# Patient Record
Sex: Female | Born: 1955
Health system: Southern US, Community
[De-identification: ages and names within clinical notes are randomized; demographics above are authoritative.]

## PROBLEM LIST (undated history)

## (undated) DIAGNOSIS — N950 Postmenopausal bleeding: Secondary | ICD-10-CM

## (undated) DIAGNOSIS — J309 Allergic rhinitis, unspecified: Secondary | ICD-10-CM

## (undated) DIAGNOSIS — I10 Essential (primary) hypertension: Secondary | ICD-10-CM

## (undated) DIAGNOSIS — M5137 Other intervertebral disc degeneration, lumbosacral region: Secondary | ICD-10-CM

## (undated) DIAGNOSIS — D649 Anemia, unspecified: Secondary | ICD-10-CM

## (undated) DIAGNOSIS — R112 Nausea with vomiting, unspecified: Secondary | ICD-10-CM

## (undated) DIAGNOSIS — M51379 Other intervertebral disc degeneration, lumbosacral region without mention of lumbar back pain or lower extremity pain: Secondary | ICD-10-CM

## (undated) DIAGNOSIS — F32A Depression, unspecified: Secondary | ICD-10-CM

## (undated) DIAGNOSIS — F329 Major depressive disorder, single episode, unspecified: Secondary | ICD-10-CM

## (undated) DIAGNOSIS — R9431 Abnormal electrocardiogram [ECG] [EKG]: Secondary | ICD-10-CM

## (undated) DIAGNOSIS — C541 Malignant neoplasm of endometrium: Secondary | ICD-10-CM

## (undated) DIAGNOSIS — J45909 Unspecified asthma, uncomplicated: Secondary | ICD-10-CM

## (undated) DIAGNOSIS — H409 Unspecified glaucoma: Secondary | ICD-10-CM

## (undated) DIAGNOSIS — E785 Hyperlipidemia, unspecified: Secondary | ICD-10-CM

## (undated) DIAGNOSIS — M5412 Radiculopathy, cervical region: Secondary | ICD-10-CM

## (undated) DIAGNOSIS — K219 Gastro-esophageal reflux disease without esophagitis: Secondary | ICD-10-CM

## (undated) DIAGNOSIS — Z9889 Other specified postprocedural states: Secondary | ICD-10-CM

## (undated) DIAGNOSIS — M549 Dorsalgia, unspecified: Secondary | ICD-10-CM

## (undated) HISTORY — DX: Essential (primary) hypertension: I10

## (undated) HISTORY — DX: Depression, unspecified: F32.A

## (undated) HISTORY — PX: KNEE ARTHROSCOPY: SUR90

## (undated) HISTORY — PX: ANKLE SURGERY: SHX546

## (undated) HISTORY — PX: CERVICAL SPINE SURGERY: SHX589

## (undated) HISTORY — DX: Major depressive disorder, single episode, unspecified: F32.9

## (undated) HISTORY — DX: Anemia, unspecified: D64.9

## (undated) HISTORY — PX: BIOPSY BREAST: PRO8

## (undated) HISTORY — DX: Abnormal electrocardiogram (ECG) (EKG): R94.31

## (undated) HISTORY — PX: OTHER SURGICAL HISTORY: SHX169

## (undated) HISTORY — DX: Unspecified glaucoma: H40.9

## (undated) HISTORY — DX: Hyperlipidemia, unspecified: E78.5

## (undated) HISTORY — DX: Dorsalgia, unspecified: M54.9

## (undated) HISTORY — DX: Gastro-esophageal reflux disease without esophagitis: K21.9

## (undated) HISTORY — DX: Allergic rhinitis, unspecified: J30.9

## (undated) HISTORY — DX: Unspecified asthma, uncomplicated: J45.909

## (undated) HISTORY — PX: BREAST CYST INCISION AND DRAINAGE: SHX14

## (undated) HISTORY — DX: Radiculopathy, cervical region: M54.12

## (undated) HISTORY — DX: Postmenopausal bleeding: N95.0

## (undated) HISTORY — PX: BUNIONECTOMY: SHX129

---

## 1978-12-20 HISTORY — PX: CHOLECYSTECTOMY: SHX55

## 2001-04-04 ENCOUNTER — Other Ambulatory Visit: Admission: RE | Admit: 2001-04-04 | Discharge: 2001-04-04 | Payer: Self-pay | Admitting: Obstetrics and Gynecology

## 2001-09-28 ENCOUNTER — Emergency Department (HOSPITAL_COMMUNITY): Admission: EM | Admit: 2001-09-28 | Discharge: 2001-09-28 | Payer: Self-pay | Admitting: Emergency Medicine

## 2002-03-27 ENCOUNTER — Encounter: Payer: Self-pay | Admitting: Obstetrics and Gynecology

## 2002-03-27 ENCOUNTER — Inpatient Hospital Stay (HOSPITAL_COMMUNITY): Admission: AD | Admit: 2002-03-27 | Discharge: 2002-03-28 | Payer: Self-pay | Admitting: Obstetrics and Gynecology

## 2002-03-31 ENCOUNTER — Inpatient Hospital Stay (HOSPITAL_COMMUNITY): Admission: AD | Admit: 2002-03-31 | Discharge: 2002-03-31 | Payer: Self-pay | Admitting: Obstetrics and Gynecology

## 2002-03-31 ENCOUNTER — Emergency Department (HOSPITAL_COMMUNITY): Admission: EM | Admit: 2002-03-31 | Discharge: 2002-03-31 | Payer: Self-pay | Admitting: Emergency Medicine

## 2002-11-02 ENCOUNTER — Other Ambulatory Visit: Admission: RE | Admit: 2002-11-02 | Discharge: 2002-11-02 | Payer: Self-pay | Admitting: Obstetrics and Gynecology

## 2003-01-10 ENCOUNTER — Emergency Department (HOSPITAL_COMMUNITY): Admission: EM | Admit: 2003-01-10 | Discharge: 2003-01-10 | Payer: Self-pay | Admitting: Emergency Medicine

## 2003-11-16 ENCOUNTER — Emergency Department (HOSPITAL_COMMUNITY): Admission: EM | Admit: 2003-11-16 | Discharge: 2003-11-16 | Payer: Self-pay | Admitting: Emergency Medicine

## 2003-12-27 ENCOUNTER — Other Ambulatory Visit: Admission: RE | Admit: 2003-12-27 | Discharge: 2003-12-27 | Payer: Self-pay | Admitting: Obstetrics and Gynecology

## 2004-01-29 ENCOUNTER — Encounter: Admission: RE | Admit: 2004-01-29 | Discharge: 2004-01-29 | Payer: Self-pay | Admitting: General Surgery

## 2004-01-30 ENCOUNTER — Encounter (INDEPENDENT_AMBULATORY_CARE_PROVIDER_SITE_OTHER): Payer: Self-pay | Admitting: Specialist

## 2004-01-30 ENCOUNTER — Ambulatory Visit (HOSPITAL_BASED_OUTPATIENT_CLINIC_OR_DEPARTMENT_OTHER): Admission: RE | Admit: 2004-01-30 | Discharge: 2004-01-30 | Payer: Self-pay | Admitting: General Surgery

## 2004-01-30 ENCOUNTER — Ambulatory Visit (HOSPITAL_COMMUNITY): Admission: RE | Admit: 2004-01-30 | Discharge: 2004-01-30 | Payer: Self-pay | Admitting: General Surgery

## 2004-09-23 ENCOUNTER — Ambulatory Visit (HOSPITAL_COMMUNITY): Admission: RE | Admit: 2004-09-23 | Discharge: 2004-09-23 | Payer: Self-pay | Admitting: Orthopedic Surgery

## 2004-10-20 ENCOUNTER — Ambulatory Visit: Payer: Self-pay | Admitting: Family Medicine

## 2004-12-11 ENCOUNTER — Ambulatory Visit: Payer: Self-pay | Admitting: Sports Medicine

## 2004-12-18 ENCOUNTER — Ambulatory Visit: Payer: Self-pay | Admitting: Family Medicine

## 2005-02-24 ENCOUNTER — Ambulatory Visit: Payer: Self-pay | Admitting: Family Medicine

## 2005-03-05 ENCOUNTER — Other Ambulatory Visit: Admission: RE | Admit: 2005-03-05 | Discharge: 2005-03-05 | Payer: Self-pay | Admitting: Obstetrics and Gynecology

## 2005-04-19 ENCOUNTER — Ambulatory Visit: Payer: Self-pay | Admitting: Sports Medicine

## 2005-04-25 ENCOUNTER — Emergency Department (HOSPITAL_COMMUNITY): Admission: EM | Admit: 2005-04-25 | Discharge: 2005-04-26 | Payer: Self-pay | Admitting: Emergency Medicine

## 2005-04-28 ENCOUNTER — Ambulatory Visit (HOSPITAL_COMMUNITY): Admission: RE | Admit: 2005-04-28 | Discharge: 2005-04-28 | Payer: Self-pay | Admitting: Gastroenterology

## 2005-04-28 ENCOUNTER — Encounter (INDEPENDENT_AMBULATORY_CARE_PROVIDER_SITE_OTHER): Payer: Self-pay | Admitting: Specialist

## 2005-05-26 ENCOUNTER — Ambulatory Visit: Payer: Self-pay | Admitting: Family Medicine

## 2005-07-20 ENCOUNTER — Ambulatory Visit: Payer: Self-pay | Admitting: Family Medicine

## 2005-10-25 ENCOUNTER — Ambulatory Visit: Payer: Self-pay | Admitting: Sports Medicine

## 2005-10-28 ENCOUNTER — Ambulatory Visit: Payer: Self-pay | Admitting: Family Medicine

## 2006-06-07 ENCOUNTER — Ambulatory Visit: Payer: Self-pay | Admitting: Sports Medicine

## 2006-06-16 ENCOUNTER — Ambulatory Visit (HOSPITAL_BASED_OUTPATIENT_CLINIC_OR_DEPARTMENT_OTHER): Admission: RE | Admit: 2006-06-16 | Discharge: 2006-06-16 | Payer: Self-pay | Admitting: Orthopedic Surgery

## 2006-07-18 ENCOUNTER — Other Ambulatory Visit: Admission: RE | Admit: 2006-07-18 | Discharge: 2006-07-18 | Payer: Self-pay | Admitting: Obstetrics and Gynecology

## 2006-07-20 ENCOUNTER — Encounter: Admission: RE | Admit: 2006-07-20 | Discharge: 2006-07-20 | Payer: Self-pay | Admitting: Obstetrics and Gynecology

## 2006-08-08 ENCOUNTER — Emergency Department (HOSPITAL_COMMUNITY): Admission: EM | Admit: 2006-08-08 | Discharge: 2006-08-09 | Payer: Self-pay | Admitting: Emergency Medicine

## 2006-08-12 ENCOUNTER — Ambulatory Visit: Payer: Self-pay | Admitting: Family Medicine

## 2006-08-20 ENCOUNTER — Encounter (INDEPENDENT_AMBULATORY_CARE_PROVIDER_SITE_OTHER): Payer: Self-pay | Admitting: *Deleted

## 2006-08-20 LAB — CONVERTED CEMR LAB

## 2006-09-02 ENCOUNTER — Ambulatory Visit: Payer: Self-pay | Admitting: Family Medicine

## 2006-09-06 ENCOUNTER — Ambulatory Visit: Payer: Self-pay | Admitting: Sports Medicine

## 2006-09-16 ENCOUNTER — Ambulatory Visit: Payer: Self-pay | Admitting: Family Medicine

## 2006-12-05 ENCOUNTER — Ambulatory Visit: Payer: Self-pay | Admitting: Family Medicine

## 2006-12-07 ENCOUNTER — Ambulatory Visit: Payer: Self-pay | Admitting: Family Medicine

## 2006-12-07 ENCOUNTER — Encounter: Admission: RE | Admit: 2006-12-07 | Discharge: 2006-12-07 | Payer: Self-pay | Admitting: *Deleted

## 2006-12-10 ENCOUNTER — Emergency Department (HOSPITAL_COMMUNITY): Admission: EM | Admit: 2006-12-10 | Discharge: 2006-12-10 | Payer: Self-pay | Admitting: Emergency Medicine

## 2006-12-15 ENCOUNTER — Ambulatory Visit: Payer: Self-pay | Admitting: Family Medicine

## 2006-12-22 ENCOUNTER — Ambulatory Visit: Payer: Self-pay | Admitting: Family Medicine

## 2006-12-30 ENCOUNTER — Ambulatory Visit: Payer: Self-pay | Admitting: Family Medicine

## 2007-02-16 DIAGNOSIS — J45909 Unspecified asthma, uncomplicated: Secondary | ICD-10-CM | POA: Insufficient documentation

## 2007-02-16 DIAGNOSIS — D649 Anemia, unspecified: Secondary | ICD-10-CM

## 2007-02-16 DIAGNOSIS — I10 Essential (primary) hypertension: Secondary | ICD-10-CM | POA: Insufficient documentation

## 2007-02-16 DIAGNOSIS — E785 Hyperlipidemia, unspecified: Secondary | ICD-10-CM

## 2007-02-16 DIAGNOSIS — E669 Obesity, unspecified: Secondary | ICD-10-CM | POA: Insufficient documentation

## 2007-02-16 HISTORY — DX: Unspecified asthma, uncomplicated: J45.909

## 2007-02-17 ENCOUNTER — Encounter (INDEPENDENT_AMBULATORY_CARE_PROVIDER_SITE_OTHER): Payer: Self-pay | Admitting: *Deleted

## 2007-03-22 ENCOUNTER — Ambulatory Visit: Payer: Self-pay | Admitting: Family Medicine

## 2007-03-22 ENCOUNTER — Encounter (INDEPENDENT_AMBULATORY_CARE_PROVIDER_SITE_OTHER): Payer: Self-pay | Admitting: *Deleted

## 2007-03-22 LAB — CONVERTED CEMR LAB
BUN: 20 mg/dL (ref 6–23)
CO2: 26 meq/L (ref 19–32)
Calcium: 9.5 mg/dL (ref 8.4–10.5)
Chloride: 105 meq/L (ref 96–112)
Creatinine, Ser: 1.01 mg/dL (ref 0.40–1.20)
Glucose, Bld: 100 mg/dL — ABNORMAL HIGH (ref 70–99)
Potassium: 4.1 meq/L (ref 3.5–5.3)
Sodium: 142 meq/L (ref 135–145)

## 2007-04-23 ENCOUNTER — Encounter: Admission: RE | Admit: 2007-04-23 | Discharge: 2007-04-23 | Payer: Self-pay | Admitting: Orthopedic Surgery

## 2007-04-26 ENCOUNTER — Encounter (INDEPENDENT_AMBULATORY_CARE_PROVIDER_SITE_OTHER): Payer: Self-pay | Admitting: *Deleted

## 2007-05-08 ENCOUNTER — Encounter
Admission: RE | Admit: 2007-05-08 | Discharge: 2007-07-21 | Payer: Self-pay | Admitting: Physical Medicine and Rehabilitation

## 2007-06-16 ENCOUNTER — Ambulatory Visit (HOSPITAL_COMMUNITY): Admission: RE | Admit: 2007-06-16 | Discharge: 2007-06-16 | Payer: Self-pay | Admitting: Family Medicine

## 2007-06-16 ENCOUNTER — Ambulatory Visit: Payer: Self-pay | Admitting: Family Medicine

## 2007-06-21 ENCOUNTER — Telehealth: Payer: Self-pay | Admitting: *Deleted

## 2007-06-22 ENCOUNTER — Encounter: Admission: RE | Admit: 2007-06-22 | Discharge: 2007-06-22 | Payer: Self-pay | Admitting: Family Medicine

## 2007-06-22 ENCOUNTER — Telehealth (INDEPENDENT_AMBULATORY_CARE_PROVIDER_SITE_OTHER): Payer: Self-pay | Admitting: Family Medicine

## 2007-06-22 ENCOUNTER — Ambulatory Visit: Payer: Self-pay | Admitting: Family Medicine

## 2007-06-22 ENCOUNTER — Encounter (INDEPENDENT_AMBULATORY_CARE_PROVIDER_SITE_OTHER): Payer: Self-pay | Admitting: *Deleted

## 2007-06-25 ENCOUNTER — Encounter (INDEPENDENT_AMBULATORY_CARE_PROVIDER_SITE_OTHER): Payer: Self-pay | Admitting: Family Medicine

## 2007-06-26 ENCOUNTER — Telehealth (INDEPENDENT_AMBULATORY_CARE_PROVIDER_SITE_OTHER): Payer: Self-pay | Admitting: *Deleted

## 2007-07-24 ENCOUNTER — Ambulatory Visit: Payer: Self-pay | Admitting: Sports Medicine

## 2007-07-24 ENCOUNTER — Telehealth: Payer: Self-pay | Admitting: *Deleted

## 2007-08-21 LAB — CONVERTED CEMR LAB: Pap Smear: NORMAL

## 2007-10-23 ENCOUNTER — Telehealth (INDEPENDENT_AMBULATORY_CARE_PROVIDER_SITE_OTHER): Payer: Self-pay | Admitting: *Deleted

## 2007-10-30 ENCOUNTER — Ambulatory Visit (HOSPITAL_COMMUNITY): Admission: RE | Admit: 2007-10-30 | Discharge: 2007-10-30 | Payer: Self-pay | Admitting: Family Medicine

## 2007-10-30 ENCOUNTER — Ambulatory Visit: Payer: Self-pay | Admitting: Sports Medicine

## 2007-11-01 ENCOUNTER — Encounter (INDEPENDENT_AMBULATORY_CARE_PROVIDER_SITE_OTHER): Payer: Self-pay | Admitting: *Deleted

## 2007-11-02 ENCOUNTER — Ambulatory Visit: Payer: Self-pay | Admitting: Family Medicine

## 2007-11-02 ENCOUNTER — Encounter (INDEPENDENT_AMBULATORY_CARE_PROVIDER_SITE_OTHER): Payer: Self-pay | Admitting: *Deleted

## 2007-11-02 LAB — CONVERTED CEMR LAB
BUN: 18 mg/dL (ref 6–23)
CO2: 25 meq/L (ref 19–32)
Calcium: 9.8 mg/dL (ref 8.4–10.5)
Chloride: 102 meq/L (ref 96–112)
Creatinine, Ser: 1.13 mg/dL (ref 0.40–1.20)
Direct LDL: 117 mg/dL — ABNORMAL HIGH
Glucose, Bld: 89 mg/dL (ref 70–99)
HCT: 40.8 % (ref 36.0–46.0)
Hemoglobin: 12.1 g/dL (ref 12.0–15.0)
MCHC: 29.7 g/dL — ABNORMAL LOW (ref 30.0–36.0)
MCV: 87.6 fL (ref 78.0–100.0)
Platelets: 342 10*3/uL (ref 150–400)
Potassium: 4 meq/L (ref 3.5–5.3)
RBC: 4.66 M/uL (ref 3.87–5.11)
RDW: 14.6 % (ref 11.5–15.5)
Sodium: 138 meq/L (ref 135–145)
TSH: 1.997 microintl units/mL (ref 0.350–5.50)
WBC: 6.3 10*3/uL (ref 4.0–10.5)

## 2007-11-06 ENCOUNTER — Encounter (INDEPENDENT_AMBULATORY_CARE_PROVIDER_SITE_OTHER): Payer: Self-pay | Admitting: *Deleted

## 2007-11-30 ENCOUNTER — Ambulatory Visit: Payer: Self-pay | Admitting: Family Medicine

## 2007-12-08 ENCOUNTER — Ambulatory Visit: Payer: Self-pay | Admitting: Family Medicine

## 2007-12-21 HISTORY — PX: CARPAL TUNNEL RELEASE: SHX101

## 2007-12-29 ENCOUNTER — Encounter: Admission: RE | Admit: 2007-12-29 | Discharge: 2007-12-29 | Payer: Self-pay | Admitting: Family Medicine

## 2007-12-29 ENCOUNTER — Ambulatory Visit: Payer: Self-pay | Admitting: Family Medicine

## 2007-12-29 ENCOUNTER — Telehealth (INDEPENDENT_AMBULATORY_CARE_PROVIDER_SITE_OTHER): Payer: Self-pay | Admitting: *Deleted

## 2007-12-29 LAB — CONVERTED CEMR LAB: Rapid Strep: NEGATIVE

## 2007-12-30 ENCOUNTER — Encounter: Payer: Self-pay | Admitting: Family Medicine

## 2007-12-30 ENCOUNTER — Ambulatory Visit (HOSPITAL_BASED_OUTPATIENT_CLINIC_OR_DEPARTMENT_OTHER): Admission: RE | Admit: 2007-12-30 | Discharge: 2007-12-30 | Payer: Self-pay | Admitting: *Deleted

## 2007-12-30 ENCOUNTER — Encounter (INDEPENDENT_AMBULATORY_CARE_PROVIDER_SITE_OTHER): Payer: Self-pay | Admitting: *Deleted

## 2008-01-01 ENCOUNTER — Encounter: Payer: Self-pay | Admitting: *Deleted

## 2008-01-03 ENCOUNTER — Telehealth (INDEPENDENT_AMBULATORY_CARE_PROVIDER_SITE_OTHER): Payer: Self-pay | Admitting: *Deleted

## 2008-01-03 ENCOUNTER — Encounter (INDEPENDENT_AMBULATORY_CARE_PROVIDER_SITE_OTHER): Payer: Self-pay | Admitting: *Deleted

## 2008-01-07 ENCOUNTER — Ambulatory Visit: Payer: Self-pay | Admitting: Internal Medicine

## 2008-01-11 ENCOUNTER — Ambulatory Visit: Payer: Self-pay | Admitting: Family Medicine

## 2008-01-18 ENCOUNTER — Telehealth: Payer: Self-pay | Admitting: *Deleted

## 2008-01-24 ENCOUNTER — Ambulatory Visit: Payer: Self-pay | Admitting: Family Medicine

## 2008-02-03 ENCOUNTER — Encounter (INDEPENDENT_AMBULATORY_CARE_PROVIDER_SITE_OTHER): Payer: Self-pay | Admitting: *Deleted

## 2008-02-09 ENCOUNTER — Encounter (INDEPENDENT_AMBULATORY_CARE_PROVIDER_SITE_OTHER): Payer: Self-pay | Admitting: *Deleted

## 2008-02-12 ENCOUNTER — Ambulatory Visit: Payer: Self-pay | Admitting: Sports Medicine

## 2008-03-08 ENCOUNTER — Telehealth (INDEPENDENT_AMBULATORY_CARE_PROVIDER_SITE_OTHER): Payer: Self-pay | Admitting: *Deleted

## 2008-03-12 ENCOUNTER — Ambulatory Visit: Payer: Self-pay | Admitting: Family Medicine

## 2008-03-12 ENCOUNTER — Telehealth (INDEPENDENT_AMBULATORY_CARE_PROVIDER_SITE_OTHER): Payer: Self-pay | Admitting: *Deleted

## 2008-03-23 ENCOUNTER — Encounter: Payer: Self-pay | Admitting: Family Medicine

## 2008-03-23 ENCOUNTER — Ambulatory Visit (HOSPITAL_COMMUNITY): Admission: RE | Admit: 2008-03-23 | Discharge: 2008-03-23 | Payer: Self-pay | Admitting: Orthopedic Surgery

## 2008-04-04 ENCOUNTER — Ambulatory Visit: Payer: Self-pay | Admitting: Family Medicine

## 2008-04-15 ENCOUNTER — Emergency Department (HOSPITAL_COMMUNITY): Admission: EM | Admit: 2008-04-15 | Discharge: 2008-04-15 | Payer: Self-pay | Admitting: Emergency Medicine

## 2008-04-15 ENCOUNTER — Telehealth: Payer: Self-pay | Admitting: *Deleted

## 2008-04-17 ENCOUNTER — Telehealth: Payer: Self-pay | Admitting: *Deleted

## 2008-05-07 ENCOUNTER — Ambulatory Visit: Payer: Self-pay | Admitting: Family Medicine

## 2008-05-24 ENCOUNTER — Ambulatory Visit: Payer: Self-pay | Admitting: Family Medicine

## 2008-05-24 ENCOUNTER — Encounter (INDEPENDENT_AMBULATORY_CARE_PROVIDER_SITE_OTHER): Payer: Self-pay | Admitting: *Deleted

## 2008-05-28 ENCOUNTER — Encounter (INDEPENDENT_AMBULATORY_CARE_PROVIDER_SITE_OTHER): Payer: Self-pay | Admitting: *Deleted

## 2008-05-28 ENCOUNTER — Ambulatory Visit: Payer: Self-pay | Admitting: Family Medicine

## 2008-05-28 LAB — CONVERTED CEMR LAB
ALT: 14 units/L (ref 0–35)
AST: 16 units/L (ref 0–37)
Albumin: 4 g/dL (ref 3.5–5.2)
Alkaline Phosphatase: 82 units/L (ref 39–117)
BUN: 17 mg/dL (ref 6–23)
CO2: 26 meq/L (ref 19–32)
Calcium: 9.3 mg/dL (ref 8.4–10.5)
Chloride: 106 meq/L (ref 96–112)
Cholesterol: 209 mg/dL — ABNORMAL HIGH (ref 0–200)
Creatinine, Ser: 0.97 mg/dL (ref 0.40–1.20)
Glucose, Bld: 113 mg/dL — ABNORMAL HIGH (ref 70–99)
HCT: 42 % (ref 36.0–46.0)
HDL: 39 mg/dL — ABNORMAL LOW (ref >39)
Hemoglobin: 12.7 g/dL (ref 12.0–15.0)
LDL Cholesterol: 141 mg/dL — ABNORMAL HIGH (ref 0–99)
MCHC: 30.2 g/dL (ref 30.0–36.0)
MCV: 88.2 fL (ref 78.0–100.0)
Platelets: 342 10*3/uL (ref 150–400)
Potassium: 4.5 meq/L (ref 3.5–5.3)
RBC: 4.76 M/uL (ref 3.87–5.11)
RDW: 15.1 % (ref 11.5–15.5)
Sodium: 144 meq/L (ref 135–145)
Total Bilirubin: 0.3 mg/dL (ref 0.3–1.2)
Total CHOL/HDL Ratio: 5.4
Total Protein: 7.3 g/dL (ref 6.0–8.3)
Triglycerides: 145 mg/dL (ref <150)
VLDL: 29 mg/dL (ref 0–40)
WBC: 5.2 10*3/uL (ref 4.0–10.5)

## 2008-05-29 ENCOUNTER — Encounter (INDEPENDENT_AMBULATORY_CARE_PROVIDER_SITE_OTHER): Payer: Self-pay | Admitting: *Deleted

## 2008-06-04 ENCOUNTER — Telehealth: Payer: Self-pay | Admitting: *Deleted

## 2008-06-11 ENCOUNTER — Encounter (INDEPENDENT_AMBULATORY_CARE_PROVIDER_SITE_OTHER): Payer: Self-pay | Admitting: *Deleted

## 2008-06-11 ENCOUNTER — Ambulatory Visit: Payer: Self-pay | Admitting: Family Medicine

## 2008-06-12 LAB — CONVERTED CEMR LAB
BUN: 20 mg/dL (ref 6–23)
CO2: 22 meq/L (ref 19–32)
Calcium: 9.6 mg/dL (ref 8.4–10.5)
Chloride: 104 meq/L (ref 96–112)
Creatinine, Ser: 1.24 mg/dL — ABNORMAL HIGH (ref 0.40–1.20)
Glucose, Bld: 115 mg/dL — ABNORMAL HIGH (ref 70–99)
Potassium: 4.6 meq/L (ref 3.5–5.3)
Sodium: 142 meq/L (ref 135–145)

## 2008-06-18 ENCOUNTER — Ambulatory Visit: Payer: Self-pay | Admitting: Family Medicine

## 2008-07-01 ENCOUNTER — Encounter: Payer: Self-pay | Admitting: Family Medicine

## 2008-07-01 ENCOUNTER — Ambulatory Visit: Payer: Self-pay | Admitting: Sports Medicine

## 2008-07-01 LAB — CONVERTED CEMR LAB
ALT: 14 units/L (ref 0–35)
AST: 13 units/L (ref 0–37)
Albumin: 3.8 g/dL (ref 3.5–5.2)
Alkaline Phosphatase: 75 units/L (ref 39–117)
BUN: 11 mg/dL (ref 6–23)
CO2: 27 meq/L (ref 19–32)
Calcium: 8.8 mg/dL (ref 8.4–10.5)
Chloride: 102 meq/L (ref 96–112)
Cholesterol, target level: 200 mg/dL
Creatinine, Ser: 0.98 mg/dL (ref 0.40–1.20)
Glucose, Bld: 106 mg/dL — ABNORMAL HIGH (ref 70–99)
HDL goal, serum: 40 mg/dL
LDL Goal: 130 mg/dL
Potassium: 4.3 meq/L (ref 3.5–5.3)
Sodium: 139 meq/L (ref 135–145)
Total Bilirubin: 0.4 mg/dL (ref 0.3–1.2)
Total Protein: 6.7 g/dL (ref 6.0–8.3)

## 2008-07-03 ENCOUNTER — Telehealth: Payer: Self-pay | Admitting: Family Medicine

## 2008-07-09 ENCOUNTER — Telehealth: Payer: Self-pay | Admitting: Family Medicine

## 2008-07-16 ENCOUNTER — Telehealth: Payer: Self-pay | Admitting: *Deleted

## 2008-07-19 ENCOUNTER — Ambulatory Visit: Payer: Self-pay | Admitting: Internal Medicine

## 2008-07-19 ENCOUNTER — Ambulatory Visit: Payer: Self-pay | Admitting: Family Medicine

## 2008-07-22 ENCOUNTER — Telehealth: Payer: Self-pay | Admitting: Family Medicine

## 2008-08-08 ENCOUNTER — Ambulatory Visit: Payer: Self-pay | Admitting: Family Medicine

## 2008-08-13 ENCOUNTER — Ambulatory Visit: Payer: Self-pay | Admitting: Family Medicine

## 2008-08-20 ENCOUNTER — Telehealth: Payer: Self-pay | Admitting: Family Medicine

## 2008-08-20 ENCOUNTER — Telehealth: Payer: Self-pay | Admitting: *Deleted

## 2008-08-20 ENCOUNTER — Telehealth (INDEPENDENT_AMBULATORY_CARE_PROVIDER_SITE_OTHER): Payer: Self-pay | Admitting: *Deleted

## 2008-09-19 ENCOUNTER — Ambulatory Visit: Payer: Self-pay | Admitting: Family Medicine

## 2008-10-02 ENCOUNTER — Ambulatory Visit: Payer: Self-pay | Admitting: Family Medicine

## 2008-10-07 ENCOUNTER — Telehealth: Payer: Self-pay | Admitting: *Deleted

## 2008-10-16 ENCOUNTER — Ambulatory Visit: Payer: Self-pay | Admitting: Family Medicine

## 2008-10-16 ENCOUNTER — Telehealth: Payer: Self-pay | Admitting: *Deleted

## 2008-10-17 ENCOUNTER — Telehealth: Payer: Self-pay | Admitting: *Deleted

## 2008-10-22 ENCOUNTER — Encounter: Payer: Self-pay | Admitting: Family Medicine

## 2008-10-22 ENCOUNTER — Ambulatory Visit: Payer: Self-pay | Admitting: Family Medicine

## 2008-10-22 LAB — CONVERTED CEMR LAB
ALT: 15 units/L (ref 0–35)
AST: 15 units/L (ref 0–37)
Albumin: 3.8 g/dL (ref 3.5–5.2)
Alkaline Phosphatase: 79 units/L (ref 39–117)
BUN: 17 mg/dL (ref 6–23)
Bilirubin Urine: NEGATIVE
CO2: 27 meq/L (ref 19–32)
Calcium: 9.3 mg/dL (ref 8.4–10.5)
Chloride: 103 meq/L (ref 96–112)
Creatinine, Ser: 0.96 mg/dL (ref 0.40–1.20)
Glucose, Bld: 113 mg/dL — ABNORMAL HIGH (ref 70–99)
Glucose, Urine, Semiquant: NEGATIVE
Ketones, urine, test strip: NEGATIVE
Nitrite: NEGATIVE
Potassium: 3.8 meq/L (ref 3.5–5.3)
Protein, U semiquant: NEGATIVE
Sodium: 144 meq/L (ref 135–145)
Specific Gravity, Urine: 1.015
Total Bilirubin: 0.3 mg/dL (ref 0.3–1.2)
Total Protein: 7.1 g/dL (ref 6.0–8.3)
Urobilinogen, UA: 0.2
WBC Urine, dipstick: NEGATIVE
pH: 5.5

## 2008-10-23 ENCOUNTER — Telehealth: Payer: Self-pay | Admitting: Family Medicine

## 2008-10-23 ENCOUNTER — Encounter: Payer: Self-pay | Admitting: Family Medicine

## 2008-10-24 ENCOUNTER — Telehealth: Payer: Self-pay | Admitting: Family Medicine

## 2008-10-30 ENCOUNTER — Ambulatory Visit: Payer: Self-pay | Admitting: Family Medicine

## 2008-11-05 ENCOUNTER — Telehealth (INDEPENDENT_AMBULATORY_CARE_PROVIDER_SITE_OTHER): Payer: Self-pay | Admitting: *Deleted

## 2008-11-06 ENCOUNTER — Ambulatory Visit: Payer: Self-pay | Admitting: Family Medicine

## 2008-11-06 LAB — CONVERTED CEMR LAB: Rapid Strep: NEGATIVE

## 2008-11-11 ENCOUNTER — Telehealth: Payer: Self-pay | Admitting: Family Medicine

## 2008-11-22 ENCOUNTER — Ambulatory Visit: Payer: Self-pay | Admitting: Family Medicine

## 2008-12-25 ENCOUNTER — Telehealth (INDEPENDENT_AMBULATORY_CARE_PROVIDER_SITE_OTHER): Payer: Self-pay | Admitting: Family Medicine

## 2008-12-26 ENCOUNTER — Telehealth (INDEPENDENT_AMBULATORY_CARE_PROVIDER_SITE_OTHER): Payer: Self-pay | Admitting: Family Medicine

## 2009-01-24 ENCOUNTER — Telehealth: Payer: Self-pay | Admitting: Family Medicine

## 2009-02-17 ENCOUNTER — Telehealth: Payer: Self-pay | Admitting: Family Medicine

## 2009-02-19 ENCOUNTER — Telehealth: Payer: Self-pay | Admitting: *Deleted

## 2009-02-26 ENCOUNTER — Ambulatory Visit: Payer: Self-pay | Admitting: Family Medicine

## 2009-02-26 ENCOUNTER — Encounter: Payer: Self-pay | Admitting: Family Medicine

## 2009-02-26 LAB — CONVERTED CEMR LAB
ALT: 14 units/L (ref 0–35)
CO2: 27 meq/L (ref 19–32)
Calcium: 9.4 mg/dL (ref 8.4–10.5)
Chloride: 102 meq/L (ref 96–112)
Sodium: 141 meq/L (ref 135–145)
Total Protein: 7.1 g/dL (ref 6.0–8.3)

## 2009-03-07 ENCOUNTER — Telehealth: Payer: Self-pay | Admitting: Family Medicine

## 2009-03-19 ENCOUNTER — Telehealth: Payer: Self-pay | Admitting: Family Medicine

## 2009-03-25 ENCOUNTER — Ambulatory Visit: Payer: Self-pay | Admitting: Family Medicine

## 2009-03-25 DIAGNOSIS — F3289 Other specified depressive episodes: Secondary | ICD-10-CM

## 2009-03-25 DIAGNOSIS — F32A Depression, unspecified: Secondary | ICD-10-CM | POA: Insufficient documentation

## 2009-03-25 DIAGNOSIS — G4700 Insomnia, unspecified: Secondary | ICD-10-CM | POA: Insufficient documentation

## 2009-03-25 DIAGNOSIS — M549 Dorsalgia, unspecified: Secondary | ICD-10-CM

## 2009-03-25 DIAGNOSIS — F329 Major depressive disorder, single episode, unspecified: Secondary | ICD-10-CM

## 2009-03-25 HISTORY — DX: Other specified depressive episodes: F32.89

## 2009-03-25 HISTORY — DX: Major depressive disorder, single episode, unspecified: F32.9

## 2009-03-25 HISTORY — DX: Dorsalgia, unspecified: M54.9

## 2009-04-18 ENCOUNTER — Telehealth: Payer: Self-pay | Admitting: *Deleted

## 2009-04-25 ENCOUNTER — Ambulatory Visit: Payer: Self-pay | Admitting: Family Medicine

## 2009-05-12 ENCOUNTER — Ambulatory Visit: Payer: Self-pay | Admitting: Family Medicine

## 2009-05-21 ENCOUNTER — Telehealth: Payer: Self-pay | Admitting: *Deleted

## 2009-05-26 ENCOUNTER — Ambulatory Visit: Payer: Self-pay | Admitting: Family Medicine

## 2009-07-18 ENCOUNTER — Telehealth: Payer: Self-pay | Admitting: Family Medicine

## 2009-08-08 ENCOUNTER — Encounter: Payer: Self-pay | Admitting: Family Medicine

## 2009-08-13 ENCOUNTER — Encounter: Payer: Self-pay | Admitting: Family Medicine

## 2009-08-15 ENCOUNTER — Encounter: Admission: RE | Admit: 2009-08-15 | Discharge: 2009-08-15 | Payer: Self-pay | Admitting: Neurosurgery

## 2009-08-26 ENCOUNTER — Encounter: Payer: Self-pay | Admitting: Family Medicine

## 2009-09-01 ENCOUNTER — Ambulatory Visit: Payer: Self-pay | Admitting: Family Medicine

## 2009-09-01 ENCOUNTER — Encounter: Payer: Self-pay | Admitting: Family Medicine

## 2009-09-01 ENCOUNTER — Ambulatory Visit (HOSPITAL_COMMUNITY): Admission: RE | Admit: 2009-09-01 | Discharge: 2009-09-01 | Payer: Self-pay | Admitting: Family Medicine

## 2009-09-01 LAB — CONVERTED CEMR LAB
CO2: 28 meq/L (ref 19–32)
Calcium: 9 mg/dL (ref 8.4–10.5)
Chloride: 105 meq/L (ref 96–112)
MCV: 87.5 fL (ref 78.0–100.0)
Platelets: 342 10*3/uL (ref 150–400)
Sodium: 144 meq/L (ref 135–145)
WBC: 7.1 10*3/uL (ref 4.0–10.5)

## 2009-09-05 ENCOUNTER — Encounter: Payer: Self-pay | Admitting: Family Medicine

## 2009-09-17 ENCOUNTER — Inpatient Hospital Stay (HOSPITAL_COMMUNITY): Admission: RE | Admit: 2009-09-17 | Discharge: 2009-09-22 | Payer: Self-pay | Admitting: *Deleted

## 2009-10-14 ENCOUNTER — Telehealth: Payer: Self-pay | Admitting: Family Medicine

## 2009-10-16 ENCOUNTER — Encounter: Payer: Self-pay | Admitting: Family Medicine

## 2009-10-16 DIAGNOSIS — Z9889 Other specified postprocedural states: Secondary | ICD-10-CM

## 2009-10-16 DIAGNOSIS — G959 Disease of spinal cord, unspecified: Secondary | ICD-10-CM | POA: Insufficient documentation

## 2009-10-16 DIAGNOSIS — M5412 Radiculopathy, cervical region: Secondary | ICD-10-CM | POA: Insufficient documentation

## 2009-10-16 HISTORY — DX: Radiculopathy, cervical region: M54.12

## 2009-10-21 ENCOUNTER — Ambulatory Visit: Payer: Self-pay | Admitting: Family Medicine

## 2009-10-28 ENCOUNTER — Encounter: Payer: Self-pay | Admitting: Family Medicine

## 2009-11-04 ENCOUNTER — Encounter: Admission: RE | Admit: 2009-11-04 | Discharge: 2009-11-27 | Payer: Self-pay | Admitting: *Deleted

## 2009-11-07 ENCOUNTER — Telehealth: Payer: Self-pay | Admitting: Family Medicine

## 2009-11-11 ENCOUNTER — Telehealth: Payer: Self-pay | Admitting: Family Medicine

## 2009-11-19 ENCOUNTER — Ambulatory Visit (HOSPITAL_COMMUNITY): Admission: RE | Admit: 2009-11-19 | Discharge: 2009-11-19 | Payer: Self-pay | Admitting: Gastroenterology

## 2009-11-21 ENCOUNTER — Telehealth: Payer: Self-pay | Admitting: Family Medicine

## 2009-11-26 ENCOUNTER — Inpatient Hospital Stay (HOSPITAL_COMMUNITY): Admission: RE | Admit: 2009-11-26 | Discharge: 2009-11-27 | Payer: Self-pay | Admitting: *Deleted

## 2009-11-27 ENCOUNTER — Telehealth: Payer: Self-pay | Admitting: Family Medicine

## 2010-01-07 ENCOUNTER — Ambulatory Visit: Payer: Self-pay | Admitting: Family Medicine

## 2010-01-07 ENCOUNTER — Encounter: Payer: Self-pay | Admitting: Family Medicine

## 2010-01-07 LAB — CONVERTED CEMR LAB
AST: 14 units/L (ref 0–37)
BUN: 15 mg/dL (ref 6–23)
Calcium: 8.9 mg/dL (ref 8.4–10.5)
Chloride: 103 meq/L (ref 96–112)
Cholesterol: 176 mg/dL (ref 0–200)
Creatinine, Ser: 0.96 mg/dL (ref 0.40–1.20)
Glucose, Bld: 99 mg/dL (ref 70–99)
HCT: 35.4 % — ABNORMAL LOW (ref 36.0–46.0)
HDL: 44 mg/dL (ref >39)
Hemoglobin: 11.1 g/dL — ABNORMAL LOW (ref 12.0–15.0)
RBC: 4.26 M/uL (ref 3.87–5.11)
RDW: 14.7 % (ref 11.5–15.5)
Total CHOL/HDL Ratio: 4
Triglycerides: 128 mg/dL (ref <150)

## 2010-01-09 ENCOUNTER — Encounter: Payer: Self-pay | Admitting: Family Medicine

## 2010-02-04 DIAGNOSIS — N63 Unspecified lump in unspecified breast: Secondary | ICD-10-CM

## 2010-02-25 ENCOUNTER — Encounter: Payer: Self-pay | Admitting: Family Medicine

## 2010-03-05 ENCOUNTER — Ambulatory Visit: Payer: Self-pay | Admitting: Family Medicine

## 2010-03-09 ENCOUNTER — Telehealth: Payer: Self-pay | Admitting: Family Medicine

## 2010-03-10 ENCOUNTER — Telehealth: Payer: Self-pay | Admitting: Family Medicine

## 2010-03-12 ENCOUNTER — Telehealth: Payer: Self-pay | Admitting: Family Medicine

## 2010-03-17 ENCOUNTER — Encounter: Payer: Self-pay | Admitting: Family Medicine

## 2010-03-17 ENCOUNTER — Telehealth (INDEPENDENT_AMBULATORY_CARE_PROVIDER_SITE_OTHER): Payer: Self-pay | Admitting: *Deleted

## 2010-04-13 ENCOUNTER — Telehealth: Payer: Self-pay | Admitting: Family Medicine

## 2010-05-05 ENCOUNTER — Encounter: Payer: Self-pay | Admitting: Family Medicine

## 2010-06-02 ENCOUNTER — Ambulatory Visit: Payer: Self-pay | Admitting: Family Medicine

## 2010-08-19 ENCOUNTER — Encounter: Payer: Self-pay | Admitting: Family Medicine

## 2010-09-25 ENCOUNTER — Telehealth: Payer: Self-pay | Admitting: Family Medicine

## 2010-10-05 ENCOUNTER — Ambulatory Visit: Payer: Self-pay | Admitting: Family Medicine

## 2010-10-07 ENCOUNTER — Telehealth: Payer: Self-pay | Admitting: Family Medicine

## 2010-10-14 ENCOUNTER — Ambulatory Visit: Payer: Self-pay | Admitting: Family Medicine

## 2010-10-14 ENCOUNTER — Telehealth (INDEPENDENT_AMBULATORY_CARE_PROVIDER_SITE_OTHER): Payer: Self-pay | Admitting: *Deleted

## 2010-10-14 DIAGNOSIS — J029 Acute pharyngitis, unspecified: Secondary | ICD-10-CM | POA: Insufficient documentation

## 2010-10-14 DIAGNOSIS — J309 Allergic rhinitis, unspecified: Secondary | ICD-10-CM

## 2010-10-14 HISTORY — DX: Allergic rhinitis, unspecified: J30.9

## 2010-10-14 LAB — CONVERTED CEMR LAB: Rapid Strep: NEGATIVE

## 2010-10-20 ENCOUNTER — Ambulatory Visit: Payer: Self-pay | Admitting: Family Medicine

## 2010-11-02 ENCOUNTER — Emergency Department (HOSPITAL_COMMUNITY): Admission: EM | Admit: 2010-11-02 | Discharge: 2010-11-02 | Payer: Self-pay | Admitting: Emergency Medicine

## 2010-11-02 ENCOUNTER — Telehealth: Payer: Self-pay | Admitting: Family Medicine

## 2010-12-23 ENCOUNTER — Encounter
Admission: RE | Admit: 2010-12-23 | Discharge: 2011-01-19 | Payer: Self-pay | Source: Home / Self Care | Attending: Orthopedic Surgery | Admitting: Orthopedic Surgery

## 2011-01-19 NOTE — Assessment & Plan Note (Signed)
Summary: f/u eo   Vital Signs:  Patient profile:   55 year old female Height:      66 inches Temp:     98.1 degrees F Pulse rate:   95 / minute BP sitting:   129 / 92  Vitals Entered By: Jone Baseman CMA (October 20, 2010 3:03 PM) CC: f/u cough  Is Patient Diabetic? No Pain Assessment Patient in pain? no        Primary Care Provider:  Bobby Rumpf  MD  CC:  f/u cough .  History of Present Illness: 1) Allergic rhinitis: Seen on 10/26 for c/o clear rhinorrhea, nasal congestion, cough (productive), pharyngitis - diagnosed with allergic rhinitis - given script for Zyrtec - unable to fill due to cost so has been taking Claritin. Continues to have a lot of nasal congestion. Continues to have cough and occasional shortness of breath and wheeze at night, but this is improving. Has used her inhaler (albuterol) 3 times total in past week. Also reports some nausea and myalgias at that time which are now improved. Had flu vaccine on 10/17. Has history of intermittent asthma - has not had to use inhaler in months. Has history of allergic rhinitis - has been on Zyrtec in the past.   ROS: Denies fever/chills, HA, dizziness, ear pain, CP, SOB, abdominal pain, D/C, LE edema, rash.   Med rec as below except Flonase (new medication today)   Habits & Providers  Alcohol-Tobacco-Diet     Tobacco Status: never  Exercise-Depression-Behavior     Have you felt down or hopeless? no     Have you felt little pleasure in things? no     Depression Counseling: not indicated; screening negative for depression  Current Medications (verified): 1)  Proair Hfa 108 (90 Base) Mcg/act Aers (Albuterol Sulfate) .... 2 Puffs Inhaled Q 4 Hrs As Needed Shortness of Breath. 2)  Hydrochlorothiazide 25 Mg  Tabs (Hydrochlorothiazide) .... Take 1 Tab By Mouth Every Morning 3)  Nexium 40 Mg Cpdr (Esomeprazole Magnesium) .... Take 1 Capsule By Mouth Once A Day 4)  Cyclobenzaprine Hcl 5 Mg  Tabs (Cyclobenzaprine Hcl)  .Marland Kitchen.. 1 By Mouth At Bedtime 5)  Lisinopril 10 Mg  Tabs (Lisinopril) .... Take 1 Tab By Mouth Daily 6)  Valium 5 Mg  Tabs (Diazepam) .Marland Kitchen.. 1 By Mouth Two Times A Day As Needed 7)  Alphagan P 0.1 % Soln (Brimonidine Tartrate) .Marland Kitchen.. 1 Gtt Each Eye Three Times A Day 8)  Xalatan 0.005 % Soln (Latanoprost) .Marland Kitchen.. 1 Gtt Each Eye Once A Day 9)  Tramadol Hcl 50 Mg Tabs (Tramadol Hcl) .... One Tab By Mouth Q6 As Needed Pain 10)  Flonase 50 Mcg/act Susp (Fluticasone Propionate) .... Two Sprays Each Nostril Daily 11)  Claritin 10 Mg Tabs (Loratadine) .... One Tab By Mouth Qday As Needed For Allergy Symptoms  Allergies: 1)  ! * Pulmicort 2)  ! * Nasacort 3)  Codeine 4)  Prednisone  Physical Exam  General:  Vitals reviewed. Obese female, NAD. Eyes:  No injection. Ears:  R ear normal and L ear normal.   Nose:  Nasal discharge, mucosal pallor, congestion  Mouth:  PND w/o erythema or exudate  Neck:  Supple and full ROM.  No lymphadenopathy.  Lungs:  CTAB w/o wheeze or crackles  Heart:  RRR, no murmurs or gallops, normal PMI. Abdomen:  obese, non tender, non distended, +BS    Impression & Recommendations:  Problem # 1:  ALLERGIC RHINITIS (ICD-477.9)  Will  add flonase for symptoms today. Appears to be related to change in weather. Symptoms improving with use of Claritin.   The following medications were removed from the medication list:    Zyrtec Allergy 10 Mg Tabs (Cetirizine hcl) .Marland Kitchen... 1 once daily prn Her updated medication list for this problem includes:    Flonase 50 Mcg/act Susp (Fluticasone propionate) .Marland Kitchen..Marland Kitchen Two sprays each nostril daily    Claritin 10 Mg Tabs (Loratadine) ..... One tab by mouth qday as needed for allergy symptoms  Discussed use of allergy medications and environmental measures.   Orders: [facility]- Est Level  3 (01027)  Problem # 2:  ASTHMA, INTERMITTENT (ICD-493.90)  Mild exacerbation, likely secondary to change in weather vs. viral URI. Advised to use inhaler as needed. No  need for adjunctive medications based on degree of symptomatology. No steroids given improving symptoms. Her updated medication list for this problem includes:    Proair Hfa 108 (90 Base) Mcg/act Aers (Albuterol sulfate) .Marland Kitchen... 2 puffs inhaled q 4 hrs as needed shortness of breath.  Orders: [facility]- Est Level  3 (99213)  Complete Medication List: 1)  Proair Hfa 108 (90 Base) Mcg/act Aers (Albuterol sulfate) .... 2 puffs inhaled q 4 hrs as needed shortness of breath. 2)  Hydrochlorothiazide 25 Mg Tabs (Hydrochlorothiazide) .... Take 1 tab by mouth every morning 3)  Nexium 40 Mg Cpdr (Esomeprazole magnesium) .... Take 1 capsule by mouth once a day 4)  Cyclobenzaprine Hcl 5 Mg Tabs (Cyclobenzaprine hcl) .Marland Kitchen.. 1 by mouth at bedtime 5)  Lisinopril 10 Mg Tabs (Lisinopril) .... Take 1 tab by mouth daily 6)  Valium 5 Mg Tabs (Diazepam) .Marland Kitchen.. 1 by mouth two times a day as needed 7)  Alphagan P 0.1 % Soln (Brimonidine tartrate) .Marland Kitchen.. 1 gtt each eye three times a day 8)  Xalatan 0.005 % Soln (Latanoprost) .Marland Kitchen.. 1 gtt each eye once a day 9)  Tramadol Hcl 50 Mg Tabs (Tramadol hcl) .... One tab by mouth q6 as needed pain 10)  Flonase 50 Mcg/act Susp (Fluticasone propionate) .... Two sprays each nostril daily 11)  Claritin 10 Mg Tabs (Loratadine) .... One tab by mouth qday as needed for allergy symptoms  Patient Instructions: 1)  Follow up in three months to check on blood pressure and weight loss efforts.  Prescriptions: NEXIUM 40 MG CPDR (ESOMEPRAZOLE MAGNESIUM) Take 1 capsule by mouth once a day  #30 Each x 2   Entered and Authorized by:   Bobby Rumpf  MD   Signed by:   Bobby Rumpf  MD on 10/20/2010   Method used:   Electronically to        [facility] [address] #[id number]* (retail)       [address] [place], [place]  [address]       Ph: [phone]       Fax: [phone]   RxID:   [id number] HYDROCHLOROTHIAZIDE 25 MG  TABS (HYDROCHLOROTHIAZIDE) Take 1 tab by mouth every morning  #31 x 6    Entered and Authorized by:   Bobby Rumpf  MD   Signed by:   Bobby Rumpf  MD on 10/20/2010   Method used:   Electronically to        [facility] [address] #[id number]* (retail)       [address] [place], [place]  [address]       Ph: [phone]       Fax:  [phone]   RxID:   [id number] PROAIR HFA 108 (90 BASE) MCG/ACT AERS (ALBUTEROL SULFATE) 2 puffs inhaled q 4 hrs as needed shortness of breath.  #8.5 Gram x 2   Entered and Authorized by:   Bobby Rumpf  MD   Signed by:   Bobby Rumpf  MD on 10/20/2010   Method used:   Electronically to        [facility] #[id number]* (retail)       [address] [place], [place]  [address]       Ph: [phone]       Fax: [phone]   RxID:   [id number] FLONASE 50 MCG/ACT SUSP (FLUTICASONE PROPIONATE) two sprays each nostril daily  #1 x 1   Entered and Authorized by:   Bobby Rumpf  MD   Signed by:   Bobby Rumpf  MD on 10/20/2010   Method used:   Electronically to        [facility] [address] #[id number]* (retail)       [address] [place], [place]  [address]       Ph: [phone]       Fax: [phone]   RxID:   [id number]    Orders Added: 1)  [facility]- Est Level  3 [76160]

## 2011-01-19 NOTE — Assessment & Plan Note (Signed)
Summary: flu shot,df  Nurse Visit Patient states that last year shortty after receiving flu vaccine she developed asthma flare up with some wheezing. Dr. Leveda Anna notified and he came in to speak with patient and it is decided to give vaccine now and have patient stay in office for 30 minutes to make sure she has no complications. patient waited 40 minutes without problem. Theresia Lo RN  October 06, 2010 8:54 AM   Vital Signs:  Patient profile:   55 year old female Temp:     98.3 degrees F  Vitals Entered By: Theresia Lo RN (October 06, 2010 8:48 AM)  Allergies: 1)  ! * Pulmicort 2)  ! * Nasacort 3)  Codeine 4)  Prednisone  Immunizations Administered:  Influenza Vaccine # 1:    Vaccine Type: Fluvax MCR    Site: right deltoid    Mfr: [employer]    Dose: 0.5 ml    Route: IM    Given by: Theresia Lo RN    Exp. Date: 06/16/2011    Lot #: [id number]    VIS given: 07/13/07 version given October 06, 2010.  Flu Vaccine Consent Questions:    Do you have a history of severe allergic reactions to this vaccine? no    Any prior history of allergic reactions to egg and/or gelatin? no    Do you have a sensitivity to the preservative Thimersol? no    Do you have a past history of Guillan-Barre Syndrome? no    Do you currently have an acute febrile illness? no    Have you ever had a severe reaction to latex? no    Vaccine information given and explained to patient? yes    Are you currently pregnant? no  Orders Added: 1)  Influenza Vaccine MCR [00025] 2)  Administration Flu vaccine - MCR [G0008]

## 2011-01-19 NOTE — Progress Notes (Signed)
Summary: Rx Req - Xenical   Phone Note Call from Patient Call back at Home Phone [phone]   Caller: Patient Summary of Call: Pt wants to get rx for Xenizal or Orislat found out ins will pay for these.   Initial call taken by: Clydell Hakim,  March 12, 2010 3:59 PM    New/Updated Medications: XENICAL 120 MG CAPS (ORLISTAT) 120 mg by mouth three times a day one hour prior to meals containing fat (if not fat containing can omit dose) Prescriptions: XENICAL 120 MG CAPS (ORLISTAT) 120 mg by mouth three times a day one hour prior to meals containing fat (if not fat containing can omit dose)  #90 x 3   Entered and Authorized by:   Bobby Rumpf  MD   Signed by:   Bobby Rumpf  MD on 03/16/2010   Method used:   Electronically to        [facility] #[id number]* (retail)       [address]       [place], [place]  [address]       Ph: [phone]       Fax: [phone]   RxID:   [id number]   Appended Document: Rx Req - Xenical  pt notified that rx sent in to pharmacy.

## 2011-01-19 NOTE — Consult Note (Signed)
Summary: Mickle Asper   Imported By: Clydell Hakim 05/18/2010 08:56:26  _____________________________________________________________________  External Attachment:    Type:   Image     Comment:   External Document  Appended Document: [facility] Reviewed. Will follow pathology from polyps removed from colon and sigmoid. Hold NSAIDs. High fiber diet

## 2011-01-19 NOTE — Progress Notes (Signed)
Summary: xray  Phone Note Call from Patient Call back at [phone]   Caller: Patient Call For: Roberta Rumpf  MD Summary of Call: pt is having some breathing issues an would like chest xray. thinks that she has pnuemonia Initial call taken by: Loralee Pacas CMA,  November 02, 2010 9:03 AM  Follow-up for Phone Call        Called patient, reports that may be having asthma exacebatrion w/ short of breath - she is on her way to Urgent Care. Will follow. Follow-up by: Roberta Rumpf  MD,  November 02, 2010 1:14 PM

## 2011-01-19 NOTE — Assessment & Plan Note (Signed)
Summary: SORE THROAT, HOARSENESS AND NAUSEA SINCE FLU VACCINE/LS   Vital Signs:  Patient profile:   55 year old female Temp:     98.7 degrees F BP sitting:   138 / 90  (left arm)  Vitals Entered By: Starleen Blue RN (October 14, 2010 3:03 PM) CC: sore throat Is Patient Diabetic? No Pain Assessment Patient in pain? no        Primary Care Provider:  Bobby Rumpf  MD  CC:  sore throat.  History of Present Illness: 55 yo F:  2. Sore Throat: since fluvax 10/17, associated nausea, runny nose, cough. Denies fever/chills, HA, dizziness, ear pain, CP, SOB, abdominal pain, D/C, LE edema, rash. Hx allergies, not taking Zyrtec.  Habits & Providers  Alcohol-Tobacco-Diet     Tobacco Status: never  Current Medications (verified): 1)  Proair Hfa 108 (90 Base) Mcg/act Aers (Albuterol Sulfate) .... 2 Puffs Inhaled Q 4 Hrs As Needed Shortness of Breath. 2)  Hydrochlorothiazide 25 Mg  Tabs (Hydrochlorothiazide) .... Take 1 Tab By Mouth Every Morning 3)  Nexium 40 Mg Cpdr (Esomeprazole Magnesium) .... Take 1 Capsule By Mouth Once A Day 4)  Darvocet-N 100 100-650 Mg Tabs (Propoxyphene N-Apap) .Marland Kitchen.. 1 By Mouth Two Times A Day 5)  Cyclobenzaprine Hcl 5 Mg  Tabs (Cyclobenzaprine Hcl) .Marland Kitchen.. 1 By Mouth At Bedtime 6)  Lisinopril 10 Mg  Tabs (Lisinopril) .... Take 1 Tab By Mouth Daily 7)  Valium 5 Mg  Tabs (Diazepam) .Marland Kitchen.. 1 By Mouth Two Times A Day As Needed 8)  Zyrtec Allergy 10 Mg  Tabs (Cetirizine Hcl) .Marland Kitchen.. 1 Once Daily Prn 9)  Alphagan P 0.1 % Soln (Brimonidine Tartrate) .Marland Kitchen.. 1 Gtt Each Eye Two Times A Day 10)  Xalatan 0.005 % Soln (Latanoprost) .Marland Kitchen.. 1 Gtt Each Eye Two Times A Day 11)  Xenical 120 Mg Caps (Orlistat) .Marland Kitchen.. 120 Mg By Mouth Three Times A Day One Hour Prior To Meals Containing Fat (If Not Fat Containing Can Omit Dose)  Allergies (verified): 1)  ! * Pulmicort 2)  ! * Nasacort 3)  Codeine 4)  Prednisone PMH-FH-SH reviewed for relevance  Review of Systems      See HPI  Physical  Exam  General:  Vitals reviewed. Obese female, NAD. Eyes:  No injection. Ears:  R ear normal and L ear normal.   Nose:  Nasal discharge, mucosal pallor.   Mouth:  PND. Neck:  Supple and full ROM.  No lymphadenopathy.  Lungs:  CTAB. Heart:  RRR, no murmurs or gallops, normal PMI.   Impression & Recommendations:  Problem # 1:  ALLERGIC RHINITIS (ICD-477.9) Assessment New  Restart Zyrtec. Warm tea with honey prn sore throat. Follow up in 1 week if not improving. Her updated medication list for this problem includes:    Zyrtec Allergy 10 Mg Tabs (Cetirizine hcl) .Marland Kitchen... 1 once daily prn  Orders: [facility]- Est Level  3 (99213)  Complete Medication List: 1)  Proair Hfa 108 (90 Base) Mcg/act Aers (Albuterol sulfate) .... 2 puffs inhaled q 4 hrs as needed shortness of breath. 2)  Hydrochlorothiazide 25 Mg Tabs (Hydrochlorothiazide) .... Take 1 tab by mouth every morning 3)  Nexium 40 Mg Cpdr (Esomeprazole magnesium) .... Take 1 capsule by mouth once a day 4)  Darvocet-n 100 100-650 Mg Tabs (Propoxyphene n-apap) .Marland Kitchen.. 1 by mouth two times a day 5)  Cyclobenzaprine Hcl 5 Mg Tabs (Cyclobenzaprine hcl) .Marland Kitchen.. 1 by mouth at bedtime 6)  Lisinopril 10 Mg Tabs (Lisinopril) .... Take  1 tab by mouth daily 7)  Valium 5 Mg Tabs (Diazepam) .Marland Kitchen.. 1 by mouth two times a day as needed 8)  Zyrtec Allergy 10 Mg Tabs (Cetirizine hcl) .Marland Kitchen.. 1 once daily prn 9)  Alphagan P 0.1 % Soln (Brimonidine tartrate) .Marland Kitchen.. 1 gtt each eye two times a day 10)  Xalatan 0.005 % Soln (Latanoprost) .Marland Kitchen.. 1 gtt each eye two times a day 11)  Xenical 120 Mg Caps (Orlistat) .Marland Kitchen.. 120 mg by mouth three times a day one hour prior to meals containing fat (if not fat containing can omit dose)  Other Orders: Rapid Strep-[facility] (16109)  Patient Instructions: 1)  Follow up in week with Dr. Wallene Huh if you are not feeling better. Prescriptions: ZYRTEC ALLERGY 10 MG  TABS (CETIRIZINE HCL) 1 once daily prn  #30 x 0   Entered and Authorized by:   Helane Rima DO   Signed by:   Helane Rima DO on 10/14/2010   Method used:   Electronically to        [facility] # [phone]* (retail)       [address]       [place], [place]  [address]       Ph: [phone]       Fax: [phone]   RxID:   [id number]    Orders Added: 1)  Rapid Strep[facility] [87430] 2)  [facility]- Est Level  3 [99213]    Laboratory Results  Date/Time Received: October 14, 2010 3:21 PM  Date/Time Reported: October 14, 2010 3:33 PM   Other Tests  Rapid Strep: negative Comments: ...........test performed by............Marland KitchenDewitt Hoes, [place](ASCP)3:34 PM entered by Terese Door, CMA

## 2011-01-19 NOTE — Assessment & Plan Note (Signed)
Summary: f/up,tcb   Vital Signs:  Patient profile:   55 year old female Height:      66 inches Weight:      262 pounds BMI:     42.44 BSA:     2.25 Temp:     98.2 degrees F Pulse rate:   109 / minute BP sitting:   141 / 85  Vitals Entered By: Jone Baseman CMA (January 07, 2010 3:25 PM) CC: routine visit Is Patient Diabetic? No Pain Assessment Patient in pain? yes     Location: back and left leg Intensity: 5   Primary Care Provider:  Bobby Rumpf  MD  CC:  routine visit.  History of Present Illness: 1) Cervical spondylosis s/p surgical correction: Patient underwent  1. Anterior diskectomy with decompression C4-5 and C6-7.  2. Anterior arthrodesis, C4-5 and C6-7. 3. Anterior instrumentation, C4, C5, C6, and C7. 4. Allograft placement x2. for cervical myeloradiculopathy. Surgery performed by Dr. Yevette Edwards. Patient was at [facility] for one week for physical therapy s/p above procedures. She is doing well at this time and her pain is well controlled with Darvocet and Flexeril. Continues to improve. Released from Personal Care services as she is doing well. Out of [employer], out of soft collar. Cadaver bone graft w/o rejection. Continues to use bone growth stimulator. Follow up with Dr. Yevette Edwards on 01/13/10. No cervical symptoms.   2) HTN: BP 141/85. Has been taking all medications as prescribed w/o side effects. Denies chest pain, dyspnea, LE edema. Has been trying to monitor salt intake.   3) Obesity: Weight 262 today, wants clearance for exercise participation with regards to her recent surgery.     Habits & Providers  Alcohol-Tobacco-Diet     Tobacco Status: never  Allergies: 1)  ! * Pulmicort 2)  ! * Nasacort 3)  Codeine 4)  Prednisone  Past History:  Past Surgical History: - BTL (1992) - Cholecystectomy (1980) - Excision of cyst from braline (2005) -  Left hallux surgery (Dr. Renae Fickle) - 08/20/2004,  - Pelvic U/S: enlarged uterus, 2 small  fibroids; thick endometrium; nl ovaries - 03/20/2002- - Removal of cervical polyp ([place]) - 03/20/2005,  - Right hallux surgery (Dr. Renae Fickle) - 12/11/2004 - Anterior diskectomy with decompression C4-5 and C6-7.  2. Anterior arthrodesis, C4-5 and C6-7. 3. Anterior instrumentation, C4, C5, C6, and C7. 4. Allograft placement x2. for cervical myeloradiculopathy. Surgery performed by Dr. Yevette Edwards. 2009  Physical Exam  General:  vitals reviewed  obese female, NAD, wearing bone stimulator  Neck:  improved ROM with rotation bilaterally, still not full ROM. poor ROM with extension > flexion unchanged from prior exam. no radicular symptoms  Lungs:  CTAB  Heart:  RRR, no murmurs or gallops, normal PMI  Msk:  5/5 strength bilateral upper and lower extremities. sensation intact bilateral upper extremities  Pulses:  2+ radials  Extremities:  trace pedal edema  Neurologic:  alert & oriented X3, cranial nerves II-XII intact, strength normal in all extremities, and sensation intact to light touch.     Impression & Recommendations:  Problem # 1:  CERVICAL RADICULOPATHY (ICD-723.4) Assessment Improved  s/p surgical correction as above. Plan per Dr. Yevette Edwards. . Will follow in three months. Appears improved by history and exam. Pain control per Dr. Yevette Edwards w/ Darvocet and Flexeril.   Orders: [facility]- Est  Level 4 ([address])  Orders: [facility]- Est  Level 4 ([address])  Problem # 2:  OBESITY, NOS (ICD-278.00)  Dietary and exercise counseling x  25 minutes. Patient to re-start exercise routine w/ stair climber, stationary bike, air-walker, arm and leg weights. WIll increase fruits and vegtable intake, monitor salt, eliminate fried foods. Patient highly motivated.  Encouraged to start  thinking about changes that she can make now.  Ht: 66 (10/21/2009)   Wt: 257 (10/21/2009)   BMI: 42.28 (09/01/2009)  Orders: [facility]- Est  Level 4 (13086)  Orders: [facility]- Est  Level 4 (57846)  Problem # 3:  HYPERTENSION, BENIGN SYSTEMIC  (ICD-401.1) Assessment: Unchanged  Improved since last visit but not at goal. No changes at this time. Will restart exercise when given clearance by neurosurgery. DASH diet.  Her updated medication list for this problem includes:    Hydrochlorothiazide 25 Mg Tabs (Hydrochlorothiazide) .Marland Kitchen... Take 1 tab by mouth every morning    Lisinopril 10 Mg Tabs (Lisinopril) .Marland Kitchen... Take 1 tab by mouth daily  BP today: 141/85 Prior BP: 145/91 (10/21/2009)  Prior 10 Yr Risk Heart Disease: 9 % (07/01/2008)  Labs Reviewed: K+: 4.5 (09/01/2009) Creat: : 1.11 (09/01/2009)   Chol: 209 (05/28/2008)   HDL: 39 (05/28/2008)   LDL: 141 (05/28/2008)   TG: 145 (05/28/2008)  Problem # 4:  HYPERLIPIDEMIA (ICD-272.4) Assessment: Unchanged  Will check CMET, lipid panel. Consider start statin based on results (was not at goal at last check). Dietary recs reviewed.   Labs Reviewed: SGOT: 14 (02/26/2009)   SGPT: 14 (02/26/2009)  Lipid Goals: Chol Goal: 200 (07/01/2008)   HDL Goal: 40 (07/01/2008)   LDL Goal: 130 (07/01/2008)   TG Goal: 150 (07/01/2008)  Prior 10 Yr Risk Heart Disease: 9 % (07/01/2008)   HDL:39 (05/28/2008)  LDL:141 (05/28/2008)  Chol:209 (05/28/2008)  Trig:145 (05/28/2008)  Orders: [facility]- Est  Level 4 (96295)  Complete Medication List: 1)  Proair Hfa 108 (90 Base) Mcg/act Aers (Albuterol sulfate) .... 2 puffs inhaled q 4 hrs as needed shortness of breath. 2)  Hydrochlorothiazide 25 Mg Tabs (Hydrochlorothiazide) .... Take 1 tab by mouth every morning 3)  Nexium 40 Mg Cpdr (Esomeprazole magnesium) .... Take 1 capsule by mouth once a day 4)  Darvocet-n 100 100-650 Mg Tabs (Propoxyphene n-apap) .Marland Kitchen.. 1 by mouth two times a day 5)  Cyclobenzaprine Hcl 5 Mg Tabs (Cyclobenzaprine hcl) .Marland Kitchen.. 1 by mouth at bedtime 6)  Lisinopril 10 Mg Tabs (Lisinopril) .... Take 1 tab by mouth daily 7)  Valium 5 Mg Tabs (Diazepam) .Marland Kitchen.. 1 by mouth two times a day as needed 8)  Zyrtec Allergy 10 Mg Tabs (Cetirizine hcl) .Marland Kitchen..  1 once daily prn 9)  Alphagan P 0.1 % Soln (Brimonidine tartrate) .Marland Kitchen.. 1 gtt each eye two times a day 10)  Xalatan 0.005 % Soln (Latanoprost) .Marland Kitchen.. 1 gtt each eye two times a day 11)  Tessalon Perles 100 Mg Caps (Benzonatate) .Marland Kitchen.. 1 cap by mouth three times a day as needed cough  Other Orders: Comp Met-[facility] [phone]) Lipid-[facility] ([id number]) CBC-[facility] (03474)  Patient Instructions: 1)  It was great to see you today! 2)  Try to get back to exercising 3-4 days a week. 3)  Try to eat lots of fresh and frozen fruits and vegetables. Avoid adding salt to foods you cook, and try to keep your sodium below 2000 mg.  4)  Follow up in 4 months. We check your progress toward your weight loss goal and check your blood pressure 5)  I will call you about your labs next week Prescriptions: HYDROCHLOROTHIAZIDE 25 MG  TABS (HYDROCHLOROTHIAZIDE) Take 1 tab by mouth every morning  #31  x 6   Entered and Authorized by:   Bobby Rumpf  MD   Signed by:   Bobby Rumpf  MD on 01/07/2010   Method used:   Electronically to        [facility] [id number]* (retail)       [address] [place], [place]  [address]       Ph: [phone]       Fax: [phone]   RxID:   [id number]    Prevention & Chronic Care Immunizations   Influenza vaccine: Fluvax MCR  (10/02/2008)   Influenza vaccine due: Not Indicated    Tetanus booster: 12/20/2000: Done.   Tetanus booster due: 12/20/2010    Pneumococcal vaccine: Not documented  Colorectal Screening   Hemoccult: Not documented   Hemoccult due: Not Indicated    Colonoscopy: Done.  (04/19/2005)   Colonoscopy due: 04/2010  Other Screening   Pap smear: normal  (08/21/2007)   Pap smear due: 08/20/2010    Mammogram: normal  (06/24/2008)   Mammogram due: 06/24/2009   Smoking status: never  (01/07/2010)  Lipids   Total Cholesterol: 209  (05/28/2008)   Lipid panel action/deferral: Lipid Panel ordered   LDL: 141  (05/28/2008)   LDL Direct: 117   (11/02/2007)   HDL: 39  (05/28/2008)   Triglycerides: 145  (05/28/2008)   Lipid panel due: 04/07/2010    SGOT (AST): 14  (02/26/2009)   SGPT (ALT): 14  (02/26/2009) CMP ordered    Alkaline phosphatase: 87  (02/26/2009)   Total bilirubin: 0.3  (02/26/2009)   Liver panel due: 04/07/2010    Lipid flowsheet reviewed?: Yes   Progress toward LDL goal: Unchanged  Hypertension   Last Blood Pressure: 141 / 85  (01/07/2010)   Serum creatinine: 1.11  (09/01/2009)   BMP action: Ordered   Serum potassium 4.5  (09/01/2009) CMP ordered     Hypertension flowsheet reviewed?: Yes   Progress toward BP goal: At goal  Self-Management Support :   Personal Goals (by the next clinic visit) :      Personal blood pressure goal: 140/90  (01/07/2010)     Personal LDL goal: 130  (01/07/2010)    Patient will work on the following items until the next clinic visit to reach self-care goals:     Medications and monitoring: take my medicines every day, check my blood pressure, weigh myself weekly  (01/07/2010)     Eating: drink diet soda or water instead of juice or soda, eat more vegetables, use fresh or frozen vegetables, eat foods that are low in salt, eat baked foods instead of fried foods, limit or avoid alcohol  (01/07/2010)    Hypertension self-management support: BP self-monitoring log, Written self-care plan, Education handout  (01/07/2010)   Hypertension self-care plan printed.   Hypertension education handout printed    Hypertension self-management support not done because: Good outcomes  (01/07/2010)    Lipid self-management support: Written self-care plan, Education handout  (01/07/2010)   Lipid self-care plan printed.   Lipid education handout printed  Appended Document: Orders Update Order for 6 month Follow up suspiscious mass left breast - found to be benign adipose tissue w/ local necrosis on biopsy.    Clinical Lists Changes  Problems: Added new problem of BREAST MASS, BENIGN  (ICD-611.72) Orders: Added new Test order of Mammogram (Mammogram) - Signed

## 2011-01-19 NOTE — Progress Notes (Signed)
Summary: triage  Phone Note Call from Patient Call back at [phone]   Caller: Patient Summary of Call: sore throat/nausea/hoarsness  got flu shot last week Initial call taken by: De Nurse,  October 14, 2010 11:14 AM  Follow-up for Phone Call         patient states the day after she received flu vaccine she started feeling bad.  she has since developed sore throat, hoarsness  and nausea. feels like she just wants to go to bed. received flu vaccine on 10/05/2010 and has felt bad since them.  last year after receiving flu vaccine she had asthma flare up shorlky after receiving vaccine. appointment scheduled for today . Follow-up by: Theresia Lo RN,  October 14, 2010 12:25 PM

## 2011-01-19 NOTE — Progress Notes (Signed)
Summary: triage  Phone Note Call from Patient Call back at Home Phone [phone]   Caller: Patient Summary of Call: rec'f flu shot yesterday and today is dizzy/nausea/back pain Initial call taken by: De Nurse,  October 07, 2010 11:21 AM  Follow-up for Phone Call        uses tramadol for pain. states she does not use valium, flexerill anymore. says sleep has gotten better. hx of back pain. told her not likely associated with the flu shot. offered appt. she decided to wait on that. told her the flu shot may make you feel bad for 2 days, but then she will be back to normal. to call back if she feels she needs to be seen Follow-up by: Golden Circle RN,  October 07, 2010 11:36 AM

## 2011-01-19 NOTE — Assessment & Plan Note (Signed)
Summary: f/u,df   Vital Signs:  Patient profile:   55 year old female Weight:      247.5 pounds BMI:     40.09 Temp:     97.7 degrees F Pulse rate:   90 / minute BP sitting:   111 / 80  (right arm)  Vitals Entered By: Starleen Blue RN (June 02, 2010 2:31 PM) CC: f/u Is Patient Diabetic? No Pain Assessment Patient in pain? no        Primary Care Provider:  Bobby Rumpf  MD  CC:  f/u.  History of Present Illness: 1) Obesity: BMI 40 today (down from 42 in Janurary 2011) Weight 247 lbs today (down from 262 on 12/28/09; max of 273 in 2008). Has continued with exercise at gym with trainer MWF 2 hours a day with elliptical machine, treadmill and light arm weights (max 5 lbs). Also started belly dancing. Continues to have 4 small meals a day. Has eliminated fried foods, fast foods. Has increased fruits and vegetables and whole grains and decreased salt. Has stopped taking diet pills at my recommendation as per last visit. Was unable to get Alli paid for through insurance.   2) HTN: BP 111/80 today; at last visit was 108/79. Vastly improved without additional medications since starting exercise and changing diet. Taking all medication, dietary and exercise change as above. Denies LE edema, dyspnea, exertional chest pain, neurological symptoms.   Habits & Providers  Alcohol-Tobacco-Diet     Tobacco Status: never  Current Medications (verified): 1)  Proair Hfa 108 (90 Base) Mcg/act Aers (Albuterol Sulfate) .... 2 Puffs Inhaled Q 4 Hrs As Needed Shortness of Breath. 2)  Hydrochlorothiazide 25 Mg  Tabs (Hydrochlorothiazide) .... Take 1 Tab By Mouth Every Morning 3)  Nexium 40 Mg Cpdr (Esomeprazole Magnesium) .... Take 1 Capsule By Mouth Once A Day 4)  Darvocet-N 100 100-650 Mg Tabs (Propoxyphene N-Apap) .Marland Kitchen.. 1 By Mouth Two Times A Day 5)  Cyclobenzaprine Hcl 5 Mg  Tabs (Cyclobenzaprine Hcl) .Marland Kitchen.. 1 By Mouth At Bedtime 6)  Lisinopril 10 Mg  Tabs (Lisinopril) .... Take 1 Tab By Mouth Daily 7)   Valium 5 Mg  Tabs (Diazepam) .Marland Kitchen.. 1 By Mouth Two Times A Day As Needed 8)  Zyrtec Allergy 10 Mg  Tabs (Cetirizine Hcl) .Marland Kitchen.. 1 Once Daily Prn 9)  Alphagan P 0.1 % Soln (Brimonidine Tartrate) .Marland Kitchen.. 1 Gtt Each Eye Two Times A Day 10)  Xalatan 0.005 % Soln (Latanoprost) .Marland Kitchen.. 1 Gtt Each Eye Two Times A Day 11)  Tessalon Perles 100 Mg Caps (Benzonatate) .Marland Kitchen.. 1 Cap By Mouth Three Times A Day As Needed Cough 12)  Xenical 120 Mg Caps (Orlistat) .Marland Kitchen.. 120 Mg By Mouth Three Times A Day One Hour Prior To Meals Containing Fat (If Not Fat Containing Can Omit Dose)  Allergies (verified): 1)  ! * Pulmicort 2)  ! * Nasacort 3)  Codeine 4)  Prednisone  Review of Systems       as per HPI o/w negative   Physical Exam  General:  vitals reviewed  obese female, NAD Lungs:  CTAB  Heart:  RRR, no murmurs or gallops, normal PMI  Abdomen:  obese, non tender, non distended  Pulses:  2+ radials  Extremities:  no pedal edema    Impression & Recommendations:  Problem # 1:  OBESITY, NOS (ICD-278.00) Assessment Unchanged  Improving. BMI 42 -> 40 Weight 262 -> 247 Patient motivated. Counselled regarding diet and exercise for 25 minutes.  Encouraged patient  to continue activity. Will follow in 6 months.   Orders: [facility]- Est  Level 4 ([address])  Problem # 2:  HYPERTENSION, BENIGN SYSTEMIC (ICD-401.1)  Improved pressures today; likely due to diet and exercise. Will follow in 6 months. Would titrate back on antihypertensives if remains well controlled.  Her updated medication list for this problem includes:    Hydrochlorothiazide 25 Mg Tabs (Hydrochlorothiazide) .Marland Kitchen... Take 1 tab by mouth every morning    Lisinopril 10 Mg Tabs (Lisinopril) .Marland Kitchen... Take 1 tab by mouth daily  BP today: 111/80 Prior BP: 108/79 (03/05/2010)  Prior 10 Yr Risk Heart Disease: 9 % (07/01/2008)  Labs Reviewed: K+: 3.6 (01/07/2010) Creat: : 0.96 (01/07/2010)   Chol: 176 (01/07/2010)   HDL: 44 (01/07/2010)   LDL: 106 (01/07/2010)   TG:  128 (01/07/2010)  Orders: [facility]- Est  Level 4 (99214)  Complete Medication List: 1)  Proair Hfa 108 (90 Base) Mcg/act Aers (Albuterol sulfate) .... 2 puffs inhaled q 4 hrs as needed shortness of breath. 2)  Hydrochlorothiazide 25 Mg Tabs (Hydrochlorothiazide) .... Take 1 tab by mouth every morning 3)  Nexium 40 Mg Cpdr (Esomeprazole magnesium) .... Take 1 capsule by mouth once a day 4)  Darvocet-n 100 100-650 Mg Tabs (Propoxyphene n-apap) .Marland Kitchen.. 1 by mouth two times a day 5)  Cyclobenzaprine Hcl 5 Mg Tabs (Cyclobenzaprine hcl) .Marland Kitchen.. 1 by mouth at bedtime 6)  Lisinopril 10 Mg Tabs (Lisinopril) .... Take 1 tab by mouth daily 7)  Valium 5 Mg Tabs (Diazepam) .Marland Kitchen.. 1 by mouth two times a day as needed 8)  Zyrtec Allergy 10 Mg Tabs (Cetirizine hcl) .Marland Kitchen.. 1 once daily prn 9)  Alphagan P 0.1 % Soln (Brimonidine tartrate) .Marland Kitchen.. 1 gtt each eye two times a day 10)  Xalatan 0.005 % Soln (Latanoprost) .Marland Kitchen.. 1 gtt each eye two times a day 11)  Tessalon Perles 100 Mg Caps (Benzonatate) .Marland Kitchen.. 1 cap by mouth three times a day as needed cough 12)  Xenical 120 Mg Caps (Orlistat) .Marland Kitchen.. 120 mg by mouth three times a day one hour prior to meals containing fat (if not fat containing can omit dose)   Prevention & Chronic Care Immunizations   Influenza vaccine: Fluvax MCR  (10/02/2008)   Influenza vaccine due: Not Indicated    Tetanus booster: 12/20/2000: Done.   Tetanus booster due: 12/20/2010    Pneumococcal vaccine: Not documented  Colorectal Screening   Hemoccult: Not documented   Hemoccult due: Not Indicated    Colonoscopy: Done.  (04/19/2005)   Colonoscopy due: 04/2010  Other Screening   Pap smear: normal  (08/21/2007)   Pap smear due: 08/20/2010    Mammogram: Benign findings   (03/04/2010)   Mammogram due: 09/04/2010   Smoking status: never  (06/02/2010)  Lipids   Total Cholesterol: 176  (01/07/2010)   Lipid panel action/deferral: Lipid Panel ordered   LDL: 106  (01/07/2010)   LDL Direct: 117   (11/02/2007)   HDL: 44  (01/07/2010)   Triglycerides: 128  (01/07/2010)   Lipid panel due: 04/07/2010    SGOT (AST): 14  (01/07/2010)   SGPT (ALT): 12  (01/07/2010)   Alkaline phosphatase: 76  (01/07/2010)   Total bilirubin: 0.2  (01/07/2010)   Liver panel due: 04/07/2010    Lipid flowsheet reviewed?: Yes   Progress toward LDL goal: Improved  Hypertension   Last Blood Pressure: 111 / 80  (06/02/2010)   Serum creatinine: 0.96  (01/07/2010)   BMP action: Ordered   Serum potassium 3.6  (  01/07/2010)   Basic metabolic panel due: 09/05/2010    Hypertension flowsheet reviewed?: Yes   Progress toward BP goal: At goal  Self-Management Support :   Personal Goals (by the next clinic visit) :      Personal blood pressure goal: 140/90  (01/07/2010)     Personal LDL goal: 100  (03/05/2010)    Patient will work on the following items until the next clinic visit to reach self-care goals:     Medications and monitoring: take my medicines every day, check my blood pressure, bring all of my medications to every visit, weigh myself weekly  (06/02/2010)     Eating: drink diet soda or water instead of juice or soda, eat more vegetables, use fresh or frozen vegetables, eat foods that are low in salt, eat baked foods instead of fried foods, eat fruit for snacks and desserts, limit or avoid alcohol  (06/02/2010)     Activity: take a 30 minute walk every day  (06/02/2010)    Hypertension self-management support: BP self-monitoring log, Written self-care plan, Education handout  (01/07/2010)    Hypertension self-management support not done because: Good outcomes  (06/02/2010)    Lipid self-management support: Written self-care plan, Education handout  (03/05/2010)     Lipid self-management support not done because: Good outcomes  (06/02/2010)

## 2011-01-19 NOTE — Assessment & Plan Note (Signed)
Summary: f/up diet pill,tcb   Vital Signs:  Patient profile:   55 year old female Height:      66 inches Weight:      248 pounds BMI:     40.17 BSA:     2.19 Temp:     97.7 degrees F Pulse rate:   68 / minute BP sitting:   108 / 79  Vitals Entered By: Jone Baseman CMA (March 05, 2010 11:23 AM) CC: F/U Diet pills Is Patient Diabetic? No Pain Assessment Patient in pain? no        Primary Care Provider:  Bobby Rumpf  MD  CC:  F/U Diet pills.  History of Present Illness: 1) Obesity: BMI 40 today (down from 42 in Janurary 2011) Weight 248 lbs today (down from 262 on 12/28/09; max of 273 in 2008). Has started exercise at gym with trainer MWF 2 hours a day with elliptical machine, treadmill and light arm weights (max 5 lbs).  Eating three meals a day. Has eliminated fried foods, fast foods. Has increased fruits and vegetables and whole grains and decreased salt. Also started taking two diet pills:  Metabo[employer]! - B6, B12, green tea, guarana, oolong tea, kola nut, cayenne,  platycodon - she does not like how this makes her feel ("too revved up") Lipozene - amorphophallus - wants to know if she should continue  Also interested in Alli - her son takes this.  2) HTN: BP 108/79 today. Taking all medication, dietary and exercise change as above. Denies LE edema, dyspnea, exertional chest pain, neurological symptoms.   Habits & Providers  Alcohol-Tobacco-Diet     Tobacco Status: never  Current Medications (verified): 1)  Proair Hfa 108 (90 Base) Mcg/act Aers (Albuterol Sulfate) .... 2 Puffs Inhaled Q 4 Hrs As Needed Shortness of Breath. 2)  Hydrochlorothiazide 25 Mg  Tabs (Hydrochlorothiazide) .... Take 1 Tab By Mouth Every Morning 3)  Nexium 40 Mg Cpdr (Esomeprazole Magnesium) .... Take 1 Capsule By Mouth Once A Day 4)  Darvocet-N 100 100-650 Mg Tabs (Propoxyphene N-Apap) .Marland Kitchen.. 1 By Mouth Two Times A Day 5)  Cyclobenzaprine Hcl 5 Mg  Tabs (Cyclobenzaprine Hcl) .Marland Kitchen.. 1 By Mouth At  Bedtime 6)  Lisinopril 10 Mg  Tabs (Lisinopril) .... Take 1 Tab By Mouth Daily 7)  Valium 5 Mg  Tabs (Diazepam) .Marland Kitchen.. 1 By Mouth Two Times A Day As Needed 8)  Zyrtec Allergy 10 Mg  Tabs (Cetirizine Hcl) .Marland Kitchen.. 1 Once Daily Prn 9)  Alphagan P 0.1 % Soln (Brimonidine Tartrate) .Marland Kitchen.. 1 Gtt Each Eye Two Times A Day 10)  Xalatan 0.005 % Soln (Latanoprost) .Marland Kitchen.. 1 Gtt Each Eye Two Times A Day 11)  Tessalon Perles 100 Mg Caps (Benzonatate) .Marland Kitchen.. 1 Cap By Mouth Three Times A Day As Needed Cough  Allergies (verified): 1)  ! * Pulmicort 2)  ! * Nasacort 3)  Codeine 4)  Prednisone  Physical Exam  General:  vitals reviewed  obese female, NAD Lungs:  CTAB  Heart:  RRR, no murmurs or gallops, normal PMI  Abdomen:  obese, non tender, non distended  Extremities:  trace pedal edema    Impression & Recommendations:  Problem # 1:  OBESITY, NOS (ICD-278.00) Assessment Unchanged  BMI 42 -> 40 Weight 262 -> 248 Patient motivated. Counselled regarding diet and exercise for 25 minutes. Advised against diet pills - patient agreeable to stopping. Printed patient information from [email or web address] regarding Alli. Discussed potential risks and benefits. Patient to decide on this medication and  call if she wants to start taking it. Encouraged patient.   Orders: [facility]- Est  Level 4 ([address])  Problem # 2:  HYPERTENSION, BENIGN SYSTEMIC (ICD-401.1) Assessment: Unchanged  Improved pressures today; likely due to diet and exercise. Will follow in three months. Consider reduced doses of antihypertensives if remains well controlled.  Her updated medication list for this problem includes:    Hydrochlorothiazide 25 Mg Tabs (Hydrochlorothiazide) .Marland Kitchen... Take 1 tab by mouth every morning    Lisinopril 10 Mg Tabs (Lisinopril) .Marland Kitchen... Take 1 tab by mouth daily  Orders: [facility]- Est  Level 4 ([address])  Problem # 3:  HYPERLIPIDEMIA (ICD-272.4) Assessment: Unchanged  Labs as below. Will not start medications at this time. Diet and  exercise as above.  Labs Reviewed: SGOT: 14 (01/07/2010)   SGPT: 12 (01/07/2010)  Lipid Goals: Chol Goal: 200 (07/01/2008)   HDL Goal: 40 (07/01/2008)   LDL Goal: 130 (07/01/2008)   TG Goal: 150 (07/01/2008)  Prior 10 Yr Risk Heart Disease: 9 % (07/01/2008)   HDL:44 (01/07/2010), 39 (05/28/2008)  LDL:106 (01/07/2010), 141 (05/28/2008)  Chol:176 (01/07/2010), 209 (05/28/2008)  Trig:128 (01/07/2010), 145 (05/28/2008)  Orders: [facility]- Est  Level 4 ([address])  Complete Medication List: 1)  Proair Hfa 108 (90 Base) Mcg/act Aers (Albuterol sulfate) .... 2 puffs inhaled q 4 hrs as needed shortness of breath. 2)  Hydrochlorothiazide 25 Mg Tabs (Hydrochlorothiazide) .... Take 1 tab by mouth every morning 3)  Nexium 40 Mg Cpdr (Esomeprazole magnesium) .... Take 1 capsule by mouth once a day 4)  Darvocet-n 100 100-650 Mg Tabs (Propoxyphene n-apap) .Marland Kitchen.. 1 by mouth two times a day 5)  Cyclobenzaprine Hcl 5 Mg Tabs (Cyclobenzaprine hcl) .Marland Kitchen.. 1 by mouth at bedtime 6)  Lisinopril 10 Mg Tabs (Lisinopril) .... Take 1 tab by mouth daily 7)  Valium 5 Mg Tabs (Diazepam) .Marland Kitchen.. 1 by mouth two times a day as needed 8)  Zyrtec Allergy 10 Mg Tabs (Cetirizine hcl) .Marland Kitchen.. 1 once daily prn 9)  Alphagan P 0.1 % Soln (Brimonidine tartrate) .Marland Kitchen.. 1 gtt each eye two times a day 10)  Xalatan 0.005 % Soln (Latanoprost) .Marland Kitchen.. 1 gtt each eye two times a day 11)  Tessalon Perles 100 Mg Caps (Benzonatate) .Marland Kitchen.. 1 cap by mouth three times a day as needed cough  Flex Sig Next Due:  Not Indicated Hemoccult Next Due:  Not Indicated Last Mammogram:  normal (06/24/2008 4:51:57 PM) Mammogram Result Date:  03/04/2010 Mammogram Result:  Benign findings  Mammogram Next Due:  6 mo    Prevention & Chronic Care Immunizations   Influenza vaccine: Fluvax MCR  (10/02/2008)   Influenza vaccine due: Not Indicated    Tetanus booster: 12/20/2000: Done.   Tetanus booster due: 12/20/2010    Pneumococcal vaccine: Not documented  Colorectal  Screening   Hemoccult: Not documented   Hemoccult due: Not Indicated    Colonoscopy: Done.  (04/19/2005)   Colonoscopy due: 04/2010  Other Screening   Pap smear: normal  (08/21/2007)   Pap smear due: 08/20/2010    Mammogram: Benign findings   (03/04/2010)   Mammogram due: 09/04/2010   Smoking status: never  (03/05/2010)  Lipids   Total Cholesterol: 176  (01/07/2010)   Lipid panel action/deferral: Lipid Panel ordered   LDL: 106  (01/07/2010)   LDL Direct: 117  (11/02/2007)   HDL: 44  (01/07/2010)   Triglycerides: 128  (01/07/2010)   Lipid panel due: 04/07/2010    SGOT (AST): 14  (01/07/2010)   SGPT (ALT): 12  (  01/07/2010)   Alkaline phosphatase: 76  (01/07/2010)   Total bilirubin: 0.2  (01/07/2010)   Liver panel due: 04/07/2010    Lipid flowsheet reviewed?: Yes   Progress toward LDL goal: Unchanged  Hypertension   Last Blood Pressure: 108 / 79  (03/05/2010)   Serum creatinine: 0.96  (01/07/2010)   BMP action: Ordered   Serum potassium 3.6  (01/07/2010)   Basic metabolic panel due: 09/05/2010    Hypertension flowsheet reviewed?: Yes   Progress toward BP goal: At goal  Self-Management Support :   Personal Goals (by the next clinic visit) :      Personal blood pressure goal: 140/90  (01/07/2010)     Personal LDL goal: 100  (03/05/2010)    Patient will work on the following items until the next clinic visit to reach self-care goals:     Medications and monitoring: take my medicines every day, check my blood pressure, bring all of my medications to every visit  (03/05/2010)     Eating: drink diet soda or water instead of juice or soda, eat more vegetables, use fresh or frozen vegetables, eat baked foods instead of fried foods, eat fruit for snacks and desserts, limit or avoid alcohol  (03/05/2010)     Activity: take a 30 minute walk every day  (03/05/2010)    Hypertension self-management support: BP self-monitoring log, Written self-care plan, Education handout   (01/07/2010)    Hypertension self-management support not done because: Good outcomes  (03/05/2010)    Lipid self-management support: Written self-care plan, Education handout  (03/05/2010)   Lipid self-care plan printed.   Lipid education handout printed

## 2011-01-19 NOTE — Progress Notes (Signed)
Summary: refill  Phone Note Refill Request Call back at Home Phone [phone] Message from:  Patient  Refills Requested: Medication #1:  HYDROCHLOROTHIAZIDE 25 MG  TABS Take 1 tab by mouth every morning pt is now out.  Initial call taken by: De Nurse,  April 13, 2010 8:46 AM    Prescriptions: HYDROCHLOROTHIAZIDE 25 MG  TABS (HYDROCHLOROTHIAZIDE) Take 1 tab by mouth every morning  #31 x 6   Entered and Authorized by:   Bobby Rumpf  MD   Signed by:   Bobby Rumpf  MD on 04/14/2010   Method used:   Electronically to        [facility] [address] [id number]* (retail)       [address] [place], [place]  [address]       Ph: [phone]       Fax: [phone]   RxID:   [id number]

## 2011-01-19 NOTE — Progress Notes (Signed)
Summary: Rx Req - (Orlistat)  Phone Note Call from Patient Call back at Home Phone [phone]   Caller: Patient Summary of Call: Pt looked over [facility] paperwork and has decided to try it this month.  Can we go ahead and call it into [facility] on [address]  Also what multivitamin should she take? Initial call taken by: Clydell Hakim,  March 09, 2010 10:18 AM  Follow-up for Phone Call        Filled Alli script. Advised patient to take daily multivitamin w/ A,D,E,K. Follow up as scheduled. Reviewed risks and benefits.  Follow-up by: Bobby Rumpf  MD,  March 09, 2010 1:56 PM    New/Updated Medications: ALLI 60 MG CAPS (ORLISTAT) one tab by mouth three times a day Prescriptions: ALLI 60 MG CAPS (ORLISTAT) one tab by mouth three times a day  #90 x 3   Entered and Authorized by:   Bobby Rumpf  MD   Signed by:   Bobby Rumpf  MD on 03/09/2010   Method used:   Electronically to        [facility] [address] [id number]* (retail)       [address]       [place], [place]  [address]       Ph: [phone]       Fax: [phone]   RxID:   [id number]

## 2011-01-19 NOTE — Letter (Signed)
Summary: Generic Letter  [facility]  [address]   [place], [place] [address]   Phone: [phone]  Fax: [phone]    03/17/2010  [place] [address] [place], [place]  [address]   To whom it may concern:  This letter is to certify medical necessity for the medication Xenical (generic name: Orlistat) for the above-mentioned patient. Given the patient's history of morbid obesity, I believe that this medication will, at this time, be of immense benefit in helping the patient reach her weight loss goals, thus providing long term benefits with regards to her overall health. Please direct any questions to my office either by mail, telephone or fax.  Thank you.      Sincerely,   Bobby Rumpf  MD  Appended Document: Generic Letter mailed.  Appended Document: Generic Letter letter faxed to Monia Pouch [phone]  Appended Document: Generic Letter the med has been denied. forms from her insurance to pcp

## 2011-01-19 NOTE — Progress Notes (Signed)
Summary: Rx Req Note  Phone Note Call from Patient Call back at Home Phone [phone]   Caller: Patient Summary of Call: Pt says that a letter needs to be sent to her ins co and get an approval for the medication.  It should be in her previous phone notes.  They need something stating that it is necessary for her to take it.  [phone].   Initial call taken by: Clydell Hakim,  March 17, 2010 2:18 PM  Follow-up for Phone Call        Letter written. Please send  Follow-up by: Bobby Rumpf  MD,  March 17, 2010 2:59 PM  Additional Follow-up for Phone Call Additional follow up Details #1::        prior auth form required as well. form in md chart box.Golden Circle RN  March 18, 2010 1:39 PM  Additional Follow-up by: Golden Circle RN,  March 18, 2010 1:39 PM    Additional Follow-up for Phone Call Additional follow up Details #2::    Prior auth filled and signed. Letter signed. In to be faxed pile Follow-up by: Bobby Rumpf  MD,  March 19, 2010 9:01 AM  Additional Follow-up for Phone Call Additional follow up Details #3:: Details for Additional Follow-up Action Taken: Faxed. Additional Follow-up by: Clydell Hakim,  March 19, 2010 11:23 AM

## 2011-01-19 NOTE — Miscellaneous (Signed)
Summary: Asthma QI  Clinical Lists Changes  Problems: Removed problem of FINGER PAIN (ICD-729.5) Changed problem from ASTHMA, UNSPECIFIED (ICD-493.90) to ASTHMA, INTERMITTENT (ICD-493.90) Medications: Removed medication of TESSALON PERLES 100 MG CAPS (BENZONATATE) 1 cap by mouth three times a day as needed cough

## 2011-01-19 NOTE — Progress Notes (Signed)
Summary: needs referral  Phone Note Call from Patient Call back at [phone]   Caller: Patient Summary of Call: pt is going back to school - needs a referral to be tested for dyslexia has medicaid Initial call taken by: De Nurse,  September 25, 2010 10:01 AM  Follow-up for Phone Call        Called patient. Patient has already self-referred to outpatient psychiatrist for testing. She was wondering if she needed a referral from me - advised her that she did not need a referral - if they needed information from me they could call me. She understands this plan as presented.  Follow-up by: Bobby Rumpf  MD,  September 29, 2010 11:45 AM

## 2011-01-19 NOTE — Progress Notes (Signed)
Summary: meds prob  Phone Note Call from Patient Call back at Home Phone [phone]   Caller: Patient Summary of Call: needs to send info to insurance company - [employer]- in order for them to pay for her Alli meds Roberta [name] - fax [phone] phn# [phone] Initial call taken by: De Nurse,  March 10, 2010 2:22 PM  Follow-up for Phone Call        form done & to md. will need a cover letter outlining why they should pay for this & her previous attempts Follow-up by: Golden Circle RN,  March 10, 2010 2:30 PM  Additional Follow-up for Phone Call Additional follow up Details #1::        See next note.  Additional Follow-up by: Bobby Rumpf  MD,  March 16, 2010 3:23 PM

## 2011-01-19 NOTE — Letter (Signed)
Summary: Generic Letter  [facility]  [address]   [place], [place] [address]   Phone: [phone]  Fax: [phone]    01/09/2010  Roberta [name] [address] [place], [place]  [address]  To whom it may concern:  Please note that the above-named is my patient. She has recently undergone cervical spine surgery and is currently recovering from said procedure. As such she has limited mobility in her neck and therefore is unable to drive to her appointments. As a result, she will require your transportation services to get to and from her appointments on days on which no other transport is available to her, until her recovery is complete. Please feel free to direct any questions to the number above.   Thank you for your consideration in this matter.             Sincerely,   Bobby Rumpf  MD  Appended Document: Generic Letter faxed to [phone] Transportation Service.

## 2011-02-08 ENCOUNTER — Ambulatory Visit (INDEPENDENT_AMBULATORY_CARE_PROVIDER_SITE_OTHER): Payer: Medicare Other | Admitting: Family Medicine

## 2011-02-08 ENCOUNTER — Encounter: Payer: Self-pay | Admitting: Family Medicine

## 2011-02-08 VITALS — BP 124/81 | HR 90 | Temp 97.8°F | Ht 64.0 in | Wt 250.0 lb

## 2011-02-08 DIAGNOSIS — D649 Anemia, unspecified: Secondary | ICD-10-CM

## 2011-02-08 DIAGNOSIS — I10 Essential (primary) hypertension: Secondary | ICD-10-CM

## 2011-02-08 DIAGNOSIS — E669 Obesity, unspecified: Secondary | ICD-10-CM

## 2011-02-08 LAB — COMPREHENSIVE METABOLIC PANEL
AST: 23 U/L (ref 0–37)
Albumin: 4.3 g/dL (ref 3.5–5.2)
Alkaline Phosphatase: 71 U/L (ref 39–117)
BUN: 19 mg/dL (ref 6–23)
Potassium: 4 mEq/L (ref 3.5–5.3)
Sodium: 139 mEq/L (ref 135–145)

## 2011-02-08 LAB — IRON AND TIBC
%SAT: 17 % — ABNORMAL LOW (ref 20–55)
Iron: 66 ug/dL (ref 42–145)
TIBC: 387 ug/dL (ref 250–470)
UIBC: 321 ug/dL

## 2011-02-08 LAB — CBC WITH DIFFERENTIAL/PLATELET
Eosinophils Absolute: 0.2 10*3/uL (ref 0.0–0.7)
Eosinophils Relative: 2 % (ref 0–5)
Lymphs Abs: 3.9 10*3/uL (ref 0.7–4.0)
MCH: 25.6 pg — ABNORMAL LOW (ref 26.0–34.0)
MCV: 84.5 fL (ref 78.0–100.0)
Monocytes Absolute: 0.4 10*3/uL (ref 0.1–1.0)
Monocytes Relative: 7 % (ref 3–12)
Platelets: 383 10*3/uL (ref 150–400)
RBC: 4.77 MIL/uL (ref 3.87–5.11)

## 2011-02-08 NOTE — Progress Notes (Signed)
  Subjective:    Patient ID: Roberta [name], female    DOB: April 12, 1956, 55 y.o.   MRN: [id number]  HPI  1. Hypertension Blood pressure at home: unknown Blood pressure today: 124/81 Taking Meds:yes HCTZ and lisinopril Side effects:no ROS: Denies headache visual changes nausea, vomiting, chest pain or abdominal pain or shortness of breath.  2.  Anemia- Pt states she has had a hx of it  Pt has been tired but has started school studying culinary arts and art. Pt just feels more drain then usual.   Preventative mammogram done, pap scheduled next week colonoscopy due in 2 years.    Review of Systems see above.   Objective:   Physical Exam General Appearance:    Alert, cooperative, no distress, appears stated age obese  Head:    Normocephalic, without obvious abnormality, atraumatic  Eyes:    PERRL, conjunctiva/corneas clear, EOM's intact,         Throat:   Lips, mucosa, and tongue normal; teeth and gums normal  Neck:   Supple, symmetrical, trachea midline, no adenopathy;    thyroid:  no enlargement/tenderness/nodules;     Lungs:     Clear to auscultation bilaterally, respirations unlabored      Heart:    Regular rate and rhythm, S1 and S2 normal, no murmur, rub   or gallop     Abdomen:     Soft, non-tender, bowel sounds active all four quadrants,    no masses, no organomegaly        Extremities:   trace edema b/l Right ankle > left  Pulses:   2+ and symmetric all extremities                    Assessment & Plan:

## 2011-02-08 NOTE — Assessment & Plan Note (Signed)
Pt is at goal and well controlled would like to be off meds at some point, working hard to lose weight.  Will get labs today for annual. Continue meds as well for now will consider decreasing in 3 months if pt still doing well.

## 2011-02-08 NOTE — Assessment & Plan Note (Signed)
With pt fatigue will check and see what pt baseline is and if need iron replacement.

## 2011-02-08 NOTE — Assessment & Plan Note (Signed)
Pt continues to attempt to lose weight and make lifestyle changes encouraged to keep up the good work.

## 2011-02-18 ENCOUNTER — Other Ambulatory Visit: Payer: Self-pay | Admitting: Family Medicine

## 2011-02-18 NOTE — Telephone Encounter (Signed)
Please review and refill

## 2011-03-23 LAB — CBC
MCHC: 32.9 g/dL (ref 30.0–36.0)
Platelets: 286 10*3/uL (ref 150–400)
RDW: 14.6 % (ref 11.5–15.5)

## 2011-03-23 LAB — URINE MICROSCOPIC-ADD ON

## 2011-03-23 LAB — COMPREHENSIVE METABOLIC PANEL
ALT: 17 U/L (ref 0–35)
Albumin: 3.3 g/dL — ABNORMAL LOW (ref 3.5–5.2)
Alkaline Phosphatase: 82 U/L (ref 39–117)
Calcium: 9.2 mg/dL (ref 8.4–10.5)
GFR calc Af Amer: >60 mL/min (ref >60)
Potassium: 4.3 mEq/L (ref 3.5–5.1)
Sodium: 140 mEq/L (ref 135–145)
Total Protein: 7 g/dL (ref 6.0–8.3)

## 2011-03-23 LAB — APTT: aPTT: 25 seconds (ref 24–37)

## 2011-03-23 LAB — TYPE AND SCREEN
ABO/RH(D): B POS
Antibody Screen: NEGATIVE

## 2011-03-23 LAB — DIFFERENTIAL
Basophils Relative: 1 % (ref 0–1)
Eosinophils Absolute: 0.1 10*3/uL (ref 0.0–0.7)
Lymphs Abs: 2.4 10*3/uL (ref 0.7–4.0)
Monocytes Absolute: 0.4 10*3/uL (ref 0.1–1.0)
Monocytes Relative: 8 % (ref 3–12)
Neutrophils Relative %: 41 % — ABNORMAL LOW (ref 43–77)

## 2011-03-23 LAB — URINALYSIS, ROUTINE W REFLEX MICROSCOPIC
Bilirubin Urine: NEGATIVE
Ketones, ur: NEGATIVE mg/dL
Leukocytes, UA: NEGATIVE
Nitrite: NEGATIVE
Protein, ur: NEGATIVE mg/dL
pH: 5.5 (ref 5.0–8.0)

## 2011-03-23 LAB — PROTIME-INR: INR: 0.98 (ref 0.00–1.49)

## 2011-03-26 LAB — DIFFERENTIAL
Basophils Absolute: 0 10*3/uL (ref 0.0–0.1)
Basophils Relative: 1 % (ref 0–1)
Eosinophils Absolute: 0.1 10*3/uL (ref 0.0–0.7)
Eosinophils Relative: 2 % (ref 0–5)
Neutrophils Relative %: 37 % — ABNORMAL LOW (ref 43–77)

## 2011-03-26 LAB — URINALYSIS, ROUTINE W REFLEX MICROSCOPIC
Bilirubin Urine: NEGATIVE
Glucose, UA: NEGATIVE mg/dL
Ketones, ur: NEGATIVE mg/dL
Protein, ur: NEGATIVE mg/dL

## 2011-03-26 LAB — COMPREHENSIVE METABOLIC PANEL
ALT: 19 U/L (ref 0–35)
AST: 20 U/L (ref 0–37)
CO2: 28 mEq/L (ref 19–32)
Chloride: 103 mEq/L (ref 96–112)
Creatinine, Ser: 1.06 mg/dL (ref 0.4–1.2)
GFR calc Af Amer: >60 mL/min (ref >60)
GFR calc non Af Amer: 54 mL/min — ABNORMAL LOW (ref >60)
Glucose, Bld: 98 mg/dL (ref 70–99)
Sodium: 138 mEq/L (ref 135–145)
Total Bilirubin: 0.4 mg/dL (ref 0.3–1.2)

## 2011-03-26 LAB — PROTIME-INR
INR: 0.9 (ref 0.00–1.49)
Prothrombin Time: 12.2 seconds (ref 11.6–15.2)

## 2011-03-26 LAB — CBC
Hemoglobin: 12 g/dL (ref 12.0–15.0)
MCHC: 32.2 g/dL (ref 30.0–36.0)
MCV: 83.4 fL (ref 78.0–100.0)
RBC: 4.47 MIL/uL (ref 3.87–5.11)
WBC: 6 10*3/uL (ref 4.0–10.5)

## 2011-03-26 LAB — TYPE AND SCREEN: Antibody Screen: NEGATIVE

## 2011-03-26 LAB — URINE MICROSCOPIC-ADD ON

## 2011-03-26 LAB — ABO/RH: ABO/RH(D): B POS

## 2011-05-04 NOTE — Procedures (Signed)
NAME:  Roberta [name], Roberta [name] NO.:  [id number]   MEDICAL RECORD NO.:  [id number]          PATIENT TYPE:  OUT   LOCATION:  SLEEP CENTER                 FACILITY[facility]   PHYSICIAN:  Clinton D. Maple Hudson, MD, FCCP, FACPDATE OF BIRTH:  30-Nov-1956   DATE OF STUDY:  12/30/2007                            NOCTURNAL POLYSOMNOGRAM   REFERRING PHYSICIAN:  Benn Moulder, M.D.   INDICATION FOR STUDY:  Insomnia with sleep apnea.  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  Description of night terror  sudden awakening and sleep talking.   EPWORTH SLEEPINESS SCORE:   MEDICATIONS:  Home medications charted and reviewed.   SLEEP ARCHITECTURE:  Total sleep time 253 minutes with sleep efficiency  59.9%.  Stage I was 31%, stage II 65%, stage III 1.4%, REM 2.4% of total  sleep time.  Sleep latency 39 minutes, REM latency 161 minutes, awake  after sleep onset 131 minutes, arousal index 2.4.  No bedtime medication  was taken.  Lights out at 2221 with sleep latency noted at 39 minutes.  There was infrequent waking at intervals around midnight and sustained  wakefulness from 0230 until 0430 hours, nonspecific.  No bedtime  medication taken.   RESPIRATORY DATA:  No significant respiratory events were noted, AHI 0  per hour.   OXYGEN DATA:  Moderately loud snoring with oxygen desaturation to a  nadir of 89%.  Mean oxygen saturation through the study was 95.2% on  room air.  A total of 5.7 minutes were spent with oxygen saturation less  than 88%.   CARDIAC DATA:  Normal sinus rhythm.   MOVEMENT-PARASOMNIA:  No scored movement disturbance.  Bathroom x1.   IMPRESSIONS-RECOMMENDATIONS:  1. Difficulty initiating and maintaining sleep, nonspecific,      consistent with insomnia syndrome and not associated with defined      respiratory or movement events.  2. She is on a beta-blocker.  As a class, beta-blockers have been      associated with depression, nightmares and sleep   disturbance in some people.  3. No significant respiratory or movement disturbance, apnea-hypopnea      index 0 per hour.      Clinton D. Maple Hudson, MD, [facility], FACP  Diplomate, Biomedical engineer of Sleep Medicine  Electronically Signed     CDY/MEDQ  D:  01/07/2008 15:36:17  T:  01/07/2008 16:29:46  Job:  [id number]

## 2011-05-07 NOTE — Op Note (Signed)
NAMESALEAH, [name] NO.:  [id number]   MEDICAL RECORD NO.:  [id number]                   PATIENT TYPE:  AMB   LOCATION:  [facility]SC                                  FACILITY:  [facility]   PHYSICIAN:  Leonie Man, M.D.                DATE OF BIRTH:  April 07, 1956   DATE OF PROCEDURE:  01/30/2004  DATE OF DISCHARGE:                                 OPERATIVE REPORT   PREOPERATIVE DIAGNOSIS:  Hydradenitis of the right submammary area.   POSTOPERATIVE DIAGNOSIS:  Hydradenitis of the right submammary area.   PROCEDURE:  Excision of skin lesion, right submammary area measuring (7.5  cm).   SURGEON:  Leonie Man, M.D.   ASSISTANT:  Nurse.   ANESTHESIA:  MAC. I used 0.5% Marcaine with epinephrine 1:200,000.   Note this patient is a rather obese 55 year old woman who presents with  draining lesions in the submammary area which looks as if they are inflamed  sweat glands and consistent with hydradenitis.  She comes to the operating  room for excision of these lesions as they had not healed after multiple  attempts with antibiotics and other topical dressings.  She understands the  risks and benefits of surgery and consents.   DESCRIPTION OF PROCEDURE:  Following the induction of satisfactory sedation  with the patient positioned supinely, the right breast and submammary area  are prepped and draped to be included in the sterile operative field. I  infiltrated the area with 0.5% Marcaine with epinephrine and an elliptical  incision around the entire lesion, deepening this through the skin and  subcutaneous tissue and raising a flap somewhat more superiorly so as to  excise the entire lesion.  This was then excised from the subcutaneous  tissues below and removed and forwarded for pathologic evaluation.  Sponge  and instrument sharp counts were verified and hemostasis was assured with  electrocautery.  The subcutaneous tissues were reapproximated with  interrupted 3-0 Vicryl sutures and the skin was closed with a running 4-0  Monocryl suture and then reinforced with Steri-Strips, sterile dressings  applied.  Anesthetic reversed, the patient removed from the operating room  to the recovery room in stable condition. She tolerated the procedure well.                                               Leonie Man, M.D.    PB/MEDQ  D:  01/30/2004  T:  01/30/2004  Job:  [id number]

## 2011-05-07 NOTE — H&P (Signed)
[facility]  Patient:    Roberta [name], Roberta [name] Visit Number: [id number] MRN: [id number]          Service Type: Attending:  Naima A. Normand Sloop, M.D. Dictated by:   Wynelle Bourgeois, CNM                           History and Physical  HISTORY OF PRESENT ILLNESS:   This is a 55 year old, G4, P4-0-0-4, who is not pregnant, who presents with complaints of heavy vaginal bleeding since March 13, 2002.  She reports soaking through multiple pads and towels and using a tampon every two to three hours.  She was seen a week ago in the office for the same issue, and an ultrasound was ordered for tomorrow.  She was not placed on any medications at that time.  She states that her hemoglobin a week ago was 11.  PAST OBSTETRICAL HISTORY:     Remarkable for four vaginal deliveries.  PAST MEDICAL HISTORY:         Remarkable or history of anemia, history of fibroids, history of herniated disk in lumbar spine, history of hypertension, history of pneumonia, and a questionable history of deep venous thrombosis. She reports a history of varicose veins but claims she was on heparin and Coumadin for three months within this past year.  PAST SURGICAL HISTORY:        Remarkable for a cholecystectomy in 2000, D&C in 1987.  ALLERGIES:                    CODEINE gives her cramps.  MEDICATIONS:                  Prednisone, iron, and Normodyne, and Flexeril and Darvocet p.r.n.  FAMILY HISTORY:               Noncontributory.  GENETIC HISTORY:              Noncontributory.  SOCIAL HISTORY:               The patient lives alone.  Her nephew was with her this evening who is involved and supportive.  She does not report any alcohol, tobacco, or drug abuse.  PHYSICAL EXAMINATION:  VITAL SIGNS:                  Temperature 99.1, pulse 97, respirations 20, blood pressure 128/71.  Sitting up, blood pressure was 113/61 with a pulse of 115, which was an increase of 15 beats from the lying  to the sitting position. The patient does report dizziness.  HEENT:                        Within normal limits.  NECK:                         Thyroid normal, not enlarged.  CHEST:                        Clear to auscultation bilaterally.  HEART:                        Regular rate and rhythm.  ABDOMEN:                      Nontender, soft, obese.  No masses.  GU:  External genitalia within normal limits.  Vagina shows moderate amount of clotted blood in the vault.  Cervix is multiparous, closed, and firm.  Uterus is about 14 to 16-week size with irregular borders consistent with fibroids.  Adnexa nontender and no masses bilaterally.  EXTREMITIES:                  Within normal limits.  LABORATORY DATA:              White blood cell count 9.4, hemoglobin 7.2, platelets 424.  Serum pregnancy test was negative.  There was a hold clot pending.  ASSESSMENT:                   Menorrhagia, probably secondary to fibroids.  PLAN:                         1. The patient seen by Dr. Normand Sloop.  The patient                                  was offered transfusion and declined.                                  Risks and benefits were reviewed by                                  Dr. Normand Sloop.                               2. Repeat CBC in the morning.                               3. ______ count.                               4. Reevaluate in the morning per MD.                               5. Plans will most likely include progestins and                                  endometrial biopsy.  Further orders per MD.  Dictated by:   Wynelle Bourgeois, CNM Attending:  Naima A. Normand Sloop, M.D. DD:  03/26/02 TD:  03/26/02 Job: [id number]

## 2011-05-07 NOTE — Op Note (Signed)
Roberta [name], Roberta [name]              ACCOUNT NO.:  [id number]   MEDICAL RECORD NO.:  [id number]          PATIENT TYPE:  AMB   LOCATION:  ENDO                         FACILITY:  [facility]   PHYSICIAN:  John C. Madilyn Fireman, M.D.    DATE OF BIRTH:  Jul 17, 1956   DATE OF PROCEDURE:  04/28/2005  DATE OF DISCHARGE:                                 OPERATIVE REPORT   INDICATIONS FOR PROCEDURE:  Average risk colon cancer screening.   PROCEDURE:  The patient was placed in the left lateral decubitus position  and placed on pulse monitor with continuous low-flow oxygen delivered by  nasal cannula. She was sedated with 50 mcg IV fentanyl 7.5 mg IV Versed.  Olympus video colonoscope was inserted into the rectum and advanced to  cecum, confirmed by transillumination of McBurney's point and visualization  of ileocecal valve and appendiceal orifice. Prep was excellent. The cecum,  ascending and transverse colon all appeared normal with no masses, polyps,  diverticula or other mucosal abnormalities. In the descending colon, there  was seen a 4 mm sessile polyp that was fulgurated by hot biopsy. The  remainder the descending, sigmoid and rectum appeared normal. Retroflexed  view of the anus revealed no obvious internal hemorrhoids. The scope was  then withdrawn and the patient returned to the recovery room in stable  condition. She tolerated procedure well and there were no immediate  complications.   IMPRESSION:  1.  Small descending colon polyp.   PLAN:  Await histology to determine method and interval for future colon  screening.      JCH/MEDQ  D:  04/28/2005  T:  04/28/2005  Job:  [id number]   cc:   Georgina Peer, M.D.  [address] [place], [place] [address]  Fax: [phone]

## 2011-05-07 NOTE — Op Note (Signed)
[name], [name] NO.:  [id number]   MEDICAL RECORD NO.:  [id number]          PATIENT TYPE:  AMB   LOCATION:  DSC                          FACILITY:  [facility]   PHYSICIAN:  Deidre Ala, M.D.    DATE OF BIRTH:  1956/11/27   DATE OF PROCEDURE:  06/16/2006  DATE OF DISCHARGE:                                 OPERATIVE REPORT   PREOPERATIVE DIAGNOSIS:  Left carpal tunnel syndrome, severe.   POSTOPERATIVE DIAGNOSIS:  Left carpal tunnel syndrome, severe.   PROCEDURE:  Left carpal tunnel release.   SURGEON:  1.  Charlesetta Shanks, M.D.   ASSISTANT:  Clarene Reamer, P.A.-C.   ANESTHESIA:  IV regional.   CULTURES:  None.   DRAINS:  None.   ESTIMATED BLOOD LOSS:  Minimal.   TOURNIQUET TIME:  34 minutes.   PATHOLOGIC FINDINGS AND HISTORY:  Roberta [name] has had bilateral carpal tunnel  syndrome.  She had the right done in December 2006 with excellent results.  She had positive nerve conduction studies on the left January 12, 2006.  She  has failed conservative treatment and desired release.  At surgery there was  classic pinching of the nerve underneath a thickened transverse carpal  ligament.  All branches were traced distally, including the motor branch.   PROCEDURE:  With adequate anesthesia obtained using IV regional technique, 1  g Ancef given IV prophylaxis, the patient is placed in the supine position.  The left upper extremity was prepped from the fingertips to the tourniquet  in standard fashion.  After standard prepping and draping, an incision was  made longitudinal at the thumb flexion crease at the base of the palm to the  distal wrist flexion crease.  Incision was deepened sharply with a knife and  hemostasis obtained using the Bovie electrocoagulator.  Under loupe  magnification dissection was carried down to the palmar fascia.  With use of  a Therapist, nutritional to protect the nerve, incision was carried out through the  transverse carpal ligament and the  palmar fascia, exposing the nerve using a  64 Beaver blade.  Careful neurolysis was then carried out with tenotomy  scissors and release was carried out on the ulnar side of the carpal tunnel  and up the distal forearm.  Branches were traced distally, including the  motor branch, with volar epineurectomy carried out.  Irrigation was then  carried out, bleeding points cauterized.  The wound was then closed with  running and interrupted 4-0 nylon sutures.  A bulky sterile compressive  dressing was applied with a volar splint in slight cock-up with Ace  bandage.  We had supplemented the skin edges with 1% Xylocaine.  Then then,  having tolerated the procedure well, was awakened and taken to the recovery  room in satisfactory condition, where she will be discharged per outpatient  routine, given Percocet for pain and told to call the office for an  appointment for recheck next Thursday.           ______________________________  V. Charlesetta Shanks, M.D.     VEP/MEDQ  D:  06/16/2006  T:  06/16/2006  Job:  [id number]

## 2011-05-07 NOTE — Discharge Summary (Signed)
[facility]  Patient:    Roberta [name], Roberta [name] Visit Number: [id number] MRN: [id number]          Service Type: EMS Location: [facility] Attending Physician:  Cathren Laine Dictated by:   Henreitta Leber, P.A. Admit Date:  03/31/2002 Discharge Date: 03/31/2002                             Discharge Summary  DISCHARGE DIAGNOSES:          1. Menorrhagia.                               2. Severe anemia.  HISTORY OF PRESENT ILLNESS:   Roberta [name] is a 55 year old female para 4-0-0-4 with a history of uterine fibroids and severe anemia presenting with heavy vaginal bleeding since March 13, 2002.  The patient became symptomatic due to her blood loss and was admitted for stabilization.  Please see the patients dictated history and physical examination for details.  PHYSICAL EXAMINATION:  VITAL SIGNS:                  Blood pressure 128/71, pulse 97, respirations 20, temperature 99.1 degrees Fahrenheit.  The patient was orthostatic at the time of admission.  GENERAL:                      Within normal limits.  PELVIC:                       EGBUS is within normal limits.  Vagina shows moderate amounts of clotted blood in the vault.  Cervix is multiparous, closed, and firm.  Uterus 14-16 week size with irregular borders, consistent with fibroids.  Adnexa without tenderness or masses bilaterally.  LABORATORY DATA:              Hemoglobin at admission was 7.2.  Serum pregnancy test was negative.  HOSPITAL COURSE:              On the date of admission, the patient was offered multiple treatments for her symptoms to include blood transfusion, IV estrogen, Aygestin, and hysterectomy.  The patient initially declined all options; however, at the time of discharge, she consented to a course of Aygestin.  Discharge hemoglobin was 6.4 with the patient willing to accept the risks associated with her hypovolemic state.  The patient also is to have an endometrial biopsy done  as an outpatient and will further consider her options for long-term management.  DISCHARGE MEDICATIONS:        1. Iron 325 mg 1 tablet three times daily.                               2. Aygestin 10 mg 1 tablet twice daily.  FOLLOWUP:                     The patient is to call [facility] for four weeks followup with Dr. Stefano Gaul.  DISCHARGE INSTRUCTIONS:       She is to call the office with problems or any temperature greater than 101 degrees Fahrenheit.  She is to ambulate with assistance, consume a low-fat diet, and lots of green vegetables. Dictated by:   Henreitta Leber, P.A. Attending Physician:  Cathren Laine DD:  04/23/02 TD:  04/26/02 Job: [id number]

## 2011-05-10 ENCOUNTER — Other Ambulatory Visit: Payer: Self-pay | Admitting: Family Medicine

## 2011-05-10 NOTE — Telephone Encounter (Signed)
Refill request

## 2011-05-21 ENCOUNTER — Ambulatory Visit (INDEPENDENT_AMBULATORY_CARE_PROVIDER_SITE_OTHER): Payer: Medicare Other | Admitting: Family Medicine

## 2011-05-21 ENCOUNTER — Encounter: Payer: Self-pay | Admitting: Family Medicine

## 2011-05-21 DIAGNOSIS — E669 Obesity, unspecified: Secondary | ICD-10-CM

## 2011-05-21 DIAGNOSIS — I10 Essential (primary) hypertension: Secondary | ICD-10-CM

## 2011-05-21 NOTE — Patient Instructions (Signed)
Follow up in 6 months. I want you to continue to exercise. Use the [employer] web site to help keep track of calories, sodium etc.  Keep your sodium below 2500 mg a day  We may be able to take you off of one of your blood pressure medicines next time you come in if you continue to lose weight  I have enjoyed being your physician! Best wishes!  - Dr. Wallene Huh

## 2011-05-21 NOTE — Progress Notes (Signed)
  Subjective:    Patient ID: Roberta [name], female    DOB: 1956-05-15, 55 y.o.   MRN: [id number]  HPI  1) Obesity: Weight 251 lbs today (down from 262 on 12/28/09; max of 273 in 2008), up from 247 lbs one year ago. She had been losing weight steadily through working with a trainer at the gym 2 hours a day with elliptical machine, treadmill and light arm weights (max 5 lbs), but injured her ankle several months ago, requiring surgery.  Had eliminated fried foods and fast foods and had been having 4 small meals per day, but with starting school she has strayed away from this somewhat.  Had increased fruits and vegetables and whole grains and decreased salt, but this has also been interrupted by shcool. Had stopped taking diet pills at my recommendation as per last visit. Was unable to get Alli paid for through insurance.   2) HTN: BP 139/88 today; at last visit was 108/79. She had improved without additional medications since starting exercise and changing diet, but has recently been unable to exercise due to injury and has been eating more salty foods and processed foods since starting school . Taking all medication as prescribed. Denies LE edema, dyspnea, exertional chest pain, neurological symptoms.   Past medical history reviewed   Review of Systems As per HPI     Objective:   Physical Exam        Assessment & Plan:

## 2011-05-22 NOTE — Assessment & Plan Note (Signed)
Counseled on restarting dietary modifications, restarting exercise when able. Follow in 6 months.

## 2011-05-22 NOTE — Assessment & Plan Note (Signed)
At goal (though deteriorated as compared to last visit). Would like to be off meds at some point, had been working hard to lose weight.  Continue meds as well for now will consider decreasing in 6 months improved.

## 2011-08-18 ENCOUNTER — Other Ambulatory Visit: Payer: Self-pay | Admitting: Family Medicine

## 2011-08-18 NOTE — Telephone Encounter (Signed)
[facility] on [address] is requesting 90 day refill on Nexium.  Because that is different than how it was written, they told the patient she would need to call.  Please give her a call when this is done.

## 2011-08-19 ENCOUNTER — Telehealth: Payer: Self-pay | Admitting: Family Medicine

## 2011-08-19 MED ORDER — ESOMEPRAZOLE MAGNESIUM 40 MG PO CPDR: 40.0000 mg | DELAYED_RELEASE_CAPSULE | Freq: Every day | ORAL | Status: DC

## 2011-08-19 NOTE — Telephone Encounter (Signed)
It was written as a 30 day supply,  I do not know why the Pharmacy is requestion a 90 day supply.

## 2011-08-19 NOTE — Telephone Encounter (Signed)
done

## 2011-08-19 NOTE — Telephone Encounter (Signed)
Roberta [name] need rx for her Nexium corrected to show 90 tabs instead of 30.  Rx have always been prescribed for that.

## 2011-08-19 NOTE — Telephone Encounter (Signed)
Md sent in refill Fleeger, Maryjo Rochester

## 2011-08-19 NOTE — Telephone Encounter (Signed)
LMOVM for her to call us back.  Do not understand message, please clarify Fleeger, Maryjo Rochester

## 2011-08-20 ENCOUNTER — Other Ambulatory Visit: Payer: Self-pay | Admitting: Family Medicine

## 2011-08-20 NOTE — Telephone Encounter (Signed)
She got the refill for Nexium, but not the one for Lisinopril for a 90 day supply as well.

## 2011-08-20 NOTE — Telephone Encounter (Signed)
To MD

## 2011-08-23 MED ORDER — LISINOPRIL 10 MG PO TABS: 10.0000 mg | ORAL_TABLET | Freq: Every day | ORAL | Status: DC

## 2011-09-09 ENCOUNTER — Encounter: Payer: Self-pay | Admitting: Family Medicine

## 2011-09-09 ENCOUNTER — Ambulatory Visit (INDEPENDENT_AMBULATORY_CARE_PROVIDER_SITE_OTHER): Payer: Medicare Other | Admitting: Family Medicine

## 2011-09-09 DIAGNOSIS — E669 Obesity, unspecified: Secondary | ICD-10-CM

## 2011-09-09 DIAGNOSIS — K219 Gastro-esophageal reflux disease without esophagitis: Secondary | ICD-10-CM

## 2011-09-09 DIAGNOSIS — K317 Polyp of stomach and duodenum: Secondary | ICD-10-CM

## 2011-09-09 DIAGNOSIS — I10 Essential (primary) hypertension: Secondary | ICD-10-CM

## 2011-09-09 DIAGNOSIS — D131 Benign neoplasm of stomach: Secondary | ICD-10-CM

## 2011-09-09 HISTORY — DX: Gastro-esophageal reflux disease without esophagitis: K21.9

## 2011-09-09 NOTE — Progress Notes (Signed)
  Subjective:    Patient ID: Roberta [name], female    DOB: [birth year], 55 y.o.   MRN: [id number]  HPI 1. Hypertension Blood pressure at home:not checking Blood pressure today: 138/86 Taking Meds:yes Side effects:no ROS: Denies headache visual changes nausea, vomiting, chest pain or abdominal pain or shortness of breath.  2. obesity: Patient is doing much better she is working out 4 times a week for approximately one hour. Patient does find it hard in her schedule due to her working as well as going to school full-time for 2 masters degrees. Patient is watching her weights as well as watch her sodium intake which he says has been very beneficial. Patient states that she is to her sodium she now gets a headache. Wt Readings from Last 3 Encounters:  09/09/11 246 lb 14.4 oz (111.993 kg)  05/21/11 251 lb 6.4 oz (114.034 kg)  02/08/11 250 lb (113.399 kg)     3.  reflux disease. Patient is doing very well she is taking her Nexium daily denies any type heartburn or any pain with food denies any type of changes in bowel habits denies any nausea vomiting denies any weight changes other than attempted weight loss.   Review of Systems Denies fever, chills, nausea vomiting abdominal pain, dysuria, chest pain, shortness of breath dyspnea on exertion or numbness in extremities Past medical history, social, surgical and family history all reviewed.      Objective:   Physical Exam General Appearance:    Alert, cooperative, no distress, appears stated age obese  Head:    Normocephalic, without obvious abnormality, atraumatic  Eyes:    PERRL, conjunctiva/corneas clear, EOM's intact,   Throat:   Lips, mucosa, and tongue normal; teeth and gums normal  Neck:   Supple, symmetrical, trachea midline, no adenopathy;    thyroid:  no enlargement/tenderness/nodules;  Lungs:     Clear to auscultation bilaterally, respirations unlabored   Heart:    Regular rate and rhythm, S1 and S2 normal, no murmur, rub  or gallop  Abdomen:     Soft, non-tender, bowel sounds active all four quadrants,    no masses, no organomegaly  Extremities:   trace edema b/l Right ankle > left  Pulses:   2+ and symmetric all extremities      Assessment & Plan:

## 2011-09-09 NOTE — Assessment & Plan Note (Signed)
Patient is taking Nexium and doing very well. No need to make any changes. Patient sees a GI doctor to her history of having a gastric polyp that was precancerous. We'll continue to monitor.

## 2011-09-09 NOTE — Patient Instructions (Signed)
You doing great. You goal weight is 235 pounds by next visit. Need to see again in 6 months

## 2011-09-09 NOTE — Assessment & Plan Note (Signed)
Patient continues to lose weight is doing better. Patient is being physically active as well as watching her diet well go away for 235 at next visit in 6 months time.

## 2011-09-09 NOTE — Assessment & Plan Note (Signed)
Patient is at goal is doing very well no side effects from medications. At this time will make no changes patient is due for labs in February and that is when she will return.

## 2011-09-14 LAB — POCT I-STAT, CHEM 8
Glucose, Bld: 86
HCT: 42
Hemoglobin: 14.3
Potassium: 3.9
Sodium: 142

## 2011-11-03 ENCOUNTER — Encounter: Payer: Self-pay | Admitting: Family Medicine

## 2011-11-03 ENCOUNTER — Ambulatory Visit (INDEPENDENT_AMBULATORY_CARE_PROVIDER_SITE_OTHER): Payer: Medicare Other | Admitting: Family Medicine

## 2011-11-03 DIAGNOSIS — J45909 Unspecified asthma, uncomplicated: Secondary | ICD-10-CM

## 2011-11-03 MED ORDER — ALBUTEROL SULFATE (2.5 MG/3ML) 0.083% IN NEBU: 2.5000 mg | INHALATION_SOLUTION | Freq: Four times a day (QID) | RESPIRATORY_TRACT | Status: DC | PRN

## 2011-11-04 ENCOUNTER — Other Ambulatory Visit: Payer: Self-pay | Admitting: Family Medicine

## 2011-11-04 DIAGNOSIS — I10 Essential (primary) hypertension: Secondary | ICD-10-CM

## 2011-11-04 MED ORDER — HYDROCHLOROTHIAZIDE 25 MG PO TABS: 25.0000 mg | ORAL_TABLET | Freq: Every day | ORAL | Status: DC

## 2011-11-04 NOTE — Progress Notes (Signed)
  Subjective:    Patient ID: Roberta [name], female    DOB: Jul 31, 1956, 55 y.o.   MRN: [id number]  HPI  1.  Asthma exacerbation:  Started past 2-3 days.  States she's had increased Albuterol requirement, increased wheezing and cough, especially at night.  Using Albuterol 3-4 times a day, usually doesn't have to use for weeks at a time.  States she's had severe allergic reaction to Pulmicort in past, therefore not on long-acting inhaler.  States she often comes in at this time of year for nebulizer treatment, improves, and then does well for remainder of year.  No fevers or chills.  No recent illnesses.    Review of Systems See HPI above for review of systems.       Objective:   Physical Exam Gen:  Alert, cooperative patient who appears stated age in no acute distress.  Vital signs reviewed. HEENT:  [place]/AT.  EOMI, PERRL.  MMM, tonsils non-erythematous, non-edematous.  External ears WNL, Bilateral TM's normal without retraction, redness or bulging.  Neck:  No LAD Lungs:  Wheezing bibasilar lung fields.  Some mild scattered wheezing throughout.        Assessment & Plan:

## 2011-11-04 NOTE — Telephone Encounter (Signed)
Needs refill on hctz, Pt goes to [facility].

## 2011-11-04 NOTE — Assessment & Plan Note (Signed)
Discussed with patient treatment options, especially in light of adverse reaction to budesonide. Patient desires nebulizer machine at home. Do not think she warrants neb treatment here in clinic -- no increased work of breathing, mild wheezing.   Plan to send home prescription for neb machine Strict instructions to return if any worsening or if neb machine not helping Considered steroids but she had problems with increased bleeding with Prednisone, therefore none today.

## 2012-01-13 ENCOUNTER — Encounter: Payer: Self-pay | Admitting: Family Medicine

## 2012-01-13 ENCOUNTER — Ambulatory Visit: Payer: Medicare Other | Admitting: Family Medicine

## 2012-01-13 ENCOUNTER — Ambulatory Visit (INDEPENDENT_AMBULATORY_CARE_PROVIDER_SITE_OTHER): Payer: Medicare Other | Admitting: Family Medicine

## 2012-01-13 VITALS — BP 132/84 | HR 90 | Temp 98.0°F

## 2012-01-13 DIAGNOSIS — R05 Cough: Secondary | ICD-10-CM

## 2012-01-13 MED ORDER — GUAIFENESIN-DM 100-10 MG/5ML PO SYRP: 5.0000 mL | ORAL_SOLUTION | Freq: Three times a day (TID) | ORAL | Status: AC | PRN

## 2012-01-13 MED ORDER — BENZONATATE 200 MG PO CAPS: 200.0000 mg | ORAL_CAPSULE | Freq: Three times a day (TID) | ORAL | Status: AC | PRN

## 2012-01-13 NOTE — Assessment & Plan Note (Signed)
For the past 2 weeks. Appears to be from post-nasal drip following bronchitis/viral URI. Will treat cough with Robitussin at night and Tessalon during day. Does not seem to be having asthma flare at this time but advised using albuterol prn and given red flags to return to clinic.  Follow-up as needed or next week if symptoms persistent.

## 2012-01-13 NOTE — Progress Notes (Signed)
  Subjective:    Patient ID: Roberta [name], female    DOB: 05-31-1956, 56 y.o.   MRN: [id number]  HPI Patient with cough and congestion for the past 2 weeks.  Congestion is improving but cough is persistent. Worse at night. Denies fevers/chills or muscle aches. Complaining of mild diffuse headache.  Denies difficulty breathing or wheezing. Used her albuterol neb yesterday but not today.  Review of Systems Per HPI with inclusion of following: denies nausea/vomiting.     Objective:   Physical Exam Gen: NAD HEENT:   Head: mild left maxillary tenderness   Eyes: normal conjunctiva   Nose: nasal congestion without rhinorrhea   Throat: no tonsillar adenopathy or exudates or oropharyngeal lesions   Neck: no LAD Pulm: NI WOB, CTAB without w/r/r CV: RRR    Assessment & Plan:

## 2012-01-13 NOTE — Patient Instructions (Signed)
I think your cough is likely due to a bronchitis caused by a virus. Take the Robitussin at night and the Tessalon during the day.  If you don't feel better in the next 7 days or you have difficulty breathing despite using your albuterol, please return to clinic.   It was nice to meet you today.

## 2012-03-09 ENCOUNTER — Ambulatory Visit (INDEPENDENT_AMBULATORY_CARE_PROVIDER_SITE_OTHER): Payer: Medicare Other | Admitting: Obstetrics and Gynecology

## 2012-03-09 DIAGNOSIS — N926 Irregular menstruation, unspecified: Secondary | ICD-10-CM

## 2012-03-17 ENCOUNTER — Other Ambulatory Visit: Payer: Self-pay | Admitting: Family Medicine

## 2012-03-17 DIAGNOSIS — K219 Gastro-esophageal reflux disease without esophagitis: Secondary | ICD-10-CM

## 2012-03-17 MED ORDER — ESOMEPRAZOLE MAGNESIUM 40 MG PO CPDR: 40.0000 mg | DELAYED_RELEASE_CAPSULE | Freq: Every day | ORAL | Status: DC

## 2012-04-19 ENCOUNTER — Ambulatory Visit: Payer: Medicare Other | Admitting: Family Medicine

## 2012-04-21 ENCOUNTER — Encounter: Payer: Self-pay | Admitting: Family Medicine

## 2012-04-21 ENCOUNTER — Ambulatory Visit (INDEPENDENT_AMBULATORY_CARE_PROVIDER_SITE_OTHER): Payer: Medicare Other | Admitting: Family Medicine

## 2012-04-21 VITALS — BP 136/90 | HR 88 | Temp 98.1°F | Ht 65.0 in | Wt 266.0 lb

## 2012-04-21 DIAGNOSIS — D649 Anemia, unspecified: Secondary | ICD-10-CM

## 2012-04-21 DIAGNOSIS — E785 Hyperlipidemia, unspecified: Secondary | ICD-10-CM

## 2012-04-21 DIAGNOSIS — R5381 Other malaise: Secondary | ICD-10-CM

## 2012-04-21 DIAGNOSIS — R5383 Other fatigue: Secondary | ICD-10-CM

## 2012-04-21 DIAGNOSIS — I10 Essential (primary) hypertension: Secondary | ICD-10-CM

## 2012-04-21 DIAGNOSIS — E669 Obesity, unspecified: Secondary | ICD-10-CM

## 2012-04-21 DIAGNOSIS — E559 Vitamin D deficiency, unspecified: Secondary | ICD-10-CM | POA: Insufficient documentation

## 2012-04-21 LAB — LIPID PANEL
Cholesterol: 203 mg/dL — ABNORMAL HIGH (ref 0–200)
Triglycerides: 100 mg/dL (ref <150)
VLDL: 20 mg/dL (ref 0–40)

## 2012-04-21 LAB — COMPREHENSIVE METABOLIC PANEL
BUN: 12 mg/dL (ref 6–23)
CO2: 31 mEq/L (ref 19–32)
Calcium: 9.3 mg/dL (ref 8.4–10.5)
Chloride: 102 mEq/L (ref 96–112)
Creat: 1.07 mg/dL (ref 0.50–1.10)

## 2012-04-21 LAB — TSH: TSH: 1.676 u[IU]/mL (ref 0.350–4.500)

## 2012-04-21 MED ORDER — METFORMIN HCL 500 MG PO TABS: 500.0000 mg | ORAL_TABLET | Freq: Two times a day (BID) | ORAL | Status: DC

## 2012-04-21 NOTE — Assessment & Plan Note (Signed)
With patient's obesity at this time in patient's change in activity level likely can be corrected fairly quickly. Discuss with patient about potential options and she did want to try potentially a small dose of metformin. Refilled metformin  followup in one month's time. We will also check thyroid.

## 2012-04-21 NOTE — Progress Notes (Signed)
  Subjective:    Patient ID: Roberta [name], female    DOB: [birth year], 56 y.o.   MRN: [id number]  HPI  1. Hypertension Blood pressure at home:not checking Blood pressure today: 138/86 Taking Meds:yes Side effects:no ROS: Denies headache visual changes nausea, vomiting, chest pain or abdominal pain or shortness of breath.  2. obesity: Patient has gained weight since last time seen. Patient states her classes at change scheduled and she was unable to work out before classing more and then did not work out on a daily basis like she was previously. Patient also is a vegetarian does not watch her diet as closely as she should. Patient is making changes is going to attend the gym. Patient declined wanting any nutrition classes but is interested in potential medications. Patient does complain of more fatigued than usual but denies any swelling or hair loss. Wt Readings from Last 3 Encounters:  04/21/12 266 lb (120.657 kg)  11/03/11 250 lb 9.6 oz (113.671 kg)  09/09/11 246 lb 14.4 oz (111.993 kg)    3.  reflux disease. Patient is doing very well she is taking her Nexium daily denies any type heartburn or any pain with food denies any type of changes in bowel habits denies any nausea vomiting denies any weight changes other than attempted weight loss.  Preventative care-patient is due for complete metabolic panel cholesterol thyroid and vitamin D. Patient is also stated that she needs a tetanus shot. Review of Systems  Denies fever, chills, nausea vomiting abdominal pain, dysuria, chest pain, shortness of breath dyspnea on exertion or numbness in extremities Past medical history, social, surgical and family history all reviewed.      Objective:   Physical Exam  vitals reviewed General Appearance:    Alert, cooperative, no distress, appears stated age obese  Head:    Normocephalic, without obvious abnormality, atraumatic  Eyes:    PERRL, conjunctiva/corneas clear, EOM's intact,   Throat:    Lips, mucosa, and tongue normal; teeth and gums normal  Neck:   Supple, symmetrical, trachea midline, no adenopathy;    thyroid:  no enlargement/tenderness/nodules; scar well healed from previous surgery.   Lungs:     Clear to auscultation bilaterally, respirations unlabored   Heart:    Regular rate and rhythm, S1 and S2 normal, no murmur, rub   or gallop  Abdomen:     Soft, non-tender, bowel sounds active all four quadrants,    no masses, no organomegaly  Extremities:   trace edema b/l Right ankle > left  Pulses:   2+ and symmetric all extremities     Assessment & Plan:

## 2012-04-21 NOTE — Assessment & Plan Note (Signed)
Minorly elevated the patient is going to try some diet and exercise, will come back in one month for recheck.

## 2012-04-21 NOTE — Assessment & Plan Note (Signed)
Check cholesterol today 

## 2012-04-21 NOTE — Patient Instructions (Signed)
It is so good to see you. We'll get some labs and I will call you with the results. Am giving you a car if you want to schedule an appointment with Dr. Verlon Setting for nutrition. I know you can lose the weight you are a very motivated person. Keep up the good work and school is 4.0!

## 2012-04-22 LAB — CBC WITH DIFFERENTIAL/PLATELET
Basophils Absolute: 0 10*3/uL (ref 0.0–0.1)
Basophils Relative: 0 % (ref 0–1)
Hemoglobin: 11.6 g/dL — ABNORMAL LOW (ref 12.0–15.0)
Lymphocytes Relative: 51 % — ABNORMAL HIGH (ref 12–46)
MCHC: 29.4 g/dL — ABNORMAL LOW (ref 30.0–36.0)
Monocytes Relative: 7 % (ref 3–12)
Neutro Abs: 2.3 10*3/uL (ref 1.7–7.7)
Neutrophils Relative %: 40 % — ABNORMAL LOW (ref 43–77)
WBC: 5.9 10*3/uL (ref 4.0–10.5)

## 2012-04-22 LAB — VITAMIN D 25 HYDROXY (VIT D DEFICIENCY, FRACTURES): Vit D, 25-Hydroxy: 26 ng/mL — ABNORMAL LOW (ref 30–89)

## 2012-04-24 ENCOUNTER — Telehealth: Payer: Self-pay | Admitting: Family Medicine

## 2012-04-24 NOTE — Telephone Encounter (Signed)
Called pt gave results told her to adjust her workout and diet to try to get cholesterol down. Pt declined vitamin D and will start iron at this time.

## 2012-04-24 NOTE — Telephone Encounter (Signed)
Message copied by Judi Saa on Mon Apr 24, 2012 12:09 PM ------      Message from: CHAMBLISS, MARSHALL L      Created: Mon Apr 24, 2012  8:37 AM                   ----- Message -----         From: [facility]         Sent: 04/21/2012   9:54 PM           To: Carney Living, MD

## 2012-05-24 ENCOUNTER — Ambulatory Visit (INDEPENDENT_AMBULATORY_CARE_PROVIDER_SITE_OTHER): Payer: Medicare Other | Admitting: Family Medicine

## 2012-05-24 ENCOUNTER — Encounter: Payer: Self-pay | Admitting: Family Medicine

## 2012-05-24 VITALS — BP 134/85 | HR 76 | Temp 98.2°F | Ht 65.0 in | Wt 258.0 lb

## 2012-05-24 DIAGNOSIS — E669 Obesity, unspecified: Secondary | ICD-10-CM

## 2012-05-24 DIAGNOSIS — I10 Essential (primary) hypertension: Secondary | ICD-10-CM

## 2012-05-24 NOTE — Progress Notes (Signed)
Patient ID: Roberta [name], female   DOB: [birth year], 56 y.o.   MRN: [id number] 1. Hypertension Blood pressure at home: 120 systolic Blood pressure today: 134/85 Taking Meds: Yes patient has started since last visit Side effects: No ROS: Denies headache visual changes nausea, vomiting, chest pain or abdominal pain or shortness of breath.  Obesity-patient has lost 8 pounds since starting her. Patient was put on metformin do to some hyperglycemia as well. Patient appears to be doing very well tolerating medication well patient is also attempted to lose weight by changing her diet and she is going to start going to the [facility] on a regular basis next week. Patient is very encouraged to continue up these changes.

## 2012-05-24 NOTE — Patient Instructions (Signed)
Patient given verbal instructions followup in 3 months with new peri-care provider.

## 2012-06-14 ENCOUNTER — Other Ambulatory Visit: Payer: Self-pay | Admitting: Family Medicine

## 2012-08-08 ENCOUNTER — Telehealth: Payer: Self-pay | Admitting: Family Medicine

## 2012-08-08 NOTE — Telephone Encounter (Signed)
Needs note for class stating that she is able to do exercise while she is on her BP meds - needs this before thurs morning pls call her to let her know if this can or cannot be done before this Thurs

## 2012-08-08 NOTE — Telephone Encounter (Signed)
Will forward to Dr. Chamberlain 

## 2012-08-09 ENCOUNTER — Encounter: Payer: Self-pay | Admitting: Family Medicine

## 2012-08-09 ENCOUNTER — Ambulatory Visit (INDEPENDENT_AMBULATORY_CARE_PROVIDER_SITE_OTHER): Payer: Medicare Other | Admitting: Family Medicine

## 2012-08-09 VITALS — BP 120/80 | HR 91 | Temp 98.1°F | Ht 65.0 in | Wt 253.0 lb

## 2012-08-09 DIAGNOSIS — I10 Essential (primary) hypertension: Secondary | ICD-10-CM

## 2012-08-09 NOTE — Progress Notes (Signed)
  Subjective:    Patient ID: Roberta [name], female    DOB: 1956-09-14, 56 y.o.   MRN: [id number]  HPI HYPERTENSION Disease Monitoring Home BP Monitoring not doing Chest pain- no     Dyspnea-  no  Medications Compliance: taking as prescribed. Lightheadedness-  no  Edema-  no   ROS - See HPI  PMH Lab Review   Potassium  Date Value Range Status  04/21/2012 4.3  3.5 - 5.3 mEq/L Final     Sodium  Date Value Range Status  04/21/2012 141  135 - 145 mEq/L Final     Able to walk up 16 steps without chest pain or shortness of breath or lightheadness.  Her knees limit her mobility some    Review of Systems     Objective:   Physical Exam Heart - Regular rate and rhythm.  No murmurs, gallops or rubs.    Lungs:  Normal respiratory effort, chest expands symmetrically. Lungs are clear to auscultation, no crackles or wheezes. Extremities:  No cyanosis, edema, or deformity noted with good range of motion of all major joints.          Assessment & Plan:

## 2012-08-09 NOTE — Patient Instructions (Addendum)
Good work with weight control and exercise  See Dr Lula Olszewski for a follow up visit as recommended

## 2012-08-09 NOTE — Assessment & Plan Note (Signed)
Well controlled.  No contraindications to structured exercise.   She will follow up for her anemia with her pcp

## 2012-08-09 NOTE — Telephone Encounter (Signed)
Called patient left VM. Fine to work out on BP medications. Letter written and placed up front for pick up.

## 2012-08-24 ENCOUNTER — Encounter: Payer: Self-pay | Admitting: Family Medicine

## 2012-08-24 ENCOUNTER — Ambulatory Visit (INDEPENDENT_AMBULATORY_CARE_PROVIDER_SITE_OTHER): Payer: Medicare Other | Admitting: Family Medicine

## 2012-08-24 VITALS — BP 143/84 | HR 86 | Temp 98.2°F | Ht 66.0 in | Wt 252.3 lb

## 2012-08-24 DIAGNOSIS — M25562 Pain in left knee: Secondary | ICD-10-CM

## 2012-08-24 DIAGNOSIS — M25569 Pain in unspecified knee: Secondary | ICD-10-CM

## 2012-08-24 NOTE — Assessment & Plan Note (Signed)
Patient may have some underlying OA, but may have had an acute injury with recent fall.  She is going to see her Orthopedist on Monday, so I will hold off on any imaging.  Corticosteroid injection performed today to help with pain, continue tramadol as needed for pain.
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
## 2012-08-24 NOTE — Patient Instructions (Signed)
It was nice to see you- I am sorry you are hurting so badly.  Please put ice on your knee tonight, and take tramadol as needed.   You can tell Dr. Renae Fickle that I did a knee injection with DepoMedrol and Lidocaine.

## 2012-11-12 ENCOUNTER — Other Ambulatory Visit: Payer: Self-pay | Admitting: Family Medicine

## 2012-11-13 ENCOUNTER — Other Ambulatory Visit: Payer: Self-pay | Admitting: Family Medicine

## 2012-11-14 ENCOUNTER — Ambulatory Visit (INDEPENDENT_AMBULATORY_CARE_PROVIDER_SITE_OTHER): Payer: Medicare Other | Admitting: Family Medicine

## 2012-11-14 ENCOUNTER — Encounter: Payer: Self-pay | Admitting: Family Medicine

## 2012-11-14 VITALS — BP 134/86 | HR 86 | Ht 66.0 in | Wt 248.0 lb

## 2012-11-14 DIAGNOSIS — E785 Hyperlipidemia, unspecified: Secondary | ICD-10-CM

## 2012-11-14 DIAGNOSIS — E669 Obesity, unspecified: Secondary | ICD-10-CM

## 2012-11-14 DIAGNOSIS — R7301 Impaired fasting glucose: Secondary | ICD-10-CM

## 2012-11-14 DIAGNOSIS — J45909 Unspecified asthma, uncomplicated: Secondary | ICD-10-CM

## 2012-11-14 DIAGNOSIS — I10 Essential (primary) hypertension: Secondary | ICD-10-CM

## 2012-11-14 MED ORDER — HYDROCHLOROTHIAZIDE 25 MG PO TABS: 25.0000 mg | ORAL_TABLET | Freq: Every day | ORAL | Status: DC

## 2012-11-14 MED ORDER — ALBUTEROL SULFATE HFA 108 (90 BASE) MCG/ACT IN AERS: 2.0000 | INHALATION_SPRAY | RESPIRATORY_TRACT | Status: DC | PRN

## 2012-11-14 MED ORDER — LISINOPRIL 10 MG PO TABS: 10.0000 mg | ORAL_TABLET | Freq: Every day | ORAL | Status: DC

## 2012-11-14 NOTE — Assessment & Plan Note (Signed)
Refill albuterol, discussed avoiding triggers.

## 2012-11-14 NOTE — Progress Notes (Signed)
  Subjective:    Patient ID: Roberta [name], female    DOB: [birth year], 56 y.o.   MRN: [id number]  HPI  HTN: Taking lisinopril and HCTZ without difficulty.  Denies chest pain, dizziness, palpitations, LE edema.  Patient is not check blood pressures.  Obesity: patient continues to exercise, but due to neck problems, is only swimming and walking right now.  She is planning on starting Zumba classes.  She self discontinued metformin due to side effects.  She says she was only on it for weight loss.  Her Orthopedist is following her for her neck problems.   HLD: LDL was 140 6 months ago, patient has los 14 lbs since then.  Not taking medications, has made dietary changes too.   Asthma: Doing overall well, did go to daughter's house last week to visit grandchildren, they were dog sitting, which flared up asthma.  She has needed albuterol once to twice a day since then but it is improving.  Non-smoker.   Past Medical History  Diagnosis Date  . Allergy   . Anemia   . Asthma   . Depression   . Hyperlipidemia   . Hypertension    Family History  Problem Relation Age of Onset  . Heart disease Father   . Hyperlipidemia Father   . Hypertension Father    History  Substance Use Topics  . Smoking status: Never Smoker   . Smokeless tobacco: Not on file  . Alcohol Use: No   Review of Systems Pertinent items in HPI    Objective:   Physical Exam BP 134/86  Pulse 86  Ht 5\' 6"  (1.676 m)  Wt 248 lb (112.492 kg)  BMI 40.03 kg/m2 General appearance: alert, cooperative and no distress Throat: lips, mucosa, and tongue normal; teeth and gums normal Lungs: clear to auscultation bilaterally Heart: regular rate and rhythm, S1, S2 normal, no murmur, click, rub or gallop Extremities: extremities normal, atraumatic, no cyanosis or edema       Assessment & Plan:

## 2012-11-14 NOTE — Assessment & Plan Note (Signed)
Progress with weight loss, congratulated and encouraged her.  Will check A1C as she has had elevated fasting sugars on prior labs and unclear if metformin was solely for weight loss.

## 2012-11-14 NOTE — Patient Instructions (Signed)
It was good to see you.  Please continue to take your blood pressure medications daily.    You have done a great job with your exercise and weight loss- you have lost 14 lbs in the past 6 months.  Great job, keep up the hard work!  I will send you a letter with your lab results, or call you if anything is abnormal.    Happy Thanksgiving!

## 2012-11-14 NOTE — Assessment & Plan Note (Signed)
Well controlled on HCTZ and lisinopril, refilled today.

## 2012-11-14 NOTE — Assessment & Plan Note (Signed)
Will check LDL today to see if weight loss and dietary changes have improved control.

## 2012-11-15 ENCOUNTER — Ambulatory Visit: Payer: Medicare Other | Admitting: Family Medicine

## 2013-01-05 ENCOUNTER — Encounter: Payer: Self-pay | Admitting: Family Medicine

## 2013-01-05 ENCOUNTER — Ambulatory Visit (INDEPENDENT_AMBULATORY_CARE_PROVIDER_SITE_OTHER): Payer: Medicare Other | Admitting: Family Medicine

## 2013-01-05 VITALS — BP 121/77 | HR 77 | Temp 98.1°F | Wt 250.6 lb

## 2013-01-05 DIAGNOSIS — R059 Cough, unspecified: Secondary | ICD-10-CM

## 2013-01-05 DIAGNOSIS — J45909 Unspecified asthma, uncomplicated: Secondary | ICD-10-CM

## 2013-01-05 DIAGNOSIS — R05 Cough: Secondary | ICD-10-CM

## 2013-01-05 NOTE — Patient Instructions (Signed)
It was good to see you today! I do not think you are having a major asthma flair.  You can keep on taking your albuterol 2-3 times per day if you think it helps with your cough. I would recommend Tylenol for your headache.  That should not cause problems with feeling nauseated and will not cause any problems with your blood pressure.

## 2013-01-22 NOTE — Assessment & Plan Note (Signed)
?   Cough variant asthma vs allergies vs resolving resp infection

## 2013-01-22 NOTE — Assessment & Plan Note (Signed)
Currently symptom free with no increased WOB.  Will provide refills on albuterol, rec continued PRN use, and RTC in 2-3 weeks for further discussion of symptoms.  May benefit from controller med if is actually using albuterol multiple times per week.

## 2013-01-22 NOTE — Progress Notes (Signed)
Patient ID: Roberta [name], female   DOB: 1956-08-31, 57 y.o.   MRN: [id number] Subjective: The patient is a 57 y.o. year old female who presents today for asthma f/u.  1. Asthma: Flair up caused by dog in house.  Has been having wheezing and chronic cough.  She has been using albuterol 8-10 times per day but has not used in in the past 4 hours.  She is running out of her albuterol.  She does not use any controller.  She has not been sleeping well for the last several days but she does not report any significant night-time awakenings.  Patient's past medical, social, and family history were reviewed and updated as appropriate. History  Substance Use Topics  . Smoking status: Never Smoker   . Smokeless tobacco: Not on file  . Alcohol Use: No   Objective:  Filed Vitals:   01/05/13 1042  BP: 121/77  Pulse: 77  Temp: 98.1 F (36.7 C)   Gen: NAD CV: RRR Resp: Clear bilaterally, no significant wheezing at this time  Assessment/Plan:  Please also see individual problems in problem list for problem-specific plans.

## 2013-02-07 ENCOUNTER — Other Ambulatory Visit: Payer: Self-pay | Admitting: Family Medicine

## 2013-02-07 NOTE — Telephone Encounter (Signed)
Patient is calling because she wasn't sure if the pharmacy would request the Nexium in a 90 day supply with 2 refills which is what she would like.

## 2013-02-16 ENCOUNTER — Other Ambulatory Visit: Payer: Self-pay | Admitting: Family Medicine

## 2013-03-08 ENCOUNTER — Other Ambulatory Visit: Payer: Self-pay | Admitting: Family Medicine

## 2013-03-28 ENCOUNTER — Ambulatory Visit (INDEPENDENT_AMBULATORY_CARE_PROVIDER_SITE_OTHER): Payer: Medicare Other | Admitting: Family Medicine

## 2013-03-28 ENCOUNTER — Encounter: Payer: Self-pay | Admitting: Family Medicine

## 2013-03-28 VITALS — BP 132/84 | HR 75 | Temp 97.7°F | Ht 66.0 in | Wt 247.0 lb

## 2013-03-28 DIAGNOSIS — J069 Acute upper respiratory infection, unspecified: Secondary | ICD-10-CM | POA: Insufficient documentation

## 2013-03-28 HISTORY — DX: Acute upper respiratory infection, unspecified: J06.9

## 2013-03-28 NOTE — Assessment & Plan Note (Signed)
ASSESSMENT:  viral upper respiratory illness.  No wheezing to suggest bronchitis and pneumonia seems unlikely given no fever of shortness of breath.   PLAN: Symptomatic therapy suggested: push fluids, rest and return office visit prn if symptoms persist or worsen. Lack of antibiotic effectiveness discussed with her. Call or return to clinic prn if these symptoms worsen or fail to improve as anticipated

## 2013-03-28 NOTE — Progress Notes (Signed)
Patient ID: Roberta [name], female   DOB: [birth year], 57 y.o.   MRN: [id number] SUBJECTIVE:  Roberta [name] is a 57 y.o. female who complains of congestion, sneezing, nasal blockage, dry cough and sinus pressure for 7 days. She denies a history of chest pain, fevers, nausea, vomiting and wheezing and has a history of asthma. Patient does not smoke cigarettes.  Reports her grandson was recently sick as well.    OBJECTIVE: She appears well, vital signs are as noted.  Ears normal.  Throat and pharynx normal.  Neck supple. No adenopathy in the neck. Nose is congested. Sinuses non tender. The chest is clear, without wheezes or rales.

## 2013-03-28 NOTE — Patient Instructions (Addendum)

## 2013-04-19 HISTORY — PX: ROTATOR CUFF REPAIR: SHX139

## 2013-05-18 ENCOUNTER — Other Ambulatory Visit: Payer: Self-pay | Admitting: Family Medicine

## 2013-05-18 NOTE — Telephone Encounter (Signed)
Requested Prescriptions   Pending Prescriptions Disp Refills  . hydrochlorothiazide (HYDRODIURIL) 25 MG tablet [Pharmacy Med Name: HYDROCHLOROTHIAZIDE 25MG  TABLETS] 90 tablet 0    Sig: TAKE 1 TABLET BY MOUTH DAILY  . hydrochlorothiazide (HYDRODIURIL) 25 MG tablet 90 tablet 3    Sig: Take 1 tablet (25 mg total) by mouth daily.

## 2013-06-15 ENCOUNTER — Encounter: Payer: Self-pay | Admitting: Family Medicine

## 2013-06-15 ENCOUNTER — Ambulatory Visit (INDEPENDENT_AMBULATORY_CARE_PROVIDER_SITE_OTHER): Payer: Medicare Other | Admitting: Family Medicine

## 2013-06-15 VITALS — BP 132/82 | HR 84 | Ht 66.0 in | Wt 248.0 lb

## 2013-06-15 DIAGNOSIS — D649 Anemia, unspecified: Secondary | ICD-10-CM

## 2013-06-15 DIAGNOSIS — E669 Obesity, unspecified: Secondary | ICD-10-CM

## 2013-06-15 DIAGNOSIS — E785 Hyperlipidemia, unspecified: Secondary | ICD-10-CM

## 2013-06-15 DIAGNOSIS — E559 Vitamin D deficiency, unspecified: Secondary | ICD-10-CM

## 2013-06-15 DIAGNOSIS — I10 Essential (primary) hypertension: Secondary | ICD-10-CM

## 2013-06-15 NOTE — Progress Notes (Signed)
  Subjective:    Patient ID: Roberta [name], female    DOB: 1956/11/09, 57 y.o.   MRN: [id number]  HPI:  Roberta [name] comes in for a check up.  She recently had shoulder surgery due to a torn rotator cuff.  This has limited her ability to exercise, but [facility] and [facility] have [facility] classes she can do.  She says she has gained a few lbs back because of the change in activity.  She continues to watch her diet.    HTN: Taking HCTZ and lisinopirl without difficulty.  Denies chest pain, dizziness, palpitations, LE edema.  Patient does not check blood pressures.   HLD: Patient is not taking medications.  Last lipid profile was 1 year ago.  Patient is making lifestyle modifications with diet and exercise.   Hx of Anemia: Feeling fatigued, poor exercise tolerance.  Feels like when she had low iron in the past.  Not taking supplements recently.   Past Medical History  Diagnosis Date  . Allergy   . Anemia   . Asthma   . Depression   . Hyperlipidemia   . Hypertension     History  Substance Use Topics  . Smoking status: Never Smoker   . Smokeless tobacco: Not on file  . Alcohol Use: No    Family History  Problem Relation Age of Onset  . Heart disease Father   . Hyperlipidemia Father   . Hypertension Father      ROS Pertinent items in HPI    Objective:  Physical Exam:  BP 132/82  Pulse 84  Ht 5\' 6"  (1.676 m)  Wt 248 lb (112.492 kg)  BMI 40.05 kg/m2 General appearance: alert, cooperative and no distress Head: Normocephalic, without obvious abnormality, atraumatic Lungs: clear to auscultation bilaterally Heart: regular rate and rhythm, S1, S2 normal, no murmur, click, rub or gallop Pulses: 2+ and symmetric       Assessment & Plan:

## 2013-06-15 NOTE — Patient Instructions (Addendum)
Your blood pressure today was BP: 132/82 mmHg.  Remember your goal blood pressure is about 130/80.  Please be sure to take your medication every day.    Please try to increase your exercise as much as you can.  If you are interested in meeting with Dr. Gerilyn Pilgrim again, please let me know.   To help you work on improving your nutrition, remember the plate rule for each meal:  - 1/4 of the plate or less should be a whole grain starch (brown rice, whole grain pasta, wheat bread, etc.)  - 1/4 of the plate should be a lean source of protein (chicken, [place], fish, beans, egg whites).  - 1/2 the plate or more should be fruits and vegetables - the more vegetables the better!   Please make a lab appointment for first thing in the morning to have your blood drawn.  Do not eat or drink anything but water for 8 hours before your appointment.  You may brush your teeth and take your medications.  I will send you a letter with your lab results, or call you if there are abnormal values.

## 2013-06-15 NOTE — Assessment & Plan Note (Signed)
Overdue for lipids, will check fasting labs.

## 2013-06-15 NOTE — Assessment & Plan Note (Addendum)
Well controlled on current regimen, no changes.  Check BMET.

## 2013-06-15 NOTE — Assessment & Plan Note (Signed)
Wt Readings from Last 3 Encounters:  06/15/13 248 lb (112.492 kg)  03/28/13 247 lb (112.038 kg)  01/05/13 250 lb 9.6 oz (113.671 kg)   Max weight was 263, so overall improvement.  Discussed increasing exercise and nutrition changes.  She declines referral back to Nutritionist today.

## 2013-06-15 NOTE — Assessment & Plan Note (Signed)
Feeling like she might have worsened Anemia- will check CBC.

## 2013-06-18 ENCOUNTER — Other Ambulatory Visit: Payer: Medicare Other

## 2013-06-18 DIAGNOSIS — I10 Essential (primary) hypertension: Secondary | ICD-10-CM

## 2013-06-18 DIAGNOSIS — D649 Anemia, unspecified: Secondary | ICD-10-CM

## 2013-06-18 DIAGNOSIS — E785 Hyperlipidemia, unspecified: Secondary | ICD-10-CM

## 2013-06-18 LAB — BASIC METABOLIC PANEL
CO2: 30 mEq/L (ref 19–32)
Calcium: 9.9 mg/dL (ref 8.4–10.5)
Chloride: 101 mEq/L (ref 96–112)
Glucose, Bld: 97 mg/dL (ref 70–99)
Sodium: 141 mEq/L (ref 135–145)

## 2013-06-18 LAB — CBC
MCV: 78.4 fL (ref 78.0–100.0)
Platelets: 362 10*3/uL (ref 150–400)
RBC: 4.85 MIL/uL (ref 3.87–5.11)
WBC: 5.4 10*3/uL (ref 4.0–10.5)

## 2013-06-18 LAB — LIPID PANEL
LDL Cholesterol: 130 mg/dL — ABNORMAL HIGH (ref 0–99)
Total CHOL/HDL Ratio: 4.7 Ratio

## 2013-06-18 NOTE — Progress Notes (Signed)
BMP,CBC,FLP DONE TODAY Clark Cuff 

## 2013-08-16 ENCOUNTER — Ambulatory Visit: Payer: Medicare Other | Admitting: Family Medicine

## 2013-08-17 ENCOUNTER — Encounter: Payer: Self-pay | Admitting: Family Medicine

## 2013-08-17 ENCOUNTER — Ambulatory Visit (INDEPENDENT_AMBULATORY_CARE_PROVIDER_SITE_OTHER): Payer: Medicare Other | Admitting: Family Medicine

## 2013-08-17 VITALS — BP 125/84 | HR 75 | Temp 98.1°F | Wt 243.0 lb

## 2013-08-17 DIAGNOSIS — I10 Essential (primary) hypertension: Secondary | ICD-10-CM

## 2013-08-17 DIAGNOSIS — H409 Unspecified glaucoma: Secondary | ICD-10-CM | POA: Insufficient documentation

## 2013-08-17 DIAGNOSIS — J45909 Unspecified asthma, uncomplicated: Secondary | ICD-10-CM

## 2013-08-17 DIAGNOSIS — E785 Hyperlipidemia, unspecified: Secondary | ICD-10-CM

## 2013-08-17 DIAGNOSIS — M549 Dorsalgia, unspecified: Secondary | ICD-10-CM

## 2013-08-17 DIAGNOSIS — J309 Allergic rhinitis, unspecified: Secondary | ICD-10-CM

## 2013-08-17 DIAGNOSIS — K219 Gastro-esophageal reflux disease without esophagitis: Secondary | ICD-10-CM

## 2013-08-17 DIAGNOSIS — E669 Obesity, unspecified: Secondary | ICD-10-CM

## 2013-08-17 NOTE — Patient Instructions (Signed)
Dear Roberta [name],   It was great to see you today. Thank you for coming to clinic. Please read below regarding the issues that we discussed.   1. Blood pressure looks great. Keep doing what you are doing.  2. Cholesterol is slightly elevated. i think exercise and the tips below will help. We will recheck in 6 months. Your risk as of today was 6.6% for heart attack or stroke in 10 years but we are hoping to reduce that risk below 5%.  3. Asthma seems well controlled, let us know if you are having trouble with change in seasons, you could always try something like zyrtec to try to make things better but if you use your albuterol more than 2x a week we want to know so we can add another medicine to help.  4. Reflux-glad its doing well.  5. Weight-great job losing 23 lbs over last year or so. I believe you can continue this great progress. Great idea going to the gym.   Please follow up in clinic in 6 months . Please call earlier if you have any questions or concerns.   Sincerely,  Dr. Tana Conch   My 5 to Fitness! These are tips I give to every patient that  are important for living a healthy life!   5: fruits and vegetables per day (work on 9 per day if you are at 5) 4: exercise 4-5 times per week for at least 30 minutes (walking counts!) 3: meals per day (don't skip breakfast!) 2: habits to quit -smoking -excess alcohol use (men >2 beer/day; women >1beer/day) 1: sweet per day (2 cookies, 1 small cup of ice cream, 12 oz soda)  These are general tips for healthy living. Try to start with 1 or 2 habit TODAY and make it a part of your life for several months. Once you have 1 or 2 habits down for several months, try to begin working on your next healthy habit. With every single step you take, you will be leading a healthier lifestyle!   No health maintenance topics applied.

## 2013-08-17 NOTE — Progress Notes (Signed)
  [facility] Tana Conch, MD Phone: [phone]  Subjective:  Yearly physical. Reviewed and updated all history sections.   # Right ankle pain Right ankle turned over when getting out of bed about a week ago. No trouble walking on it. Feels swollen on lateral side.   # GERD Well controlled on Nexium. No substernal burning or chest discomfort.   # Asthma/allergic rhinitis Uses albuterol 1x a month but worse with changes in season. Complains of watery itchy eyes with change in season but none currently. No shortness of breath or wheeze.   #Chronic back pain States has been told she would be a surgical candidate based off pathology found previously (see previous imaging 04/23/2007) but has done well on tramadol and flexeril and she was told to avoid surgery if possible. No urinary incontinence or saddle anesthesia or lower extremity weakness.   # Hyperlipidemia/obesity Not on statin. 10 year risk 6.6% so technically candidate. No early heart disease in family. Patient has lost 23 lbs in last year. Is about to start exercising. Wants to hold off on statin for now.   # Hypertension- BP Readings from Last 3 Encounters:  08/17/13 125/84  06/15/13 132/82  03/28/13 132/84  Home BP monitoring-no Compliant with medications-yes without side effects, lisinopril, hctz, and asa Denies any CP, HA, SOB, blurry vision, LE edema, transient weakness, orthopnea, PND.   ROS--See HPI  Past Medical History Patient Active Problem List   Diagnosis Date Noted  . Gastric polyp 09/09/2011    Priority: Medium  . ALLERGIC RHINITIS 10/14/2010    Priority: Medium  . HYPERLIPIDEMIA 02/16/2007    Priority: Medium  . OBESITY, NOS 02/16/2007    Priority: Medium  . HYPERTENSION, BENIGN SYSTEMIC 02/16/2007    Priority: Medium  . ASTHMA, INTERMITTENT 02/16/2007    Priority: Medium  . Glaucoma     Priority: Low  . GERD (gastroesophageal reflux disease) 09/09/2011    Priority: Low   . DEPRESSIVE DISORDER NOT ELSEWHERE CLASSIFIED 03/25/2009    Priority: Low  . BACK PAIN, CHRONIC 03/25/2009    Priority: Low  . ANEMIA, OTHER, UNSPECIFIED 02/16/2007    Priority: Low  . BREAST MASS, BENIGN 02/04/2010  . OTHER POSTSURGICAL STATUS OTHER 10/16/2009  Reviewed problem list.  Medications- reviewed and updated Chief complaint-noted  Objective: BP 125/84  Pulse 75  Temp(Src) 98.1 F (36.7 C) (Oral)  Wt 243 lb (110.224 kg)  BMI 39.24 kg/m2 Gen: NAD, resting comfortably on table HEENT: Mucous membranes are moist. TM normal.  CV: RRR no murmurs rubs or gallops Lungs: CTAB no crackles, wheeze, rhonchi Skin: warm, dry Neuro: grossly normal, moves all extremities, alert and oriented x4 Ext; no edema MSK: Ankle right ankle Slight swelling over right lateral malleolus Range of motion is full in all directions. Strength is 5/5 in all directions. Stable lateral and medial ligaments; No pain at base of 5th MT; No tenderness over navicular head No tenderness on posterior aspects of lateral and medial malleolus Able to walk 4 steps easily   Assessment/Plan:  Refused flu as states had intense flare of asthma in past after shot  # Right ankle pain Ankle sprain, improving, ice prn.

## 2013-08-17 NOTE — Assessment & Plan Note (Signed)
Well controlled. Continue current medications: tramadol and flexeril prn

## 2013-08-17 NOTE — Assessment & Plan Note (Signed)
Congratulated weight loss. About to start at gym. Praised efforts.

## 2013-08-17 NOTE — Assessment & Plan Note (Signed)
Well controlled. Continue current meds. Nexium.

## 2013-08-17 NOTE — Assessment & Plan Note (Signed)
Well controlled. Continue current meds. Albuterol prn. Advised zyrtec when allergies start bothering her as this seems to exacerbate asthma.

## 2013-08-17 NOTE — Assessment & Plan Note (Signed)
Recheck ldl or fasting lipid panel in 6 months and will recalculate 10 year risk based off this (6.6% today). Hold of on statin for now. Discussed risk benefit of this approach.

## 2013-08-17 NOTE — Assessment & Plan Note (Signed)
Advised prn zyrtec during season changes

## 2013-08-17 NOTE — Assessment & Plan Note (Signed)
Well controlled. Continue current medications: lisinopril, hctz, and asa

## 2013-10-22 ENCOUNTER — Ambulatory Visit (INDEPENDENT_AMBULATORY_CARE_PROVIDER_SITE_OTHER): Payer: Medicare Other | Admitting: Family Medicine

## 2013-10-22 ENCOUNTER — Encounter: Payer: Self-pay | Admitting: Family Medicine

## 2013-10-22 VITALS — BP 135/84 | HR 67 | Temp 98.0°F | Wt 256.0 lb

## 2013-10-22 DIAGNOSIS — M5010 Cervical disc disorder with radiculopathy, unspecified cervical region: Secondary | ICD-10-CM

## 2013-10-22 DIAGNOSIS — Z9889 Other specified postprocedural states: Secondary | ICD-10-CM

## 2013-10-22 DIAGNOSIS — M5412 Radiculopathy, cervical region: Secondary | ICD-10-CM

## 2013-10-22 MED ORDER — GABAPENTIN 100 MG PO CAPS: 100.0000 mg | ORAL_CAPSULE | Freq: Every day | ORAL | Status: DC

## 2013-10-22 NOTE — Patient Instructions (Signed)
For your neck and shoulder pain and tingling coming from a pinched nerve, we are going to try gabapentin for now.  Start with 100mg  nightly then every 4 nights go up 100mg  until you reach 3 pills total (300 mg).   Send me a message in 2 weeks to let me know how you are doing and I will tell you how to increase from there.   If you develop weakness in the arm or shoulder I want to know immediately so we can refer you to the surgeon (Dr. Modesto Charon of neurosurgery?).   Let's check in in person in about a month,  Dr. Durene Cal

## 2013-10-24 DIAGNOSIS — M5010 Cervical disc disorder with radiculopathy, unspecified cervical region: Secondary | ICD-10-CM | POA: Insufficient documentation

## 2013-10-24 NOTE — Assessment & Plan Note (Addendum)
Neck/shoulder pain and paresthesias likely from cervical radiculopathy even after previous surgical intervention. She has no muscle weakness. I asked patient to alert me immediately if this occurs.   Instead of sending to orthopedics for discussion of repeat surgery on the neck (although ultimately this would likely be neurosurgery and patient thinks it was Dr. Regino Schultze who did this previously), patient opted after our discussion to start with gabapentin and titrate up to see if she will have any relief of her symptoms from this. Patient is to send me a message in 2 weeks to update me on how she is doing so I can further titrate this medicine.

## 2013-10-24 NOTE — Progress Notes (Signed)
[name] Roberta Conch, MD Phone: [phone]  Subjective:  Chief complaint-noted  # Neck/shoulder pain and paresthesias Patient has been followed by [facility] after she had a C4-C5, c5-c6, c6-c7 fusion and anterior decompression at least in 2010. She has a history of cervical radiculopathy which improved after her procedure. She states that over the last 4-5 months she has noted increasing left neck, shoulder and upper back pain. She was told by Dr. Renae Fickle that she has 2 pinched nerves and if symptoms worsen she should return to care. She also complains of sensitivity over her left shoulder and will no longer wear a non strapless bar due to irritation.   She also has pruritis over the area without rash. She denies weakness though she states the pain stops her if she holds her left arm over her head to do her hair. She was recently seen by [facility] on 08/27/13 and apparently symptoms were much more infrequent at that time as follow up was scheduled prn as it was reported that her symptoms were much better. Patient tells me since that time she had had worsening of her symptoms. She has a difficult time sleeping at times due to the pain if she turns in a certain way. Patient has tramadol and flexeril to be taken prn but she states she likes to avoid taking them but has had to some lately.   Most recent cervical imaginign was 11/2009 showing "Anterior fusion from C4-C7 with slightly straightened alignment".  From [facility] note-"appropriate positioning of hardware... "successfully fuse"" "Mild increase in her c4 anterior bone spur"  ROS-no fecal or urinary incontinence. No saddle anesthesia. No lower extremity weakness. No fevers/chills. No chest pain or shortness of breath with the above symptoms.   Past Medical History Patient Active Problem List   Diagnosis Date Noted  . Gastric polyp 09/09/2011    Priority: Medium  . OTHER POSTSURGICAL STATUS  OTHER 10/16/2009    Priority: Medium  . BACK PAIN, CHRONIC 03/25/2009    Priority: Medium  . HYPERLIPIDEMIA 02/16/2007    Priority: Medium  . HYPERTENSION, BENIGN SYSTEMIC 02/16/2007    Priority: Medium  . ASTHMA, INTERMITTENT 02/16/2007    Priority: Medium  . Glaucoma     Priority: Low  . GERD (gastroesophageal reflux disease) 09/09/2011    Priority: Low  . ALLERGIC RHINITIS 10/14/2010    Priority: Low  . DEPRESSIVE DISORDER NOT ELSEWHERE CLASSIFIED 03/25/2009    Priority: Low  . OBESITY, NOS 02/16/2007    Priority: Low  . ANEMIA, OTHER, UNSPECIFIED 02/16/2007    Priority: Low    Medications- reviewed and updated Current Outpatient Prescriptions on File Prior to Visit  Medication Sig Dispense Refill  . albuterol (PROAIR HFA) 108 (90 BASE) MCG/ACT inhaler Inhale 2 puffs into the lungs every 4 (four) hours as needed.  2 Inhaler  11  . aspirin 81 MG tablet Take 81 mg by mouth daily.      . cyclobenzaprine (FLEXERIL) 5 MG tablet Take 5 mg by mouth 3 (three) times daily as needed for muscle spasms.      . hydrochlorothiazide (HYDRODIURIL) 25 MG tablet Take 1 tablet (25 mg total) by mouth daily.  90 tablet  3  . lisinopril (PRINIVIL,ZESTRIL) 10 MG tablet Take 1 tablet (10 mg total) by mouth daily.  90 tablet  3  . NEXIUM 40 MG capsule TAKE ONE CAPSULE BY MOUTH DAILY BEFORE BREAKFAST  90 capsule  1  . traMADol (ULTRAM) 50 MG  tablet Take 50 mg by mouth every 6 (six) hours as needed.         No current facility-administered medications on file prior to visit.    Objective: BP 135/84  Pulse 67  Temp(Src) 98 F (36.7 C) (Oral)  Wt 256 lb (116.121 kg) Gen: NAD, moves stiffly and slowly due to pain Neck: unable to perform spurling's as patient with pain and paresthesias with simply tilting neck back. No weakness during this.  CV: RRR no murmurs rubs or gallops.  Lungs: CTAB no crackles, wheeze, rhonchi Neuro: CN II-XII intact, sensation and reflexes normal throughout (with  exception of slightly reduced gross touch to left neck compared to right), 5/5 muscle strength in bilateral upper and lower extremities. Patient did experience some neck/shoulder pain with arms overhead though.  Normal finger to nose. Normal rapid alternating movements.   Assessment/Plan:

## 2013-11-06 ENCOUNTER — Other Ambulatory Visit: Payer: Self-pay | Admitting: Family Medicine

## 2013-11-21 ENCOUNTER — Ambulatory Visit: Payer: Medicare Other | Admitting: Family Medicine

## 2013-11-24 ENCOUNTER — Encounter: Payer: Self-pay | Admitting: Family Medicine

## 2013-12-04 ENCOUNTER — Other Ambulatory Visit: Payer: Self-pay | Admitting: Family Medicine

## 2013-12-06 ENCOUNTER — Ambulatory Visit: Payer: Medicare Other | Admitting: Family Medicine

## 2013-12-10 ENCOUNTER — Ambulatory Visit (INDEPENDENT_AMBULATORY_CARE_PROVIDER_SITE_OTHER): Payer: Medicare Other | Admitting: Family Medicine

## 2013-12-10 ENCOUNTER — Encounter: Payer: Self-pay | Admitting: Family Medicine

## 2013-12-10 VITALS — BP 125/77 | HR 80 | Temp 98.8°F | Wt 254.0 lb

## 2013-12-10 DIAGNOSIS — I10 Essential (primary) hypertension: Secondary | ICD-10-CM

## 2013-12-10 DIAGNOSIS — E669 Obesity, unspecified: Secondary | ICD-10-CM

## 2013-12-10 DIAGNOSIS — E785 Hyperlipidemia, unspecified: Secondary | ICD-10-CM

## 2013-12-10 DIAGNOSIS — M5412 Radiculopathy, cervical region: Secondary | ICD-10-CM

## 2013-12-10 DIAGNOSIS — M5010 Cervical disc disorder with radiculopathy, unspecified cervical region: Secondary | ICD-10-CM

## 2013-12-10 NOTE — Assessment & Plan Note (Signed)
Patient working on lifestyle modifications. Repeat lipids in 3 months.

## 2013-12-10 NOTE — Assessment & Plan Note (Signed)
Over short term, patients goal is to get BMI below 40 and at a very minimum not gain weight over holidays. She is to start back exercising now that neck and shoulder pain are better.

## 2013-12-10 NOTE — Patient Instructions (Signed)
I am glad the back did better with prednisone. Let us know if we can help in anyway.   You lost 2 lbs! Keep up the great work. See me in 2 months. See dash diet handout and tips below.   Blood pressure looks great. We will recheck cholesterol at next visit.   See you in 3 months, Dr. Durene Cal   My 5 to Fitness!  5: fruits and vegetables per day (work on 9 per day if you are at 5) 4: exercise 4-5 times per week for at least 30 minutes (walking counts!) 3: meals per day (don't skip breakfast!) 2: habits to quit -smoking -excess alcohol use (men >2 beer/day; women >1beer/day) 1: sweet per day (2 cookies, 1 small cup of ice cream, 12 oz soda)  These are general tips for healthy living. Try to start with 1 or 2 habit TODAY and make it a part of your life for several months.   You decided to start exercising again since your back pain is better. 2x a week a minimum and up to 5x a week when you see me back  Once you have 1 or 2 habits down for several months, try to begin working on your next healthy habit. With every single step you take, you will be leading a healthier lifestyle!

## 2013-12-10 NOTE — Progress Notes (Signed)
Roberta Conch, MD Phone: [phone]  Subjective:  Chief complaint-noted  57 year old female presents for follow up of obesity, hypertension, hyperlipidemia, and . Patient compliant with liisinopril and HCTZ. Has lost 2 lbs. Workign on moderating food choices and eating more fruits/vegetables. Barriers have been a course of prednisone, back pain requiring prednisone and the holidays. Despite these barriers, congratulated patient on weight loss. We also discussed her chronic shoulder/neck pain which required steroids. She was concerned about side effects of gabapentin so did not take this medicine.  ROS- back pain much improved. No weakness in extremities. No chest pain or shortness of breath or LE edema.   Past Medical History Patient Active Problem List   Diagnosis Date Noted  . Cervical disc disorder with radiculopathy of cervical region 10/24/2013    Priority: Medium  . Gastric polyp 09/09/2011    Priority: Medium  . OTHER POSTSURGICAL STATUS OTHER 10/16/2009    Priority: Medium  . BACK PAIN, CHRONIC 03/25/2009    Priority: Medium  . HYPERLIPIDEMIA 02/16/2007    Priority: Medium  . HYPERTENSION, BENIGN SYSTEMIC 02/16/2007    Priority: Medium  . ASTHMA, INTERMITTENT 02/16/2007    Priority: Medium  . Glaucoma     Priority: Low  . GERD (gastroesophageal reflux disease) 09/09/2011    Priority: Low  . ALLERGIC RHINITIS 10/14/2010    Priority: Low  . DEPRESSIVE DISORDER NOT ELSEWHERE CLASSIFIED 03/25/2009    Priority: Low  . OBESITY, NOS 02/16/2007    Priority: Low  . ANEMIA, OTHER, UNSPECIFIED 02/16/2007    Priority: Low  . BREAST MASS, BENIGN 02/04/2010    Medications- reviewed and updated Current Outpatient Prescriptions  Medication Sig Dispense Refill  . albuterol (PROAIR HFA) 108 (90 BASE) MCG/ACT inhaler Inhale 2 puffs into the lungs every 4 (four) hours as needed.  2 Inhaler  11  . aspirin 81 MG tablet Take 81 mg by mouth daily.      . cyclobenzaprine (FLEXERIL)  5 MG tablet Take 5 mg by mouth 3 (three) times daily as needed for muscle spasms.      Marland Kitchen gabapentin (NEURONTIN) 100 MG capsule Take 1 capsule (100 mg total) by mouth at bedtime.  90 capsule  3  . hydrochlorothiazide (HYDRODIURIL) 25 MG tablet Take 1 tablet (25 mg total) by mouth daily.  90 tablet  3  . lisinopril (PRINIVIL,ZESTRIL) 10 MG tablet TAKE 1 TABLET BY MOUTH EVERY DAY  90 tablet  3  . NEXIUM 40 MG capsule TAKE 1 CAPSULE BY MOUTH EVERY MORNING BEFORE BREAKFAST  90 capsule  3  . traMADol (ULTRAM) 50 MG tablet Take 50 mg by mouth every 6 (six) hours as needed.         No current facility-administered medications for this visit.    Objective: BP 125/77  Pulse 80  Temp(Src) 98.8 F (37.1 C) (Oral)  Wt 254 lb (115.214 kg) Gen: NAD, resting comfortably CV: RRR no murmurs rubs or gallops Lungs: CTAB no crackles, wheeze, rhonchi Abdomen: soft/nontender/nondistended/normal bowel sounds. No rebound or guarding.  Ext: no edema  Assessment/Plan:  HYPERTENSION, BENIGN SYSTEMIC Well controlled. Continue lisinopril 10mg  and HCTZ 25mg . Discussed DASH diet and weight loss.   HYPERLIPIDEMIA Patient working on lifestyle modifications. Repeat lipids in 3 months.   Cervical disc disorder with radiculopathy of cervical region Patient went back to orthopedics and had much improvement from one course of prednisone. Encouraged patient to f/u if any worsening of symptoms. Patient has tramadol and flexeril on hand to use as  needed.   OBESITY, NOS Over short term, patients goal is to get BMI below 40 and at a very minimum not gain weight over holidays. She is to start back exercising now that neck and shoulder pain are better.

## 2013-12-10 NOTE — Assessment & Plan Note (Addendum)
Patient went back to orthopedics and had much improvement from one course of prednisone. Encouraged patient to f/u if any worsening of symptoms. Patient has tramadol and flexeril on hand to use as needed.

## 2013-12-10 NOTE — Assessment & Plan Note (Signed)
Well controlled. Continue lisinopril 10mg  and HCTZ 25mg . Discussed DASH diet and weight loss.

## 2014-01-04 ENCOUNTER — Encounter: Payer: Self-pay | Admitting: Family Medicine

## 2014-01-04 DIAGNOSIS — D259 Leiomyoma of uterus, unspecified: Secondary | ICD-10-CM | POA: Insufficient documentation

## 2014-01-21 ENCOUNTER — Telehealth: Payer: Self-pay | Admitting: Family Medicine

## 2014-01-21 DIAGNOSIS — M5010 Cervical disc disorder with radiculopathy, unspecified cervical region: Secondary | ICD-10-CM

## 2014-01-21 NOTE — Telephone Encounter (Signed)
Would like referral to [facility]. She has an appt on Fb 19 for a shot in her back She needs referral before app

## 2014-01-21 NOTE — Telephone Encounter (Signed)
Referral placed. Please inform [facility] as well as patient.

## 2014-01-28 NOTE — Telephone Encounter (Signed)
Pt has an appt with [facility] and pt is aware.  [place]

## 2014-03-19 ENCOUNTER — Encounter: Payer: Self-pay | Admitting: Family Medicine

## 2014-03-19 ENCOUNTER — Ambulatory Visit (INDEPENDENT_AMBULATORY_CARE_PROVIDER_SITE_OTHER): Payer: Medicare Other | Admitting: Family Medicine

## 2014-03-19 VITALS — BP 117/78 | HR 75 | Temp 98.5°F | Wt 258.0 lb

## 2014-03-19 DIAGNOSIS — M549 Dorsalgia, unspecified: Secondary | ICD-10-CM

## 2014-03-19 DIAGNOSIS — E669 Obesity, unspecified: Secondary | ICD-10-CM

## 2014-03-19 DIAGNOSIS — F329 Major depressive disorder, single episode, unspecified: Secondary | ICD-10-CM

## 2014-03-19 DIAGNOSIS — F3289 Other specified depressive episodes: Secondary | ICD-10-CM

## 2014-03-19 DIAGNOSIS — E785 Hyperlipidemia, unspecified: Secondary | ICD-10-CM

## 2014-03-19 NOTE — Patient Instructions (Signed)
1. Weight loss- exercising every other day for at least 30 minutes,, work with son who does line dancing and exercise regimen, continue to eat better, packing a healthy snack bag for long trips and long stays with your daughter. Ultimate goal to get to less than 240 lbs which would be reasonable over 3-4 months.   2. Back Pain- continue to work with orthopedics. Weight loss should help.   3. Labs-we will check your iron and blood counts in about 3 months.   4. Depression. i want you to come back and see me in 3 weeks to see how things are doing. Keep working with Higher education careers adviser. Let me know if anything changes (worsening) and absolutely call us if you have any thoughts of hurting yourself or others or go to the ER.  Look forward to seeing you in 3 weeks,  Dr. Yong Channel

## 2014-03-22 NOTE — Progress Notes (Signed)
Roberta Reddish, MD Phone: [phone]  Subjective:  Chief complaint-noted  Roberta [name] is a 58 y.o. year old very pleasant female patient who presents with the following:  Back Pain States significant improvement recently after epidural steroid injections. Continues to follow with orthopedics ROS- no recent neck pain, no fecal or urinary incontinence, no lower extremity weakness or paresthesias.   Hyperlipidemia/Obesity Not on statin as wanted to make lifestyle changes but weight increased. Knows obesity contributes to back pain. Recently traveling a lot and eating poorly. Hadn't been exercising due to back pain (now improved). Plans to start exercising again and has already started some food changes.  ROS-no unintentional weight gain/no changes in hair or nails, no hot or cold intolerance. No chest pain or shortness of breath.   Depression PHq9 of 7 today. States she is seeing a Social worker Dr. Tobie Poet. She has been working on crafts lately. States she is improving but had a dark period recently after loss of some friends ROS- No SI/HI.   Past Medical History- Patient Active Problem List   Diagnosis Date Noted  . Cervical disc disorder with radiculopathy of cervical region 10/24/2013    Priority: Medium  . Gastric polyp 09/09/2011    Priority: Medium  . OTHER POSTSURGICAL STATUS OTHER 10/16/2009    Priority: Medium  . BACK PAIN, CHRONIC 03/25/2009    Priority: Medium  . HYPERLIPIDEMIA 02/16/2007    Priority: Medium  . HYPERTENSION, BENIGN SYSTEMIC 02/16/2007    Priority: Medium  . ASTHMA, INTERMITTENT 02/16/2007    Priority: Medium  . Glaucoma     Priority: Low  . GERD (gastroesophageal reflux disease) 09/09/2011    Priority: Low  . ALLERGIC RHINITIS 10/14/2010    Priority: Low  . DEPRESSIVE DISORDER NOT ELSEWHERE CLASSIFIED 03/25/2009    Priority: Low  . OBESITY, NOS 02/16/2007    Priority: Low  . ANEMIA, OTHER, UNSPECIFIED 02/16/2007    Priority: Low  . Uterine  fibroid 01/04/2014  . BREAST MASS, BENIGN 02/04/2010   Medications- reviewed and updated Current Outpatient Prescriptions  Medication Sig Dispense Refill  . albuterol (PROAIR HFA) 108 (90 BASE) MCG/ACT inhaler Inhale 2 puffs into the lungs every 4 (four) hours as needed.  2 Inhaler  11  . aspirin 81 MG tablet Take 81 mg by mouth daily.      . cyclobenzaprine (FLEXERIL) 5 MG tablet Take 5 mg by mouth 3 (three) times daily as needed for muscle spasms.      Marland Kitchen gabapentin (NEURONTIN) 100 MG capsule Take 1 capsule (100 mg total) by mouth at bedtime.  90 capsule  3  . hydrochlorothiazide (HYDRODIURIL) 25 MG tablet Take 1 tablet (25 mg total) by mouth daily.  90 tablet  3  . lisinopril (PRINIVIL,ZESTRIL) 10 MG tablet TAKE 1 TABLET BY MOUTH EVERY DAY  90 tablet  3  . NEXIUM 40 MG capsule TAKE 1 CAPSULE BY MOUTH EVERY MORNING BEFORE BREAKFAST  90 capsule  3  . traMADol (ULTRAM) 50 MG tablet Take 50 mg by mouth every 6 (six) hours as needed.         No current facility-administered medications for this visit.    Objective: BP 117/78  Pulse 75  Temp(Src) 98.5 F (36.9 C) (Oral)  Wt 258 lb (117.028 kg) Gen: NAD, resting comfortably in chair, obese CV: RRR no murmurs rubs or gallops Lungs: CTAB no crackles, wheeze, rhonchi Ext: no edema Neuro: 5/5 strength lower extremities, intact sensation  Assessment/Plan:  BACK PAIN, CHRONIC No refills needed  at this time. Using tramadol and flexeril sparingly after epidural steroid injections. COntinues to follow with orthopedics. Knows weight loss would help.   OBESITY, NOS See avs for goals.   HYPERLIPIDEMIA Wants a few more months before checking lipids again. Plan at next visit will be to check lipids for HLD as well as CBC, iron, ferritin, TIBC for history of anemia. 3 months.   DEPRESSIVE DISORDER NOT ELSEWHERE CLASSIFIED Mild depression. ALready with treatment through counselor. Will follow up in 3 weeks to make sure improving or stable. PHq9  7 at this visit

## 2014-03-22 NOTE — Assessment & Plan Note (Signed)
Wants a few more months before checking lipids again. Plan at next visit will be to check lipids for HLD as well as CBC, iron, ferritin, TIBC for history of anemia. 3 months.

## 2014-03-22 NOTE — Assessment & Plan Note (Signed)
No refills needed at this time. Using tramadol and flexeril sparingly after epidural steroid injections. COntinues to follow with orthopedics. Knows weight loss would help.

## 2014-03-22 NOTE — Assessment & Plan Note (Signed)
See avs for goals.

## 2014-03-22 NOTE — Assessment & Plan Note (Addendum)
Mild depression. ALready with treatment through counselor. Will follow up in 3 weeks to make sure improving or stable. PHq9 7 at this visit

## 2014-04-01 ENCOUNTER — Encounter: Payer: Self-pay | Admitting: Family Medicine

## 2014-04-08 ENCOUNTER — Encounter: Payer: Self-pay | Admitting: Family Medicine

## 2014-04-08 ENCOUNTER — Encounter: Payer: Self-pay | Admitting: Home Health Services

## 2014-04-08 ENCOUNTER — Ambulatory Visit: Payer: Medicare Other | Admitting: Family Medicine

## 2014-04-08 ENCOUNTER — Ambulatory Visit (INDEPENDENT_AMBULATORY_CARE_PROVIDER_SITE_OTHER): Payer: Medicare Other | Admitting: Family Medicine

## 2014-04-08 ENCOUNTER — Encounter: Payer: Medicare Other | Admitting: Home Health Services

## 2014-04-08 VITALS — BP 124/86 | HR 75 | Temp 98.2°F | Ht 66.0 in | Wt 250.4 lb

## 2014-04-08 DIAGNOSIS — Z0000 Encounter for general adult medical examination without abnormal findings: Secondary | ICD-10-CM

## 2014-04-08 DIAGNOSIS — J309 Allergic rhinitis, unspecified: Secondary | ICD-10-CM

## 2014-04-08 DIAGNOSIS — F329 Major depressive disorder, single episode, unspecified: Secondary | ICD-10-CM

## 2014-04-08 DIAGNOSIS — J45909 Unspecified asthma, uncomplicated: Secondary | ICD-10-CM

## 2014-04-08 DIAGNOSIS — F3289 Other specified depressive episodes: Secondary | ICD-10-CM

## 2014-04-08 NOTE — Progress Notes (Signed)
Garret Reddish, MD Phone: [phone]  Subjective:   Roberta [name] is a 58 y.o. year old very pleasant female patient who presents for acute issue follow up after a medicare wellness exam with the following:  Asthma Seasonal allergies Much worse with change in season. Watery itchy eyes, sneezing, coughing. Did not think she could take zyrtec daily. At present waking up at least 2x a week with coughing spells and some wheezing. In daytime, uses albuterol about 1x a week. In difficult position because needs to vacuum carpets but that worsens her breathing issues when doing it but if she doesn't, pollen and other irritants seem to make her have more chronic issues. When she has to vacuum, she also has worsening pain in neck and back as noted below ROS- occasional wheeze, no fever/chills. Some shortness of breath relieved by albuterol.   Depression Sleep Issues patient has been very active going to [facility] 3-5 days a week and working out an hour. Thrilled by weight loss and activity has improved depression symptoms. Still meeting with her counselor. PHQ9 today 5 down from 7 when I saw her last. Patient states vacuuming from above does seem to worsen pain significantly and this has made it harder for her to sleep at night. Pain causes her to only sleep 4-5 hours. She states flexeril helps but was worried about too frequent use ROS- no SI/HI.  Past Medical History- Patient Active Problem List   Diagnosis Date Noted  . Cervical disc disorder with radiculopathy of cervical region 10/24/2013    Priority: Medium  . Gastric polyp 09/09/2011    Priority: Medium  . OTHER POSTSURGICAL STATUS OTHER 10/16/2009    Priority: Medium  . BACK PAIN, CHRONIC 03/25/2009    Priority: Medium  . HYPERLIPIDEMIA 02/16/2007    Priority: Medium  . HYPERTENSION, BENIGN SYSTEMIC 02/16/2007    Priority: Medium  . ASTHMA, INTERMITTENT 02/16/2007    Priority: Medium  . Glaucoma     Priority: Low  . GERD  (gastroesophageal reflux disease) 09/09/2011    Priority: Low  . ALLERGIC RHINITIS 10/14/2010    Priority: Low  . DEPRESSIVE DISORDER NOT ELSEWHERE CLASSIFIED 03/25/2009    Priority: Low  . OBESITY, NOS 02/16/2007    Priority: Low  . ANEMIA, OTHER, UNSPECIFIED 02/16/2007    Priority: Low  . Uterine fibroid 01/04/2014  . BREAST MASS, BENIGN 02/04/2010   Medications- reviewed and updated Current Outpatient Prescriptions  Medication Sig Dispense Refill  . albuterol (PROAIR HFA) 108 (90 BASE) MCG/ACT inhaler Inhale 2 puffs into the lungs every 4 (four) hours as needed.  2 Inhaler  11  . aspirin 81 MG tablet Take 81 mg by mouth daily.      . Cholecalciferol (VITAMIN D-3) 1000 UNITS CAPS Take 3 capsules by mouth daily.      . cyclobenzaprine (FLEXERIL) 5 MG tablet Take 5 mg by mouth 3 (three) times daily as needed for muscle spasms.      Marland Kitchen gabapentin (NEURONTIN) 100 MG capsule Take 1 capsule (100 mg total) by mouth at bedtime.  90 capsule  3  . hydrochlorothiazide (HYDRODIURIL) 25 MG tablet Take 1 tablet (25 mg total) by mouth daily.  90 tablet  3  . lisinopril (PRINIVIL,ZESTRIL) 10 MG tablet TAKE 1 TABLET BY MOUTH EVERY DAY  90 tablet  3  . NEXIUM 40 MG capsule TAKE 1 CAPSULE BY MOUTH EVERY MORNING BEFORE BREAKFAST  90 capsule  3  . traMADol (ULTRAM) 50 MG tablet Take 50 mg by  mouth every 6 (six) hours as needed.         No current facility-administered medications for this visit.    Objective: BP 124/86  Pulse 75  Temp(Src) 98.2 F (36.8 C)  Wt 250 lb 6.4 oz (113.581 kg) Gen: NAD, resting comfortably on table, does appear to be in pain with some movements HEENT: bluish tint to nares, mild rhinorrhea CV: RRR no murmurs rubs or gallops Lungs: CTAB no crackles, wheeze, rhonchi Ext: no edema  Assessment/Plan:  ALLERGIC RHINITIS Poorly controlled, advised could use zyrtec daily during this change in season.   ASTHMA, INTERMITTENT Poorly controlled with season change and need to  vacuum so much. Wrote letter for patient to have carpets removed in her apartment. Hopeful this will help [place] avoid need for step up therapy though this could be considered if no improvement in 1 month or if worsening.   DEPRESSIVE DISORDER NOT ELSEWHERE CLASSIFIED Mild depression. Improving by phq9 to 5 from 7 and per patient report. No SI/HI. Continue use of counselor and exercise. Follow up in 1 month. Sleep issues seem to be mainly related to pain (neuropathic pain in neck) but could consider medication such as nortriptyline to help both issues.

## 2014-04-08 NOTE — Assessment & Plan Note (Signed)
Poorly controlled, advised could use zyrtec daily during this change in season.

## 2014-04-08 NOTE — Assessment & Plan Note (Signed)
Poorly controlled with season change and need to vacuum so much. Wrote letter for patient to have carpets removed in her apartment. Hopeful this will help [place] avoid need for step up therapy though this could be considered if no improvement in 1 month or if worsening.

## 2014-04-08 NOTE — Assessment & Plan Note (Addendum)
Mild depression. Improving by phq9 to 5 from 7 and per patient report. No SI/HI. Continue use of counselor and exercise. Follow up in 1 month. Sleep issues seem to be mainly related to pain (neuropathic pain in neck) but could consider medication such as nortriptyline to help both issues. Advised flexeril 1/2 tab at night to see if that helps with pain issues.

## 2014-04-08 NOTE — Patient Instructions (Signed)
Asthma- your symptoms are poorly controlled right now. I would advise you to take cetirizine everyday for at least the next month. Also, if you can have the carpets removed this would help (see letter). I want to see you in about a month to check in on her symptoms.   Sleep issues-I would try a 1/2 of a flexeril at night to see if that helps you sleep better since pain is the main issue.  Depression-i want you to come back and see me in 4 weeks to see how things are doing. Keep working with Higher education careers adviser. I am glad things are doing better! Absolutely call us if you have any thoughts of hurting yourself or others or go to the ER. We could consider nortriptyline to help both depression and sleep in 4 weeks.   Thanks, Dr. Yong Channel

## 2014-04-12 ENCOUNTER — Encounter: Payer: Self-pay | Admitting: Family Medicine

## 2014-04-12 NOTE — Progress Notes (Signed)
Open in error

## 2014-04-12 NOTE — Addendum Note (Signed)
Addended by: Lorenza Cambridge on: 04/12/2014 10:32 AM   Modules accepted: Level of Service

## 2014-04-12 NOTE — Progress Notes (Addendum)
Patient here for annual wellness visit, patient reports: Risk Factors/Conditions needing evaluation or treatment: Pt had some concerns with sleeping, Pt was able to see PCP same day as Wellness Visit.  See H&P below. Home Safety: Pt lives by self in 1 story home.  Pt reports having smoke detectors.  Other Information: Corrective lens: Pt wears daily corrective lens.  Has annual eye exams. Dentures: PT has both upper and lower dentures.  Does not have regular dental exams.  Memory: Pt denies any memory problems. Patient's Mini Mental Score (recorded in doc. flowsheet): 29 ADL/IADL: Pt reports independence in all functions. Bladder: Pt denies any bladder problems. BMI/Exercise: We discussed BMI and strategies for weight loss including portion sizes and starting a regular exercise routine.  PT reports exercising about 1-2 hours a week swimming, biking or using treadmill. Med Adherence: We discussed importance of taking all medications as prescribed for cholesterol and htn.  Pt reports missing meds 0 times in the past 7 days.  Balance/Gait: Pt reports no falls in the past 12 months.  We discussed home safety and fall prevention.     Annual Wellness Visit Requirements Recorded Today In  Medical, family, social history Past Medical, Family, Social History Section  Current providers Care team  Current medications Medications  Wt, BP, Ht, BMI Vital signs  Hearing assessment (welcome visit) Hearing/vision  Tobacco, alcohol, illicit drug use History  ADL Nurse Assessment  Depression Screening Nurse Assessment  Cognitive impairment Nurse Assessment  Mini Mental Status Document Flowsheet  Fall Risk Fall/Depression  Home Safety Progress Note  End of Life Planning (welcome visit) Social Documentation  Medicare preventative services Progress Note  Risk factors/conditions needing evaluation/treatment Progress Note  Personalized health advice Patient Instructions, goals, letter  Diet & Exercise  Social Documentation  Emergency Contact Social Documentation  Seat Belts Social Documentation  Sun exposure/protection Social Documentation

## 2014-04-24 ENCOUNTER — Encounter: Payer: Self-pay | Admitting: Family Medicine

## 2014-04-24 DIAGNOSIS — Z0000 Encounter for general adult medical examination without abnormal findings: Secondary | ICD-10-CM | POA: Insufficient documentation

## 2014-04-24 LAB — HIV 1/2 CONFIRMATION
HIV 1 Ab: NONREACTIVE
HIV 2 Ab: NONREACTIVE
Hepatitis B Surface Antigen: NEGATIVE
Hepatitis C Ab: NEGATIVE

## 2014-05-08 ENCOUNTER — Ambulatory Visit (INDEPENDENT_AMBULATORY_CARE_PROVIDER_SITE_OTHER): Payer: Medicare Other | Admitting: Family Medicine

## 2014-05-08 ENCOUNTER — Encounter: Payer: Self-pay | Admitting: Family Medicine

## 2014-05-08 VITALS — BP 126/88 | HR 86 | Temp 98.1°F | Ht 66.0 in | Wt 253.0 lb

## 2014-05-08 DIAGNOSIS — F3289 Other specified depressive episodes: Secondary | ICD-10-CM

## 2014-05-08 DIAGNOSIS — J45909 Unspecified asthma, uncomplicated: Secondary | ICD-10-CM

## 2014-05-08 DIAGNOSIS — F329 Major depressive disorder, single episode, unspecified: Secondary | ICD-10-CM

## 2014-05-08 DIAGNOSIS — J309 Allergic rhinitis, unspecified: Secondary | ICD-10-CM

## 2014-05-08 MED ORDER — HYDROCHLOROTHIAZIDE 25 MG PO TABS: 25.0000 mg | ORAL_TABLET | Freq: Every day | ORAL | Status: DC

## 2014-05-08 NOTE — Patient Instructions (Signed)
Healthy lifestyle choices  [employer] job getting motivated for food changes and exercise!  Labs  Will draw in 2 months to give you more time to work on lowering your cholesterol (will also check in on anemia at that time)  Depression  Glad this is so much better!  Your food changes and exercise will help  Call if you need anything or if symptoms worsen  Glad sleep is doing better with flexeril  Allergies/Asthma  Much better on zyrtec  Keep cleaning as you are doing (and getting family to help with the carpets)  Thanks for letting me be your doctor and I know you will hit it off with your new doctor in 2 months, Dr. Yong Channel

## 2014-05-08 NOTE — Progress Notes (Signed)
Garret Reddish, MD Phone: [phone]  Subjective:   Roberta [name] is a 58 y.o. year old very pleasant female patient who presents for acute issue follow up after a medicare wellness exam with the following:  Asthma Seasonal allergies Itchy eyes, sneezing better with zyrtec daily. Still with coughing but coughing/wheezing much improved. Not waking up at night anymore, use albuterol once a month. Only troublesome time are after people come in and out of the house and bring pollen in. She has difficulty vacuuming due to her back pain and even more severe coughing so symptoms are worse after these periods. She can sometimes get family to come in to vacuum for her.  ROS- occasional wheeze, no fever/chills. Some shortness of breath relieved by albuterol (once a month or so now)  Depression Sleep Issues PHQ 9 is a 3 today down from peak of 7. Eating healthy/exercise has really helped. Not meeting with her counselor as frequently as a result of how great she is feeling 2/3 points are from sleep issues due to pain. Flexeril has helped this. She sleeps through the night about 50% of days now.  ROS- no SI/HI.  Past Medical History- Patient Active Problem List   Diagnosis Date Noted  . Cervical disc disorder with radiculopathy of cervical region 10/24/2013    Priority: Medium  . Gastric polyp 09/09/2011    Priority: Medium  . OTHER POSTSURGICAL STATUS OTHER 10/16/2009    Priority: Medium  . BACK PAIN, CHRONIC 03/25/2009    Priority: Medium  . HYPERLIPIDEMIA 02/16/2007    Priority: Medium  . HYPERTENSION, BENIGN SYSTEMIC 02/16/2007    Priority: Medium  . ASTHMA, INTERMITTENT 02/16/2007    Priority: Medium  . Uterine fibroid 01/04/2014    Priority: Low  . Glaucoma     Priority: Low  . GERD (gastroesophageal reflux disease) 09/09/2011    Priority: Low  . ALLERGIC RHINITIS 10/14/2010    Priority: Low  . BREAST MASS, BENIGN 02/04/2010    Priority: Low  . DEPRESSIVE DISORDER NOT  ELSEWHERE CLASSIFIED 03/25/2009    Priority: Low  . OBESITY, NOS 02/16/2007    Priority: Low  . ANEMIA, OTHER, UNSPECIFIED 02/16/2007    Priority: Low   Medications- reviewed and updated Current Outpatient Prescriptions  Medication Sig Dispense Refill  . aspirin 81 MG tablet Take 81 mg by mouth daily.      . Cholecalciferol (VITAMIN D-3) 1000 UNITS CAPS Take 3 capsules by mouth daily.      . cyclobenzaprine (FLEXERIL) 5 MG tablet Take 5 mg by mouth 3 (three) times daily as needed for muscle spasms.      . hydrochlorothiazide (HYDRODIURIL) 25 MG tablet Take 1 tablet (25 mg total) by mouth daily.  90 tablet  3  . lisinopril (PRINIVIL,ZESTRIL) 10 MG tablet TAKE 1 TABLET BY MOUTH EVERY DAY  90 tablet  3  . NEXIUM 40 MG capsule TAKE 1 CAPSULE BY MOUTH EVERY MORNING BEFORE BREAKFAST  90 capsule  3  . traMADol (ULTRAM) 50 MG tablet Take 50 mg by mouth every 6 (six) hours as needed.        Marland Kitchen albuterol (PROAIR HFA) 108 (90 BASE) MCG/ACT inhaler Inhale 2 puffs into the lungs every 4 (four) hours as needed.  2 Inhaler  11   No current facility-administered medications for this visit.    Objective: BP 126/88  Pulse 86  Temp(Src) 98.1 F (36.7 C) (Oral)  Ht 5\' 6"  (1.676 m)  Wt 253 lb (114.76 kg)  BMI  40.85 kg/m2 Gen: NAD, resting comfortably on table HEENT: no rhinorrhea CV: RRR no murmurs rubs or gallops Lungs: CTAB no crackles, wheeze, rhonchi Ext: no edema  Assessment/Plan:  ASTHMA, INTERMITTENT Much improved on zyrtec as seasonal allergies seemed to be trigger for asthma. I still think she would benefit from not having carpets as most difficult times are after pollen is tracked into apartment by visitors. Down to once monthly albuterol primarily.   ALLERGIC RHINITIS Much improved on zyrtec, continue daily use during seasons that are more troublesome.   DEPRESSIVE DISORDER NOT ELSEWHERE CLASSIFIED Improved to phq9 of 3 with sleep issues being main issue due to pain. I would  continue to follow up with phq9s at subsequent visits. Flexeril helping with sleep. Could consider nortriptyline if sleep issues or depression worsen in future.

## 2014-05-08 NOTE — Assessment & Plan Note (Signed)
Much improved on zyrtec, continue daily use during seasons that are more troublesome.

## 2014-05-08 NOTE — Assessment & Plan Note (Signed)
Improved to phq9 of 3 with sleep issues being main issue due to pain. I would continue to follow up with phq9s at subsequent visits. Flexeril helping with sleep. Could consider nortriptyline if sleep issues or depression worsen in future.

## 2014-05-08 NOTE — Assessment & Plan Note (Signed)
Much improved on zyrtec as seasonal allergies seemed to be trigger for asthma. I still think she would benefit from not having carpets as most difficult times are after pollen is tracked into apartment by visitors. Down to once monthly albuterol primarily.

## 2014-05-09 ENCOUNTER — Encounter: Payer: Self-pay | Admitting: Family Medicine

## 2014-07-11 ENCOUNTER — Telehealth: Payer: Self-pay | Admitting: Family Medicine

## 2014-07-11 DIAGNOSIS — M5010 Cervical disc disorder with radiculopathy, unspecified cervical region: Secondary | ICD-10-CM

## 2014-07-11 NOTE — Telephone Encounter (Signed)
Need referral to [facility] to see Dr. Eddie Dibbles.  Please inform when referral has been processed and appt scheduled.

## 2014-07-31 ENCOUNTER — Telehealth: Payer: Self-pay | Admitting: *Deleted

## 2014-07-31 NOTE — Telephone Encounter (Signed)
[facility] called to request NPI number.  Pt has an appt for follow up for neck. NPI number given x 2 visits.  Roberta Barrow, RN

## 2014-08-11 ENCOUNTER — Other Ambulatory Visit: Payer: Self-pay | Admitting: Family Medicine

## 2014-08-14 ENCOUNTER — Encounter: Payer: Self-pay | Admitting: Family Medicine

## 2014-08-14 ENCOUNTER — Ambulatory Visit (INDEPENDENT_AMBULATORY_CARE_PROVIDER_SITE_OTHER): Payer: Medicare Other | Admitting: Family Medicine

## 2014-08-14 VITALS — BP 137/83 | HR 97 | Temp 98.2°F | Ht 66.0 in | Wt 252.2 lb

## 2014-08-14 DIAGNOSIS — J309 Allergic rhinitis, unspecified: Secondary | ICD-10-CM

## 2014-08-14 DIAGNOSIS — Z889 Allergy status to unspecified drugs, medicaments and biological substances status: Secondary | ICD-10-CM

## 2014-08-14 DIAGNOSIS — Z9109 Other allergy status, other than to drugs and biological substances: Secondary | ICD-10-CM

## 2014-08-14 NOTE — Patient Instructions (Signed)
Thank you for coming to see me today. It was a pleasure. Today we talked about:   Allergies: please take your Allegra. On your exam, I did not see anything that made me think something more was happening.   Please make an appointment to see Dr. Skeet Simmer for a follow-up when applicable  If you have any questions or concerns, please do not hesitate to call the office at [phone].  Sincerely,  Cordelia Poche, MD

## 2014-08-14 NOTE — Assessment & Plan Note (Signed)
Advised patient to take Allegra. Patient does not want nasal steroids or Singulair as she has a bad experience while she was on those medications, although I do not think the medications are the cause. Discussed systemic steroids and patient states she maximum she can take is prednisone 5mg . Do not suspect this will be needed at this time, however.

## 2014-08-14 NOTE — Progress Notes (Signed)
    Subjective   Roberta [name] is a 58 y.o. female that presents for a same day visit  1. Cough: Throat aching, head hurting, pain with swallowing. Has not taken any Allegra. Exposure to daughter who has pets at home, including a dog. Does not take nasal steroids due to previous development of pneumonia. Shortness of breath earlier today requiring albuterol which has improved. No chest pain. No runny nose or sneezing. No fever.  History  Substance Use Topics  . Smoking status: Never Smoker   . Smokeless tobacco: Not on file  . Alcohol Use: No    ROS Per HPI  Objective   BP 137/83  Pulse 97  Temp(Src) 98.2 F (36.8 C) (Oral)  Ht 5\' 6"  (1.676 m)  Wt 252 lb 3.2 oz (114.397 kg)  BMI 40.73 kg/m2  General: well appearing and in no acute distress HEENT: oropharynx moist and clear. No frontal or maxillary sinus tenderness bilaterally. Respiratory/chest: Clear to auscultation without wheezing or rales  Assessment and Plan   Please refer to problem based charting of assessment and plan

## 2014-08-21 ENCOUNTER — Ambulatory Visit: Payer: Medicare Other | Attending: Orthopedic Surgery

## 2014-08-21 DIAGNOSIS — M542 Cervicalgia: Secondary | ICD-10-CM | POA: Diagnosis not present

## 2014-08-21 DIAGNOSIS — M6281 Muscle weakness (generalized): Secondary | ICD-10-CM | POA: Diagnosis not present

## 2014-08-21 DIAGNOSIS — R293 Abnormal posture: Secondary | ICD-10-CM | POA: Diagnosis not present

## 2014-08-21 DIAGNOSIS — IMO0001 Reserved for inherently not codable concepts without codable children: Secondary | ICD-10-CM | POA: Diagnosis not present

## 2014-09-03 ENCOUNTER — Ambulatory Visit: Payer: Medicare Other | Admitting: Physical Therapy

## 2014-09-03 ENCOUNTER — Encounter: Payer: Self-pay | Admitting: Family Medicine

## 2014-09-03 ENCOUNTER — Ambulatory Visit (INDEPENDENT_AMBULATORY_CARE_PROVIDER_SITE_OTHER): Payer: Medicare Other | Admitting: Family Medicine

## 2014-09-03 VITALS — BP 113/73 | HR 78 | Temp 98.2°F | Ht 66.0 in | Wt 258.0 lb

## 2014-09-03 DIAGNOSIS — I10 Essential (primary) hypertension: Secondary | ICD-10-CM

## 2014-09-03 DIAGNOSIS — F329 Major depressive disorder, single episode, unspecified: Secondary | ICD-10-CM

## 2014-09-03 DIAGNOSIS — F32A Depression, unspecified: Secondary | ICD-10-CM

## 2014-09-03 DIAGNOSIS — IMO0001 Reserved for inherently not codable concepts without codable children: Secondary | ICD-10-CM | POA: Diagnosis not present

## 2014-09-03 DIAGNOSIS — Z13220 Encounter for screening for lipoid disorders: Secondary | ICD-10-CM

## 2014-09-03 DIAGNOSIS — D509 Iron deficiency anemia, unspecified: Secondary | ICD-10-CM

## 2014-09-03 DIAGNOSIS — D649 Anemia, unspecified: Secondary | ICD-10-CM

## 2014-09-03 DIAGNOSIS — F3289 Other specified depressive episodes: Secondary | ICD-10-CM

## 2014-09-03 DIAGNOSIS — Z79899 Other long term (current) drug therapy: Secondary | ICD-10-CM

## 2014-09-03 LAB — CBC WITH DIFFERENTIAL/PLATELET
BASOS ABS: 0 10*3/uL (ref 0.0–0.1)
BASOS PCT: 0 % (ref 0–1)
EOS ABS: 0.1 10*3/uL (ref 0.0–0.7)
EOS PCT: 2 % (ref 0–5)
HCT: 35.5 % — ABNORMAL LOW (ref 36.0–46.0)
Hemoglobin: 11.7 g/dL — ABNORMAL LOW (ref 12.0–15.0)
Lymphocytes Relative: 51 % — ABNORMAL HIGH (ref 12–46)
Lymphs Abs: 2.8 10*3/uL (ref 0.7–4.0)
MCH: 26.2 pg (ref 26.0–34.0)
MCHC: 33 g/dL (ref 30.0–36.0)
MCV: 79.4 fL (ref 78.0–100.0)
MONO ABS: 0.3 10*3/uL (ref 0.1–1.0)
Monocytes Relative: 6 % (ref 3–12)
Neutro Abs: 2.3 10*3/uL (ref 1.7–7.7)
Neutrophils Relative %: 41 % — ABNORMAL LOW (ref 43–77)
Platelets: 361 10*3/uL (ref 150–400)
RBC: 4.47 MIL/uL (ref 3.87–5.11)
RDW: 14.9 % (ref 11.5–15.5)
WBC: 5.5 10*3/uL (ref 4.0–10.5)

## 2014-09-03 LAB — BASIC METABOLIC PANEL
BUN: 11 mg/dL (ref 6–23)
CO2: 28 mEq/L (ref 19–32)
CREATININE: 0.97 mg/dL (ref 0.50–1.10)
Calcium: 9.2 mg/dL (ref 8.4–10.5)
Chloride: 102 mEq/L (ref 96–112)
GLUCOSE: 115 mg/dL — AB (ref 70–99)
POTASSIUM: 4.1 meq/L (ref 3.5–5.3)
Sodium: 140 mEq/L (ref 135–145)

## 2014-09-03 LAB — LDL CHOLESTEROL, DIRECT: LDL DIRECT: 122 mg/dL — AB

## 2014-09-03 NOTE — Assessment & Plan Note (Signed)
Asymptomatic at this time  Compliant with daily iron  Will check CBC today but expect should be normal F/up colonoscopy 5 yrs from date

## 2014-09-03 NOTE — Patient Instructions (Addendum)
Ms Mitnick, It was great to meet you today  Lets check some lab work to make sure everything looks okay  Please let us know should you be interested in medications to help with mood  If you have more thoughts of wanting to end your life or hurting yourself please call 911  Looking forward to seeing you soon Please make an appointment in 3 months Bernadene Bell, MD  Psychiatry and [place], [place]  [phone]  Psychiatrists  [facility]  [address] [place]  [place], [place] [address] [place], [place] [address]  [phone]  Dr. Norma Fredrickson [facility]  [address] [place] [address] [place] [address]  [phone]  Sheralyn Boatman, [place]  [address] [place]  [place] [address] [place] [address]  [phone]  Therapists  [facility]  [address] [place], [place] [phone]  [facility]  [address] [place] [address] [place], [place] [phone]  [facility]  [address], [place]  [place], [place] [address] [place], [place] [address]  [phone]  [facility]  [address] [place], [place] [address] [place], [place] [address]  [phone]  These referrals have been provided to you as appropriate for your clinical needs while taking into account your financial concerns. Please be aware  that agencies, practitioners and insurance companies sometimes change contracts. When calling to make an appointment have your insurance information available so the professional you are going to see can confirm whether they are covered by your plan. Take this form with you in case the person you are seeing needs a copy or to contact us.

## 2014-09-03 NOTE — Assessment & Plan Note (Signed)
At goal  Well controlled Would benefit from increased exercise and diet control  (i.e. DASH diet) F/up in 3 months time

## 2014-09-03 NOTE — Progress Notes (Signed)
Patient ID: Roberta [name], female   DOB: 1956/10/24, 58 y.o.   MRN: [id number]   [facility] Bernadene Bell, MD Phone: [phone]  Subjective:  [name] presents for routine f/up  # anemia -is currently taking iron supplements  -hgb 12.3 at last lab 06/18/13 -pt with colonoscopy every 5 years- no polyps seen but did have gastric polyp which was benign  -felt that fleeting pain/faintness may be coming from nerve compression -denies blood loss   #depression/weight gain -has not been exercising lately because of daughter and grandchild moving in with her (daughter has mental health issues) -is currently working on cooking healthy foods  -currently going to physical therapy for neck and shoulder  -has been talking to son a lot to deal with feelings- had thoughts of potentially ending her life  #HTN -currently taking HCTZ and lisinopril  -asymptomatic at this time -no problems with compliance   All relevant systems were reviewed and were negative unless otherwise noted in the HPI  Past Medical History Reviewed problem list.  Medications- reviewed and updated Current Outpatient Prescriptions  Medication Sig Dispense Refill  . albuterol (PROAIR HFA) 108 (90 BASE) MCG/ACT inhaler Inhale 2 puffs into the lungs every 4 (four) hours as needed.  2 Inhaler  11  . aspirin 81 MG tablet Take 81 mg by mouth daily.      . Cholecalciferol (VITAMIN D-3) 1000 UNITS CAPS Take 3 capsules by mouth daily.      . cyclobenzaprine (FLEXERIL) 5 MG tablet Take 5 mg by mouth 3 (three) times daily as needed for muscle spasms.      . hydrochlorothiazide (HYDRODIURIL) 25 MG tablet Take 1 tablet (25 mg total) by mouth daily.  90 tablet  3  . lisinopril (PRINIVIL,ZESTRIL) 10 MG tablet TAKE 1 TABLET BY MOUTH EVERY DAY  90 tablet  3  . NEXIUM 40 MG capsule TAKE ONE CAPSULE BY MOUTH EVERY MORNING BEFORE BREAKFAST  90 capsule  0  . traMADol (ULTRAM) 50 MG tablet Take 50 mg by mouth every  6 (six) hours as needed.         No current facility-administered medications for this visit.   Chief complaint-noted No additions to family history Social history- patient is a never smoker  Objective: BP 113/73  Pulse 78  Temp(Src) 98.2 F (36.8 C) (Oral)  Ht 5\' 6"  (1.676 m)  Wt 258 lb (117.028 kg)  BMI 41.66 kg/m2 Gen: NAD, alert, cooperative with exam HEENT: NCAT, no pale conjunctiva noted  Neck: FROM, supple CV: RR Ext: No edema, warm, normal tone, moves UE/LE spontaneously Neuro: Alert and oriented, No gross deficits Psych: mood "im fine" affect mood congruent; tp non circumferential or tangential, tc no SI/HI currently judgement/insight good  Skin: no rashes no lesions  Assessment/Plan: See problem based a/p

## 2014-09-03 NOTE — Assessment & Plan Note (Addendum)
Prior SI without active plan Currently not having depressive sx Not interested in mood medications at this time Would obtain CBC, BMET, LDL for now  Referral to SW Given resource for psych in the area Instructed reasons to call Pt to make plan with sons for coping 911 if SI returns

## 2014-09-04 ENCOUNTER — Telehealth: Payer: Self-pay | Admitting: Clinical

## 2014-09-04 ENCOUNTER — Telehealth: Payer: Self-pay | Admitting: Family Medicine

## 2014-09-04 NOTE — Telephone Encounter (Signed)
Given resources for dietary modification of cholesterol and lifestyle changes Pt will read and attempt to make changes herself before being placed on statin [facility], MD

## 2014-09-04 NOTE — Telephone Encounter (Signed)
CSW contacted pt to provide resources. Pt is interested in a list of affordable housing for pt's daughter. CSW informed pt that CSW could populate a list and mail/email it. Pt requested to have the list emailed. Pt also informed CSW that she is looking for a psychologist that accepts her UHC/Medicare and Medicaid. CSW encouraged pt to contact her UHC/Medicare and request that they give her a list of providers that accept her insurance. Once pt receives that list pt can call the agencies and inquire as to whether they also accept Medicaid. Pt very appreciative of direction and denied having any other questions.  Hunt Oris, MSW, [place]

## 2014-09-05 ENCOUNTER — Ambulatory Visit: Payer: Medicare Other | Admitting: Physical Therapy

## 2014-09-05 ENCOUNTER — Encounter: Payer: Medicare Other | Admitting: Physical Therapy

## 2014-09-05 DIAGNOSIS — IMO0001 Reserved for inherently not codable concepts without codable children: Secondary | ICD-10-CM | POA: Diagnosis not present

## 2014-09-09 ENCOUNTER — Telehealth: Payer: Self-pay | Admitting: Family Medicine

## 2014-09-09 NOTE — Telephone Encounter (Signed)
whats going on here? Vertigo attack or what? [facility], MD

## 2014-09-09 NOTE — Telephone Encounter (Signed)
Pt called and needs refills on her Antivert and Zofran called in. Please call if you have any questions or need the patient to be seen first. [phone]. jw

## 2014-09-10 ENCOUNTER — Ambulatory Visit: Payer: Medicare Other | Admitting: Physical Therapy

## 2014-09-10 DIAGNOSIS — IMO0001 Reserved for inherently not codable concepts without codable children: Secondary | ICD-10-CM | POA: Diagnosis not present

## 2014-09-10 NOTE — Telephone Encounter (Signed)
LMOVM for pt to return call.  Is she still having dizziness and vomiting?  Is it worse or about the same?  If worse will likely need an appt to discuss. [place], Salome Spotted

## 2014-09-12 ENCOUNTER — Ambulatory Visit: Payer: Medicare Other | Admitting: Physical Therapy

## 2014-09-12 DIAGNOSIS — IMO0001 Reserved for inherently not codable concepts without codable children: Secondary | ICD-10-CM | POA: Diagnosis not present

## 2014-09-24 ENCOUNTER — Ambulatory Visit (INDEPENDENT_AMBULATORY_CARE_PROVIDER_SITE_OTHER): Payer: Medicare Other | Admitting: Family Medicine

## 2014-09-24 ENCOUNTER — Ambulatory Visit: Payer: Medicare Other | Attending: Orthopedic Surgery | Admitting: Physical Therapy

## 2014-09-24 ENCOUNTER — Encounter: Payer: Self-pay | Admitting: Family Medicine

## 2014-09-24 VITALS — BP 100/67 | HR 82 | Temp 98.2°F | Resp 20 | Wt 258.0 lb

## 2014-09-24 DIAGNOSIS — N950 Postmenopausal bleeding: Secondary | ICD-10-CM | POA: Insufficient documentation

## 2014-09-24 DIAGNOSIS — M6281 Muscle weakness (generalized): Secondary | ICD-10-CM | POA: Diagnosis not present

## 2014-09-24 DIAGNOSIS — R293 Abnormal posture: Secondary | ICD-10-CM | POA: Insufficient documentation

## 2014-09-24 DIAGNOSIS — N939 Abnormal uterine and vaginal bleeding, unspecified: Secondary | ICD-10-CM

## 2014-09-24 DIAGNOSIS — M542 Cervicalgia: Secondary | ICD-10-CM | POA: Diagnosis not present

## 2014-09-24 DIAGNOSIS — H8113 Benign paroxysmal vertigo, bilateral: Secondary | ICD-10-CM

## 2014-09-24 DIAGNOSIS — H8110 Benign paroxysmal vertigo, unspecified ear: Secondary | ICD-10-CM | POA: Insufficient documentation

## 2014-09-24 DIAGNOSIS — Z5189 Encounter for other specified aftercare: Secondary | ICD-10-CM | POA: Diagnosis present

## 2014-09-24 HISTORY — DX: Postmenopausal bleeding: N95.0

## 2014-09-24 MED ORDER — ONDANSETRON HCL 4 MG PO TABS: 4.0000 mg | ORAL_TABLET | Freq: Three times a day (TID) | ORAL | Status: DC | PRN

## 2014-09-24 MED ORDER — MECLIZINE HCL 12.5 MG PO TABS: 12.5000 mg | ORAL_TABLET | Freq: Three times a day (TID) | ORAL | Status: DC | PRN

## 2014-09-24 NOTE — Progress Notes (Signed)
   [facility] Bernadene Bell, MD Phone: [phone]  Subjective:  Ms Meany is a 58 y.o F who presents for head pain  # Vertigo/headache? -sx started around sept 23rd with feelings of room spinning  -pt feeling very nauseated assc with this -turning quickly worsens her sx -daughter also with vertigo sx -has had relief in the past with antivert/zofran combination -no assc chest pain, SOB, vision changes  -no peripheral numbness or weakness   #Bleeding -bleeding for 3 days  -went through menopause around age [age] -notes that she "always bleeds when daughter is around"  All relevant systems were reviewed and were negative unless otherwise noted in the HPI  Past Medical History Reviewed problem list.  Medications- reviewed and updated Current Outpatient Prescriptions  Medication Sig Dispense Refill  . albuterol (PROAIR HFA) 108 (90 BASE) MCG/ACT inhaler Inhale 2 puffs into the lungs every 4 (four) hours as needed.  2 Inhaler  11  . aspirin 81 MG tablet Take 81 mg by mouth daily.      . Cholecalciferol (VITAMIN D-3) 1000 UNITS CAPS Take 3 capsules by mouth daily.      . cyclobenzaprine (FLEXERIL) 5 MG tablet Take 5 mg by mouth 3 (three) times daily as needed for muscle spasms.      . hydrochlorothiazide (HYDRODIURIL) 25 MG tablet Take 1 tablet (25 mg total) by mouth daily.  90 tablet  3  . lisinopril (PRINIVIL,ZESTRIL) 10 MG tablet TAKE 1 TABLET BY MOUTH EVERY DAY  90 tablet  3  . NEXIUM 40 MG capsule TAKE ONE CAPSULE BY MOUTH EVERY MORNING BEFORE BREAKFAST  90 capsule  0  . traMADol (ULTRAM) 50 MG tablet Take 50 mg by mouth every 6 (six) hours as needed.         No current facility-administered medications for this visit.   Chief complaint-noted No additions to family history Social history- patient is a never smoker  Objective: BP 100/67  Pulse 82  Temp(Src) 98.2 F (36.8 C) (Oral)  Resp 20  Wt 258 lb (117.028 kg)  SpO2 100% Gen: NAD, alert,  cooperative with exam HEENT: NCAT, EOMI, PERRL Neck: FROM, supple, pain with left and right movements  Neuro: Alert and oriented, No gross deficits Skin: no rashes no lesions  Assessment/Plan: See problem based a/p

## 2014-09-24 NOTE — Patient Instructions (Signed)
Roberta [name] it was great to meet you today!  I am sorry that you are not feeling well Please take antivert and zofran  Lets try to see if PT can do vestibular rehab  We will schedule ultrasound for you for your bleeding  Please return to clinic if symptoms do not improve or worsen Feel better soon Bernadene Bell, MD

## 2014-09-24 NOTE — Assessment & Plan Note (Signed)
Hx of this in the past Sx consistent with flare potentially triggered seasonal changes Improved with H2 Trial of meclizine as needed with zofran Will also refer for vestibular PT

## 2014-09-24 NOTE — Assessment & Plan Note (Signed)
Described as period like menses for last 3 days Nml PAPs per pt report Will send for transvaginal/pelvic US Highly concerning for endometrial hyperplasia Has questionable hx of fibroid in the past?

## 2014-09-27 ENCOUNTER — Ambulatory Visit: Payer: Medicare Other | Admitting: Physical Therapy

## 2014-09-27 DIAGNOSIS — Z5189 Encounter for other specified aftercare: Secondary | ICD-10-CM | POA: Diagnosis not present

## 2014-10-01 ENCOUNTER — Ambulatory Visit (HOSPITAL_COMMUNITY)
Admission: RE | Admit: 2014-10-01 | Discharge: 2014-10-01 | Disposition: A | Discharge status: Home / Self Care | Payer: Medicare Other | Source: Ambulatory Visit | Attending: Family Medicine | Admitting: Family Medicine

## 2014-10-01 ENCOUNTER — Ambulatory Visit: Payer: Medicare Other | Admitting: Physical Therapy

## 2014-10-01 DIAGNOSIS — N950 Postmenopausal bleeding: Secondary | ICD-10-CM | POA: Insufficient documentation

## 2014-10-01 DIAGNOSIS — R938 Abnormal findings on diagnostic imaging of other specified body structures: Secondary | ICD-10-CM | POA: Insufficient documentation

## 2014-10-01 DIAGNOSIS — D259 Leiomyoma of uterus, unspecified: Secondary | ICD-10-CM | POA: Diagnosis not present

## 2014-10-01 DIAGNOSIS — Z5189 Encounter for other specified aftercare: Secondary | ICD-10-CM | POA: Diagnosis not present

## 2014-10-01 DIAGNOSIS — N939 Abnormal uterine and vaginal bleeding, unspecified: Secondary | ICD-10-CM

## 2014-10-01 NOTE — Telephone Encounter (Signed)
Bleeding likely related to fibroids [place] looks ok If bothersome, could send to gyne If still persistent would try megace or variant  [facility], MD

## 2014-10-04 ENCOUNTER — Ambulatory Visit: Payer: Medicare Other | Admitting: Physical Therapy

## 2014-10-04 DIAGNOSIS — Z5189 Encounter for other specified aftercare: Secondary | ICD-10-CM | POA: Diagnosis not present

## 2014-10-05 ENCOUNTER — Encounter: Payer: Self-pay | Admitting: Family Medicine

## 2014-10-21 ENCOUNTER — Encounter: Payer: Self-pay | Admitting: Family Medicine

## 2014-10-21 NOTE — Progress Notes (Unsigned)
Nexium is no longer covered by patient's insurance. She brings in a form to be completed by Dr. Skeet Simmer to provides alternatives drugs to be prescribe. Please completed form and return to insurance company. Form placed in [facility] team folder.

## 2014-10-21 NOTE — Progress Notes (Unsigned)
Form placed in provider's box.  Izsak Meir,CMA  

## 2014-10-21 NOTE — Progress Notes (Unsigned)
Will review in the next 1-2 days Long term use of PPI not advised at this point [facility], MD

## 2014-10-23 ENCOUNTER — Encounter: Payer: Self-pay | Admitting: Family Medicine

## 2014-10-23 MED ORDER — TRAVOPROST (BAK FREE) 0.004 % OP SOLN: 1.0000 [drp] | Freq: Every day | OPHTHALMIC | Status: AC

## 2014-10-23 MED ORDER — ESOMEPRAZOLE MAGNESIUM 20 MG PO PACK: 20.0000 mg | PACK | Freq: Every day | ORAL | Status: DC

## 2014-10-24 ENCOUNTER — Telehealth: Payer: Self-pay | Admitting: Family Medicine

## 2014-10-24 NOTE — Telephone Encounter (Signed)
The generic RX for Nexium was called in for the powder form.  Needs to be a pill/capsule. If dr sends the exception paperwork to insurance, she can still stay on the Neximum capsule. Please advise

## 2014-10-24 NOTE — Telephone Encounter (Signed)
Dr Skeet Simmer,  Did you mean to call in the powder nexium? Abigael Mogle, Salome Spotted

## 2014-10-24 NOTE — Telephone Encounter (Signed)
i didn't see an option for generic pill? If there is one then that's fine  [facility], MD

## 2014-12-11 ENCOUNTER — Encounter: Payer: Self-pay | Admitting: Family Medicine

## 2014-12-11 ENCOUNTER — Other Ambulatory Visit: Payer: Self-pay | Admitting: *Deleted

## 2014-12-11 ENCOUNTER — Other Ambulatory Visit: Payer: Self-pay | Admitting: Family Medicine

## 2014-12-11 DIAGNOSIS — I10 Essential (primary) hypertension: Secondary | ICD-10-CM

## 2014-12-11 MED ORDER — LISINOPRIL 10 MG PO TABS: 10.0000 mg | ORAL_TABLET | Freq: Every day | ORAL | Status: DC

## 2015-01-15 ENCOUNTER — Ambulatory Visit (INDEPENDENT_AMBULATORY_CARE_PROVIDER_SITE_OTHER): Payer: Medicare Other | Admitting: Family Medicine

## 2015-01-15 ENCOUNTER — Encounter: Payer: Self-pay | Admitting: Family Medicine

## 2015-01-15 VITALS — BP 100/70 | HR 70 | Temp 97.3°F | Ht 66.0 in | Wt 256.4 lb

## 2015-01-15 DIAGNOSIS — S8991XA Unspecified injury of right lower leg, initial encounter: Secondary | ICD-10-CM

## 2015-01-15 DIAGNOSIS — R270 Ataxia, unspecified: Secondary | ICD-10-CM | POA: Diagnosis not present

## 2015-01-15 DIAGNOSIS — I10 Essential (primary) hypertension: Secondary | ICD-10-CM

## 2015-01-15 DIAGNOSIS — H9191 Unspecified hearing loss, right ear: Secondary | ICD-10-CM

## 2015-01-15 MED ORDER — NAPROXEN 500 MG PO TABS: 500.0000 mg | ORAL_TABLET | Freq: Two times a day (BID) | ORAL | Status: DC

## 2015-01-15 NOTE — Patient Instructions (Signed)
Thank you for coming to see me today. It was a pleasure. Today we talked about:   Hypertension: Since you are having some symptoms with your low blood pressure, I will discontinue your lisinopril. Please continue taking hydrochlorothiazide.  Hearing loss: I will refer you to audiology.  Unstable gait/walking: I am getting an MRI of your brain to make sure there is nothing worrisome going on. If this is negative, I will refer you to the ENT (ear nose throat) doctors  Knee pain: I have prescribed naproxen 500mg . Take this twice daily. Also, please use ice for 20-30 minutes a few times per day to help with the swelling. If symptoms do not improve in the next week or two, we may get an x-ray to make sure there is no fracture.  Please make an appointment to see me in one month for follow-up.  If you have any questions or concerns, please do not hesitate to call the office at [phone].  Sincerely,  Cordelia Poche, MD

## 2015-01-15 NOTE — Progress Notes (Signed)
    Subjective    Roberta [name] is a 59 y.o. female that presents for an office visit.   1. Hypertension: some lightheadedness when standing up or turning too fast. She is taking lisinopril 10mg  once daily and hydrochlorothiazide 25mg .   2. Vertigo: Chronic issue. She uses meclizine and zofran as needed for symptoms. Symptoms usually occur when seasons change. Has received physical therapy in the past. She feels like her symptoms have worsened since her fall three days ago.  3. Hearing loss: symptoms started about 4 years. Present in right ear. Symptoms presented after patient had had symptoms of vertigo for about 10-15 years prior. Symptoms have worsened. She has to turn to the side to hear better. She has never been evaluated.  4. Knee pain: symptoms started three days ago after tripping over a toy. Her knee and elbow hit the ground. She did not hit her head. She has been elevating her leg for treatment. She has not taken any medications for the pain. Pain occurs with moving or flexing. She has some stumbling that has increased after the injury, but was also present prior to the injury.  History  Substance Use Topics  . Smoking status: Never Smoker   . Smokeless tobacco: Not on file  . Alcohol Use: No    Allergies  Allergen Reactions  . Influenza Vaccines     Throat swelling reported after flu shot in the past (cannot verify with records but would not give)  . Budesonide     Throat infection per patient  . Codeine     REACTION: Nausea  . Prednisone     REACTION: ?increased vaginal bleeding    No orders of the defined types were placed in this encounter.    ROS  Per HPI   Objective   BP 100/70 mmHg  Pulse 70  Temp(Src) 97.3 F (36.3 C) (Oral)  Ht 5\' 6"  (1.676 m)  Wt 256 lb 6.4 oz (116.302 kg)  BMI 41.40 kg/m2  General: Well appearing female, no distress HEENT: TMs clear bilaterally, no nystagmus noted Musculoskeletal: right patellar tenderness, no crepitus, full  range of motion, negative valgus and varus Neuro: CN intact. 2+ reflexes. +dysdiadokinesia, +dysmetria, could not perform heel to shin, gait very unsteady. Weber lateralized to right ear and AC>BC bilaterally  Assessment and Plan   Please refer to problem based charting of assessment and plan

## 2015-01-19 DIAGNOSIS — H9191 Unspecified hearing loss, right ear: Secondary | ICD-10-CM | POA: Insufficient documentation

## 2015-01-19 DIAGNOSIS — H903 Sensorineural hearing loss, bilateral: Secondary | ICD-10-CM | POA: Insufficient documentation

## 2015-01-19 DIAGNOSIS — H9190 Unspecified hearing loss, unspecified ear: Secondary | ICD-10-CM | POA: Insufficient documentation

## 2015-01-19 DIAGNOSIS — S8990XA Unspecified injury of unspecified lower leg, initial encounter: Secondary | ICD-10-CM | POA: Insufficient documentation

## 2015-01-19 DIAGNOSIS — R270 Ataxia, unspecified: Secondary | ICD-10-CM | POA: Insufficient documentation

## 2015-01-19 NOTE — Assessment & Plan Note (Signed)
Recent trauma. Exam with point tenderness, although patient able to walk on leg without discomfort.  Naproxen 500mg   Ice daily, multiple times per day  Right Knee x-ray if symptoms fail to improve within the [facility]

## 2015-01-19 NOTE — Assessment & Plan Note (Signed)
Blood pressure currently controlled but patient possibly symptomatic. She has been on both lisinopril and hydrochlorothiazide for greater than 20 years  Discontinue lisinopril  Continue hydrochlorothiazide  Follow-up blood pressures to see if patient will need to be restarted on lower dose of lisinopril

## 2015-01-19 NOTE — Assessment & Plan Note (Signed)
Weber and rinne test imply left ear problem, rather than right ear.  Audiology consult  Depending on results (and results of MRI), will likely refer to ENT

## 2015-01-19 NOTE — Assessment & Plan Note (Signed)
Exam concerning for primary brain lesion/injury. Patient with long history of these symptoms but recently worsened.   MRI brain

## 2015-01-20 DIAGNOSIS — H9311 Tinnitus, right ear: Secondary | ICD-10-CM | POA: Diagnosis not present

## 2015-01-20 DIAGNOSIS — H4011X1 Primary open-angle glaucoma, mild stage: Secondary | ICD-10-CM | POA: Diagnosis not present

## 2015-01-20 DIAGNOSIS — H905 Unspecified sensorineural hearing loss: Secondary | ICD-10-CM | POA: Diagnosis not present

## 2015-01-27 ENCOUNTER — Ambulatory Visit
Admission: RE | Admit: 2015-01-27 | Discharge: 2015-01-27 | Disposition: A | Discharge status: Home / Self Care | Payer: Medicare Other | Source: Ambulatory Visit | Attending: Family Medicine | Admitting: Family Medicine

## 2015-01-27 DIAGNOSIS — H0520 Unspecified exophthalmos: Secondary | ICD-10-CM | POA: Diagnosis not present

## 2015-01-27 DIAGNOSIS — E236 Other disorders of pituitary gland: Secondary | ICD-10-CM | POA: Diagnosis not present

## 2015-01-27 DIAGNOSIS — H9191 Unspecified hearing loss, right ear: Secondary | ICD-10-CM

## 2015-01-27 DIAGNOSIS — R42 Dizziness and giddiness: Secondary | ICD-10-CM | POA: Diagnosis not present

## 2015-01-27 MED ORDER — GADOBENATE DIMEGLUMINE 529 MG/ML IV SOLN
20.0000 mL | Freq: Once | INTRAVENOUS | Status: AC | PRN
  Administered 2015-01-27: 20 mL via INTRAVENOUS

## 2015-01-28 ENCOUNTER — Telehealth: Payer: Self-pay | Admitting: Family Medicine

## 2015-01-28 DIAGNOSIS — R270 Ataxia, unspecified: Secondary | ICD-10-CM

## 2015-01-28 DIAGNOSIS — H9191 Unspecified hearing loss, right ear: Secondary | ICD-10-CM

## 2015-01-28 NOTE — Telephone Encounter (Signed)
Patient came into office at 5:30 state that MD left her a message to return call. She is wanting to know the results of her MRI. Will forward message to Dr Lonny Prude to call patient back.Busick, Kevin Fenton

## 2015-01-29 NOTE — Telephone Encounter (Signed)
Discussed results of MRI. No reason for patient's symptoms identified. Patient interested in ENT referral. Will place referral for ENT follow-up

## 2015-01-29 NOTE — Telephone Encounter (Signed)
Pt called again and would like to know what her MRI results are. Roberta [name]

## 2015-02-12 DIAGNOSIS — H5213 Myopia, bilateral: Secondary | ICD-10-CM | POA: Diagnosis not present

## 2015-03-03 DIAGNOSIS — H18423 Band keratopathy, bilateral: Secondary | ICD-10-CM | POA: Diagnosis not present

## 2015-03-03 DIAGNOSIS — H2511 Age-related nuclear cataract, right eye: Secondary | ICD-10-CM | POA: Diagnosis not present

## 2015-03-03 DIAGNOSIS — H4011X0 Primary open-angle glaucoma, stage unspecified: Secondary | ICD-10-CM | POA: Diagnosis not present

## 2015-03-03 DIAGNOSIS — H25011 Cortical age-related cataract, right eye: Secondary | ICD-10-CM | POA: Diagnosis not present

## 2015-03-07 ENCOUNTER — Encounter: Payer: Self-pay | Admitting: Family Medicine

## 2015-03-17 ENCOUNTER — Encounter: Payer: Self-pay | Admitting: Family Medicine

## 2015-03-17 ENCOUNTER — Ambulatory Visit (INDEPENDENT_AMBULATORY_CARE_PROVIDER_SITE_OTHER): Payer: Medicare Other | Admitting: Family Medicine

## 2015-03-17 VITALS — BP 119/82 | HR 96 | Temp 98.2°F | Ht 66.0 in | Wt 258.2 lb

## 2015-03-17 DIAGNOSIS — I10 Essential (primary) hypertension: Secondary | ICD-10-CM | POA: Diagnosis not present

## 2015-03-17 DIAGNOSIS — F32A Depression, unspecified: Secondary | ICD-10-CM

## 2015-03-17 DIAGNOSIS — N950 Postmenopausal bleeding: Secondary | ICD-10-CM

## 2015-03-17 DIAGNOSIS — F329 Major depressive disorder, single episode, unspecified: Secondary | ICD-10-CM

## 2015-03-17 MED ORDER — SERTRALINE HCL 50 MG PO TABS: 50.0000 mg | ORAL_TABLET | Freq: Every day | ORAL | Status: DC

## 2015-03-17 NOTE — Progress Notes (Signed)
    Subjective    Roberta [name] is a 59 y.o. female that presents for an office visit.   1. Hypertension: Stable. She is currently taking hctz 25mg  daily and lisinopril 10mg  QOD. She has no chest pain or shortness of breath  2. Depression: symptoms have worsened especially with daughter and grandchild living with her. She has not been to church in the last 4 weeks. She has not been exercising as much. Has a recent (2 week ago) episode of wanting to harm herself. Reading her bible has helped. She would like to be around for her 24 month grandson as he "loves her to death." She has seen a therapist in the past which really helped with her depression.  3. Postmenopausal bleeding: Symptoms resolved.  History  Substance Use Topics  . Smoking status: Never Smoker   . Smokeless tobacco: Not on file  . Alcohol Use: No    Allergies  Allergen Reactions  . Influenza Vaccines     Throat swelling reported after flu shot in the past (cannot verify with records but would not give)  . Budesonide     Throat infection per patient  . Codeine     REACTION: Nausea  . Prednisone     REACTION: ?increased vaginal bleeding    Meds ordered this encounter  Medications  . sertraline (ZOLOFT) 50 MG tablet    Sig: Take 1 tablet (50 mg total) by mouth daily.    Dispense:  30 tablet    Refill:  0    ROS  Per HPI  Objective   BP 119/82 mmHg  Pulse 96  Temp(Src) 98.2 F (36.8 C) (Oral)  Ht 5\' 6"  (1.676 m)  Wt 258 lb 3.2 oz (117.119 kg)  BMI 41.69 kg/m2  General: Well appearing Psych: Flat affect, normal speech, minimal eye contact. No SI  Assessment and Plan   Please refer to problem based charting of assessment and plan

## 2015-03-17 NOTE — Patient Instructions (Addendum)
Thank you for coming to see me today. It was a pleasure. Today we talked about:   Depression: We discussed starting you on a medication to help with your mood. I also gave you resources for therapy services. If you have any thoughts of hurting yourself, please call 911 immediately  Hypertension: Your blood pressure is controlled. Great job!  Postmenopausal bleeding: this appears to be resolved. Please let me know if your bleeding ever returns  Please make an appointment to see me in 2 weeks for follow-up.  If you have any questions or concerns, please do not hesitate to call the office at [phone].  Sincerely,  Cordelia Poche, MD   Sertraline tablets What is this medicine? SERTRALINE (SER tra leen) is used to treat depression. It may also be used to treat obsessive compulsive disorder, panic disorder, post-trauma stress, premenstrual dysphoric disorder (PMDD) or social anxiety. This medicine may be used for other purposes; ask your health care provider or pharmacist if you have questions. COMMON BRAND NAME(S): Zoloft What should I tell my health care provider before I take this medicine? They need to know if you have any of these conditions: -bipolar disorder or a family history of bipolar disorder -diabetes -glaucoma -heart disease -high blood pressure -history of irregular heartbeat -history of low levels of calcium, magnesium, or potassium in the blood -if you often drink alcohol -liver disease -receiving electroconvulsive therapy -seizures -suicidal thoughts, plans, or attempt; a previous suicide attempt by you or a family member -thyroid disease -an unusual or allergic reaction to sertraline, other medicines, foods, dyes, or preservatives -pregnant or trying to get pregnant -breast-feeding How should I use this medicine? Take this medicine by mouth with a glass of water. Follow the directions on the prescription label. You can take it with or without food. Take your  medicine at regular intervals. Do not take your medicine more often than directed. Do not stop taking this medicine suddenly except upon the advice of your doctor. Stopping this medicine too quickly may cause serious side effects or your condition may worsen. A special MedGuide will be given to you by the pharmacist with each prescription and refill. Be sure to read this information carefully each time. Talk to your pediatrician regarding the use of this medicine in children. While this drug may be prescribed for children as young as 7 years for selected conditions, precautions do apply. Overdosage: If you think you have taken too much of this medicine contact a poison control center or emergency room at once. NOTE: This medicine is only for you. Do not share this medicine with others. What if I miss a dose? If you miss a dose, take it as soon as you can. If it is almost time for your next dose, take only that dose. Do not take double or extra doses. What may interact with this medicine? Do not take this medicine with any of the following medications: -certain medicines for fungal infections like fluconazole, itraconazole, ketoconazole, posaconazole, voriconazole -cisapride -disulfiram -dofetilide -linezolid -MAOIs like Carbex, Eldepryl, Marplan, Nardil, and Parnate -metronidazole -methylene blue (injected into a vein) -pimozide -thioridazine -ziprasidone This medicine may also interact with the following medications: -alcohol -aspirin and aspirin-like medicines -certain medicines for depression, anxiety, or psychotic disturbances -certain medicines for irregular heart beat like flecainide, propafenone -certain medicines for migraine headaches like almotriptan, eletriptan, frovatriptan, naratriptan, rizatriptan, sumatriptan, zolmitriptan -certain medicines for sleep -certain medicines for seizures like carbamazepine, valproic acid, phenytoin -certain medicines that treat or prevent blood  clots like warfarin, enoxaparin, dalteparin -cimetidine -digoxin -diuretics -fentanyl -furazolidone -isoniazid -lithium -NSAIDs, medicines for pain and inflammation, like ibuprofen or naproxen -other medicines that prolong the QT interval (cause an abnormal heart rhythm) -procarbazine -rasagiline -supplements like St. John's wort, kava kava, valerian -tolbutamide -tramadol -tryptophan This list may not describe all possible interactions. Give your health care provider a list of all the medicines, herbs, non-prescription drugs, or dietary supplements you use. Also tell them if you smoke, drink alcohol, or use illegal drugs. Some items may interact with your medicine. What should I watch for while using this medicine? Tell your doctor if your symptoms do not get better or if they get worse. Visit your doctor or health care professional for regular checks on your progress. Because it may take several weeks to see the full effects of this medicine, it is important to continue your treatment as prescribed by your doctor. Patients and their families should watch out for new or worsening thoughts of suicide or depression. Also watch out for sudden changes in feelings such as feeling anxious, agitated, panicky, irritable, hostile, aggressive, impulsive, severely restless, overly excited and hyperactive, or not being able to sleep. If this happens, especially at the beginning of treatment or after a change in dose, call your health care professional. Dennis Bast may get drowsy or dizzy. Do not drive, use machinery, or do anything that needs mental alertness until you know how this medicine affects you. Do not stand or sit up quickly, especially if you are an older patient. This reduces the risk of dizzy or fainting spells. Alcohol may interfere with the effect of this medicine. Avoid alcoholic drinks. Your mouth may get dry. Chewing sugarless gum or sucking hard candy, and drinking plenty of water may help.  Contact your doctor if the problem does not go away or is severe. What side effects may I notice from receiving this medicine? Side effects that you should report to your doctor or health care professional as soon as possible: -allergic reactions like skin rash, itching or hives, swelling of the face, lips, or tongue -black or bloody stools, blood in the urine or vomit -fast, irregular heartbeat -feeling faint or lightheaded, falls -hallucination, loss of contact with reality -seizures -suicidal thoughts or other mood changes -unusual bleeding or bruising -unusually weak or tired -vomiting Side effects that usually do not require medical attention (report to your doctor or health care professional if they continue or are bothersome): -change in appetite -change in sex drive or performance -diarrhea -increased sweating -indigestion, nausea -tremors This list may not describe all possible side effects. Call your doctor for medical advice about side effects. You may report side effects to FDA at 1-800-FDA-1088. Where should I keep my medicine? Keep out of the reach of children. Store at room temperature between 15 and 30 degrees C (59 and 86 degrees F). Throw away any unused medicine after the expiration date. NOTE: This sheet is a summary. It may not cover all possible information. If you have questions about this medicine, talk to your doctor, pharmacist, or health care provider.  2015, Elsevier/Gold Standard. (2013-07-03 12:57:35)

## 2015-03-17 NOTE — Assessment & Plan Note (Signed)
Resolved. No recurrent symptoms.

## 2015-03-17 NOTE — Assessment & Plan Note (Addendum)
Patient with no current SI. Depression appears to be longstanding and worsening. Patient initially weary of starting a medication for control but opened up. Patient also going to restart exercising regimen which has helped her mood in the past. Interested in seeing a therapist  Zoloft 50mg  daily  Information for therapy resources  Discussed return precautions  Discussed emergency precautions  Follow-up in 2 weeks

## 2015-03-17 NOTE — Assessment & Plan Note (Signed)
At goal  Continue hydrochlorothiazide 25mg  daily  Continue lisinopril 10mg  qod

## 2015-03-20 ENCOUNTER — Encounter: Payer: Self-pay | Admitting: Family Medicine

## 2015-03-25 DIAGNOSIS — Z1231 Encounter for screening mammogram for malignant neoplasm of breast: Secondary | ICD-10-CM | POA: Diagnosis not present

## 2015-04-03 ENCOUNTER — Encounter: Payer: Self-pay | Admitting: Family Medicine

## 2015-04-08 ENCOUNTER — Ambulatory Visit (INDEPENDENT_AMBULATORY_CARE_PROVIDER_SITE_OTHER): Payer: Medicare Other | Admitting: Family Medicine

## 2015-04-08 ENCOUNTER — Encounter: Payer: Self-pay | Admitting: Family Medicine

## 2015-04-08 VITALS — BP 114/78 | HR 86 | Temp 98.2°F | Ht 66.0 in | Wt 254.0 lb

## 2015-04-08 DIAGNOSIS — I10 Essential (primary) hypertension: Secondary | ICD-10-CM | POA: Diagnosis not present

## 2015-04-08 DIAGNOSIS — F329 Major depressive disorder, single episode, unspecified: Secondary | ICD-10-CM

## 2015-04-08 DIAGNOSIS — M79671 Pain in right foot: Secondary | ICD-10-CM

## 2015-04-08 DIAGNOSIS — F32A Depression, unspecified: Secondary | ICD-10-CM

## 2015-04-08 MED ORDER — LISINOPRIL 5 MG PO TABS: 5.0000 mg | ORAL_TABLET | Freq: Every day | ORAL | Status: DC

## 2015-04-08 NOTE — Progress Notes (Signed)
    Subjective    Roberta [name] is a 59 y.o. female that presents for an office visit.   1. Depression: Feeling better. Her daughter now has a job and is now happier which makes her feel better. She recently went to church which she hasn't done in 5 weeks. She has been speaking with her cousin, which has helped. No suicidal ideation. She is interested in therapy but does not want to start before finishing her semester (which ends in two weeks).  2. Foot trauma: She thinks she dropped something on her foot about one week ago. Swelling has remained constant. She does have pain with palpation. No fevers. She is s/p bunionectomy and had some reconstruction performed. The surgery was performed by Dr. Eddie Dibbles at [facility]  3. Hypertension: She is adherent with HCTZ 25mg  qD and lisinopril 5mg  QOD. No headaches, chest pain or shortness of breath.  History  Substance Use Topics  . Smoking status: Never Smoker   . Smokeless tobacco: Not on file  . Alcohol Use: No    Allergies  Allergen Reactions  . Influenza Vaccines     Throat swelling reported after flu shot in the past (cannot verify with records but would not give)  . Budesonide     Throat infection per patient  . Codeine     REACTION: Nausea  . Prednisone     REACTION: ?increased vaginal bleeding    No orders of the defined types were placed in this encounter.    ROS  Per HPI   Objective   BP 114/78 mmHg  Pulse 86  Temp(Src) 98.2 F (36.8 C) (Oral)  Ht 5\' 6"  (1.676 m)  Wt 254 lb (115.214 kg)  BMI 41.02 kg/m2  General: Well appearing female, no distress Musculoskeletal: Right foot: swelling located on dorsal surface. No erythema or ecchymosis. Scar located on medial aspect of first MTP joint. Tenderness over 3rd and 4th metatarsals on dorsal aspect.  Psych: Full affect, normal speech, slightly withdrawn, no suicidal ideation  Assessment and Plan   Please refer to problem based charting of assessment and  plan

## 2015-04-08 NOTE — Patient Instructions (Signed)
Thank you for coming to see me today. It was a pleasure. Today we talked about:   High blood pressure: We will change your lisinopril to 5mg  every day. Your blood pressure was great today.  Depression: I'm glad you have found ways to cope with your mood and to improve your mood. I'm glad things with your daughter are improved. We will pursue therapy after you're done with your classes. We will hold off on using medications  Right foot pain: I will get some imaging of your foot to make sure there is no fracture/disruption of your hardware.   Please make an appointment to see me in 3 months, or sooner, for follow-up.  If you have any questions or concerns, please do not hesitate to call the office at [phone].  Sincerely,  Cordelia Poche, MD

## 2015-04-09 ENCOUNTER — Ambulatory Visit
Admission: RE | Admit: 2015-04-09 | Discharge: 2015-04-09 | Disposition: A | Discharge status: Home / Self Care | Payer: Medicare Other | Source: Ambulatory Visit | Attending: Family Medicine | Admitting: Family Medicine

## 2015-04-09 ENCOUNTER — Telehealth: Payer: Self-pay | Admitting: Family Medicine

## 2015-04-09 DIAGNOSIS — M7989 Other specified soft tissue disorders: Secondary | ICD-10-CM | POA: Diagnosis not present

## 2015-04-09 DIAGNOSIS — M79671 Pain in right foot: Secondary | ICD-10-CM

## 2015-04-09 DIAGNOSIS — S99921A Unspecified injury of right foot, initial encounter: Secondary | ICD-10-CM | POA: Diagnosis not present

## 2015-04-09 NOTE — Assessment & Plan Note (Signed)
Concern for fracture. Complicated by previous surgery with hardware placement  RISE  Right foot x-ray

## 2015-04-09 NOTE — Assessment & Plan Note (Addendum)
Patient states that she did not tolerate SSRI. Honestly, patient was not very excited to start medication and appeared somewhat resistant to therapy. Appears she has other coping mechanisms, including a cousin that she is able to speak with when she is feeling down. She is opting for behavioral therapy but does not wish to start until after her semester is over. Her home situation has improved which has alleviated some of the stressors causing some of her depressed mood.  No current treatment as patient would like to hold off for now  No suicidal ideation

## 2015-04-09 NOTE — Telephone Encounter (Signed)
Pt called and would like the results from her foot x-rays. jw

## 2015-04-09 NOTE — Assessment & Plan Note (Signed)
Currently at goal.  Change to lisinopril 5mg  daily  Continue HCTZ 25mg  daily

## 2015-04-10 DIAGNOSIS — H2511 Age-related nuclear cataract, right eye: Secondary | ICD-10-CM | POA: Diagnosis not present

## 2015-04-10 DIAGNOSIS — H25811 Combined forms of age-related cataract, right eye: Secondary | ICD-10-CM | POA: Diagnosis not present

## 2015-04-11 DIAGNOSIS — H2512 Age-related nuclear cataract, left eye: Secondary | ICD-10-CM | POA: Diagnosis not present

## 2015-04-11 NOTE — Telephone Encounter (Signed)
Discussed results of foot x-ray with patient. Advised to continue RICE therapy. Patient understood and agreed with plan.

## 2015-04-18 ENCOUNTER — Encounter: Payer: Self-pay | Admitting: Family Medicine

## 2015-04-21 ENCOUNTER — Encounter: Payer: Self-pay | Admitting: Family Medicine

## 2015-04-21 ENCOUNTER — Other Ambulatory Visit: Payer: Self-pay | Admitting: Family Medicine

## 2015-04-21 DIAGNOSIS — I10 Essential (primary) hypertension: Secondary | ICD-10-CM

## 2015-04-21 MED ORDER — LISINOPRIL 5 MG PO TABS: 5.0000 mg | ORAL_TABLET | Freq: Every day | ORAL | Status: DC

## 2015-04-22 DIAGNOSIS — Z8659 Personal history of other mental and behavioral disorders: Secondary | ICD-10-CM | POA: Diagnosis not present

## 2015-04-22 DIAGNOSIS — D259 Leiomyoma of uterus, unspecified: Secondary | ICD-10-CM | POA: Diagnosis not present

## 2015-04-22 DIAGNOSIS — N950 Postmenopausal bleeding: Secondary | ICD-10-CM | POA: Diagnosis not present

## 2015-04-22 DIAGNOSIS — N72 Inflammatory disease of cervix uteri: Secondary | ICD-10-CM | POA: Diagnosis not present

## 2015-04-22 DIAGNOSIS — N841 Polyp of cervix uteri: Secondary | ICD-10-CM | POA: Diagnosis not present

## 2015-04-22 DIAGNOSIS — Z01411 Encounter for gynecological examination (general) (routine) with abnormal findings: Secondary | ICD-10-CM | POA: Diagnosis not present

## 2015-04-23 ENCOUNTER — Encounter: Payer: Self-pay | Admitting: Family Medicine

## 2015-04-23 MED ORDER — ESOMEPRAZOLE MAGNESIUM 20 MG PO PACK: 20.0000 mg | PACK | Freq: Every day | ORAL | Status: DC

## 2015-04-23 NOTE — Telephone Encounter (Signed)
Nexium refilled 

## 2015-04-28 ENCOUNTER — Encounter: Payer: Self-pay | Admitting: Family Medicine

## 2015-05-01 DIAGNOSIS — H2512 Age-related nuclear cataract, left eye: Secondary | ICD-10-CM | POA: Diagnosis not present

## 2015-05-01 DIAGNOSIS — H25812 Combined forms of age-related cataract, left eye: Secondary | ICD-10-CM | POA: Diagnosis not present

## 2015-05-08 ENCOUNTER — Encounter: Payer: Self-pay | Admitting: Family Medicine

## 2015-05-08 ENCOUNTER — Encounter (HOSPITAL_COMMUNITY): Payer: Self-pay | Admitting: Emergency Medicine

## 2015-05-08 ENCOUNTER — Emergency Department (INDEPENDENT_AMBULATORY_CARE_PROVIDER_SITE_OTHER)
Admission: EM | Admit: 2015-05-08 | Discharge: 2015-05-08 | Disposition: A | Discharge status: Home / Self Care | Payer: Medicare Other | Source: Home / Self Care | Attending: Family Medicine | Admitting: Family Medicine

## 2015-05-08 DIAGNOSIS — J988 Other specified respiratory disorders: Secondary | ICD-10-CM

## 2015-05-08 DIAGNOSIS — R05 Cough: Secondary | ICD-10-CM

## 2015-05-08 DIAGNOSIS — J45901 Unspecified asthma with (acute) exacerbation: Secondary | ICD-10-CM | POA: Diagnosis not present

## 2015-05-08 DIAGNOSIS — R059 Cough, unspecified: Secondary | ICD-10-CM

## 2015-05-08 MED ORDER — AZITHROMYCIN 250 MG PO TABS: 250.0000 mg | ORAL_TABLET | Freq: Every day | ORAL | Status: DC

## 2015-05-08 MED ORDER — IPRATROPIUM-ALBUTEROL 0.5-2.5 (3) MG/3ML IN SOLN
3.0000 mL | Freq: Once | RESPIRATORY_TRACT | Status: AC
  Administered 2015-05-08: 3 mL via RESPIRATORY_TRACT

## 2015-05-08 MED ORDER — IPRATROPIUM-ALBUTEROL 0.5-2.5 (3) MG/3ML IN SOLN
RESPIRATORY_TRACT | Status: AC
  Filled 2015-05-08: qty 3

## 2015-05-08 MED ORDER — FLUCONAZOLE 150 MG PO TABS: 200.0000 mg | ORAL_TABLET | Freq: Every day | ORAL | Status: AC

## 2015-05-08 NOTE — ED Provider Notes (Signed)
CSN: [id number]     Arrival date & time 05/08/15  1825 History   First MD Initiated Contact with Patient 05/08/15 1832     Chief Complaint  Patient presents with  . URI  . Shortness of Breath   (Consider location/radiation/quality/duration/timing/severity/associated sxs/prior Treatment) HPI Comments: Roberta [name] presents with a 6-7 day history of cough, congestion and most recently wheeze. Cough is non-productive. Out of nebulizer tubing No nasal congestion. Mild fatigue. No fever or chills.   Patient is a 59 y.o. female presenting with URI and shortness of breath. The history is provided by the patient.  URI Presenting symptoms: cough and fatigue   Presenting symptoms: no fever   Associated symptoms: no myalgias   Shortness of Breath Associated symptoms: cough   Associated symptoms: no fever     Past Medical History  Diagnosis Date  . Anemia   . Asthma   . Depression   . Hyperlipidemia   . Hypertension   . ALLERGIC RHINITIS 10/14/2010  . ASTHMA, INTERMITTENT 02/16/2007  . BACK PAIN, CHRONIC 03/25/2009  . CERVICAL RADICULOPATHY 10/16/2009  . DEPRESSIVE DISORDER NOT ELSEWHERE CLASSIFIED 03/25/2009  . Gastric polyp 09/09/2011    Patient has history of a biopsy approximately 3 years ago of having a gastric polyp has endoscopy as well as colonoscopy every 5 years .   Marland Kitchen GERD (gastroesophageal reflux disease) 09/09/2011  . Glaucoma     sees optho every 3 months, drops each night  . Postmenopausal vaginal bleeding 09/24/2014   Past Surgical History  Procedure Laterality Date  . Cervical spine surgery    . Rotator cuff repair Right May 2014  . Cholecystectomy  1980  . Bunionectomy      bilateral and toe correction  . Knee arthroscopy    . Ankle surgery    . Breast cyst incision and drainage      under breast   Family History  Problem Relation Age of Onset  . Heart disease Mother     MI in 80s  . Lymphoma Mother     related to asbestos  . Alcoholism Father   . Cirrhosis  Father     due to alcohol  . Asthma Father    History  Substance Use Topics  . Smoking status: Never Smoker   . Smokeless tobacco: Not on file  . Alcohol Use: No   OB History    No data available     Review of Systems  Constitutional: Positive for fatigue. Negative for fever.  Respiratory: Positive for cough and shortness of breath.   Musculoskeletal: Negative for myalgias.  Psychiatric/Behavioral: Negative.     Allergies  Influenza vaccines; Budesonide; Codeine; and Prednisone  Home Medications   Prior to Admission medications   Medication Sig Start Date End Date Taking? Authorizing Provider  Cholecalciferol (VITAMIN D-3) 1000 UNITS CAPS Take 3 capsules by mouth daily.   Yes Historical Provider, MD  hydrochlorothiazide (HYDRODIURIL) 25 MG tablet Take 1 tablet (25 mg total) by mouth daily. 05/08/14  Yes Marin Olp, MD  lisinopril (PRINIVIL,ZESTRIL) 5 MG tablet Take 1 tablet (5 mg total) by mouth daily. 04/21/15  Yes Mariel Aloe, MD  traMADol (ULTRAM) 50 MG tablet Take 50 mg by mouth every 6 (six) hours as needed.     Yes Historical Provider, MD  albuterol (PROAIR HFA) 108 (90 BASE) MCG/ACT inhaler Inhale 2 puffs into the lungs every 4 (four) hours as needed. 11/14/12   Cletus Gash, MD  aspirin 81 MG tablet  Take 81 mg by mouth daily.    Historical Provider, MD  azithromycin (ZITHROMAX) 250 MG tablet Take 1 tablet (250 mg total) by mouth daily. Take first 2 tablets together, then 1 every day until finished. 05/08/15   Bjorn Pippin, PA-C  cyclobenzaprine (FLEXERIL) 5 MG tablet Take 5 mg by mouth 3 (three) times daily as needed for muscle spasms.    Historical Provider, MD  esomeprazole (NEXIUM) 20 MG packet Take 20 mg by mouth daily before breakfast. 04/23/15   Mariel Aloe, MD  meclizine (ANTIVERT) 12.5 MG tablet Take 1 tablet (12.5 mg total) by mouth 3 (three) times daily as needed for dizziness. 09/24/14   Bernadene Bell, MD  naproxen (NAPROSYN) 500 MG tablet  Take 1 tablet (500 mg total) by mouth 2 (two) times daily with a meal. 01/15/15   Mariel Aloe, MD  ondansetron (ZOFRAN) 4 MG tablet Take 1 tablet (4 mg total) by mouth every 8 (eight) hours as needed for nausea or vomiting. 09/24/14   Bernadene Bell, MD  Travoprost, BAK Free, (TRAVATAN) 0.004 % SOLN ophthalmic solution Place 1 drop into both eyes at bedtime. 10/23/14   Bernadene Bell, MD   BP 144/83 mmHg  Pulse 91  Temp(Src) 98.7 F (37.1 C) (Oral)  Resp 16  SpO2 99% Physical Exam  Constitutional: She is oriented to person, place, and time. She appears well-developed and well-nourished. No distress.  HENT:  Head: Normocephalic and atraumatic.  Mouth/Throat: Oropharynx is clear and moist.  Pulmonary/Chest: Effort normal. She has wheezes.  Respiratory wheeze throughout, mild rhonchi, few mild basilar crackles  Neurological: She is alert and oriented to person, place, and time.  Skin: Skin is warm and dry. She is not diaphoretic.  Psychiatric: Her behavior is normal.  Nursing note and vitals reviewed.   ED Course  Procedures (including critical care time) Labs Review Labs Reviewed - No data to display  Imaging Review No results found.   MDM   1. Respiratory infection   2. Asthma exacerbation   3. Cough    Treat with antibiotic based on duration and exam. Treat with nebulizer q 6 hours while awake over the next 2-3 days. OTC cough medications. Steroid/Prednisone contraindicated. If worsens f/u.     Bjorn Pippin, PA-C 05/08/15 [phone]

## 2015-05-08 NOTE — ED Notes (Signed)
C/o  Sob.  Chest tightness, hx of asthma.  Cough.  Runny nose.  Symptoms present x 1 wk.  No otc treatments tried.  Denies fever, n/v/d.

## 2015-05-08 NOTE — Discharge Instructions (Signed)
Asthma Attack Prevention Although there is no way to prevent asthma from starting, you can take steps to control the disease and reduce its symptoms. Learn about your asthma and how to control it. Take an active role to control your asthma by working with your health care provider to create and follow an asthma action plan. An asthma action plan guides you in:  Taking your medicines properly.  Avoiding things that set off your asthma or make your asthma worse (asthma triggers).  Tracking your level of asthma control.  Responding to worsening asthma.  Seeking emergency care when needed. To track your asthma, keep records of your symptoms, check your peak flow number using a handheld device that shows how well air moves out of your lungs (peak flow meter), and get regular asthma checkups.  WHAT ARE SOME WAYS TO PREVENT AN ASTHMA ATTACK?  Take medicines as directed by your health care provider.  Keep track of your asthma symptoms and level of control.  With your health care provider, write a detailed plan for taking medicines and managing an asthma attack. Then be sure to follow your action plan. Asthma is an ongoing condition that needs regular monitoring and treatment.  Identify and avoid asthma triggers. Many outdoor allergens and irritants (such as pollen, mold, cold air, and air pollution) can trigger asthma attacks. Find out what your asthma triggers are and take steps to avoid them.  Monitor your breathing. Learn to recognize warning signs of an attack, such as coughing, wheezing, or shortness of breath. Your lung function may decrease before you notice any signs or symptoms, so regularly measure and record your peak airflow with a home peak flow meter.  Identify and treat attacks early. If you act quickly, you are less likely to have a severe attack. You will also need less medicine to control your symptoms. When your peak flow measurements decrease and alert you to an upcoming attack,  take your medicine as instructed and immediately stop any activity that may have triggered the attack. If your symptoms do not improve, get medical help.  Pay attention to increasing quick-relief inhaler use. If you find yourself relying on your quick-relief inhaler, your asthma is not under control. See your health care provider about adjusting your treatment. WHAT CAN MAKE MY SYMPTOMS WORSE? A number of common things can set off or make your asthma symptoms worse and cause temporary increased inflammation of your airways. Keep track of your asthma symptoms for several weeks, detailing all the environmental and emotional factors that are linked with your asthma. When you have an asthma attack, go back to your asthma diary to see which factor, or combination of factors, might have contributed to it. Once you know what these factors are, you can take steps to control many of them. If you have allergies and asthma, it is important to take asthma prevention steps at home. Minimizing contact with the substance to which you are allergic will help prevent an asthma attack. Some triggers and ways to avoid these triggers are: Animal Dander:  Some people are allergic to the flakes of skin or dried saliva from animals with fur or feathers.   There is no such thing as a hypoallergenic dog or cat breed. All dogs or cats can cause allergies, even if they don't shed.  Keep these pets out of your home.  If you are not able to keep a pet outdoors, keep the pet out of your bedroom and other sleeping areas at all  times, and keep the door closed.  Remove carpets and furniture covered with cloth from your home. If that is not possible, keep the pet away from fabric-covered furniture and carpets. Dust Mites: Many people with asthma are allergic to dust mites. Dust mites are tiny bugs that are found in every home in mattresses, pillows, carpets, fabric-covered furniture, bedcovers, clothes, stuffed toys, and other  fabric-covered items.   Cover your mattress in a special dust-proof cover.  Cover your pillow in a special dust-proof cover, or wash the pillow each week in hot water. Water must be hotter than 130 F (54.4 C) to kill dust mites. Cold or warm water used with detergent and bleach can also be effective.  Wash the sheets and blankets on your bed each week in hot water.  Try not to sleep or lie on cloth-covered cushions.  Call ahead when traveling and ask for a smoke-free hotel room. Bring your own bedding and pillows in case the hotel only supplies feather pillows and down comforters, which may contain dust mites and cause asthma symptoms.  Remove carpets from your bedroom and those laid on concrete, if you can.  Keep stuffed toys out of the bed, or wash the toys weekly in hot water or cooler water with detergent and bleach. Cockroaches: Many people with asthma are allergic to the droppings and remains of cockroaches.   Keep food and garbage in closed containers. Never leave food out.  Use poison baits, traps, powders, gels, or paste (for example, boric acid).  If a spray is used to kill cockroaches, stay out of the room until the odor goes away. Indoor Mold:  Fix leaky faucets, pipes, or other sources of water that have mold around them.  Clean floors and moldy surfaces with a fungicide or diluted bleach.  Avoid using humidifiers, vaporizers, or swamp coolers. These can spread molds through the air. Pollen and Outdoor Mold:  When pollen or mold spore counts are high, try to keep your windows closed.  Stay indoors with windows closed from late morning to afternoon. Pollen and some mold spore counts are highest at that time.  Ask your health care provider whether you need to take anti-inflammatory medicine or increase your dose of the medicine before your allergy season starts. Other Irritants to Avoid:  Tobacco smoke is an irritant. If you smoke, ask your health care provider how  you can quit. Ask family members to quit smoking, too. Do not allow smoking in your home or car.  If possible, do not use a wood-burning stove, kerosene heater, or fireplace. Minimize exposure to all sources of smoke, including incense, candles, fires, and fireworks.  Try to stay away from strong odors and sprays, such as perfume, talcum powder, hair spray, and paints.  Decrease humidity in your home and use an indoor air cleaning device. Reduce indoor humidity to below 60%. Dehumidifiers or central air conditioners can do this.  Decrease house dust exposure by changing furnace and air cooler filters frequently.  Try to have someone else vacuum for you once or twice a week. Stay out of rooms while they are being vacuumed and for a short while afterward.  If you vacuum, use a dust mask from a hardware store, a double-layered or microfilter vacuum cleaner bag, or a vacuum cleaner with a HEPA filter.  Sulfites in foods and beverages can be irritants. Do not drink beer or wine or eat dried fruit, processed potatoes, or shrimp if they cause asthma symptoms.  Cold  air can trigger an asthma attack. Cover your nose and mouth with a scarf on cold or windy days.  Several health conditions can make asthma more difficult to manage, including a runny nose, sinus infections, reflux disease, psychological stress, and sleep apnea. Work with your health care provider to manage these conditions.  Avoid close contact with people who have a respiratory infection such as a cold or the flu, since your asthma symptoms may get worse if you catch the infection. Wash your hands thoroughly after touching items that may have been handled by people with a respiratory infection.  Get a flu shot every year to protect against the flu virus, which often makes asthma worse for days or weeks. Also get a pneumonia shot if you have not previously had one. Unlike the flu shot, the pneumonia shot does not need to be given  yearly. Medicines:  Talk to your health care provider about whether it is safe for you to take aspirin or non-steroidal anti-inflammatory medicines (NSAIDs). In a small number of people with asthma, aspirin and NSAIDs can cause asthma attacks. These medicines must be avoided by people who have known aspirin-sensitive asthma. It is important that people with aspirin-sensitive asthma read labels of all over-the-counter medicines used to treat pain, colds, coughs, and fever.  Beta-blockers and ACE inhibitors are other medicines you should discuss with your health care provider. HOW CAN I FIND OUT WHAT I AM ALLERGIC TO? Ask your asthma health care provider about allergy skin testing or blood testing (the RAST test) to identify the allergens to which you are sensitive. If you are found to have allergies, the most important thing to do is to try to avoid exposure to any allergens that you are sensitive to as much as possible. Other treatments for allergies, such as medicines and allergy shots (immunotherapy) are available.  CAN I EXERCISE? Follow your health care provider's advice regarding asthma treatment before exercising. It is important to maintain a regular exercise program, but vigorous exercise or exercise in cold, humid, or dry environments can cause asthma attacks, especially for those people who have exercise-induced asthma. Document Released: 11/24/2009 Document Revised: 12/11/2013 Document Reviewed: 06/13/2013 [facility] Patient Information 2015 [place], [place]. This information is not intended to replace advice given to you by your health care provider. Make sure you discuss any questions you have with your health care provider.  Cough, Adult  A cough is a reflex. It helps you clear your throat and airways. A cough can help heal your body. A cough can last 2 or 3 weeks (acute) or may last more than 8 weeks (chronic). Some common causes of a cough can include an infection, allergy, or a  cold. HOME CARE  Only take medicine as told by your doctor.  If given, take your medicines (antibiotics) as told. Finish them even if you start to feel better.  Use a cold steam vaporizer or humidifier in your home. This can help loosen thick spit (secretions).  Sleep so you are almost sitting up (semi-upright). Use pillows to do this. This helps reduce coughing.  Rest as needed.  Stop smoking if you smoke. GET HELP RIGHT AWAY IF:  You have yellowish-white fluid (pus) in your thick spit.  Your cough gets worse.  Your medicine does not reduce coughing, and you are losing sleep.  You cough up blood.  You have trouble breathing.  Your pain gets worse and medicine does not help.  You have a fever. MAKE SURE YOU:  Understand these instructions.  Will watch your condition.  Will get help right away if you are not doing well or get worse. Document Released: 08/19/2011 Document Revised: 04/22/2014 Document Reviewed: 08/19/2011 [facility] Patient Information 2015 [place], [place]. This information is not intended to replace advice given to you by your health care provider. Make sure you discuss any questions you have with your health care provider.    Your asthma is flared up and you probable have a respiratory infection as well. Will treat with Nebs every 6 hours while awake. Use Delsym over the counter for cough. Add Benadryl 25mg  every 4-6 hours if needed. Take all of your antibiotics.

## 2015-05-09 ENCOUNTER — Telehealth: Payer: Self-pay | Admitting: Family Medicine

## 2015-05-09 ENCOUNTER — Encounter: Payer: Self-pay | Admitting: Family Medicine

## 2015-05-09 NOTE — Telephone Encounter (Signed)
Pt called and would like refills on her asthma solution, tubing, and mouth piece

## 2015-05-10 ENCOUNTER — Emergency Department (INDEPENDENT_AMBULATORY_CARE_PROVIDER_SITE_OTHER): Payer: Medicare Other

## 2015-05-10 ENCOUNTER — Emergency Department (INDEPENDENT_AMBULATORY_CARE_PROVIDER_SITE_OTHER)
Admission: EM | Admit: 2015-05-10 | Discharge: 2015-05-10 | Disposition: A | Discharge status: Home / Self Care | Payer: Medicare Other | Source: Home / Self Care

## 2015-05-10 ENCOUNTER — Encounter (HOSPITAL_COMMUNITY): Payer: Self-pay | Admitting: Emergency Medicine

## 2015-05-10 ENCOUNTER — Encounter: Payer: Self-pay | Admitting: Family Medicine

## 2015-05-10 DIAGNOSIS — J45901 Unspecified asthma with (acute) exacerbation: Secondary | ICD-10-CM

## 2015-05-10 DIAGNOSIS — R0602 Shortness of breath: Secondary | ICD-10-CM | POA: Diagnosis not present

## 2015-05-10 MED ORDER — ALBUTEROL SULFATE (2.5 MG/3ML) 0.083% IN NEBU
INHALATION_SOLUTION | RESPIRATORY_TRACT | Status: AC
  Filled 2015-05-10: qty 6

## 2015-05-10 MED ORDER — IPRATROPIUM BROMIDE 0.02 % IN SOLN
RESPIRATORY_TRACT | Status: AC
  Filled 2015-05-10: qty 2.5

## 2015-05-10 MED ORDER — ALIGN 4 MG PO CAPS: 1.0000 | ORAL_CAPSULE | Freq: Every day | ORAL | Status: DC

## 2015-05-10 MED ORDER — IPRATROPIUM BROMIDE 0.02 % IN SOLN
0.5000 mg | Freq: Once | RESPIRATORY_TRACT | Status: AC
  Administered 2015-05-10: 0.5 mg via RESPIRATORY_TRACT

## 2015-05-10 MED ORDER — PREDNISONE 20 MG PO TABS: 20.0000 mg | ORAL_TABLET | Freq: Every day | ORAL | Status: DC

## 2015-05-10 MED ORDER — ALBUTEROL SULFATE (2.5 MG/3ML) 0.083% IN NEBU
5.0000 mg | INHALATION_SOLUTION | Freq: Once | RESPIRATORY_TRACT | Status: AC
  Administered 2015-05-10: 5 mg via RESPIRATORY_TRACT

## 2015-05-10 MED ORDER — ALBUTEROL SULFATE (2.5 MG/3ML) 0.083% IN NEBU: 2.5000 mg | INHALATION_SOLUTION | Freq: Four times a day (QID) | RESPIRATORY_TRACT | Status: DC | PRN

## 2015-05-10 NOTE — ED Notes (Signed)
Pt comes in with c/o unrelieved sob and chest tightness with lying down at night s/p ATB's and using albuterol inhaler. Pt was seen here 05/08/15, given duo-neb treatment with prescribed ATB's Pt has f/u appt with PCP on Tuesday

## 2015-05-10 NOTE — Discharge Instructions (Signed)

## 2015-05-10 NOTE — ED Provider Notes (Signed)
CSN: [id number]     Arrival date & time 05/10/15  1545 History   None    Chief Complaint  Patient presents with  . Asthma   (Consider location/radiation/quality/duration/timing/severity/associated sxs/prior Treatment)  HPI   Patient is a 59 year old female presenting today with complaints of shortness of breath lasting approximately one week. Patient states she has been exposed to her grandson who was sick approximately week ago and since then has been experiencing increased shortness of breath, particularly with exertion.  The patient routinely uses a hand-held nebulizer at home for treatment of her asthma symptoms. Patient states she is out of her medication has been waiting on a refill request for the past day or so. In addition she reports nighttime cough and increased difficulty breathing today after church. The denies any sort of chest pain or chest pressure. Denies swelling of legs or feet. Denies, nausea, diaphoresis or weakness. She was seen a few days ago for similar symptoms and prescribed an antibiotic for a possible upper respiratory infection.  Past Medical History  Diagnosis Date  . Anemia   . Asthma   . Depression   . Hyperlipidemia   . Hypertension   . ALLERGIC RHINITIS 10/14/2010  . ASTHMA, INTERMITTENT 02/16/2007  . BACK PAIN, CHRONIC 03/25/2009  . CERVICAL RADICULOPATHY 10/16/2009  . DEPRESSIVE DISORDER NOT ELSEWHERE CLASSIFIED 03/25/2009  . Gastric polyp 09/09/2011    Patient has history of a biopsy approximately 3 years ago of having a gastric polyp has endoscopy as well as colonoscopy every 5 years .   Marland Kitchen GERD (gastroesophageal reflux disease) 09/09/2011  . Glaucoma     sees optho every 3 months, drops each night  . Postmenopausal vaginal bleeding 09/24/2014   Past Surgical History  Procedure Laterality Date  . Cervical spine surgery    . Rotator cuff repair Right May 2014  . Cholecystectomy  1980  . Bunionectomy      bilateral and toe correction  . Knee  arthroscopy    . Ankle surgery    . Breast cyst incision and drainage      under breast   Family History  Problem Relation Age of Onset  . Heart disease Mother     MI in 66s  . Lymphoma Mother     related to asbestos  . Alcoholism Father   . Cirrhosis Father     due to alcohol  . Asthma Father    History  Substance Use Topics  . Smoking status: Never Smoker   . Smokeless tobacco: Not on file  . Alcohol Use: No   OB History    No data available     Review of Systems  Constitutional: Positive for fatigue. Negative for fever.  HENT: Positive for congestion. Negative for drooling, sinus pressure, sore throat, trouble swallowing and voice change.   Eyes: Negative.   Respiratory: Positive for shortness of breath and wheezing. Negative for cough, choking, chest tightness and stridor.   Cardiovascular: Negative.  Negative for chest pain, palpitations and leg swelling.  Gastrointestinal: Negative.  Negative for nausea and vomiting.  Endocrine: Negative.   Genitourinary: Negative.   Musculoskeletal: Negative.  Negative for myalgias and neck stiffness.  Skin: Negative.  Negative for color change, pallor and rash.  Allergic/Immunologic: Negative.   Neurological: Negative.  Negative for dizziness and weakness.  Hematological: Negative.   Psychiatric/Behavioral: Negative.        Allergies  Influenza vaccines; Budesonide; Codeine; and Prednisone  Home Medications   Prior to Admission medications  Medication Sig Start Date End Date Taking? Authorizing Provider  albuterol (PROAIR HFA) 108 (90 BASE) MCG/ACT inhaler Inhale 2 puffs into the lungs every 4 (four) hours as needed. 11/14/12   Cletus Gash, MD  albuterol (PROVENTIL) (2.5 MG/3ML) 0.083% nebulizer solution Take 3 mLs (2.5 mg total) by nebulization every 6 (six) hours as needed for wheezing or shortness of breath. 05/10/15   Nehemiah Settle, NP  aspirin 81 MG tablet Take 81 mg by mouth daily.    Historical Provider,  MD  azithromycin (ZITHROMAX) 250 MG tablet Take 1 tablet (250 mg total) by mouth daily. Take first 2 tablets together, then 1 every day until finished. 05/08/15   Bjorn Pippin, PA-C  Cholecalciferol (VITAMIN D-3) 1000 UNITS CAPS Take 3 capsules by mouth daily.    Historical Provider, MD  cyclobenzaprine (FLEXERIL) 5 MG tablet Take 5 mg by mouth 3 (three) times daily as needed for muscle spasms.    Historical Provider, MD  esomeprazole (NEXIUM) 20 MG packet Take 20 mg by mouth daily before breakfast. 04/23/15   Mariel Aloe, MD  fluconazole (DIFLUCAN) 150 MG tablet Take 1.5 tablets (225 mg total) by mouth daily. 05/08/15 05/15/15  Bjorn Pippin, PA-C  hydrochlorothiazide (HYDRODIURIL) 25 MG tablet Take 1 tablet (25 mg total) by mouth daily. 05/08/14   Marin Olp, MD  lisinopril (PRINIVIL,ZESTRIL) 5 MG tablet Take 1 tablet (5 mg total) by mouth daily. 04/21/15   Mariel Aloe, MD  meclizine (ANTIVERT) 12.5 MG tablet Take 1 tablet (12.5 mg total) by mouth 3 (three) times daily as needed for dizziness. 09/24/14   Bernadene Bell, MD  naproxen (NAPROSYN) 500 MG tablet Take 1 tablet (500 mg total) by mouth 2 (two) times daily with a meal. 01/15/15   Mariel Aloe, MD  ondansetron (ZOFRAN) 4 MG tablet Take 1 tablet (4 mg total) by mouth every 8 (eight) hours as needed for nausea or vomiting. 09/24/14   Bernadene Bell, MD  predniSONE (DELTASONE) 20 MG tablet Take 1 tablet (20 mg total) by mouth daily. 05/10/15   Nehemiah Settle, NP  Probiotic Product (ALIGN) 4 MG CAPS Take 1 capsule by mouth daily. 05/10/15   Nehemiah Settle, NP  traMADol (ULTRAM) 50 MG tablet Take 50 mg by mouth every 6 (six) hours as needed.      Historical Provider, MD  Travoprost, BAK Free, (TRAVATAN) 0.004 % SOLN ophthalmic solution Place 1 drop into both eyes at bedtime. 10/23/14   Bernadene Bell, MD   BP 117/79 mmHg  Pulse 74  Temp(Src) 97.4 F (36.3 C) (Oral)  Resp 18  SpO2 98%   Physical Exam  Constitutional: She is  oriented to person, place, and time. She appears well-developed and well-nourished. No distress.  HENT:  Head: Normocephalic and atraumatic.  Eyes: Pupils are equal, round, and reactive to light.  Neck: Normal range of motion. Neck supple.  Cardiovascular: Normal rate, regular rhythm, normal heart sounds and intact distal pulses.  Exam reveals no gallop and no friction rub.   No murmur heard. Negative for pedal edema.  Pulmonary/Chest: No respiratory distress. She has wheezes. She has rales. She exhibits no tenderness.  Scattered rhonchi noted throughout. Patient has expiratory wheezing in all fields. There is increased work of breathing and patient unable to speak in full sentences. Mild crackles noted in bilateral bases, particularly in the right lower lobe. Negative for egophony.  Neurological: She is alert and oriented to person, place, and  time.  Skin: Skin is warm and dry. She is not diaphoretic.  Nursing note and vitals reviewed.  Evaluation following hand-held nebulizer treatment: there are still scattered rhonchi and mild expiratory wheezes occasionally with expiration. CXR to r/o CAP.  ED Course  Procedures (including critical care time) Labs Review Labs Reviewed - No data to display  Imaging Review Dg Chest 2 View  05/10/2015   CLINICAL DATA:  Acute onset of shortness of breath and congestion. Initial encounter.  EXAM: CHEST  2 VIEW  COMPARISON:  Chest radiograph performed 09/12/2009  FINDINGS: The lungs are well-aerated. Pulmonary vascularity is at the upper limits of normal. There is no evidence of focal opacification, pleural effusion or pneumothorax.  The cardiomediastinal silhouette is within normal limits. No acute osseous abnormalities are seen. Cervical spinal fusion hardware is partially imaged. Clips are noted within the right upper quadrant, reflecting prior cholecystectomy.  IMPRESSION: No acute cardiopulmonary process seen.   Electronically Signed   By: Garald Balding  M.D.   On: 05/10/2015 18:12     MDM   1. Asthma exacerbation    Meds ordered this encounter  Medications  . albuterol (PROVENTIL) (2.5 MG/3ML) 0.083% nebulizer solution 5 mg    Sig:   . ipratropium (ATROVENT) nebulizer solution 0.5 mg    Sig:   . albuterol (PROVENTIL) (2.5 MG/3ML) 0.083% nebulizer solution    Sig: Take 3 mLs (2.5 mg total) by nebulization every 6 (six) hours as needed for wheezing or shortness of breath.    Dispense:  75 mL    Refill:  1  . predniSONE (DELTASONE) 20 MG tablet    Sig: Take 1 tablet (20 mg total) by mouth daily.    Dispense:  15 tablet    Refill:  0  . Probiotic Product (ALIGN) 4 MG CAPS    Sig: Take 1 capsule by mouth daily.    Dispense:  30 capsule    Refill:  2   Patient states she is able to take prednisone in "low doses" only.  The patient states she has spontaneous vaginal bleeding that has been evaluated for uterine cancer following steroid use, but no allergic symptoms related to shortness of breath or hives. Patient states she feels this is related to steroid treatment but has been told with acute asthma exacerbation she can be given "low doses only". Patient started on 20 mg of prednisone daily and advised to follow up with family practice providers this week. The patient verbalizes understanding and agrees to plan of care.       Nehemiah Settle, NP 05/10/15 1849

## 2015-05-11 ENCOUNTER — Encounter: Payer: Self-pay | Admitting: Family Medicine

## 2015-05-12 ENCOUNTER — Other Ambulatory Visit: Payer: Self-pay | Admitting: Obstetrics and Gynecology

## 2015-05-12 DIAGNOSIS — N841 Polyp of cervix uteri: Secondary | ICD-10-CM | POA: Diagnosis not present

## 2015-05-12 DIAGNOSIS — N950 Postmenopausal bleeding: Secondary | ICD-10-CM | POA: Diagnosis not present

## 2015-05-12 DIAGNOSIS — N8500 Endometrial hyperplasia, unspecified: Secondary | ICD-10-CM | POA: Diagnosis not present

## 2015-05-13 ENCOUNTER — Ambulatory Visit (INDEPENDENT_AMBULATORY_CARE_PROVIDER_SITE_OTHER): Payer: Medicare Other | Admitting: Family Medicine

## 2015-05-13 ENCOUNTER — Encounter: Payer: Self-pay | Admitting: Family Medicine

## 2015-05-13 VITALS — BP 153/86 | HR 75 | Ht 66.0 in | Wt 258.0 lb

## 2015-05-13 DIAGNOSIS — J4531 Mild persistent asthma with (acute) exacerbation: Secondary | ICD-10-CM | POA: Diagnosis not present

## 2015-05-13 MED ORDER — NEBULIZER/TUBING/MOUTHPIECE KIT: PACK | Status: DC

## 2015-05-13 NOTE — Patient Instructions (Addendum)
It was nice to see you today.  Please schedule an appointment with our pharmacist for spirometry next week.  Continue your medications as prescribed.  Follow-up if you fail to improve or worsen. Additionally, please be sure to follow closely with your primary.

## 2015-05-13 NOTE — Telephone Encounter (Signed)
Sent refill for mouth piece and tubing. Patient recently received prescription for albuterol solution.

## 2015-05-14 DIAGNOSIS — J45901 Unspecified asthma with (acute) exacerbation: Secondary | ICD-10-CM | POA: Insufficient documentation

## 2015-05-14 NOTE — Assessment & Plan Note (Addendum)
Recent urgent care visits reviewed. Patient continues to experience symptoms and is diffusely wheezing on exam. I advised her to increase her prednisone to 60 mg daily next 4 days (this would complete her therapy).  I gave her prescription for tubing for nebulizer and also gave her a peak flow meter today. I am sending her to our pharmacist Dr. Valentina Lucks for spirometry for evaluation after her exacerbation is resolved. Her asthma was very late onset in life which is uncharacteristic. I'm concerned about other underlying pathology.

## 2015-05-14 NOTE — Telephone Encounter (Signed)
LMOVM for pt to return call .Ester Hilley Dawn  

## 2015-05-14 NOTE — Progress Notes (Signed)
   Subjective:    Patient ID: Roberta [name], female    DOB: 1956-09-25, 59 y.o.   MRN: [id number]  HPI 59 year old female with a history of asthma presents for follow-up after suffering a recent asthma exacerbation.  1) Asthma exacerbation  Patient was seen at urgent care on 5/19 for evaluation of cough, congestion, and wheezing.  She was diagnosed with a respiratory infection and an acute asthma exacerbation and was treated with azithromycin. She was advised to use nebulizer albuterol treatments at home.  Patient continued to have symptoms and returned to urgent care on 5/21.  At that time she was started on prednisone.  Of note, patient has a history of vaginal bleeding while on prednisone. This was the main reason that was not prescribed at the first evaluation on 5/19.  Patient is today for follow-up.  She states that she is doing much better but continues to have some wheezing.  She reports improvement with albuterol.   She endorses compliance with her medication including prednisone.   Patient requesting tubing for nebulizer, peak flow meter today.   No recent fever, chills.   Social Hx - Nonsmoker.   Review of Systems  Constitutional: Negative for fever and chills.  Respiratory: Positive for cough, shortness of breath and wheezing.   Cardiovascular: Negative for chest pain.      Objective:   Physical Exam Filed Vitals:   05/13/15 1520  BP: 153/86  Pulse: 75  Vital signs reviewed.  Exam: General: Obese female in no acute distress. Cardiovascular: RRR. No murmurs, rubs, or gallops. Respiratory: Diffuse expiratory wheezing noted. No increased work of breathing.     Assessment & Plan:  See Problem List

## 2015-05-22 ENCOUNTER — Ambulatory Visit (INDEPENDENT_AMBULATORY_CARE_PROVIDER_SITE_OTHER): Payer: Medicare Other | Admitting: Pharmacist

## 2015-05-22 VITALS — Ht 66.0 in | Wt 266.0 lb

## 2015-05-22 DIAGNOSIS — J4520 Mild intermittent asthma, uncomplicated: Secondary | ICD-10-CM

## 2015-05-22 NOTE — Progress Notes (Signed)
S:    Patient arrives with dark glasses and recent cataract surgery. Appears to be in no apparent distress.  Presents for lung function evaluation.  Patient reports breathing has been good today.   She believes she is nearly completely recovered from her exacerbation.  Patient reports last dose of asthma medications was 2 days ago.  O: See "scanned report" or Documentation Flowsheet (discrete results - PFTs) for  Spirometry results. Patient provided good effort while attempting spirometry.   A/P: Spirometry evaluation without brochodilator reveals normal lung function.  Patient has been experiencing wheezing, coughing, and SOB following asthma exacerbation and took a 4 day course of prednisone on 05/25 along with albuterol inhaler and nebulizer. no change to treatment plan at this time.  Reviewed results of pulmonary function tests. Patient verbalized understanding of results and education.  Written pt instructions provided.  F/U Clinic visit with Dr. Lonny Prude in June. Patient to schedule today.  Total time in face to face counseling 20 minutes.  Patient seen with Nilsa Nutting, PharmD Candidate and Elenor Quinones, PharmD Resident.

## 2015-05-22 NOTE — Patient Instructions (Addendum)
Multivitamin should have at least 800 IU vitamin D. If it doesn't, may need to restart vitamin D-3 supplement.   Lung function tests show normal lung function.

## 2015-05-23 ENCOUNTER — Encounter: Payer: Self-pay | Admitting: Pharmacist

## 2015-05-23 NOTE — Progress Notes (Signed)
Patient ID: Roberta [name], female   DOB: [birth year], 59 y.o.   MRN: [id number] Reviewed: Agree with Dr. Graylin Shiver documentation and management.

## 2015-05-23 NOTE — Assessment & Plan Note (Signed)
Spirometry evaluation without brochodilator reveals normal lung function.  Patient has been experiencing wheezing, coughing, and SOB following asthma exacerbation and took a 4 day course of prednisone on 05/25 along with albuterol inhaler and nebulizer. no change to treatment plan at this time.  Reviewed results of pulmonary function tests. Patient verbalized understanding of results and education.

## 2015-05-26 ENCOUNTER — Telehealth: Payer: Self-pay | Admitting: Family Medicine

## 2015-05-26 ENCOUNTER — Encounter: Payer: Self-pay | Admitting: Family Medicine

## 2015-05-26 NOTE — Telephone Encounter (Signed)
Pt called and needs a refill on her nexium. Please make sure that they are CAPSULES. jw

## 2015-05-27 MED ORDER — ESOMEPRAZOLE MAGNESIUM 20 MG PO CPDR: 20.0000 mg | DELAYED_RELEASE_CAPSULE | Freq: Every day | ORAL | Status: DC

## 2015-05-28 NOTE — Telephone Encounter (Signed)
Pt called because [facility] didn't get the electronic request yesterday morning for her Nexium. Can we call this is. Roberta [name]

## 2015-05-28 NOTE — Telephone Encounter (Signed)
Pt informed that [facility] received her Rx for Nexium.  Derl Barrow, RN

## 2015-06-04 ENCOUNTER — Encounter: Payer: Self-pay | Admitting: Family Medicine

## 2015-06-16 ENCOUNTER — Other Ambulatory Visit: Payer: Self-pay | Admitting: Family Medicine

## 2015-06-24 ENCOUNTER — Ambulatory Visit: Payer: Medicare Other | Admitting: Family Medicine

## 2015-06-25 ENCOUNTER — Encounter: Payer: Self-pay | Admitting: Family Medicine

## 2015-06-25 ENCOUNTER — Ambulatory Visit (INDEPENDENT_AMBULATORY_CARE_PROVIDER_SITE_OTHER): Payer: Medicare Other | Admitting: Family Medicine

## 2015-06-25 VITALS — BP 120/86 | HR 88 | Ht 67.0 in | Wt 260.0 lb

## 2015-06-25 DIAGNOSIS — D259 Leiomyoma of uterus, unspecified: Secondary | ICD-10-CM | POA: Diagnosis not present

## 2015-06-25 DIAGNOSIS — J4520 Mild intermittent asthma, uncomplicated: Secondary | ICD-10-CM | POA: Diagnosis not present

## 2015-06-25 DIAGNOSIS — F32A Depression, unspecified: Secondary | ICD-10-CM

## 2015-06-25 DIAGNOSIS — F329 Major depressive disorder, single episode, unspecified: Secondary | ICD-10-CM | POA: Diagnosis not present

## 2015-06-25 DIAGNOSIS — I10 Essential (primary) hypertension: Secondary | ICD-10-CM | POA: Diagnosis not present

## 2015-06-25 DIAGNOSIS — K219 Gastro-esophageal reflux disease without esophagitis: Secondary | ICD-10-CM

## 2015-06-25 MED ORDER — RANITIDINE HCL 150 MG PO TABS: 150.0000 mg | ORAL_TABLET | Freq: Two times a day (BID) | ORAL | Status: DC

## 2015-06-25 NOTE — Assessment & Plan Note (Signed)
Controlled.  Continue lisinopril 5mg  daily  Continue HCTZ 25mg  daily

## 2015-06-25 NOTE — Assessment & Plan Note (Signed)
Patient interested in possibly coming off Nexium. Discussed diet modification and switch to ranitidine. Patient appears okay with trying  Ranitidine 150mg  BID  GERD food handout given

## 2015-06-25 NOTE — Assessment & Plan Note (Signed)
Controlled currently and without having to use albuterol. No controller. Asymptomatic and low risk  Continue Albuterol PRN

## 2015-06-25 NOTE — Assessment & Plan Note (Signed)
Appears to be doing well without medication management or therapy. Home situation has improved and, with the addition of cataract surgery allowing her to exercise, her overall happiness has increased. No SI.  Continue no medication management

## 2015-06-25 NOTE — Progress Notes (Signed)
    Subjective    Roberta [name] is a 59 y.o. female that presents for a follow-up visit for chronic issues.   1. Asthma: Patient not using albuterol regularly. Last use was once last month. She has no nighttime coughing. No wheezing.   2. GERD: Chronic. Controlled with Nexium.   3. Hypertension: Adherent with lisinopril 5mg  and Hctz 25mg . No chest pain or shortness of breath.  4. Depression: Feeling better. Home life is better now that her daughter has a job and is taking care of her responsibilities. She is also going to the gym which gives her lot of joy.  History  Substance Use Topics  . Smoking status: Never Smoker   . Smokeless tobacco: Not on file  . Alcohol Use: No    Allergies  Allergen Reactions  . Influenza Vaccines Anaphylaxis    Throat swelling reported after flu shot in the past (cannot verify with records but would not give)  . Budesonide Other (See Comments)    Throat infection per patient  . Prednisone Other (See Comments)    increased vaginal bleeding, able to tolerate low dose (40 mg)  . Codeine Nausea And Vomiting  . Morphine And Related Nausea And Vomiting    No orders of the defined types were placed in this encounter.    ROS  Per HPI   Objective  BP 120/86 mmHg  Pulse 88  Ht 5\' 7"  (1.702 m)  Wt 260 lb (117.935 kg)  BMI 40.71 kg/m2  General: Well appearing, no distress Neuro: Alert, oriented Psych: slightly full affect, does not keep good eye contact, dresses appropriately  Assessment and Plan   Please refer to problem based charting of assessment and plan

## 2015-06-25 NOTE — Patient Instructions (Addendum)
Thank you for coming to see me today. It was a pleasure. Today we talked about:   Asthma: No changes to your medications  Reflux: We will try switching from Nexium to Zantac  Hypertension: Blood pressure is great today. No changes to medications  Depression: So glad you're doing well. We will just continue to watch your progress  Please make an appointment to see me in 6 months for follow-up.  If you have any questions or concerns, please do not hesitate to call the office at [phone].  Sincerely,  Cordelia Poche, MD   Ranitidine tablets or capsules What is this medicine? RANITIDINE (ra NYE te deen) is a type of antihistamine that blocks the release of stomach acid. It is used to treat stomach or intestinal ulcers. It can relieve ulcer pain and discomfort, and the heartburn from acid reflux. This medicine may be used for other purposes; ask your health care provider or pharmacist if you have questions. COMMON BRAND NAME(S): Acid Reducer, Taladine, Zantac, Zantac 150, Zantac 75 What should I tell my health care provider before I take this medicine? They need to know if you have any of these conditions: -kidney disease -liver disease -porphyria -an unusual or allergic reaction to ranitidine, other medicines, foods, dyes, or preservatives -pregnant or trying to get pregnant -breast-feeding How should I use this medicine? Take this medicine by mouth with a glass of water. Follow the directions on the prescription label. If you only take this medicine once a day, take it at bedtime. Take your medicine at regular intervals. Do not take your medicine more often than directed. Do not stop taking except on your doctor's advice. Talk to your pediatrician regarding the use of this medicine in children. Special care may be needed. Overdosage: If you think you have taken too much of this medicine contact a poison control center or emergency room at once. NOTE: This medicine is only for  you. Do not share this medicine with others. What if I miss a dose? If you miss a dose, take it as soon as you can. If it is almost time for your next dose, take only that dose. Do not take double or extra doses. What may interact with this medicine? -atazanavir -delavirdine -gefitinib -glipizide -ketoconazole -midazolam -procainamide -propantheline -triazolam -warfarin This list may not describe all possible interactions. Give your health care provider a list of all the medicines, herbs, non-prescription drugs, or dietary supplements you use. Also tell them if you smoke, drink alcohol, or use illegal drugs. Some items may interact with your medicine. What should I watch for while using this medicine? Tell your doctor or health care professional if your condition does not start to get better or gets worse. You may need to take this medicine for several days as prescribed before your symptoms get better. Finish the full course of tablets prescribed, even if you feel better. Do not smoke cigarettes or drink alcohol. These increase irritation in your stomach and can lengthen the time it will take for ulcers to heal. Cigarettes and alcohol can also make acid reflux or heartburn worse. If you get black, tarry stools or vomit up what looks like coffee grounds, call your doctor or health care professional at once. You may have a bleeding ulcer. What side effects may I notice from receiving this medicine? Side effects that you should report to your doctor or health care professional as soon as possible: -agitation, nervousness, depression, hallucinations -allergic reactions like skin rash, itching or  hives, swelling of the face, lips, or tongue -breast enlargement in both males and females -breathing problems -redness, blistering, peeling or loosening of the skin, including inside the mouth -unusual bleeding or bruising -unusually weak or tired -vomiting -yellowing of the skin or eyes Side  effects that usually do not require medical attention (report to your doctor or health care professional if they continue or are bothersome): -constipation or diarrhea -dizziness -headache -nausea This list may not describe all possible side effects. Call your doctor for medical advice about side effects. You may report side effects to FDA at 1-800-FDA-1088. Where should I keep my medicine? Keep out of the reach of children. Store at room temperature between 15 and 30 degrees C (59 and 86 degrees F). Protect from light and moisture. Keep container tightly closed. Throw away any unused medicine after the expiration date. NOTE: This sheet is a summary. It may not cover all possible information. If you have questions about this medicine, talk to your doctor, pharmacist, or health care provider.  2015, Elsevier/Gold Standard. (2013-03-28 14:50:34)   Food Choices for Gastroesophageal Reflux Disease When you have gastroesophageal reflux disease (GERD), the foods you eat and your eating habits are very important. Choosing the right foods can help ease the discomfort of GERD. WHAT GENERAL GUIDELINES DO I NEED TO FOLLOW?  Choose fruits, vegetables, whole grains, low-fat dairy products, and low-fat meat, fish, and poultry.  Limit fats such as oils, salad dressings, butter, nuts, and avocado.  Keep a food diary to identify foods that cause symptoms.  Avoid foods that cause reflux. These may be different for different people.  Eat frequent small meals instead of three large meals each day.  Eat your meals slowly, in a relaxed setting.  Limit fried foods.  Cook foods using methods other than frying.  Avoid drinking alcohol.  Avoid drinking large amounts of liquids with your meals.  Avoid bending over or lying down until 2-3 hours after eating. WHAT FOODS ARE NOT RECOMMENDED? The following are some foods and drinks that may worsen your symptoms: Vegetables Tomatoes. Tomato juice. Tomato  and spaghetti sauce. Chili peppers. Onion and garlic. Horseradish. Fruits Oranges, grapefruit, and lemon (fruit and juice). Meats High-fat meats, fish, and poultry. This includes hot dogs, ribs, ham, sausage, salami, and bacon. Dairy Whole milk and chocolate milk. Sour cream. Cream. Butter. Ice cream. Cream cheese.  Beverages Coffee and tea, with or without caffeine. Carbonated beverages or energy drinks. Condiments Hot sauce. Barbecue sauce.  Sweets/Desserts Chocolate and cocoa. Donuts. Peppermint and spearmint. Fats and Oils High-fat foods, including [place] fries and potato chips. Other Vinegar. Strong spices, such as black pepper, white pepper, red pepper, cayenne, curry powder, cloves, ginger, and chili powder. The items listed above may not be a complete list of foods and beverages to avoid. Contact your dietitian for more information. Document Released: 12/06/2005 Document Revised: 12/11/2013 Document Reviewed: 10/10/2013 [facility] Patient Information 2015 [place], [place]. This information is not intended to replace advice given to you by your health care provider. Make sure you discuss any questions you have with your health care provider.

## 2015-07-02 ENCOUNTER — Ambulatory Visit (INDEPENDENT_AMBULATORY_CARE_PROVIDER_SITE_OTHER): Payer: Medicare Other | Admitting: Obstetrics

## 2015-07-02 ENCOUNTER — Encounter: Payer: Self-pay | Admitting: Certified Nurse Midwife

## 2015-07-02 ENCOUNTER — Ambulatory Visit: Payer: Self-pay | Admitting: Obstetrics

## 2015-07-02 ENCOUNTER — Ambulatory Visit: Payer: Self-pay | Admitting: Certified Nurse Midwife

## 2015-07-02 VITALS — BP 111/82 | HR 76 | Temp 98.5°F | Ht 66.5 in | Wt 256.0 lb

## 2015-07-02 DIAGNOSIS — N8500 Endometrial hyperplasia, unspecified: Secondary | ICD-10-CM

## 2015-07-02 NOTE — Progress Notes (Signed)
Patient ID: Roberta [name], female   DOB: 1956/07/04, 59 y.o.   MRN: [id number]   Chief Complaint  Patient presents with  . Advice Only    refer from [facility]    HPI Roberta [name] is a 59 y.o. female.  Here for consult on recent endometrial and cervical biopsy reports.  Had endometrial biopsy on 05/12/15 that showed simple & complex hyperplasia with focal atypia.  Patient desires a female Psychologist, sport and exercise.  Dr. Jodi Mourning consulted regarding results.  Decision to refer patient back to [facility] and Dr. Leo Grosser for her D&C.    HPI  Past Medical History  Diagnosis Date  . Anemia   . Asthma   . Depression   . Hyperlipidemia   . Hypertension   . ALLERGIC RHINITIS 10/14/2010  . ASTHMA, INTERMITTENT 02/16/2007  . BACK PAIN, CHRONIC 03/25/2009  . CERVICAL RADICULOPATHY 10/16/2009  . DEPRESSIVE DISORDER NOT ELSEWHERE CLASSIFIED 03/25/2009  . Gastric polyp 09/09/2011    Patient has history of a biopsy approximately 3 years ago of having a gastric polyp has endoscopy as well as colonoscopy every 5 years .   Marland Kitchen GERD (gastroesophageal reflux disease) 09/09/2011  . Glaucoma     sees optho every 3 months, drops each night  . Postmenopausal vaginal bleeding 09/24/2014    Past Surgical History  Procedure Laterality Date  . Cervical spine surgery    . Rotator cuff repair Right May 2014  . Cholecystectomy  1980  . Bunionectomy      bilateral and toe correction  . Knee arthroscopy    . Ankle surgery    . Breast cyst incision and drainage      under breast    Family History  Problem Relation Age of Onset  . Heart disease Mother     MI in 43s  . Lymphoma Mother     related to asbestos  . Alcoholism Father   . Cirrhosis Father     due to alcohol  . Asthma Father     Social History History  Substance Use Topics  . Smoking status: Never Smoker   . Smokeless tobacco: Not on file  . Alcohol Use: No    Allergies  Allergen Reactions  . Influenza Vaccines Anaphylaxis    Throat swelling reported after flu  shot in the past (cannot verify with records but would not give)  . Budesonide Other (See Comments)    Throat infection per patient  . Prednisone Other (See Comments)    increased vaginal bleeding, able to tolerate low dose (40 mg)  . Codeine Nausea And Vomiting  . Morphine And Related Nausea And Vomiting    Current Outpatient Prescriptions  Medication Sig Dispense Refill  . albuterol (PROAIR HFA) 108 (90 BASE) MCG/ACT inhaler Inhale 2 puffs into the lungs every 4 (four) hours as needed. 2 Inhaler 11  . aspirin 81 MG tablet Take 81 mg by mouth daily.    . Cholecalciferol (VITAMIN D-3) 1000 UNITS CAPS Take 3 capsules by mouth daily.    . cyclobenzaprine (FLEXERIL) 5 MG tablet Take 5 mg by mouth 3 (three) times daily as needed for muscle spasms.    . ferrous sulfate 325 (65 FE) MG EC tablet Take 325 mg by mouth daily as needed.    . hydrochlorothiazide (HYDRODIURIL) 25 MG tablet Take 1 tablet (25 mg total) by mouth daily. 90 tablet 3  . lisinopril (PRINIVIL,ZESTRIL) 5 MG tablet Take 1 tablet (5 mg total) by mouth daily. 90 tablet 3  .  meclizine (ANTIVERT) 12.5 MG tablet Take 1 tablet (12.5 mg total) by mouth 3 (three) times daily as needed for dizziness. 30 tablet 1  . Multiple Vitamin (MULTIVITAMIN) tablet Take 1 tablet by mouth daily.    . ondansetron (ZOFRAN) 4 MG tablet Take 1 tablet (4 mg total) by mouth every 8 (eight) hours as needed for nausea or vomiting. 20 tablet 0  . ranitidine (ZANTAC) 150 MG tablet Take 1 tablet (150 mg total) by mouth 2 (two) times daily. 60 tablet 1  . traMADol (ULTRAM) 50 MG tablet Take 50 mg by mouth every 6 (six) hours as needed.      Marland Kitchen albuterol (PROVENTIL) (2.5 MG/3ML) 0.083% nebulizer solution Take 3 mLs (2.5 mg total) by nebulization every 6 (six) hours as needed for wheezing or shortness of breath. (Patient not taking: Reported on 05/22/2015) 75 mL 1  . esomeprazole (NEXIUM) 20 MG capsule Take 1 capsule (20 mg total) by mouth daily at 12 noon. (Patient not  taking: Reported on 07/02/2015) 30 capsule 2  . Respiratory Therapy Supplies (NEBULIZER/TUBING/MOUTHPIECE) KIT Use with nebulizer and albuterol prescription. 1 each 0  . Travoprost, BAK Free, (TRAVATAN) 0.004 % SOLN ophthalmic solution Place 1 drop into both eyes at bedtime. (Patient not taking: Reported on 07/02/2015) 2.5 mL 3   No current facility-administered medications for this visit.    Review of Systems Review of Systems Constitutional: negative for fatigue and weight loss Respiratory: negative for cough and wheezing Cardiovascular: negative for chest pain, fatigue and palpitations Gastrointestinal: negative for abdominal pain and change in bowel habits Genitourinary:negative Integument/breast: negative for nipple discharge Musculoskeletal:negative for myalgias Neurological: negative for gait problems and tremors Behavioral/Psych: negative for abusive relationship, depression Endocrine: negative for temperature intolerance     Blood pressure 111/82, pulse 76, temperature 98.5 F (36.9 C), height 5' 6.5" (1.689 m), weight 256 lb (116.121 kg).  Physical Exam Physical Exam General:   alert  Skin:   no rash or abnormalities  Lungs:   clear to auscultation bilaterally  Heart:   regular rate and rhythm, S1, S2 normal, no murmur, click, rub or gallop  Breasts:   deferred  Abdomen:  normal findings: no organomegaly, soft, non-tender and no hernia  Pelvis:  deferred    90% of 15 min visit spent on counseling and coordination of care.   Data Reviewed Previous medical hx, labs, meds  Assessment     Endometrial hyperplasia on pathology     Plan   F/U with Dr. Leo Grosser @ [place] for D&C No orders of the defined types were placed in this encounter.   No orders of the defined types were placed in this encounter.

## 2015-07-03 ENCOUNTER — Telehealth: Payer: Self-pay | Admitting: *Deleted

## 2015-07-03 DIAGNOSIS — D259 Leiomyoma of uterus, unspecified: Secondary | ICD-10-CM

## 2015-07-03 NOTE — Telephone Encounter (Signed)
Pt informed that referral had been placed. Roberta [name], [name], [place]

## 2015-07-03 NOTE — Telephone Encounter (Signed)
Pt called stating she will need a referral to [facility] for Dr. Kendall Flack.  Pt is going to a D&C done, but her insurance is requiring her PCP to complete the referral first.  Please give her a call at [phone]. Derl Barrow, RN

## 2015-07-04 ENCOUNTER — Other Ambulatory Visit: Payer: Self-pay | Admitting: Obstetrics and Gynecology

## 2015-07-14 ENCOUNTER — Encounter: Payer: Self-pay | Admitting: Family Medicine

## 2015-07-14 ENCOUNTER — Other Ambulatory Visit: Payer: Self-pay | Admitting: Family Medicine

## 2015-07-14 DIAGNOSIS — R3915 Urgency of urination: Secondary | ICD-10-CM | POA: Diagnosis not present

## 2015-07-21 ENCOUNTER — Ambulatory Visit (INDEPENDENT_AMBULATORY_CARE_PROVIDER_SITE_OTHER): Payer: Medicare Other | Admitting: Cardiovascular Disease

## 2015-07-21 ENCOUNTER — Encounter: Payer: Self-pay | Admitting: Cardiovascular Disease

## 2015-07-21 ENCOUNTER — Encounter (HOSPITAL_COMMUNITY)
Admission: RE | Admit: 2015-07-21 | Discharge: 2015-07-21 | Disposition: A | Discharge status: Home / Self Care | Payer: Medicare Other | Source: Ambulatory Visit | Attending: Obstetrics and Gynecology | Admitting: Obstetrics and Gynecology

## 2015-07-21 ENCOUNTER — Telehealth: Payer: Self-pay | Admitting: *Deleted

## 2015-07-21 ENCOUNTER — Encounter (HOSPITAL_COMMUNITY): Payer: Self-pay

## 2015-07-21 VITALS — BP 130/84 | HR 77 | Ht 66.5 in | Wt 256.2 lb

## 2015-07-21 DIAGNOSIS — N8500 Endometrial hyperplasia, unspecified: Secondary | ICD-10-CM | POA: Diagnosis not present

## 2015-07-21 DIAGNOSIS — Z01818 Encounter for other preprocedural examination: Secondary | ICD-10-CM | POA: Diagnosis not present

## 2015-07-21 DIAGNOSIS — I10 Essential (primary) hypertension: Secondary | ICD-10-CM | POA: Diagnosis not present

## 2015-07-21 DIAGNOSIS — R9431 Abnormal electrocardiogram [ECG] [EKG]: Secondary | ICD-10-CM | POA: Diagnosis not present

## 2015-07-21 HISTORY — DX: Nausea with vomiting, unspecified: R11.2

## 2015-07-21 HISTORY — DX: Other specified postprocedural states: Z98.890

## 2015-07-21 LAB — CBC
HCT: 39.6 % (ref 36.0–46.0)
Hemoglobin: 12.2 g/dL (ref 12.0–15.0)
MCH: 26.3 pg (ref 26.0–34.0)
MCHC: 30.8 g/dL (ref 30.0–36.0)
MCV: 85.3 fL (ref 78.0–100.0)
PLATELETS: 327 10*3/uL (ref 150–400)
RBC: 4.64 MIL/uL (ref 3.87–5.11)
RDW: 15.3 % (ref 11.5–15.5)
WBC: 6 10*3/uL (ref 4.0–10.5)

## 2015-07-21 LAB — BASIC METABOLIC PANEL
Anion gap: 9 (ref 5–15)
BUN: 20 mg/dL (ref 6–20)
CO2: 30 mmol/L (ref 22–32)
Calcium: 9.5 mg/dL (ref 8.9–10.3)
Chloride: 106 mmol/L (ref 101–111)
Creatinine, Ser: 1.05 mg/dL — ABNORMAL HIGH (ref 0.44–1.00)
GFR calc Af Amer: >60 mL/min (ref >60)
GFR calc non Af Amer: 57 mL/min — ABNORMAL LOW (ref >60)
Glucose, Bld: 100 mg/dL — ABNORMAL HIGH (ref 65–99)
Potassium: 4.3 mmol/L (ref 3.5–5.1)
SODIUM: 145 mmol/L (ref 135–145)

## 2015-07-21 NOTE — Patient Instructions (Signed)
Your procedure is scheduled on:07/28/15  Enter through the Main Entrance at :0930 am Pick up desk phone and dial [phone] and inform us of your arrival.  Please call [phone] if you have any problems the morning of surgery.  Remember: Do not eat food after midnight:Sunday Clear liquids are ok until: 7am on Monday   You may brush your teeth the morning of surgery.  Take these meds the morning of surgery with a sip of water:usual morning meds  DO NOT wear jewelry, eye make-up, lipstick,body lotion, or dark fingernail polish.  (Polished toes are ok) You may wear deodorant.  If you are to be admitted after surgery, leave suitcase in car until your room has been assigned. Patients discharged on the day of surgery will not be allowed to drive home. Wear loose fitting, comfortable clothes for your ride home.

## 2015-07-21 NOTE — Progress Notes (Signed)
07/21/2015 [name]   1956-02-22  [id number]  Primary Physician Cordelia Poche, MD Primary Cardiologist: Lorretta Harp MD Renae Gloss '  HPI:  Mrs. Roberta [name] is a delightful 59 year old moderately overweight divorced African-American female mother of 4 children, grandmother of 7 grandchildren referred for cardiovascular clearance before elective outpatient D&C. Her primary care physician is Dr. Lonny Prude  and OB/GYN is Dr. Leo Grosser. Her cardiac risk factor profile is notable for treated hypertension and hyperlipidemia. She does have a family history of heart disease. She's had cholecystectomy in the past. She's never had a heart stroke and denies chest pain or shortness of breath. She was scheduled for elective outpatient D&C. Cannot lift her cardiac exam showed nonspecific changes and she was referred for preoperative clearance.   Current Outpatient Prescriptions  Medication Sig Dispense Refill  . albuterol (PROAIR HFA) 108 (90 BASE) MCG/ACT inhaler Inhale 2 puffs into the lungs every 4 (four) hours as needed. 2 Inhaler 11  . albuterol (PROVENTIL) (2.5 MG/3ML) 0.083% nebulizer solution Take 3 mLs (2.5 mg total) by nebulization every 6 (six) hours as needed for wheezing or shortness of breath. 75 mL 1  . aspirin 81 MG tablet Take 81 mg by mouth daily.    . Cholecalciferol (VITAMIN D-3) 1000 UNITS CAPS Take 3 capsules by mouth daily.    . cyclobenzaprine (FLEXERIL) 5 MG tablet Take 5 mg by mouth 3 (three) times daily as needed for muscle spasms.    . ferrous sulfate 325 (65 FE) MG EC tablet Take 325 mg by mouth daily as needed.    . hydrochlorothiazide (HYDRODIURIL) 25 MG tablet TAKE 1 TABLET BY MOUTH DAILY 90 tablet 0  . lisinopril (PRINIVIL,ZESTRIL) 5 MG tablet Take 1 tablet (5 mg total) by mouth daily. 90 tablet 3  . meclizine (ANTIVERT) 12.5 MG tablet Take 1 tablet (12.5 mg total) by mouth 3 (three) times daily as needed for dizziness. 30 tablet 1  . Multiple Vitamin  (MULTIVITAMIN) tablet Take 1 tablet by mouth daily.    . Multiple Vitamins-Minerals (HAIR SKIN AND NAILS FORMULA) TABS Take by mouth.    . ondansetron (ZOFRAN) 4 MG tablet Take 1 tablet (4 mg total) by mouth every 8 (eight) hours as needed for nausea or vomiting. 20 tablet 0  . ranitidine (ZANTAC) 150 MG tablet Take 1 tablet (150 mg total) by mouth 2 (two) times daily. 60 tablet 1  . Respiratory Therapy Supplies (NEBULIZER/TUBING/MOUTHPIECE) KIT Use with nebulizer and albuterol prescription. 1 each 0  . traMADol (ULTRAM) 50 MG tablet Take 50 mg by mouth every 6 (six) hours as needed.      . Travoprost, BAK Free, (TRAVATAN) 0.004 % SOLN ophthalmic solution Place 1 drop into both eyes at bedtime. 2.5 mL 3   No current facility-administered medications for this visit.    Allergies  Allergen Reactions  . Influenza Vaccines Anaphylaxis    Throat swelling reported after flu shot in the past (cannot verify with records but would not give)  . Budesonide Other (See Comments)    Throat infection per patient  . Prednisone Other (See Comments)    increased vaginal bleeding, able to tolerate low dose (40 mg)  . Codeine Nausea And Vomiting  . Morphine And Related Nausea And Vomiting    History   Social History  . Marital Status: Divorced    Spouse Name: N/A  . Number of Children: 4  . Years of Education: 134   Occupational History  . DISABLED   .  Previously a CNA    Social History Main Topics  . Smoking status: Never Smoker   . Smokeless tobacco: Not on file  . Alcohol Use: No  . Drug Use: No  . Sexual Activity: No   Other Topics Concern  . 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 Part time student due to learning disability. 4 kids.       On disability.                     Review of Systems: General: negative for chills, fever, night sweats or  weight changes.  Cardiovascular: negative for chest pain, dyspnea on exertion, edema, orthopnea, palpitations, paroxysmal nocturnal dyspnea or shortness of breath Dermatological: negative for rash Respiratory: negative for cough or wheezing Urologic: negative for hematuria Abdominal: negative for nausea, vomiting, diarrhea, bright red blood per rectum, melena, or hematemesis Neurologic: negative for visual changes, syncope, or dizziness All other systems reviewed and are otherwise negative except as noted above.    Blood pressure 130/84, pulse 77, height 5' 6.5" (1.689 m), weight 256 lb 3.2 oz (116.212 kg).  General appearance: alert and no distress Neck: no adenopathy, no carotid bruit, no JVD, supple, symmetrical, trachea midline and thyroid not enlarged, symmetric, no tenderness/mass/nodules Lungs: clear to auscultation bilaterally Heart: regular rate and rhythm, S1, S2 normal, no murmur, click, rub or gallop Extremities: extremities normal, atraumatic, no cyanosis or edema  EKG /77 with nonspecific ST and T-wave changes. I personally reviewed this EKG  ASSESSMENT AND PLAN:   HYPERTENSION, BENIGN SYSTEMIC History of hypertension with blood pressure measures at 130/84. She is on lisinopril and hydrochlorothiazide. Continue current meds at current dosing  HYPERLIPIDEMIA History of hyperlipidemia not on statin therapy  With recent lipid profile performed 2 years ago that revealed total cholesterol 196, LDL 1:30 and HDL of 42. This is followed by her primary care physician.  Abnormal EKG Miss Manrique was referred to me for preoperative clearance because of an abnormal EKG. She scheduled to have elective D&C by Dr. Leo Grosser as an outpatient. Her cardiac risk factor profile is notable for hypertension and hyperlipidemia. She does have a family history of heart disease with 2 siblings have had myocardial infarctions. She has never smoked. She has never had a heart attack or stroke and denies  chest pain or shortness of breath. Her EKG shows sinus rhythm at 77 with nonspecific ST and T-wave changes. I think she is low risk undergo her outpatient procedure I do not feel she needs any functional testing. I cleared her low risk. I will see her back when necessary.      Lorretta Harp MD FACP,FACC,FAHA, [facility] 07/21/2015 3:02 PM

## 2015-07-21 NOTE — Telephone Encounter (Signed)
Adrianne from [facility] called stating patient need a cardiac clearance.  She was going to make some phone calls to cardiologist office, but the referral will need to be placed by PCP.  She was going to call back once she found a cardiologist.  Call with questions [phone].  Derl Barrow, RN

## 2015-07-21 NOTE — Patient Instructions (Signed)
Your physician recommends that you schedule a follow-up appointment AS Needed

## 2015-07-21 NOTE — Telephone Encounter (Signed)
Adrianne called back stating patient has an appt today 07/21/15 at 2 PM with Dr. Quay Burow.  Dr. Kennon Holter office number is [phone] or [phone] [facility].  Please give Adrianne a call back once completed.  Derl Barrow, RN

## 2015-07-21 NOTE — Telephone Encounter (Signed)
NPI given to [facility] at [facility] for patient's appt today. Called [facility] and advised her of this. Patient is set for appt today. Nothing more to do.

## 2015-07-21 NOTE — Assessment & Plan Note (Signed)
Roberta [name] was referred to me for preoperative clearance because of an abnormal EKG. She scheduled to have elective D&C by Dr. Leo Grosser as an outpatient. Her cardiac risk factor profile is notable for hypertension and hyperlipidemia. She does have a family history of heart disease with 2 siblings have had myocardial infarctions. She has never smoked. She has never had a heart attack or stroke and denies chest pain or shortness of breath. Her EKG shows sinus rhythm at 77 with nonspecific ST and T-wave changes. I think she is low risk undergo her outpatient procedure I do not feel she needs any functional testing. I cleared her low risk. I will see her back when necessary.

## 2015-07-21 NOTE — Assessment & Plan Note (Signed)
History of hypertension with blood pressure measures at 130/84. She is on lisinopril and hydrochlorothiazide. Continue current meds at current dosing

## 2015-07-21 NOTE — Assessment & Plan Note (Signed)
History of hyperlipidemia not on statin therapy  With recent lipid profile performed 2 years ago that revealed total cholesterol 196, LDL 1:30 and HDL of 42. This is followed by her primary care physician.

## 2015-07-21 NOTE — Pre-Procedure Instructions (Signed)
Abnormal EKG shown to Dr. Royce Macadamia and he wants pt to have a cardiology workup. I called Adrienne in office and she will arrange a cardio. appt for pt.

## 2015-07-24 ENCOUNTER — Other Ambulatory Visit: Payer: Self-pay | Admitting: Gastroenterology

## 2015-07-24 DIAGNOSIS — Z09 Encounter for follow-up examination after completed treatment for conditions other than malignant neoplasm: Secondary | ICD-10-CM | POA: Diagnosis not present

## 2015-07-24 DIAGNOSIS — D122 Benign neoplasm of ascending colon: Secondary | ICD-10-CM | POA: Diagnosis not present

## 2015-07-24 DIAGNOSIS — D120 Benign neoplasm of cecum: Secondary | ICD-10-CM | POA: Diagnosis not present

## 2015-07-24 DIAGNOSIS — K5730 Diverticulosis of large intestine without perforation or abscess without bleeding: Secondary | ICD-10-CM | POA: Diagnosis not present

## 2015-07-24 DIAGNOSIS — D126 Benign neoplasm of colon, unspecified: Secondary | ICD-10-CM | POA: Diagnosis not present

## 2015-07-24 DIAGNOSIS — D124 Benign neoplasm of descending colon: Secondary | ICD-10-CM | POA: Diagnosis not present

## 2015-07-24 DIAGNOSIS — K640 First degree hemorrhoids: Secondary | ICD-10-CM | POA: Diagnosis not present

## 2015-07-24 DIAGNOSIS — Z86010 Personal history of colonic polyps: Secondary | ICD-10-CM | POA: Diagnosis not present

## 2015-07-28 ENCOUNTER — Ambulatory Visit (HOSPITAL_COMMUNITY): Payer: Medicare Other | Admitting: Anesthesiology

## 2015-07-28 ENCOUNTER — Encounter (HOSPITAL_COMMUNITY)
Admission: RE | Disposition: A | Discharge status: Home / Self Care | Payer: Self-pay | Source: Ambulatory Visit | Attending: Obstetrics and Gynecology

## 2015-07-28 ENCOUNTER — Ambulatory Visit (HOSPITAL_COMMUNITY)
Admission: RE | Admit: 2015-07-28 | Discharge: 2015-07-28 | Disposition: A | Discharge status: Home / Self Care | Payer: Medicare Other | Source: Ambulatory Visit | Attending: Obstetrics and Gynecology | Admitting: Obstetrics and Gynecology

## 2015-07-28 ENCOUNTER — Encounter (HOSPITAL_COMMUNITY): Payer: Self-pay | Admitting: Obstetrics and Gynecology

## 2015-07-28 DIAGNOSIS — Z888 Allergy status to other drugs, medicaments and biological substances status: Secondary | ICD-10-CM | POA: Insufficient documentation

## 2015-07-28 DIAGNOSIS — Z7982 Long term (current) use of aspirin: Secondary | ICD-10-CM | POA: Diagnosis not present

## 2015-07-28 DIAGNOSIS — E785 Hyperlipidemia, unspecified: Secondary | ICD-10-CM | POA: Insufficient documentation

## 2015-07-28 DIAGNOSIS — Z79899 Other long term (current) drug therapy: Secondary | ICD-10-CM | POA: Insufficient documentation

## 2015-07-28 DIAGNOSIS — N8502 Endometrial intraepithelial neoplasia [EIN]: Secondary | ICD-10-CM | POA: Diagnosis present

## 2015-07-28 DIAGNOSIS — F329 Major depressive disorder, single episode, unspecified: Secondary | ICD-10-CM | POA: Diagnosis not present

## 2015-07-28 DIAGNOSIS — D251 Intramural leiomyoma of uterus: Secondary | ICD-10-CM

## 2015-07-28 DIAGNOSIS — Z887 Allergy status to serum and vaccine status: Secondary | ICD-10-CM | POA: Diagnosis not present

## 2015-07-28 DIAGNOSIS — Z885 Allergy status to narcotic agent status: Secondary | ICD-10-CM | POA: Insufficient documentation

## 2015-07-28 DIAGNOSIS — N8500 Endometrial hyperplasia, unspecified: Secondary | ICD-10-CM | POA: Diagnosis not present

## 2015-07-28 DIAGNOSIS — H409 Unspecified glaucoma: Secondary | ICD-10-CM | POA: Diagnosis not present

## 2015-07-28 DIAGNOSIS — K219 Gastro-esophageal reflux disease without esophagitis: Secondary | ICD-10-CM | POA: Insufficient documentation

## 2015-07-28 DIAGNOSIS — I10 Essential (primary) hypertension: Secondary | ICD-10-CM | POA: Insufficient documentation

## 2015-07-28 DIAGNOSIS — D649 Anemia, unspecified: Secondary | ICD-10-CM | POA: Insufficient documentation

## 2015-07-28 DIAGNOSIS — M549 Dorsalgia, unspecified: Secondary | ICD-10-CM | POA: Diagnosis not present

## 2015-07-28 DIAGNOSIS — N950 Postmenopausal bleeding: Secondary | ICD-10-CM | POA: Diagnosis not present

## 2015-07-28 DIAGNOSIS — J45909 Unspecified asthma, uncomplicated: Secondary | ICD-10-CM | POA: Diagnosis not present

## 2015-07-28 HISTORY — PX: DILATATION & CURRETTAGE/HYSTEROSCOPY WITH RESECTOCOPE: SHX5572

## 2015-07-28 HISTORY — DX: Endometrial intraepithelial neoplasia (EIN): N85.02

## 2015-07-28 SURGERY — DILATATION & CURETTAGE/HYSTEROSCOPY WITH RESECTOCOPE
Anesthesia: General

## 2015-07-28 MED ORDER — LIDOCAINE HCL 2 % IJ SOLN
INTRAMUSCULAR | Status: AC
  Filled 2015-07-28: qty 20

## 2015-07-28 MED ORDER — SCOPOLAMINE 1 MG/3DAYS TD PT72
1.0000 | MEDICATED_PATCH | Freq: Once | TRANSDERMAL | Status: DC
  Administered 2015-07-28: 1.5 mg via TRANSDERMAL

## 2015-07-28 MED ORDER — FENTANYL CITRATE (PF) 100 MCG/2ML IJ SOLN: 25.0000 ug | INTRAMUSCULAR | Status: DC | PRN

## 2015-07-28 MED ORDER — KETOROLAC TROMETHAMINE 30 MG/ML IJ SOLN
INTRAMUSCULAR | Status: AC
  Filled 2015-07-28: qty 2

## 2015-07-28 MED ORDER — LACTATED RINGERS IV SOLN
INTRAVENOUS | Status: DC
Start: 2015-07-28 — End: 2015-07-28
  Administered 2015-07-28 (×2): via INTRAVENOUS

## 2015-07-28 MED ORDER — MIDAZOLAM HCL 2 MG/2ML IJ SOLN
INTRAMUSCULAR | Status: AC
  Filled 2015-07-28: qty 4

## 2015-07-28 MED ORDER — LIDOCAINE HCL (CARDIAC) 20 MG/ML IV SOLN
INTRAVENOUS | Status: AC
  Filled 2015-07-28: qty 5

## 2015-07-28 MED ORDER — KETOROLAC TROMETHAMINE 30 MG/ML IJ SOLN
INTRAMUSCULAR | Status: DC | PRN
  Administered 2015-07-28: 30 mg via INTRAVENOUS
  Administered 2015-07-28: 30 mg via INTRAMUSCULAR

## 2015-07-28 MED ORDER — ONDANSETRON HCL 4 MG/2ML IJ SOLN
INTRAMUSCULAR | Status: DC | PRN
  Administered 2015-07-28: 4 mg via INTRAVENOUS

## 2015-07-28 MED ORDER — PROPOFOL 10 MG/ML IV BOLUS
INTRAVENOUS | Status: DC | PRN
  Administered 2015-07-28: 200 mg via INTRAVENOUS

## 2015-07-28 MED ORDER — SCOPOLAMINE 1 MG/3DAYS TD PT72
MEDICATED_PATCH | TRANSDERMAL | Status: AC
  Administered 2015-07-28: 1.5 mg via TRANSDERMAL
  Filled 2015-07-28: qty 1

## 2015-07-28 MED ORDER — PROPOFOL 10 MG/ML IV BOLUS
INTRAVENOUS | Status: AC
  Filled 2015-07-28: qty 20

## 2015-07-28 MED ORDER — DEXAMETHASONE SODIUM PHOSPHATE 4 MG/ML IJ SOLN
INTRAMUSCULAR | Status: AC
  Filled 2015-07-28: qty 1

## 2015-07-28 MED ORDER — KETOROLAC TROMETHAMINE 30 MG/ML IJ SOLN: 30.0000 mg | Freq: Once | INTRAMUSCULAR | Status: DC | PRN

## 2015-07-28 MED ORDER — ONDANSETRON HCL 4 MG/2ML IJ SOLN
INTRAMUSCULAR | Status: AC
  Filled 2015-07-28: qty 2

## 2015-07-28 MED ORDER — MEPERIDINE HCL 25 MG/ML IJ SOLN: 6.2500 mg | INTRAMUSCULAR | Status: DC | PRN

## 2015-07-28 MED ORDER — LIDOCAINE HCL (CARDIAC) 20 MG/ML IV SOLN
INTRAVENOUS | Status: DC | PRN
  Administered 2015-07-28: 100 mg via INTRAVENOUS

## 2015-07-28 MED ORDER — DEXAMETHASONE SODIUM PHOSPHATE 4 MG/ML IJ SOLN
INTRAMUSCULAR | Status: DC | PRN
  Administered 2015-07-28: 4 mg via INTRAVENOUS

## 2015-07-28 MED ORDER — GLYCINE 1.5 % IR SOLN
Status: DC | PRN
  Administered 2015-07-28: 3000 mL

## 2015-07-28 MED ORDER — LIDOCAINE HCL 2 % IJ SOLN
INTRAMUSCULAR | Status: DC | PRN
  Administered 2015-07-28: 10 mL

## 2015-07-28 MED ORDER — ONDANSETRON HCL 4 MG/2ML IJ SOLN: 4.0000 mg | Freq: Once | INTRAMUSCULAR | Status: DC | PRN

## 2015-07-28 MED ORDER — FENTANYL CITRATE (PF) 100 MCG/2ML IJ SOLN
INTRAMUSCULAR | Status: DC | PRN
  Administered 2015-07-28: 25 ug via INTRAVENOUS
  Administered 2015-07-28: 50 ug via INTRAVENOUS
  Administered 2015-07-28: 25 ug via INTRAVENOUS

## 2015-07-28 MED ORDER — FENTANYL CITRATE (PF) 100 MCG/2ML IJ SOLN
INTRAMUSCULAR | Status: AC
  Filled 2015-07-28: qty 4

## 2015-07-28 MED ORDER — IBUPROFEN 600 MG PO TABS: ORAL_TABLET | ORAL | Status: DC

## 2015-07-28 MED ORDER — MIDAZOLAM HCL 5 MG/5ML IJ SOLN
INTRAMUSCULAR | Status: DC | PRN
  Administered 2015-07-28: 2 mg via INTRAVENOUS

## 2015-07-28 SURGICAL SUPPLY — 25 items
BOOTIES KNEE HIGH SLOAN (MISCELLANEOUS) ×6 IMPLANT
CANISTER SUCT 3000ML (MISCELLANEOUS) ×3 IMPLANT
CATH ROBINSON RED A/P 16FR (CATHETERS) ×3 IMPLANT
CLOTH BEACON ORANGE TIMEOUT ST (SAFETY) ×3 IMPLANT
CONTAINER PREFILL 10% NBF 60ML (FORM) ×6 IMPLANT
CORD ACTIVE DISPOSABLE (ELECTRODE) ×2
CORD ELECTRO ACTIVE DISP (ELECTRODE) ×1 IMPLANT
DILATOR CANAL MILEX (MISCELLANEOUS) IMPLANT
ELECT LOOP GYNE PRO 24FR (CUTTING LOOP)
ELECT REM PT RETURN 9FT ADLT (ELECTROSURGICAL) ×3
ELECT VAPORTRODE GRVD BAR (ELECTRODE) IMPLANT
ELECTRODE LOOP GYNE PRO 24FR (CUTTING LOOP) IMPLANT
ELECTRODE REM PT RTRN 9FT ADLT (ELECTROSURGICAL) ×1 IMPLANT
ELECTRODE ROLLER VERSAPOINT (ELECTRODE) IMPLANT
ELECTRODE RT ANGLE VERSAPOINT (CUTTING LOOP) IMPLANT
ELECTRODE TWIZZLE TIP (MISCELLANEOUS) IMPLANT
GLOVE SURG SS PI 6.5 STRL IVOR (GLOVE) ×6 IMPLANT
GOWN STRL REUS W/TWL LRG LVL3 (GOWN DISPOSABLE) ×6 IMPLANT
LOOP ANGLED CUTTING 22FR (CUTTING LOOP) IMPLANT
PACK VAGINAL MINOR WOMEN LF (CUSTOM PROCEDURE TRAY) ×3 IMPLANT
PAD OB MATERNITY 4.3X12.25 (PERSONAL CARE ITEMS) ×3 IMPLANT
TOWEL OR 17X24 6PK STRL BLUE (TOWEL DISPOSABLE) ×6 IMPLANT
TUBING AQUILEX INFLOW (TUBING) ×3 IMPLANT
TUBING AQUILEX OUTFLOW (TUBING) ×3 IMPLANT
WATER STERILE IRR 1000ML POUR (IV SOLUTION) ×3 IMPLANT

## 2015-07-28 NOTE — Transfer of Care (Signed)
Immediate Anesthesia Transfer of Care Note  Patient: Roberta [name]  Procedure(s) Performed: Procedure(s): DILATATION & CURETTAGE/HYSTEROSCOPY WITH RESECTOCOPE (N/A)  Patient Location: PACU  Anesthesia Type:General  Level of Consciousness: awake, alert  and oriented  Airway & Oxygen Therapy: Patient Spontanous Breathing and Patient connected to nasal cannula oxygen  Post-op Assessment: Report given to RN  Post vital signs: Reviewed  Last Vitals:  Filed Vitals:   07/28/15 0945  BP: 138/88  Pulse: 75  Temp: 36.8 C  Resp: 16    Complications: No apparent anesthesia complications

## 2015-07-28 NOTE — Anesthesia Procedure Notes (Signed)
Procedure Name: LMA Insertion Date/Time: 07/28/2015 11:09 AM Performed by: Talbot Grumbling Pre-anesthesia Checklist: Patient identified, Emergency Drugs available, Suction available and Patient being monitored Patient Re-evaluated:Patient Re-evaluated prior to inductionOxygen Delivery Method: Circle system utilized Preoxygenation: Pre-oxygenation with 100% oxygen Intubation Type: IV induction Ventilation: Mask ventilation without difficulty LMA: LMA inserted LMA Size: 4.0 Number of attempts: 1 Placement Confirmation: positive ETCO2 and breath sounds checked- equal and bilateral Tube secured with: Tape Dental Injury: Teeth and Oropharynx as per pre-operative assessment

## 2015-07-28 NOTE — Op Note (Signed)
07/28/2015  11:40 AM  PATIENT:  Roberta [name]  59 y.o. female  PRE-OPERATIVE DIAGNOSIS:  Endometrial Hyperplasia  POST-OPERATIVE DIAGNOSIS:  Endometrial Hyperplasia  PROCEDURE:  Procedure(s): DILATATION & CURETTAGE/HYSTEROSCOPY  SURGEON:  Tonantzin Mimnaugh P, MD  ASSISTANTS: None  ANESTHESIA:   local and general  ESTIMATED BLOOD LOSS: Less than 10 cc  BLOOD ADMINISTERED:none  COMPLICATIONS: None  FINDINGS: The uterus sounded to 10 cm. At hysteroscopy. No specific endometrial lesions were noted. The tubal ostia were identified.  FLUID DEFICIT: 70 cc.  LOCAL MEDICATIONS USED:  LIDOCAINE  and Amount: 10 ml  SPECIMEN:  Source of Specimen:  Endometrial curettings and endocervical curettings  DISPOSITION OF SPECIMEN:  PATHOLOGY  COUNTS:  YES  DESCRIPTION OF PROCEDURE:the patient was taken to the operating room after appropriate identification and placed on the operating table. After the attainment of adequate general anesthesia she was placed in the lithotomy position. The perineum and vagina were prepped with multiple layers of Betadine. The bladder was emptied with a an in and out catheter. The perineum was draped in sterile field. A gray speculum was placed in the vagina. The cervix was grasped with a single-tooth tenaculum. A paracervical block was achieved with a total of 10 cc of 2% Xylocaine and the 5 and 7:00 positions. The uterus was sounded.  The cervix was then dilated to accommodate the diagnostic hysteroscope. The hysteroscope was used to evaluate all quadrants of the uterus with the above-noted findings. The hysteroscope was removed and a sharp curet used to curet all 4 quadrants of the uterus with minimal tissue obtained. All instruments were then removed from the vagina and the patient was awakened from general anesthesia and taken to the recovery room in satisfactory condition having tolerated the procedure well sponge and instrument counts correct.  PLAN OF CARE:  Discharge home after postanesthesia care  PATIENT DISPOSITION:  PACU - hemodynamically stable.   Delay start of Pharmacological VTE agent (>24hrs) due to surgical blood loss or risk of bleeding:  SCD hose used during the case   Nasiah Polinsky P, MD 11:40 AM

## 2015-07-28 NOTE — Discharge Instructions (Signed)
Hysteroscopy, Care After Refer to this sheet in the next few weeks. These instructions provide you with information on caring for yourself after your procedure. Your health care provider may also give you more specific instructions. Your treatment has been planned according to current medical practices, but problems sometimes occur. Call your health care provider if you have any problems or questions after your procedure.  WHAT TO EXPECT AFTER THE PROCEDURE After your procedure, it is typical to have the following:  You may have some cramping. This normally lasts for a couple days.  You may have bleeding. This can vary from light spotting for a few days to menstrual-like bleeding for 3-7 days. HOME CARE INSTRUCTIONS  Rest for the first 1-2 days after the procedure.  Only take over-the-counter or prescription medicines as directed by your health care provider. Do not take aspirin. It can increase the chances of bleeding.  Take showers instead of baths for 2 weeks or as directed by your health care provider.  Do not drive for 24 hours or as directed.  Do not drink alcohol while taking pain medicine.  Do not use tampons, douche, or have sexual intercourse for 2 weeks or until your health care provider says it is okay.  Take your temperature twice a day for 4-5 days. Write it down each time.  Follow your health care provider's advice about diet, exercise, and lifting.  If you develop constipation, you may:  Take a mild laxative if your health care provider approves.  Add bran foods to your diet.  Drink enough fluids to keep your urine clear or pale yellow.  Try to have someone with you or available to you for the first 24-48 hours, especially if you were given a general anesthetic.  Follow up with your health care provider as directed. SEEK MEDICAL CARE IF:  You feel dizzy or lightheaded.  You feel sick to your stomach (nauseous).  You have abnormal vaginal discharge.  You  have a rash.  You have pain that is not controlled with medicine. SEEK IMMEDIATE MEDICAL CARE IF:  You have bleeding that is heavier than a normal menstrual period.  You have a fever.  You have increasing cramps or pain, not controlled with medicine.  You have new belly (abdominal) pain.  You pass out.  You have pain in the tops of your shoulders (shoulder strap areas).  You have shortness of breath. Document Released: 09/26/2013 Document Reviewed: 09/26/2013 [facility] Patient Information 2015 [place], [place]. This information is not intended to replace advice given to you by your health care provider. Make sure you discuss any questions you have with your health care provider.   MAY TAKE IBUPROFEN (MOTRIN, ADVIL) OR ALEVE AFTER 5:15 PM FOR PAIN!!! TAKE OFF THE PATCH BEHIND YOUR EAR WITH A TISSUE ON 07/30/15.  Louisa HANDS AFTER REMOVAL.

## 2015-07-28 NOTE — Anesthesia Postprocedure Evaluation (Signed)
  Anesthesia Post-op Note  Patient: Roberta [name]  Procedure(s) Performed: Procedure(s): DILATATION & CURETTAGE/HYSTEROSCOPY WITH RESECTOCOPE (N/A)  Patient Location: PACU  Anesthesia Type:General  Level of Consciousness: awake  Airway and Oxygen Therapy: Patient Spontanous Breathing  Post-op Pain: mild  Post-op Assessment: Post-op Vital signs reviewed, Patient's Cardiovascular Status Stable, Respiratory Function Stable, Patent Airway and No signs of Nausea or vomiting              Post-op Vital Signs: Reviewed and stable  Last Vitals:  Filed Vitals:   07/28/15 1215  BP: 132/77  Pulse: 75  Temp:   Resp: 20    Complications: No apparent anesthesia complications

## 2015-07-28 NOTE — H&P (Signed)
Roberta [name] is an 59 y.o. female for further evaluation of focal atypical hyperplasia on endometrial biopsy done in workup of postmenopausal bleeding.  Pertinent Gynecological History: Menses: post-menopausal Bleeding: post menopausal bleeding Contraception: tubal ligation Blood transfusions: none Sexually transmitted diseases: no past history Previous GYN Procedures: DNC and tubal ligation  Last mammogram: normal Date: 2016 Last pap: normal Date: 2016 OB History: G4, P4   Menstrual History: Menarche age: 76  No LMP recorded. Patient is postmenopausal.    Past Medical History  Diagnosis Date  . Anemia   . Asthma   . Depression   . Hyperlipidemia   . Hypertension   . ALLERGIC RHINITIS 10/14/2010  . ASTHMA, INTERMITTENT 02/16/2007  . BACK PAIN, CHRONIC 03/25/2009  . CERVICAL RADICULOPATHY 10/16/2009  . DEPRESSIVE DISORDER NOT ELSEWHERE CLASSIFIED 03/25/2009  . Gastric polyp 09/09/2011    Patient has history of a biopsy approximately 3 years ago of having a gastric polyp has endoscopy as well as colonoscopy every 5 years .   Marland Kitchen GERD (gastroesophageal reflux disease) 09/09/2011  . Glaucoma     sees optho every 3 months, drops each night  . Postmenopausal vaginal bleeding 09/24/2014  . PONV (postoperative nausea and vomiting)   . Abnormal EKG     nonspecific ST and T-wave changes  . Family history of adverse reaction to anesthesia     "I think that my brother gets nasueous with anesthesia."     Past Surgical History  Procedure Laterality Date  . Cervical spine surgery    . Rotator cuff repair Right May 2014  . Cholecystectomy  1980  . Bunionectomy      bilateral and toe correction  . Knee arthroscopy    . Ankle surgery    . Breast cyst incision and drainage      under breast    Family History  Problem Relation Age of Onset  . Heart disease Mother     MI in 75s  . Lymphoma Mother     related to asbestos  . Alcoholism Father   . Cirrhosis Father     due to alcohol   . Asthma Father     Social History:  reports that she has never smoked. She does not have any smokeless tobacco history on file. She reports that she does not drink alcohol or use illicit drugs.  Allergies:  Allergies  Allergen Reactions  . Influenza Vaccines Anaphylaxis    Throat swelling reported after flu shot in the past (cannot verify with records but would not give)  . Budesonide Other (See Comments)    Throat infection per patient  . Prednisone Other (See Comments)    increased vaginal bleeding, able to tolerate low dose (40 mg)  . Codeine Nausea And Vomiting  . Morphine And Related Nausea And Vomiting    Prescriptions prior to admission  Medication Sig Dispense Refill Last Dose  . albuterol (PROAIR HFA) 108 (90 BASE) MCG/ACT inhaler Inhale 2 puffs into the lungs every 4 (four) hours as needed. 2 Inhaler 11 Past Month at Unknown time  . albuterol (PROVENTIL) (2.5 MG/3ML) 0.083% nebulizer solution Take 3 mLs (2.5 mg total) by nebulization every 6 (six) hours as needed for wheezing or shortness of breath. 75 mL 1 Past Month at Unknown time  . aspirin 81 MG tablet Take 81 mg by mouth daily.   Past Week at Unknown time  . hydrochlorothiazide (HYDRODIURIL) 25 MG tablet TAKE 1 TABLET BY MOUTH DAILY 90 tablet 0 07/27/2015 at  Unknown time  . lisinopril (PRINIVIL,ZESTRIL) 5 MG tablet Take 1 tablet (5 mg total) by mouth daily. 90 tablet 3 07/28/2015 at Unknown time  . Multiple Vitamins-Minerals (HAIR SKIN AND NAILS FORMULA) TABS Take by mouth.   Taking  . ranitidine (ZANTAC) 150 MG tablet Take 150 mg by mouth 2 (two) times daily.   07/28/2015 at Unknown time  . Cholecalciferol (VITAMIN D-3) 1000 UNITS CAPS Take 3 capsules by mouth daily.   Taking  . cyclobenzaprine (FLEXERIL) 5 MG tablet Take 5 mg by mouth 3 (three) times daily as needed for muscle spasms.   Taking  . ferrous sulfate 325 (65 FE) MG EC tablet Take 325 mg by mouth daily as needed.   Taking  . meclizine (ANTIVERT) 12.5 MG tablet  Take 1 tablet (12.5 mg total) by mouth 3 (three) times daily as needed for dizziness. 30 tablet 1 Taking  . Multiple Vitamin (MULTIVITAMIN) tablet Take 1 tablet by mouth daily.   Taking  . ondansetron (ZOFRAN) 4 MG tablet Take 1 tablet (4 mg total) by mouth every 8 (eight) hours as needed for nausea or vomiting. 20 tablet 0 Taking  . ranitidine (ZANTAC) 150 MG tablet Take 1 tablet (150 mg total) by mouth 2 (two) times daily. 60 tablet 1 Taking  . Respiratory Therapy Supplies (NEBULIZER/TUBING/MOUTHPIECE) KIT Use with nebulizer and albuterol prescription. 1 each 0 Taking  . traMADol (ULTRAM) 50 MG tablet Take 50 mg by mouth every 6 (six) hours as needed.     Taking  . Travoprost, BAK Free, (TRAVATAN) 0.004 % SOLN ophthalmic solution Place 1 drop into both eyes at bedtime. 2.5 mL 3 Taking    Review of Systems  Constitutional: Negative.   HENT: Negative.   Eyes: Negative.   Respiratory: Negative.   Cardiovascular: Negative.   Gastrointestinal: Negative.   Genitourinary: Negative.   Musculoskeletal: Negative.   Skin: Negative.   Neurological: Negative.   Endo/Heme/Allergies: Negative.   Psychiatric/Behavioral: Positive for depression.       No homicidal or suicidal ideation    Blood pressure 138/88, pulse 75, temperature 98.2 F (36.8 C), temperature source Oral, resp. rate 16, SpO2 98 %. Physical Exam  Constitutional: She is oriented to person, place, and time. She appears well-developed and well-nourished.  HENT:  Head: Normocephalic and atraumatic.  Eyes: EOM are normal.  Neck: Normal range of motion. Neck supple.  Cardiovascular: Normal rate and regular rhythm.   Respiratory: Effort normal and breath sounds normal.  GI: Soft. Bowel sounds are normal.  Genitourinary:  Pelvic exam:  VULVA: normal appearing vulva with no masses, tenderness or lesions,  VAGINA: normal appearing vagina with normal color and discharge, no lesions,  CERVIX: normal appearing cervix without discharge  or lesions,  UTERUS: , enlarged to 8-10 week's size,  ADNEXA: normal adnexa in size, nontender and no masses.  Musculoskeletal: Normal range of motion.  Neurological: She is alert and oriented to person, place, and time.  Skin: Skin is warm and dry.  Psychiatric: She has a normal mood and affect.   Endometrial biopsy:  Focal atypical hyperplasia  No results found for this or any previous visit (from the past 24 hour(s)).  No results found.  Assessment/Plan: The pt will undergo hysteroscopy and d&c for focal atypical hyperplasia. Indications, risks and benefits of operative hysteroscopy reviewed; including risks of anesthesioa, bleeding, infection, damage to adjacent organs and uterine perforation. pt acknowledges understanding and wishes to proceed  Makaia Rappa P 07/28/2015, 10:55 AM

## 2015-07-28 NOTE — Anesthesia Preprocedure Evaluation (Signed)
Anesthesia Evaluation  Patient identified by MRN, date of birth, ID band Patient awake    Reviewed: Allergy & Precautions, H&P , NPO status , Patient's Chart, lab work & pertinent test results  Airway Mallampati: II  TM Distance: >3 FB Neck ROM: full    Dental no notable dental hx. (+) Teeth Intact   Pulmonary  Will use inhaler prior to OR.   Pulmonary exam normal       Cardiovascular hypertension, Pt. on medications Normal cardiovascular exam    Neuro/Psych    GI/Hepatic Neg liver ROS, GERD-  Medicated and Controlled,  Endo/Other  negative endocrine ROSMorbid obesity  Renal/GU negative Renal ROS     Musculoskeletal   Abdominal (+) + obese,   Peds  Hematology   Anesthesia Other Findings   Reproductive/Obstetrics negative OB ROS                             Anesthesia Physical Anesthesia Plan  ASA: III  Anesthesia Plan: General   Post-op Pain Management:    Induction: Intravenous  Airway Management Planned: LMA  Additional Equipment:   Intra-op Plan:   Post-operative Plan:   Informed Consent: I have reviewed the patients History and Physical, chart, labs and discussed the procedure including the risks, benefits and alternatives for the proposed anesthesia with the patient or authorized representative who has indicated his/her understanding and acceptance.     Plan Discussed with: CRNA and Surgeon  Anesthesia Plan Comments:         Anesthesia Quick Evaluation

## 2015-07-29 ENCOUNTER — Encounter (HOSPITAL_COMMUNITY): Payer: Self-pay | Admitting: Obstetrics and Gynecology

## 2015-08-06 ENCOUNTER — Encounter: Payer: Self-pay | Admitting: Family Medicine

## 2015-08-11 ENCOUNTER — Ambulatory Visit: Payer: Medicare Other | Attending: Gynecologic Oncology | Admitting: Gynecologic Oncology

## 2015-08-11 ENCOUNTER — Encounter: Payer: Self-pay | Admitting: Gynecologic Oncology

## 2015-08-11 VITALS — BP 127/81 | HR 85 | Temp 98.1°F | Resp 18 | Ht 66.5 in | Wt 253.7 lb

## 2015-08-11 DIAGNOSIS — N8502 Endometrial intraepithelial neoplasia [EIN]: Secondary | ICD-10-CM | POA: Diagnosis not present

## 2015-08-11 DIAGNOSIS — C541 Malignant neoplasm of endometrium: Secondary | ICD-10-CM | POA: Diagnosis not present

## 2015-08-11 NOTE — Patient Instructions (Addendum)
We will reach out to [facility] to check first availability for surgery. We will contact you in the next business day to discuss your surgery date. Please call us with any updates.                 Preparing for your Surgery  Plan for surgery on September 09, 2015 with Dr. Denman George.  Pre-operative Testing -You will receive a phone call from presurgical testing at [facility] to arrange for a pre-operative testing appointment before your surgery.  This appointment normally occurs one to two weeks before your scheduled surgery.   -Bring your insurance card, copy of an advanced directive if applicable, medication list  -At that visit, you will be asked to sign a consent for a possible blood transfusion in case a transfusion becomes necessary during surgery.  The need for a blood transfusion is rare but having consent is a necessary part of your care.     -You should not be taking blood thinners or aspirin at least ten days prior to surgery unless instructed by your surgeon.  Day Before Surgery at [facility] will be asked to take in only clear liquids the day before surgery.  Examples of clear liquids include broths, jello, and clear juices.  Avoid carbonated beverages.  You may also be advised to perform a Miralax bowel prep or fleets enema the night before your surgery based off of your provider's recommendations.  You will be advised to have nothing to eat or drink after midnight the evening before.    Your role in recovery Your role is to become active as soon as directed by your doctor, while still giving yourself time to heal.  Rest when you feel tired. You will be asked to do the following in order to speed your recovery:  - Cough and breathe deeply. This helps toclear and expand your lungs and can prevent pneumonia. You may be given a spirometer to practice deep breathing. A staff member will show you how to use the spirometer. - Do mild physical activity. Walking or moving your legs help your  circulation and body functions return to normal. A staff member will help you when you try to walk and will provide you with simple exercises. Do not try to get up or walk alone the first time. - Actively manage your pain. Managing your pain lets you move in comfort. We will ask you to rate your pain on a scale of zero to 10. It is your responsibility to tell your doctor or nurse where and how much you hurt so your pain can be treated.  Special Considerations -If you are diabetic, you may be placed on insulin after surgery to have closer control over your blood sugars to promote healing and recovery.  This does not mean that you will be discharged on insulin.  If applicable, your oral antidiabetics will be resumed when you are tolerating a solid diet.  -Your final pathology results from surgery should be available by the Friday after surgery and the results will be relayed to you when available.  Blood Transfusion Information WHAT IS A BLOOD TRANSFUSION? A transfusion is the replacement of blood or some of its parts. Blood is made up of multiple cells which provide different functions.  Red blood cells carry oxygen and are used for blood loss replacement.  White blood cells fight against infection.  Platelets control bleeding.  Plasma helps clot blood.  Other blood products are available for specialized needs,  such as hemophilia or other clotting disorders. BEFORE THE TRANSFUSION  Who gives blood for transfusions?   You may be able to donate blood to be used at a later date on yourself (autologous donation).  Relatives can be asked to donate blood. This is generally not any safer than if you have received blood from a stranger. The same precautions are taken to ensure safety when a relative's blood is donated.  Healthy volunteers who are fully evaluated to make sure their blood is safe. This is blood bank blood. Transfusion therapy is the safest it has ever been in the practice of  medicine. Before blood is taken from a donor, a complete history is taken to make sure that person has no history of diseases nor engages in risky social behavior (examples are intravenous drug use or sexual activity with multiple partners). The donor's travel history is screened to minimize risk of transmitting infections, such as malaria. The donated blood is tested for signs of infectious diseases, such as HIV and hepatitis. The blood is then tested to be sure it is compatible with you in order to minimize the chance of a transfusion reaction. If you or a relative donates blood, this is often done in anticipation of surgery and is not appropriate for emergency situations. It takes many days to process the donated blood. RISKS AND COMPLICATIONS Although transfusion therapy is very safe and saves many lives, the main dangers of transfusion include:   Getting an infectious disease.  Developing a transfusion reaction. This is an allergic reaction to something in the blood you were given. Every precaution is taken to prevent this. The decision to have a blood transfusion has been considered carefully by your caregiver before blood is given. Blood is not given unless the benefits outweigh the risks.

## 2015-08-11 NOTE — Progress Notes (Signed)
Consult Note: Gyn-Onc  Consult was requested by Dr. Leo Grosser for the evaluation of Roberta [name] 59 y.o. female  CC:  Chief Complaint  Patient presents with  . endometrial cancer    Assessment/Plan:  Roberta [name]  is a 59 y.o.  year old with CAH and possible endometrial cancer (well differentiated).   A detailed discussion was held with the patient and her family with regard to to her endometrial cancer diagnosis. We discussed the standard management options for uterine cancer which includes surgery followed possibly by adjuvant therapy depending on the results of surgery. The options for surgical management include a hysterectomy and removal of the tubes and ovaries possibly with removal of pelvic and para-aortic lymph nodes. A minimally invasive approach including a robotic hysterectomy or laparoscopic hysterectomy have benefits including shorter hospital stay, recovery time and better wound healing. The alternative approach is an open hysterectomy. The patient has been counseled about these surgical options and the risks of surgery in general including infection, bleeding, damage to surrounding structures (including bowel, bladder, ureters, nerves or vessels), and the postoperative risks of PE/ DVT, and lymphedema. I extensively reviewed the additional risks of robotic hysterectomy including possible need for conversion to open laparotomy.  I discussed positioning during surgery of trendelenberg and risks of minor facial swelling and care we take in preoperative positioning.  After counseling and consideration of her options, she desires to proceed with robotic hysterectomy, BSO, sentinel lymph node biopsy.   She will be seen by anesthesia for preoperative clearance and discussion of postoperative pain management.  She was given the opportunity to ask questions, which were answered to her satisfaction, and she is agreement with the above mentioned plan of care.   HPI: Roberta [name] is a 59 year old woman who is seen in consultation at the request of Dr Leo Grosser for a history of abnormal and post menopausal uterine bleeding. She underwent D&C sampling on 07/28/15 which showed CAH with a "suspicion" of low grade endometrial cancer.  The patient is morbidly obese and diabetic. Her past surgical history is significant for an open cholecystectomy.  Current Meds:  Outpatient Encounter Prescriptions as of 08/11/2015  Medication Sig  . albuterol (PROAIR HFA) 108 (90 BASE) MCG/ACT inhaler Inhale 2 puffs into the lungs every 4 (four) hours as needed.  Marland Kitchen albuterol (PROVENTIL) (2.5 MG/3ML) 0.083% nebulizer solution Take 3 mLs (2.5 mg total) by nebulization every 6 (six) hours as needed for wheezing or shortness of breath.  Marland Kitchen aspirin 81 MG tablet Take 81 mg by mouth daily.  Marland Kitchen BESIVANCE 0.6 % SUSP   . Cholecalciferol (VITAMIN D-3) 1000 UNITS CAPS Take 3 capsules by mouth daily.  . cyclobenzaprine (FLEXERIL) 5 MG tablet Take 5 mg by mouth 3 (three) times daily as needed for muscle spasms.  . cyclopentolate (CYCLODRYL,CYCLOGYL) 1 % ophthalmic solution   . dorzolamide (TRUSOPT) 2 % ophthalmic solution   . DUREZOL 0.05 % EMUL   . ferrous sulfate 325 (65 FE) MG EC tablet Take 325 mg by mouth daily as needed.  . hydrochlorothiazide (HYDRODIURIL) 25 MG tablet TAKE 1 TABLET BY MOUTH DAILY  . lisinopril (PRINIVIL,ZESTRIL) 5 MG tablet Take 1 tablet (5 mg total) by mouth daily.  . Multiple Vitamin (MULTIVITAMIN) tablet Take 1 tablet by mouth daily.  Marland Kitchen orlistat (ALLI) 60 MG capsule Take 60 mg by mouth 3 (three) times daily with meals.  . prednisoLONE acetate (PRED FORTE) 1 % ophthalmic suspension   . psyllium (METAMUCIL)  58.6 % powder Take 1 packet by mouth 3 (three) times daily.  . ranitidine (ZANTAC) 150 MG tablet Take 1 tablet (150 mg total) by mouth 2 (two) times daily.  . traMADol (ULTRAM) 50 MG tablet Take 50 mg by mouth every 6 (six) hours as needed.    Marland Kitchen ibuprofen (ADVIL,MOTRIN) 600  MG tablet Ibuprofen 600 mg orally every 6 hours for 3 days and then every 6 hours as needed for pain (Patient not taking: Reported on 08/11/2015)  . meclizine (ANTIVERT) 12.5 MG tablet Take 1 tablet (12.5 mg total) by mouth 3 (three) times daily as needed for dizziness. (Patient not taking: Reported on 08/11/2015)  . NEXIUM 20 MG capsule   . ondansetron (ZOFRAN) 4 MG tablet Take 1 tablet (4 mg total) by mouth every 8 (eight) hours as needed for nausea or vomiting. (Patient not taking: Reported on 08/11/2015)  . polyethylene glycol-electrolytes (NULYTELY/GOLYTELY) 420 G solution   . predniSONE (DELTASONE) 20 MG tablet   . Travoprost, BAK Free, (TRAVATAN) 0.004 % SOLN ophthalmic solution Place 1 drop into both eyes at bedtime. (Patient not taking: Reported on 08/11/2015)   No facility-administered encounter medications on file as of 08/11/2015.    Allergy:  Allergies  Allergen Reactions  . Influenza Vaccines Anaphylaxis    Throat swelling reported after flu shot in the past (cannot verify with records but would not give)  . Budesonide Other (See Comments)    Throat infection per patient  . Prednisone Other (See Comments)    increased vaginal bleeding, able to tolerate low dose (40 mg)  . Codeine Nausea And Vomiting  . Morphine And Related Nausea And Vomiting    Social Hx:   Social History   Social History  . Marital Status: Divorced    Spouse Name: N/A  . Number of Children: 4  . Years of Education: 134   Occupational History  . DISABLED   . Previously a CNA    Social History Main Topics  . Smoking status: Never Smoker   . Smokeless tobacco: Not on file  . Alcohol Use: No  . Drug Use: No  . Sexual Activity: No   Other Topics Concern  . 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 Part time student due to learning disability. 4 kids.       On disability.                     Past Surgical Hx:  Past Surgical History  Procedure Laterality Date  . Cervical spine surgery    . Rotator cuff repair Right May 2014  . Cholecystectomy  1980  . Bunionectomy      bilateral and toe correction  . Knee arthroscopy    . Ankle surgery    . Breast cyst incision and drainage      under breast  . Dilatation & currettage/hysteroscopy with resectocope N/A 07/28/2015    Procedure: [place];  Surgeon: Eldred Manges, MD;  Location: [facility];  Service: Gynecology;  Laterality: N/A;    Past Medical Hx:  Past Medical History  Diagnosis Date  . Anemia   . Asthma   . Depression   . Hyperlipidemia   . Hypertension   . ALLERGIC RHINITIS 10/14/2010  . ASTHMA, INTERMITTENT 02/16/2007  . BACK PAIN, CHRONIC 03/25/2009  . CERVICAL RADICULOPATHY 10/16/2009  .  DEPRESSIVE DISORDER NOT ELSEWHERE CLASSIFIED 03/25/2009  . Gastric polyp 09/09/2011    Patient has history of a biopsy approximately 3 years ago of having a gastric polyp has endoscopy as well as colonoscopy every 5 years .   Marland Kitchen GERD (gastroesophageal reflux disease) 09/09/2011  . Glaucoma     sees optho every 3 months, drops each night  . Postmenopausal vaginal bleeding 09/24/2014  . PONV (postoperative nausea and vomiting)   . Abnormal EKG     nonspecific ST and T-wave changes  . Family history of adverse reaction to anesthesia     "I think that my brother gets nasueous with anesthesia."     Past Gynecological History:  Uterine fibroid  No LMP recorded. Patient is postmenopausal.  Family Hx:  Family History  Problem Relation Age of Onset  . Heart disease Mother     MI in 18s  . Lymphoma Mother     related to asbestos  . Cancer Mother     lymphoma (asbestos exposure)  . Alcoholism Father   . Cirrhosis Father     due to alcohol  . Asthma Father   . Cancer Maternal Uncle     lung  . Cancer Maternal Uncle     lung    Review of  Systems:  Constitutional  Feels well,    ENT Normal appearing ears and nares bilaterally Skin/Breast  No rash, sores, jaundice, itching, dryness Cardiovascular  No chest pain, shortness of breath, or edema  Pulmonary  No cough or wheeze.  Gastro Intestinal  No nausea, vomitting, or diarrhoea. No bright red blood per rectum, no abdominal pain, change in bowel movement, or constipation.  Genito Urinary  No frequency, urgency, dysuria, see HPI Musculo Skeletal  No myalgia, arthralgia, joint swelling or pain  Neurologic  No weakness, numbness, change in gait,  Psychology  No depression, anxiety, insomnia.   Vitals:  Blood pressure 127/81, pulse 85, temperature 98.1 F (36.7 C), temperature source Oral, resp. rate 18, height 5' 6.5" (1.689 m), weight 253 lb 11.2 oz (115.078 kg), SpO2 99 %.  Physical Exam: WD in NAD Neck  Supple NROM, without any enlargements.  Lymph Node Survey No cervical supraclavicular or inguinal adenopathy Cardiovascular  Pulse normal rate, regularity and rhythm. S1 and S2 normal.  Lungs  Clear to auscultation bilateraly, without wheezes/crackles/rhonchi. Good air movement.  Skin  No rash/lesions/breakdown  Psychiatry  Alert and oriented to person, place, and time  Abdomen  Normoactive bowel sounds, abdomen soft, non-tender and obese without evidence of hernia.  Back No CVA tenderness Genito Urinary  Vulva/vagina: Normal external female genitalia.  No lesions. No discharge or bleeding.  Bladder/urethra:  No lesions or masses, well supported bladder  Vagina: normal  Cervix: Normal appearing, no lesions.  Uterus: Small, mobile, no parametrial involvement or nodularity.  Adnexa: no palpable masses. Rectal  Good tone, no masses no cul de sac nodularity.  Extremities  No bilateral cyanosis, clubbing or edema.   [name], MD  08/11/2015, 2:28 PM

## 2015-08-12 ENCOUNTER — Telehealth: Payer: Self-pay | Admitting: *Deleted

## 2015-08-12 DIAGNOSIS — R3915 Urgency of urination: Secondary | ICD-10-CM | POA: Diagnosis not present

## 2015-08-12 DIAGNOSIS — N8500 Endometrial hyperplasia, unspecified: Secondary | ICD-10-CM | POA: Diagnosis not present

## 2015-08-12 NOTE — Telephone Encounter (Signed)
Received phone call from patient stating she has decided to proceed with surgery 09/09/15 in [place] with Dr. Denman George instead of having surgery at [facility]. Told patient that we will schedule her surgery for 9/20 and reminded her she will receive a phone call from presurgical testing with a pre-op appointment 1-2 weeks prior to surgery. Instructed patient to call our office back with any questions or concerns prior to surgery - patient agreeable to this.

## 2015-08-14 ENCOUNTER — Telehealth: Payer: Self-pay | Admitting: Nurse Practitioner

## 2015-08-14 NOTE — Telephone Encounter (Signed)
Per Joylene John, NP, Rn offered patient 09/04/15 surgical date instead of 09/09/15. Patient elects to move procedure to earlier date and requests 10:30 due to transportation. Rn also instructed patient per NP to stop aspirin 10 days before surgery; last dose to be 08/25/15.   Patient inquiring about home health nurse after surgery, stating "I qualify for a home health aid" and has used [facility] in the past. Will follow up on this request.

## 2015-08-26 DIAGNOSIS — M542 Cervicalgia: Secondary | ICD-10-CM | POA: Diagnosis not present

## 2015-09-01 NOTE — Patient Instructions (Addendum)
YOUR PROCEDURE IS SCHEDULED ON :  09/04/15  REPORT TO [place] MAIN ENTRANCE FOLLOW SIGNS TO EAST ELEVATOR - GO TO 3rd FLOOR CHECK IN AT 3 EAST NURSES STATION (SHORT STAY) AT: 8:15 AM  CALL THIS NUMBER IF YOU HAVE PROBLEMS THE MORNING OF SURGERY [phone]  REMEMBER:ONLY 1 PER PERSON MAY GO TO SHORT STAY WITH YOU TO GET READY THE MORNING OF YOUR SURGERY  DO NOT EAT FOOD OR DRINK LIQUIDS AFTER MIDNIGHT  CLEAR LIQUIDS ONLY THE DAY BEFORE SURGERY  TAKE THESE MEDICINES THE MORNING OF SURGERY: ZANTAC  CLEAR LIQUID DIET  Foods Allowed                                                                     Foods Excluded  Coffee and tea, regular and decaf                             liquids that you cannot  Plain Jell-O in any flavor                                             see through such as: Fruit ices (not with fruit pulp)                                     milk, soups, orange juice  Iced Popsicles                                                 All solid food Carbonated beverages, regular and diet                                    Cranberry, grape and apple juices Sports drinks like Gatorade Lightly seasoned clear broth or consume(fat free) Sugar, honey syrup   _____________________________________________________________________    YOU MAY NOT HAVE ANY METAL ON YOUR BODY INCLUDING HAIR PINS AND PIERCING'S. DO NOT WEAR JEWELRY, MAKEUP, LOTIONS, POWDERS OR PERFUMES. DO NOT WEAR NAIL POLISH. DO NOT SHAVE 48 HRS PRIOR TO SURGERY. MEN MAY SHAVE FACE AND NECK.  DO NOT [place]. [facility] IS NOT RESPONSIBLE FOR VALUABLES.  CONTACTS, DENTURES OR PARTIALS MAY NOT BE WORN TO SURGERY. LEAVE SUITCASE IN CAR. CAN BE BROUGHT TO ROOM AFTER SURGERY.  PATIENTS DISCHARGED THE DAY OF SURGERY WILL NOT BE ALLOWED TO DRIVE HOME.  PLEASE READ OVER THE FOLLOWING INSTRUCTION  SHEETS _________________________________________________________________________________                                          [facility] - PREPARING FOR SURGERY  Before surgery, you can play an important role.  Because skin is not sterile,  your skin needs to be as free of germs as possible.  You can reduce the number of germs on your skin by washing with CHG (chlorahexidine gluconate) soap before surgery.  CHG is an antiseptic cleaner which kills germs and bonds with the skin to continue killing germs even after washing. Please DO NOT use if you have an allergy to CHG or antibacterial soaps.  If your skin becomes reddened/irritated stop using the CHG and inform your nurse when you arrive at Short Stay. Do not shave (including legs and underarms) for at least 48 hours prior to the first CHG shower.  You may shave your face. Please follow these instructions carefully:   1.  Shower with CHG Soap the night before surgery and the  morning of Surgery.   2.  If you choose to wash your hair, wash your hair first as usual with your  normal  Shampoo.   3.  After you shampoo, rinse your hair and body thoroughly to remove the  shampoo.                                         4.  Use CHG as you would any other liquid soap.  You can apply chg directly  to the skin and wash . Gently wash with scrungie or clean wascloth    5.  Apply the CHG Soap to your body ONLY FROM THE NECK DOWN.   Do not use on open                           Wound or open sores. Avoid contact with eyes, ears mouth and genitals (private parts).                        Genitals (private parts) with your normal soap.              6.  Wash thoroughly, paying special attention to the area where your surgery  will be performed.   7.  Thoroughly rinse your body with warm water from the neck down.   8.  DO NOT shower/wash with your normal soap after using and rinsing off  the CHG Soap .                9.  Pat yourself dry with a clean  towel.             10.  Wear clean night clothes to bed after shower             11.  Place clean sheets on your bed the night of your first shower and do not  sleep with pets.  Day of Surgery : Do not apply any lotions/deodorants the morning of surgery.  Please wear clean clothes to the hospital/surgery center.  FAILURE TO FOLLOW THESE INSTRUCTIONS MAY RESULT IN THE CANCELLATION OF YOUR SURGERY    PATIENT SIGNATURE_________________________________  ______________________________________________________________________     Roberta [name]  An incentive spirometer is a tool that can help keep your lungs clear and active. This tool measures how well you are filling your lungs with each breath. Taking long deep breaths may help reverse or decrease the chance of developing breathing (pulmonary) problems (especially infection) following:  A long period of time when you are unable to move or be active. BEFORE THE  PROCEDURE   If the spirometer includes an indicator to show your best effort, your nurse or respiratory therapist will set it to a desired goal.  If possible, sit up straight or lean slightly forward. Try not to slouch.  Hold the incentive spirometer in an upright position. INSTRUCTIONS FOR USE   Sit on the edge of your bed if possible, or sit up as far as you can in bed or on a chair.  Hold the incentive spirometer in an upright position.  Breathe out normally.  Place the mouthpiece in your mouth and seal your lips tightly around it.  Breathe in slowly and as deeply as possible, raising the piston or the ball toward the top of the column.  Hold your breath for 3-5 seconds or for as long as possible. Allow the piston or ball to fall to the bottom of the column.  Remove the mouthpiece from your mouth and breathe out normally.  Rest for a few seconds and repeat Steps 1 through 7 at least 10 times every 1-2 hours when you are awake. Take your time and take a few  normal breaths between deep breaths.  The spirometer may include an indicator to show your best effort. Use the indicator as a goal to work toward during each repetition.  After each set of 10 deep breaths, practice coughing to be sure your lungs are clear. If you have an incision (the cut made at the time of surgery), support your incision when coughing by placing a pillow or rolled up towels firmly against it. Once you are able to get out of bed, walk around indoors and cough well. You may stop using the incentive spirometer when instructed by your caregiver.  RISKS AND COMPLICATIONS  Take your time so you do not get dizzy or light-headed.  If you are in pain, you may need to take or ask for pain medication before doing incentive spirometry. It is harder to take a deep breath if you are having pain. AFTER USE  Rest and breathe slowly and easily.  It can be helpful to keep track of a log of your progress. Your caregiver can provide you with a simple table to help with this. If you are using the spirometer at home, follow these instructions: [place] IF:   You are having difficultly using the spirometer.  You have trouble using the spirometer as often as instructed.  Your pain medication is not giving enough relief while using the spirometer.  You develop fever of 100.5 F (38.1 C) or higher. SEEK IMMEDIATE MEDICAL CARE IF:   You cough up bloody sputum that had not been present before.  You develop fever of 102 F (38.9 C) or greater.  You develop worsening pain at or near the incision site. MAKE SURE YOU:   Understand these instructions.  Will watch your condition.  Will get help right away if you are not doing well or get worse. Document Released: 04/18/2007 Document Revised: 02/28/2012 Document Reviewed: 06/19/2007 ExitCare Patient Information 2014 ExitCare, [place].   ________________________________________________________________________  WHAT IS A BLOOD  TRANSFUSION? Blood Transfusion Information  A transfusion is the replacement of blood or some of its parts. Blood is made up of multiple cells which provide different functions.  Red blood cells carry oxygen and are used for blood loss replacement.  White blood cells fight against infection.  Platelets control bleeding.  Plasma helps clot blood.  Other blood products are available for specialized needs, such as hemophilia  or other clotting disorders. BEFORE THE TRANSFUSION  Who gives blood for transfusions?   Healthy volunteers who are fully evaluated to make sure their blood is safe. This is blood bank blood. Transfusion therapy is the safest it has ever been in the practice of medicine. Before blood is taken from a donor, a complete history is taken to make sure that person has no history of diseases nor engages in risky social behavior (examples are intravenous drug use or sexual activity with multiple partners). The donor's travel history is screened to minimize risk of transmitting infections, such as malaria. The donated blood is tested for signs of infectious diseases, such as HIV and hepatitis. The blood is then tested to be sure it is compatible with you in order to minimize the chance of a transfusion reaction. If you or a relative donates blood, this is often done in anticipation of surgery and is not appropriate for emergency situations. It takes many days to process the donated blood. RISKS AND COMPLICATIONS Although transfusion therapy is very safe and saves many lives, the main dangers of transfusion include:   Getting an infectious disease.  Developing a transfusion reaction. This is an allergic reaction to something in the blood you were given. Every precaution is taken to prevent this. The decision to have a blood transfusion has been considered carefully by your caregiver before blood is given. Blood is not given unless the benefits outweigh the risks. AFTER THE  TRANSFUSION  Right after receiving a blood transfusion, you will usually feel much better and more energetic. This is especially true if your red blood cells have gotten low (anemic). The transfusion raises the level of the red blood cells which carry oxygen, and this usually causes an energy increase.  The nurse administering the transfusion will monitor you carefully for complications. HOME CARE INSTRUCTIONS  No special instructions are needed after a transfusion. You may find your energy is better. Speak with your caregiver about any limitations on activity for underlying diseases you may have. SEEK MEDICAL CARE IF:   Your condition is not improving after your transfusion.  You develop redness or irritation at the intravenous (IV) site. SEEK IMMEDIATE MEDICAL CARE IF:  Any of the following symptoms occur over the next 12 hours:  Shaking chills.  You have a temperature by mouth above 102 F (38.9 C), not controlled by medicine.  Chest, back, or muscle pain.  People around you feel you are not acting correctly or are confused.  Shortness of breath or difficulty breathing.  Dizziness and fainting.  You get a rash or develop hives.  You have a decrease in urine output.  Your urine turns a dark color or changes to pink, red, or brown. Any of the following symptoms occur over the next 10 days:  You have a temperature by mouth above 102 F (38.9 C), not controlled by medicine.  Shortness of breath.  Weakness after normal activity.  The white part of the eye turns yellow (jaundice).  You have a decrease in the amount of urine or are urinating less often.  Your urine turns a dark color or changes to pink, red, or brown. Document Released: 12/03/2000 Document Revised: 02/28/2012 Document Reviewed: 07/22/2008 [facility] Patient Information 2014 [place], [place].  _______________________________________________________________________

## 2015-09-02 ENCOUNTER — Ambulatory Visit (HOSPITAL_COMMUNITY)
Admission: RE | Admit: 2015-09-02 | Discharge: 2015-09-02 | Disposition: A | Discharge status: Home / Self Care | Payer: Medicare Other | Source: Ambulatory Visit | Attending: Gynecologic Oncology | Admitting: Gynecologic Oncology

## 2015-09-02 ENCOUNTER — Encounter (HOSPITAL_COMMUNITY)
Admission: RE | Admit: 2015-09-02 | Discharge: 2015-09-02 | Disposition: A | Discharge status: Home / Self Care | Payer: Medicare Other | Source: Ambulatory Visit | Attending: Gynecologic Oncology | Admitting: Gynecologic Oncology

## 2015-09-02 ENCOUNTER — Encounter (HOSPITAL_COMMUNITY): Payer: Self-pay

## 2015-09-02 DIAGNOSIS — C541 Malignant neoplasm of endometrium: Secondary | ICD-10-CM | POA: Diagnosis not present

## 2015-09-02 DIAGNOSIS — I517 Cardiomegaly: Secondary | ICD-10-CM | POA: Diagnosis not present

## 2015-09-02 DIAGNOSIS — Z01818 Encounter for other preprocedural examination: Secondary | ICD-10-CM | POA: Insufficient documentation

## 2015-09-02 HISTORY — DX: Malignant neoplasm of endometrium: C54.1

## 2015-09-02 HISTORY — DX: Other intervertebral disc degeneration, lumbosacral region: M51.37

## 2015-09-02 HISTORY — DX: Other intervertebral disc degeneration, lumbosacral region without mention of lumbar back pain or lower extremity pain: M51.379

## 2015-09-02 LAB — URINALYSIS, ROUTINE W REFLEX MICROSCOPIC
Bilirubin Urine: NEGATIVE
GLUCOSE, UA: NEGATIVE mg/dL
KETONES UR: NEGATIVE mg/dL
LEUKOCYTES UA: NEGATIVE
Nitrite: NEGATIVE
PH: 5 (ref 5.0–8.0)
PROTEIN: NEGATIVE mg/dL
Specific Gravity, Urine: 1.01 (ref 1.005–1.030)
Urobilinogen, UA: 0.2 mg/dL (ref 0.0–1.0)

## 2015-09-02 LAB — COMPREHENSIVE METABOLIC PANEL
ALBUMIN: 3.6 g/dL (ref 3.5–5.0)
ALK PHOS: 73 U/L (ref 38–126)
ALT: 17 U/L (ref 14–54)
AST: 20 U/L (ref 15–41)
Anion gap: 6 (ref 5–15)
BUN: 17 mg/dL (ref 6–20)
CALCIUM: 8.8 mg/dL — AB (ref 8.9–10.3)
CO2: 29 mmol/L (ref 22–32)
Chloride: 105 mmol/L (ref 101–111)
Creatinine, Ser: 0.98 mg/dL (ref 0.44–1.00)
GLUCOSE: 109 mg/dL — AB (ref 65–99)
POTASSIUM: 3.4 mmol/L — AB (ref 3.5–5.1)
Sodium: 140 mmol/L (ref 135–145)
Total Bilirubin: 0.5 mg/dL (ref 0.3–1.2)
Total Protein: 7.4 g/dL (ref 6.5–8.1)

## 2015-09-02 LAB — ABO/RH: ABO/RH(D): B POS

## 2015-09-02 LAB — CBC
HEMATOCRIT: 38.1 % (ref 36.0–46.0)
HEMOGLOBIN: 11.7 g/dL — AB (ref 12.0–15.0)
MCH: 26.1 pg (ref 26.0–34.0)
MCHC: 30.7 g/dL (ref 30.0–36.0)
MCV: 84.9 fL (ref 78.0–100.0)
Platelets: 322 10*3/uL (ref 150–400)
RBC: 4.49 MIL/uL (ref 3.87–5.11)
RDW: 14.4 % (ref 11.5–15.5)
WBC: 6.3 10*3/uL (ref 4.0–10.5)

## 2015-09-02 LAB — URINE MICROSCOPIC-ADD ON

## 2015-09-04 ENCOUNTER — Ambulatory Visit (HOSPITAL_COMMUNITY): Payer: Medicare Other | Admitting: Certified Registered Nurse Anesthetist

## 2015-09-04 ENCOUNTER — Encounter (HOSPITAL_COMMUNITY)
Admission: RE | Disposition: A | Discharge status: Home / Self Care | Payer: Self-pay | Source: Ambulatory Visit | Attending: Gynecologic Oncology

## 2015-09-04 ENCOUNTER — Ambulatory Visit (HOSPITAL_COMMUNITY)
Admission: RE | Admit: 2015-09-04 | Discharge: 2015-09-05 | Disposition: A | Discharge status: Home / Self Care | Payer: Medicare Other | Source: Ambulatory Visit | Attending: Gynecologic Oncology | Admitting: Gynecologic Oncology

## 2015-09-04 ENCOUNTER — Encounter (HOSPITAL_COMMUNITY): Payer: Self-pay | Admitting: *Deleted

## 2015-09-04 DIAGNOSIS — J45909 Unspecified asthma, uncomplicated: Secondary | ICD-10-CM | POA: Diagnosis not present

## 2015-09-04 DIAGNOSIS — M1990 Unspecified osteoarthritis, unspecified site: Secondary | ICD-10-CM | POA: Insufficient documentation

## 2015-09-04 DIAGNOSIS — G8929 Other chronic pain: Secondary | ICD-10-CM | POA: Diagnosis not present

## 2015-09-04 DIAGNOSIS — H409 Unspecified glaucoma: Secondary | ICD-10-CM | POA: Insufficient documentation

## 2015-09-04 DIAGNOSIS — E119 Type 2 diabetes mellitus without complications: Secondary | ICD-10-CM | POA: Diagnosis not present

## 2015-09-04 DIAGNOSIS — Z79899 Other long term (current) drug therapy: Secondary | ICD-10-CM | POA: Diagnosis not present

## 2015-09-04 DIAGNOSIS — N72 Inflammatory disease of cervix uteri: Secondary | ICD-10-CM | POA: Insufficient documentation

## 2015-09-04 DIAGNOSIS — C541 Malignant neoplasm of endometrium: Secondary | ICD-10-CM | POA: Diagnosis present

## 2015-09-04 DIAGNOSIS — K219 Gastro-esophageal reflux disease without esophagitis: Secondary | ICD-10-CM | POA: Diagnosis not present

## 2015-09-04 DIAGNOSIS — E785 Hyperlipidemia, unspecified: Secondary | ICD-10-CM | POA: Diagnosis not present

## 2015-09-04 DIAGNOSIS — Z7952 Long term (current) use of systemic steroids: Secondary | ICD-10-CM | POA: Insufficient documentation

## 2015-09-04 DIAGNOSIS — Z7982 Long term (current) use of aspirin: Secondary | ICD-10-CM | POA: Diagnosis not present

## 2015-09-04 DIAGNOSIS — I10 Essential (primary) hypertension: Secondary | ICD-10-CM | POA: Insufficient documentation

## 2015-09-04 DIAGNOSIS — D259 Leiomyoma of uterus, unspecified: Secondary | ICD-10-CM | POA: Diagnosis not present

## 2015-09-04 DIAGNOSIS — E669 Obesity, unspecified: Secondary | ICD-10-CM | POA: Diagnosis not present

## 2015-09-04 DIAGNOSIS — Z6841 Body Mass Index (BMI) 40.0 and over, adult: Secondary | ICD-10-CM | POA: Diagnosis not present

## 2015-09-04 DIAGNOSIS — M549 Dorsalgia, unspecified: Secondary | ICD-10-CM | POA: Diagnosis not present

## 2015-09-04 DIAGNOSIS — R188 Other ascites: Secondary | ICD-10-CM | POA: Diagnosis not present

## 2015-09-04 HISTORY — PX: ROBOTIC ASSISTED TOTAL HYSTERECTOMY WITH BILATERAL SALPINGO OOPHERECTOMY: SHX6086

## 2015-09-04 LAB — TYPE AND SCREEN
ABO/RH(D): B POS
ANTIBODY SCREEN: NEGATIVE

## 2015-09-04 SURGERY — ROBOTIC ASSISTED TOTAL HYSTERECTOMY WITH BILATERAL SALPINGO OOPHORECTOMY
Anesthesia: General | Laterality: Bilateral

## 2015-09-04 MED ORDER — DEXAMETHASONE SODIUM PHOSPHATE 10 MG/ML IJ SOLN
INTRAMUSCULAR | Status: DC | PRN
  Administered 2015-09-04: 10 mg via INTRAVENOUS

## 2015-09-04 MED ORDER — DORZOLAMIDE HCL 2 % OP SOLN
1.0000 [drp] | Freq: Two times a day (BID) | OPHTHALMIC | Status: DC
  Administered 2015-09-04 – 2015-09-05 (×2): 1 [drp] via OPHTHALMIC
  Filled 2015-09-04: qty 10

## 2015-09-04 MED ORDER — HYDROMORPHONE HCL 1 MG/ML IJ SOLN
0.5000 mg | INTRAMUSCULAR | Status: DC | PRN
  Administered 2015-09-04: 0.5 mg via INTRAVENOUS
  Filled 2015-09-04: qty 1

## 2015-09-04 MED ORDER — ROCURONIUM BROMIDE 100 MG/10ML IV SOLN
INTRAVENOUS | Status: AC
  Filled 2015-09-04: qty 1

## 2015-09-04 MED ORDER — PROMETHAZINE HCL 25 MG/ML IJ SOLN
6.2500 mg | INTRAMUSCULAR | Status: AC | PRN
  Administered 2015-09-04 (×2): 6.25 mg via INTRAVENOUS

## 2015-09-04 MED ORDER — LATANOPROST 0.005 % OP SOLN
1.0000 [drp] | Freq: Every day | OPHTHALMIC | Status: DC
  Administered 2015-09-04: 1 [drp] via OPHTHALMIC
  Filled 2015-09-04: qty 2.5

## 2015-09-04 MED ORDER — PROMETHAZINE HCL 25 MG/ML IJ SOLN
INTRAMUSCULAR | Status: AC
  Filled 2015-09-04: qty 1

## 2015-09-04 MED ORDER — NEOSTIGMINE METHYLSULFATE 10 MG/10ML IV SOLN
INTRAVENOUS | Status: DC | PRN
  Administered 2015-09-04: 5 mg via INTRAVENOUS

## 2015-09-04 MED ORDER — LACTATED RINGERS IV SOLN
INTRAVENOUS | Status: DC
  Administered 2015-09-04: 1000 mL via INTRAVENOUS
  Administered 2015-09-04: 13:00:00 via INTRAVENOUS

## 2015-09-04 MED ORDER — ONDANSETRON HCL 4 MG/2ML IJ SOLN
INTRAMUSCULAR | Status: AC
  Filled 2015-09-04: qty 2

## 2015-09-04 MED ORDER — FENTANYL CITRATE (PF) 100 MCG/2ML IJ SOLN
INTRAMUSCULAR | Status: DC | PRN
  Administered 2015-09-04 (×7): 50 ug via INTRAVENOUS

## 2015-09-04 MED ORDER — CEFAZOLIN SODIUM-DEXTROSE 2-3 GM-% IV SOLR
2.0000 g | INTRAVENOUS | Status: AC
  Administered 2015-09-04: 2 g via INTRAVENOUS

## 2015-09-04 MED ORDER — TRAMADOL HCL 50 MG PO TABS: 100.0000 mg | ORAL_TABLET | Freq: Four times a day (QID) | ORAL | Status: DC | PRN

## 2015-09-04 MED ORDER — FENTANYL CITRATE (PF) 250 MCG/5ML IJ SOLN
INTRAMUSCULAR | Status: AC
  Filled 2015-09-04: qty 25

## 2015-09-04 MED ORDER — LACTATED RINGERS IV SOLN
INTRAVENOUS | Status: DC | PRN
  Administered 2015-09-04: 1000 mL

## 2015-09-04 MED ORDER — FENTANYL CITRATE (PF) 100 MCG/2ML IJ SOLN
INTRAMUSCULAR | Status: AC
  Filled 2015-09-04: qty 4

## 2015-09-04 MED ORDER — HYDROMORPHONE HCL 2 MG/ML IJ SOLN
INTRAMUSCULAR | Status: AC
  Filled 2015-09-04: qty 1

## 2015-09-04 MED ORDER — ROCURONIUM BROMIDE 100 MG/10ML IV SOLN
INTRAVENOUS | Status: DC | PRN
  Administered 2015-09-04: 50 mg via INTRAVENOUS

## 2015-09-04 MED ORDER — MIDAZOLAM HCL 2 MG/2ML IJ SOLN
INTRAMUSCULAR | Status: AC
  Filled 2015-09-04: qty 4

## 2015-09-04 MED ORDER — KCL IN DEXTROSE-NACL 20-5-0.45 MEQ/L-%-% IV SOLN
INTRAVENOUS | Status: DC
Start: 2015-09-04 — End: 2015-09-05
  Administered 2015-09-04: 16:00:00 via INTRAVENOUS
  Filled 2015-09-04 (×2): qty 1000

## 2015-09-04 MED ORDER — KETOROLAC TROMETHAMINE 0.5 % OP SOLN
1.0000 [drp] | Freq: Three times a day (TID) | OPHTHALMIC | Status: DC
  Administered 2015-09-04 – 2015-09-05 (×3): 1 [drp] via OPHTHALMIC
  Filled 2015-09-04: qty 3

## 2015-09-04 MED ORDER — STERILE WATER FOR INJECTION IJ SOLN
INTRAMUSCULAR | Status: AC
  Filled 2015-09-04: qty 10

## 2015-09-04 MED ORDER — CETYLPYRIDINIUM CHLORIDE 0.05 % MT LIQD
7.0000 mL | Freq: Two times a day (BID) | OROMUCOSAL | Status: DC
  Administered 2015-09-04: 7 mL via OROMUCOSAL

## 2015-09-04 MED ORDER — PREDNISOLONE ACETATE 1 % OP SUSP
1.0000 [drp] | Freq: Three times a day (TID) | OPHTHALMIC | Status: DC
  Administered 2015-09-04 – 2015-09-05 (×3): 1 [drp] via OPHTHALMIC
  Filled 2015-09-04: qty 1

## 2015-09-04 MED ORDER — MIDAZOLAM HCL 5 MG/5ML IJ SOLN
INTRAMUSCULAR | Status: DC | PRN
Start: 2015-09-04 — End: 2015-09-04
  Administered 2015-09-04: 2 mg via INTRAVENOUS

## 2015-09-04 MED ORDER — LIDOCAINE HCL (CARDIAC) 20 MG/ML IV SOLN
INTRAVENOUS | Status: DC | PRN
  Administered 2015-09-04: 100 mg via INTRAVENOUS

## 2015-09-04 MED ORDER — LIDOCAINE HCL (CARDIAC) 20 MG/ML IV SOLN
INTRAVENOUS | Status: AC
  Filled 2015-09-04: qty 5

## 2015-09-04 MED ORDER — HYDROMORPHONE HCL 1 MG/ML IJ SOLN: 0.2500 mg | INTRAMUSCULAR | Status: DC | PRN

## 2015-09-04 MED ORDER — ALBUTEROL SULFATE HFA 108 (90 BASE) MCG/ACT IN AERS: 2.0000 | INHALATION_SPRAY | RESPIRATORY_TRACT | Status: DC | PRN

## 2015-09-04 MED ORDER — SUCCINYLCHOLINE CHLORIDE 20 MG/ML IJ SOLN
INTRAMUSCULAR | Status: DC | PRN
  Administered 2015-09-04: 130 mg via INTRAVENOUS

## 2015-09-04 MED ORDER — ENOXAPARIN SODIUM 40 MG/0.4ML ~~LOC~~ SOLN
40.0000 mg | SUBCUTANEOUS | Status: DC
  Filled 2015-09-04: qty 0.4

## 2015-09-04 MED ORDER — CEFAZOLIN SODIUM-DEXTROSE 2-3 GM-% IV SOLR
INTRAVENOUS | Status: AC
  Filled 2015-09-04: qty 50

## 2015-09-04 MED ORDER — PROPOFOL 10 MG/ML IV BOLUS
INTRAVENOUS | Status: AC
  Filled 2015-09-04: qty 20

## 2015-09-04 MED ORDER — ALBUTEROL SULFATE (2.5 MG/3ML) 0.083% IN NEBU: 2.5000 mg | INHALATION_SOLUTION | Freq: Four times a day (QID) | RESPIRATORY_TRACT | Status: DC | PRN

## 2015-09-04 MED ORDER — ENOXAPARIN SODIUM 40 MG/0.4ML ~~LOC~~ SOLN
40.0000 mg | SUBCUTANEOUS | Status: DC
  Administered 2015-09-05: 40 mg via SUBCUTANEOUS
  Filled 2015-09-04 (×2): qty 0.4

## 2015-09-04 MED ORDER — OXYCODONE-ACETAMINOPHEN 5-325 MG PO TABS: 1.0000 | ORAL_TABLET | ORAL | Status: DC | PRN

## 2015-09-04 MED ORDER — ONDANSETRON HCL 4 MG PO TABS: 4.0000 mg | ORAL_TABLET | Freq: Four times a day (QID) | ORAL | Status: DC | PRN

## 2015-09-04 MED ORDER — GLYCOPYRROLATE 0.2 MG/ML IJ SOLN
INTRAMUSCULAR | Status: AC
  Filled 2015-09-04: qty 3

## 2015-09-04 MED ORDER — IBUPROFEN 800 MG PO TABS
800.0000 mg | ORAL_TABLET | Freq: Three times a day (TID) | ORAL | Status: DC | PRN
  Administered 2015-09-05: 800 mg via ORAL
  Filled 2015-09-04: qty 1

## 2015-09-04 MED ORDER — ASPIRIN 81 MG PO CHEW
81.0000 mg | CHEWABLE_TABLET | Freq: Every day | ORAL | Status: DC
  Administered 2015-09-05: 81 mg via ORAL
  Filled 2015-09-04: qty 1

## 2015-09-04 MED ORDER — GLYCOPYRROLATE 0.2 MG/ML IJ SOLN
INTRAMUSCULAR | Status: DC | PRN
  Administered 2015-09-04: .8 mg via INTRAVENOUS

## 2015-09-04 MED ORDER — NEOSTIGMINE METHYLSULFATE 10 MG/10ML IV SOLN
INTRAVENOUS | Status: AC
  Filled 2015-09-04: qty 1

## 2015-09-04 MED ORDER — ONDANSETRON HCL 4 MG/2ML IJ SOLN
INTRAMUSCULAR | Status: DC | PRN
  Administered 2015-09-04: 4 mg via INTRAVENOUS

## 2015-09-04 MED ORDER — ONDANSETRON HCL 4 MG/2ML IJ SOLN
4.0000 mg | Freq: Four times a day (QID) | INTRAMUSCULAR | Status: DC | PRN
  Administered 2015-09-04 (×2): 4 mg via INTRAVENOUS
  Filled 2015-09-04 (×2): qty 2

## 2015-09-04 MED ORDER — STERILE WATER FOR INJECTION IJ SOLN
INTRAMUSCULAR | Status: DC | PRN
  Administered 2015-09-04: 4 mL

## 2015-09-04 MED ORDER — LISINOPRIL 5 MG PO TABS
5.0000 mg | ORAL_TABLET | Freq: Every day | ORAL | Status: DC
  Administered 2015-09-05: 5 mg via ORAL
  Filled 2015-09-04: qty 1

## 2015-09-04 MED ORDER — PROPOFOL 10 MG/ML IV BOLUS
INTRAVENOUS | Status: DC | PRN
  Administered 2015-09-04: 200 mg via INTRAVENOUS

## 2015-09-04 MED ORDER — CYCLOPENTOLATE HCL 1 % OP SOLN
1.0000 [drp] | Freq: Every day | OPHTHALMIC | Status: DC
  Administered 2015-09-05: 1 [drp] via OPHTHALMIC
  Filled 2015-09-04: qty 2

## 2015-09-04 SURGICAL SUPPLY — 55 items
BAG SPEC RTRVL LRG 6X4 10 (ENDOMECHANICALS)
CABLE HIGH FREQUENCY MONO STRZ (ELECTRODE) ×1 IMPLANT
CHLORAPREP W/TINT 26ML (MISCELLANEOUS) ×3 IMPLANT
CORDS BIPOLAR (ELECTRODE) ×1 IMPLANT
COVER SURGICAL LIGHT HANDLE (MISCELLANEOUS) ×1 IMPLANT
COVER TIP SHEARS 8 DVNC (MISCELLANEOUS) ×1 IMPLANT
COVER TIP SHEARS 8MM DA VINCI (MISCELLANEOUS) ×1
DRAPE SHEET LG 3/4 BI-LAMINATE (DRAPES) ×4 IMPLANT
DRAPE SURG IRRIG POUCH 19X23 (DRAPES) ×2 IMPLANT
DRAPE TABLE BACK 44X90 PK DISP (DRAPES) ×4 IMPLANT
DRAPE WARM FLUID 44X44 (DRAPE) ×2 IMPLANT
DRSG TEGADERM 6X8 (GAUZE/BANDAGES/DRESSINGS) ×2 IMPLANT
ELECT REM PT RETURN 9FT ADLT (ELECTROSURGICAL) ×2
ELECTRODE REM PT RTRN 9FT ADLT (ELECTROSURGICAL) ×1 IMPLANT
GLOVE BIO SURGEON STRL SZ 6 (GLOVE) ×6 IMPLANT
GLOVE BIO SURGEON STRL SZ 6.5 (GLOVE) ×4 IMPLANT
GOWN STRL REUS W/ TWL LRG LVL3 (GOWN DISPOSABLE) ×3 IMPLANT
GOWN STRL REUS W/TWL LRG LVL3 (GOWN DISPOSABLE) ×6
HOLDER FOLEY CATH W/STRAP (MISCELLANEOUS) ×2 IMPLANT
KIT ACCESSORY DA VINCI DISP (KITS) ×1
KIT ACCESSORY DVNC DISP (KITS) ×1 IMPLANT
KIT BASIN OR (CUSTOM PROCEDURE TRAY) ×2 IMPLANT
KIT PROCEDURE DA VINCI SI (MISCELLANEOUS) ×1
KIT PROCEDURE DVNC SI (MISCELLANEOUS) IMPLANT
LIQUID BAND (GAUZE/BANDAGES/DRESSINGS) ×2 IMPLANT
MANIPULATOR UTERINE 4.5 ZUMI (MISCELLANEOUS) ×2 IMPLANT
NDL SAFETY ECLIPSE 18X1.5 (NEEDLE) ×1 IMPLANT
NDL SPNL 18GX3.5 QUINCKE PK (NEEDLE) IMPLANT
NEEDLE HYPO 18GX1.5 SHARP (NEEDLE) ×2
NEEDLE SPNL 18GX3.5 QUINCKE PK (NEEDLE) ×2 IMPLANT
OCCLUDER COLPOPNEUMO (BALLOONS) ×2 IMPLANT
PEN SKIN MARKING BROAD (MISCELLANEOUS) ×2 IMPLANT
PORT ACCESS TROCAR AIRSEAL 12 (TROCAR) IMPLANT
PORT ACCESS TROCAR AIRSEAL 5M (TROCAR) ×1
POUCH SPECIMEN RETRIEVAL 10MM (ENDOMECHANICALS) IMPLANT
SCISSORS LAP 5X45 EPIX DISP (ENDOMECHANICALS) ×1 IMPLANT
SET TRI-LUMEN FLTR TB AIRSEAL (TUBING) ×2 IMPLANT
SET TUBE IRRIG SUCTION NO TIP (IRRIGATION / IRRIGATOR) ×2 IMPLANT
SHEET LAVH (DRAPES) ×2 IMPLANT
SOLUTION ELECTROLUBE (MISCELLANEOUS) ×2 IMPLANT
SUT VIC AB 0 CT1 27 (SUTURE) ×2
SUT VIC AB 0 CT1 27XBRD ANTBC (SUTURE) ×1 IMPLANT
SUT VIC AB 4-0 PS2 27 (SUTURE) ×4 IMPLANT
SYR 50ML LL SCALE MARK (SYRINGE) ×2 IMPLANT
SYRINGE 10CC LL (SYRINGE) ×2 IMPLANT
TOWEL OR 17X26 10 PK STRL BLUE (TOWEL DISPOSABLE) ×4 IMPLANT
TOWEL OR NON WOVEN STRL DISP B (DISPOSABLE) ×2 IMPLANT
TRAP SPECIMEN MUCOUS 40CC (MISCELLANEOUS) ×1 IMPLANT
TRAY FOLEY W/METER SILVER 14FR (SET/KITS/TRAYS/PACK) ×2 IMPLANT
TRAY FOLEY W/METER SILVER 16FR (SET/KITS/TRAYS/PACK) ×1 IMPLANT
TRAY LAPAROSCOPIC (CUSTOM PROCEDURE TRAY) ×2 IMPLANT
TROCAR 12M 150ML BLUNT (TROCAR) IMPLANT
TROCAR BLADELESS OPT 5 100 (ENDOMECHANICALS) ×2 IMPLANT
TROCAR PORT AIRSEAL 5X120 (TROCAR) IMPLANT
WATER STERILE IRR 1500ML POUR (IV SOLUTION) ×3 IMPLANT

## 2015-09-04 NOTE — Anesthesia Procedure Notes (Addendum)
Procedure Name: Intubation Date/Time: 09/04/2015 11:36 AM Performed by: Montel Clock Pre-anesthesia Checklist: Patient identified, Emergency Drugs available, Suction available, Patient being monitored and Timeout performed Patient Re-evaluated:Patient Re-evaluated prior to inductionOxygen Delivery Method: Circle system utilized Preoxygenation: Pre-oxygenation with 100% oxygen Intubation Type: IV induction Ventilation: Mask ventilation without difficulty and Oral airway inserted - appropriate to patient size Laryngoscope Size: Mac and 3 Grade View: Grade II Tube type: Oral Tube size: 7.5 mm Number of attempts: 1 Airway Equipment and Method: Stylet Placement Confirmation: ETT inserted through vocal cords under direct vision,  positive ETCO2 and breath sounds checked- equal and bilateral Secured at: 21 cm Tube secured with: Tape Dental Injury: Teeth and Oropharynx as per pre-operative assessment

## 2015-09-04 NOTE — Anesthesia Postprocedure Evaluation (Signed)
  Anesthesia Post-op Note  Patient: Roberta [name]  Procedure(s) Performed: Procedure(s) (LRB): ROBOTIC ASSISTED HYSTERECTOMY WITH BILATERAL SALPINGO OOPHORECTOMY SENTINAL NODE MAPPING  (Bilateral)  Patient Location: PACU  Anesthesia Type: General  Level of Consciousness: awake and alert   Airway and Oxygen Therapy: Patient Spontanous Breathing  Post-op Pain: mild  Post-op Assessment: Post-op Vital signs reviewed, Patient's Cardiovascular Status Stable, Respiratory Function Stable, Patent Airway and No signs of Nausea or vomiting  Last Vitals:  Filed Vitals:   09/04/15 0826  BP: 129/75  Pulse: 69  Temp: 36.6 C  Resp: 18    Post-op Vital Signs: stable   Complications: No apparent anesthesia complications

## 2015-09-04 NOTE — H&P (View-Only) (Signed)
Consult Note: Gyn-Onc  Consult was requested by Dr. Haygood for the evaluation of Roberta [name] 58 y.o. female  CC:  Chief Complaint  Patient presents with  . endometrial cancer    Assessment/Plan:  [name]  is a 58 y.o.  year old with CAH and possible endometrial cancer (well differentiated).   A detailed discussion was held with the patient and her family with regard to to her endometrial cancer diagnosis. We discussed the standard management options for uterine cancer which includes surgery followed possibly by adjuvant therapy depending on the results of surgery. The options for surgical management include a hysterectomy and removal of the tubes and ovaries possibly with removal of pelvic and para-aortic lymph nodes. A minimally invasive approach including a robotic hysterectomy or laparoscopic hysterectomy have benefits including shorter hospital stay, recovery time and better wound healing. The alternative approach is an open hysterectomy. The patient has been counseled about these surgical options and the risks of surgery in general including infection, bleeding, damage to surrounding structures (including bowel, bladder, ureters, nerves or vessels), and the postoperative risks of PE/ DVT, and lymphedema. I extensively reviewed the additional risks of robotic hysterectomy including possible need for conversion to open laparotomy.  I discussed positioning during surgery of trendelenberg and risks of minor facial swelling and care we take in preoperative positioning.  After counseling and consideration of her options, she desires to proceed with robotic hysterectomy, BSO, sentinel lymph node biopsy.   She will be seen by anesthesia for preoperative clearance and discussion of postoperative pain management.  She was given the opportunity to ask questions, which were answered to her satisfaction, and she is agreement with the above mentioned plan of care.   HPI: [name] is a 58 year old woman who is seen in consultation at the request of Dr Haygood for a history of abnormal and post menopausal uterine bleeding. She underwent D&C sampling on 07/28/15 which showed CAH with a "suspicion" of low grade endometrial cancer.  The patient is morbidly obese and diabetic. Her past surgical history is significant for an open cholecystectomy.  Current Meds:  Outpatient Encounter Prescriptions as of 08/11/2015  Medication Sig  . albuterol (PROAIR HFA) 108 (90 BASE) MCG/ACT inhaler Inhale 2 puffs into the lungs every 4 (four) hours as needed.  . albuterol (PROVENTIL) (2.5 MG/3ML) 0.083% nebulizer solution Take 3 mLs (2.5 mg total) by nebulization every 6 (six) hours as needed for wheezing or shortness of breath.  . aspirin 81 MG tablet Take 81 mg by mouth daily.  . BESIVANCE 0.6 % SUSP   . Cholecalciferol (VITAMIN D-3) 1000 UNITS CAPS Take 3 capsules by mouth daily.  . cyclobenzaprine (FLEXERIL) 5 MG tablet Take 5 mg by mouth 3 (three) times daily as needed for muscle spasms.  . cyclopentolate (CYCLODRYL,CYCLOGYL) 1 % ophthalmic solution   . dorzolamide (TRUSOPT) 2 % ophthalmic solution   . DUREZOL 0.05 % EMUL   . ferrous sulfate 325 (65 FE) MG EC tablet Take 325 mg by mouth daily as needed.  . hydrochlorothiazide (HYDRODIURIL) 25 MG tablet TAKE 1 TABLET BY MOUTH DAILY  . lisinopril (PRINIVIL,ZESTRIL) 5 MG tablet Take 1 tablet (5 mg total) by mouth daily.  . Multiple Vitamin (MULTIVITAMIN) tablet Take 1 tablet by mouth daily.  . orlistat (ALLI) 60 MG capsule Take 60 mg by mouth 3 (three) times daily with meals.  . prednisoLONE acetate (PRED FORTE) 1 % ophthalmic suspension   . psyllium (METAMUCIL)   58.6 % powder Take 1 packet by mouth 3 (three) times daily.  . ranitidine (ZANTAC) 150 MG tablet Take 1 tablet (150 mg total) by mouth 2 (two) times daily.  . traMADol (ULTRAM) 50 MG tablet Take 50 mg by mouth every 6 (six) hours as needed.    . ibuprofen (ADVIL,MOTRIN) 600  MG tablet Ibuprofen 600 mg orally every 6 hours for 3 days and then every 6 hours as needed for pain (Patient not taking: Reported on 08/11/2015)  . meclizine (ANTIVERT) 12.5 MG tablet Take 1 tablet (12.5 mg total) by mouth 3 (three) times daily as needed for dizziness. (Patient not taking: Reported on 08/11/2015)  . NEXIUM 20 MG capsule   . ondansetron (ZOFRAN) 4 MG tablet Take 1 tablet (4 mg total) by mouth every 8 (eight) hours as needed for nausea or vomiting. (Patient not taking: Reported on 08/11/2015)  . polyethylene glycol-electrolytes (NULYTELY/GOLYTELY) 420 G solution   . predniSONE (DELTASONE) 20 MG tablet   . Travoprost, BAK Free, (TRAVATAN) 0.004 % SOLN ophthalmic solution Place 1 drop into both eyes at bedtime. (Patient not taking: Reported on 08/11/2015)   No facility-administered encounter medications on file as of 08/11/2015.    Allergy:  Allergies  Allergen Reactions  . Influenza Vaccines Anaphylaxis    Throat swelling reported after flu shot in the past (cannot verify with records but would not give)  . Budesonide Other (See Comments)    Throat infection per patient  . Prednisone Other (See Comments)    increased vaginal bleeding, able to tolerate low dose (40 mg)  . Codeine Nausea And Vomiting  . Morphine And Related Nausea And Vomiting    Social Hx:   Social History   Social History  . Marital Status: Divorced    Spouse Name: N/A  . Number of Children: 4  . Years of Education: 134   Occupational History  . DISABLED   . Previously a CNA    Social History Main Topics  . Smoking status: Never Smoker   . Smokeless tobacco: Not on file  . Alcohol Use: No  . Drug Use: No  . Sexual Activity: No   Other Topics Concern  . Not on file   Social History Narrative   Patient is a student at [employer] getting a double major (art major and online   Criminal investigation). Part time student due to learning disability. 4 kids.       On disability.                     Past Surgical Hx:  Past Surgical History  Procedure Laterality Date  . Cervical spine surgery    . Rotator cuff repair Right May 2014  . Cholecystectomy  1980  . Bunionectomy      bilateral and toe correction  . Knee arthroscopy    . Ankle surgery    . Breast cyst incision and drainage      under breast  . Dilatation & currettage/hysteroscopy with resectocope N/A 07/28/2015    Procedure: DILATATION & CURETTAGE/HYSTEROSCOPY WITH RESECTOCOPE;  Surgeon: Vanessa P Haygood, MD;  Location: [facility];  Service: Gynecology;  Laterality: N/A;    Past Medical Hx:  Past Medical History  Diagnosis Date  . Anemia   . Asthma   . Depression   . Hyperlipidemia   . Hypertension   . ALLERGIC RHINITIS 10/14/2010  . ASTHMA, INTERMITTENT 02/16/2007  . BACK PAIN, CHRONIC 03/25/2009  . CERVICAL RADICULOPATHY 10/16/2009  .   DEPRESSIVE DISORDER NOT ELSEWHERE CLASSIFIED 03/25/2009  . Gastric polyp 09/09/2011    Patient has history of a biopsy approximately 3 years ago of having a gastric polyp has endoscopy as well as colonoscopy every 5 years .   . GERD (gastroesophageal reflux disease) 09/09/2011  . Glaucoma     sees optho every 3 months, drops each night  . Postmenopausal vaginal bleeding 09/24/2014  . PONV (postoperative nausea and vomiting)   . Abnormal EKG     nonspecific ST and T-wave changes  . Family history of adverse reaction to anesthesia     "I think that my brother gets nasueous with anesthesia."     Past Gynecological History:  Uterine fibroid  No LMP recorded. Patient is postmenopausal.  Family Hx:  Family History  Problem Relation Age of Onset  . Heart disease Mother     MI in 70s  . Lymphoma Mother     related to asbestos  . Cancer Mother     lymphoma (asbestos exposure)  . Alcoholism Father   . Cirrhosis Father     due to alcohol  . Asthma Father   . Cancer Maternal Uncle     lung  . Cancer Maternal Uncle     lung    Review of  Systems:  Constitutional  Feels well,    ENT Normal appearing ears and nares bilaterally Skin/Breast  No rash, sores, jaundice, itching, dryness Cardiovascular  No chest pain, shortness of breath, or edema  Pulmonary  No cough or wheeze.  Gastro Intestinal  No nausea, vomitting, or diarrhoea. No bright red blood per rectum, no abdominal pain, change in bowel movement, or constipation.  Genito Urinary  No frequency, urgency, dysuria, see HPI Musculo Skeletal  No myalgia, arthralgia, joint swelling or pain  Neurologic  No weakness, numbness, change in gait,  Psychology  No depression, anxiety, insomnia.   Vitals:  Blood pressure 127/81, pulse 85, temperature 98.1 F (36.7 C), temperature source Oral, resp. rate 18, height 5' 6.5" (1.689 m), weight 253 lb 11.2 oz (115.078 kg), SpO2 99 %.  Physical Exam: WD in NAD Neck  Supple NROM, without any enlargements.  Lymph Node Survey No cervical supraclavicular or inguinal adenopathy Cardiovascular  Pulse normal rate, regularity and rhythm. S1 and S2 normal.  Lungs  Clear to auscultation bilateraly, without wheezes/crackles/rhonchi. Good air movement.  Skin  No rash/lesions/breakdown  Psychiatry  Alert and oriented to person, place, and time  Abdomen  Normoactive bowel sounds, abdomen soft, non-tender and obese without evidence of hernia.  Back No CVA tenderness Genito Urinary  Vulva/vagina: Normal external female genitalia.  No lesions. No discharge or bleeding.  Bladder/urethra:  No lesions or masses, well supported bladder  Vagina: normal  Cervix: Normal appearing, no lesions.  Uterus: Small, mobile, no parametrial involvement or nodularity.  Adnexa: no palpable masses. Rectal  Good tone, no masses no cul de sac nodularity.  Extremities  No bilateral cyanosis, clubbing or edema.   Gracyn Allor Caroline, MD  08/11/2015, 2:28 PM      

## 2015-09-04 NOTE — Interval H&P Note (Signed)
History and Physical Interval Note:  09/04/2015 10:52 AM  Roberta [name]  has presented today for surgery, with the diagnosis of ENDOMETRIAL CANCER  The various methods of treatment have been discussed with the patient and family. After consideration of risks, benefits and other options for treatment, the patient has consented to  Procedure(s): ROBOTIC ASSISTED HYSTERECTOMY WITH BILATERAL SALPINGO OOPHORECTOMY [place]  (Bilateral) as a surgical intervention .  The patient's history has been reviewed, patient examined, no change in status, stable for surgery.  I have reviewed the patient's chart and labs.  Questions were answered to the patient's satisfaction.     [name]

## 2015-09-04 NOTE — Anesthesia Preprocedure Evaluation (Addendum)
Anesthesia Evaluation  Patient identified by MRN, date of birth, ID band Patient awake    Reviewed: Allergy & Precautions, NPO status , Patient's Chart, lab work & pertinent test results  History of Anesthesia Complications (+) PONV and history of anesthetic complications  Airway Mallampati: III  TM Distance: >3 FB Neck ROM: Full    Dental no notable dental hx. (+) Dental Advisory Given   Pulmonary asthma ,    Pulmonary exam normal breath sounds clear to auscultation       Cardiovascular hypertension, Pt. on medications Normal cardiovascular exam Rhythm:Regular Rate:Normal     Neuro/Psych PSYCHIATRIC DISORDERS Anxiety Depression negative neurological ROS     GI/Hepatic Neg liver ROS, GERD  Medicated and Controlled,  Endo/Other  Morbid obesity  Renal/GU negative Renal ROS  negative genitourinary   Musculoskeletal  (+) Arthritis ,   Abdominal (+) + obese,   Peds negative pediatric ROS (+)  Hematology negative hematology ROS (+)   Anesthesia Other Findings   Reproductive/Obstetrics negative OB ROS                           Anesthesia Physical Anesthesia Plan  ASA: III  Anesthesia Plan: General   Post-op Pain Management:    Induction: Intravenous  Airway Management Planned: Oral ETT  Additional Equipment:   Intra-op Plan:   Post-operative Plan: Extubation in OR  Informed Consent: I have reviewed the patients History and Physical, chart, labs and discussed the procedure including the risks, benefits and alternatives for the proposed anesthesia with the patient or authorized representative who has indicated his/her understanding and acceptance.   Dental advisory given  Plan Discussed with:   Anesthesia Plan Comments:        Anesthesia Quick Evaluation

## 2015-09-04 NOTE — Op Note (Signed)
OPERATIVE NOTE 09/04/15  Surgeon: [name]   Assistants: Dr Lahoma Crocker (an MD assistant was necessary for tissue manipulation, management of robotic instrumentation, retraction and positioning due to the complexity of the case and hospital policies).   Anesthesia: General endotracheal anesthesia  ASA Class: 3   Pre-operative Diagnosis: grade 1 endometrial cancer  Post-operative Diagnosis: same  Operation: Robotic-assisted laparoscopic hysterectomy with bilateral salpingoophorectomy   Surgeon: [name]  Assistant Surgeon: Lahoma Crocker MD  Anesthesia: GET  Urine Output: 100  Operative Findings:  : 8 week size uterus, normal tubes and ovaries, no evidence for gross extrauterine disease. No suspcious nodes. Frozen revealed a 2cm low grade tumor that was minimally invasive.  Estimated Blood Loss:  20cc      Total IV Fluids: 1,000 ml         Specimens: uterus, cervix, bilateral tubes and ovaries, right external iliac SLN, right obturator SLN, left common iliacSLN         Complications:  None; patient tolerated the procedure well.         Disposition: PACU - hemodynamically stable.  Procedure Details  The patient was seen in the Holding Room. The risks, benefits, complications, treatment options, and expected outcomes were discussed with the patient.  The patient concurred with the proposed plan, giving informed consent.  The site of surgery properly noted/marked. The patient was identified as Roberta [name] and the procedure verified as a Robotic-assisted hysterectomy with bilateral salpingo oophorectomy. A Time Out was held and the above information confirmed.  After induction of anesthesia, the patient was draped and prepped in the usual sterile manner. Pt was placed in supine position after anesthesia and draped and prepped in the usual sterile manner. The abdominal drape was placed after the CholoraPrep had been allowed to dry for 3 minutes.   Her arms were tucked to her side with all appropriate precautions.  The shoulders were stabilized with padded shoulder blocks applied to the acromium processes.  The patient was placed in the semi-lithotomy position in [place].  The perineum was prepped with Betadine.  Foley catheter was placed.  A sterile speculum was placed in the vagina.  The cervix was grasped with a single-tooth tenaculum. 1mg  total of ICG was injected into the cervical stroma at 2 and 9 o'clock at a 20mm depth (concentration 0..5mg /ml). The cervix was dilated with Kennon Rounds dilators.  The ZUMI uterine manipulator with a medium colpotomizer ring was placed without difficulty.  A pneum occluder balloon was placed over the manipulator.  A second time-out was performed.  OG tube placement was confirmed and to suction.    The patient was then draped in the normal manner.  Next, a 5 mm skin incision was made 1 cm below the subcostal margin in the midclavicular line.  The 5 mm Optiview port and scope was used for direct entry.  Opening pressure was under 10 mm CO2.  The abdomen was insufflated and the findings were noted as above.   At this point and all points during the procedure, the patient's intra-abdominal pressure did not exceed 15 mmHg. Next, a 10 mm skin incision was made in the umbilicus and a right and left port was placed about 10 cm lateral to the robot port on the right and left side.  A fourth arm was placed in the left lower quadrant 2 cm above and superior and medial to the anterior superior iliac spine.  All ports were placed under direct visualization.  The patient was placed in steep Trendelenburg.  Bowel was away into the upper abdomen.  The robot was docked in the normal manner.  The right and left peritoneum were opened parallel to the IP ligament to open the retroperitoneal spaces bilaterally. The SLN mapping was performed in bilateral pelvic basins. The para rectal and paravesical spaces were opened up. Lymphatic  channels were identified travelling to the following visualized sentinel lymph node's: right obturator SLN and right external iliac SLN, left common SLN. These SLN's were separated from their surrounding lymphatic tissue, removed and sent for permanent pathology.   The hysterectomy was started after the round ligament on the right side was incised and the retroperitoneum was entered and the pararectal space was developed.  The ureter was noted to be on the medial leaf of the broad ligament.  The peritoneum above the ureter was incised and stretched and the infundibulopelvic ligament was skeletonized, cauterized and cut.  The posterior peritoneum was taken down to the level of the KOH ring.  The anterior peritoneum was also taken down.  The bladder flap was created to the level of the KOH ring.  The uterine artery on the right side was skeletonized, cauterized and cut in the normal manner.  A similar procedure was performed on the left.  The colpotomy was made and the uterus, cervix, bilateral ovaries and tubes were amputated and delivered through the vagina.  Pedicles were inspected and excellent hemostasis was achieved.    The colpotomy at the vaginal cuff was closed with Vicryl on a CT1 needle in a running manner.  Irrigation was used and excellent hemostasis was achieved.  At this point in the procedure was completed.  Robotic instruments were removed under direct visulaization.  The robot was undocked. The 10 mm ports were closed with Vicryl on a UR-5 needle and the fascia was closed with 0 Vicryl on a UR-5 needle.  The skin was closed with 4-0 Vicryl in a subcuticular manner.  Dermabond was applied.  Sponge, lap and needle counts correct x 2.  The patient was taken to the recovery room in stable condition.  The vagina was swabbed with  minimal bleeding noted.   All instrument and needle counts were correct x  3.   The patient was transferred to the recovery room in a stable condition.  [name], MD

## 2015-09-04 NOTE — Transfer of Care (Signed)
Immediate Anesthesia Transfer of Care Note  Patient: Roberta [name]  Procedure(s) Performed: Procedure(s): ROBOTIC ASSISTED HYSTERECTOMY WITH BILATERAL SALPINGO OOPHORECTOMY SENTINAL NODE MAPPING  (Bilateral)  Patient Location: PACU  Anesthesia Type:General  Level of Consciousness:  sedated, patient cooperative and responds to stimulation  Airway & Oxygen Therapy:Patient Spontanous Breathing and Patient connected to face mask oxgen  Post-op Assessment:  Report given to PACU RN and Post -op Vital signs reviewed and stable  Post vital signs:  Reviewed and stable  Last Vitals:  Filed Vitals:   09/04/15 0826  BP: 129/75  Pulse: 69  Temp: 36.6 C  Resp: 18    Complications: No apparent anesthesia complications

## 2015-09-05 DIAGNOSIS — H409 Unspecified glaucoma: Secondary | ICD-10-CM | POA: Diagnosis not present

## 2015-09-05 DIAGNOSIS — J45909 Unspecified asthma, uncomplicated: Secondary | ICD-10-CM | POA: Diagnosis not present

## 2015-09-05 DIAGNOSIS — Z79899 Other long term (current) drug therapy: Secondary | ICD-10-CM | POA: Diagnosis not present

## 2015-09-05 DIAGNOSIS — C541 Malignant neoplasm of endometrium: Secondary | ICD-10-CM | POA: Diagnosis not present

## 2015-09-05 DIAGNOSIS — Z7982 Long term (current) use of aspirin: Secondary | ICD-10-CM | POA: Diagnosis not present

## 2015-09-05 DIAGNOSIS — K219 Gastro-esophageal reflux disease without esophagitis: Secondary | ICD-10-CM | POA: Diagnosis not present

## 2015-09-05 DIAGNOSIS — E119 Type 2 diabetes mellitus without complications: Secondary | ICD-10-CM | POA: Diagnosis not present

## 2015-09-05 DIAGNOSIS — Z6841 Body Mass Index (BMI) 40.0 and over, adult: Secondary | ICD-10-CM | POA: Diagnosis not present

## 2015-09-05 DIAGNOSIS — M1990 Unspecified osteoarthritis, unspecified site: Secondary | ICD-10-CM | POA: Diagnosis not present

## 2015-09-05 DIAGNOSIS — G8929 Other chronic pain: Secondary | ICD-10-CM | POA: Diagnosis not present

## 2015-09-05 DIAGNOSIS — M549 Dorsalgia, unspecified: Secondary | ICD-10-CM | POA: Diagnosis not present

## 2015-09-05 DIAGNOSIS — Z7952 Long term (current) use of systemic steroids: Secondary | ICD-10-CM | POA: Diagnosis not present

## 2015-09-05 DIAGNOSIS — D259 Leiomyoma of uterus, unspecified: Secondary | ICD-10-CM | POA: Diagnosis not present

## 2015-09-05 DIAGNOSIS — I10 Essential (primary) hypertension: Secondary | ICD-10-CM | POA: Diagnosis not present

## 2015-09-05 DIAGNOSIS — N72 Inflammatory disease of cervix uteri: Secondary | ICD-10-CM | POA: Diagnosis not present

## 2015-09-05 DIAGNOSIS — E785 Hyperlipidemia, unspecified: Secondary | ICD-10-CM | POA: Diagnosis not present

## 2015-09-05 LAB — BASIC METABOLIC PANEL
ANION GAP: 7 (ref 5–15)
BUN: 13 mg/dL (ref 6–20)
CALCIUM: 8.8 mg/dL — AB (ref 8.9–10.3)
CO2: 28 mmol/L (ref 22–32)
Chloride: 107 mmol/L (ref 101–111)
Creatinine, Ser: 1.02 mg/dL — ABNORMAL HIGH (ref 0.44–1.00)
GFR, EST NON AFRICAN AMERICAN: 59 mL/min — AB (ref >60)
Glucose, Bld: 124 mg/dL — ABNORMAL HIGH (ref 65–99)
POTASSIUM: 3.9 mmol/L (ref 3.5–5.1)
Sodium: 142 mmol/L (ref 135–145)

## 2015-09-05 LAB — CBC
HCT: 37.1 % (ref 36.0–46.0)
Hemoglobin: 11.3 g/dL — ABNORMAL LOW (ref 12.0–15.0)
MCH: 25.6 pg — ABNORMAL LOW (ref 26.0–34.0)
MCHC: 30.5 g/dL (ref 30.0–36.0)
MCV: 84.1 fL (ref 78.0–100.0)
PLATELETS: 304 10*3/uL (ref 150–400)
RBC: 4.41 MIL/uL (ref 3.87–5.11)
RDW: 14.3 % (ref 11.5–15.5)
WBC: 9.1 10*3/uL (ref 4.0–10.5)

## 2015-09-05 MED ORDER — FAMOTIDINE 20 MG PO TABS
20.0000 mg | ORAL_TABLET | Freq: Two times a day (BID) | ORAL | Status: DC | PRN
  Administered 2015-09-05 (×2): 20 mg via ORAL
  Filled 2015-09-05 (×4): qty 1

## 2015-09-05 NOTE — Discharge Instructions (Signed)
09/05/2015  Return to work: 4-6 weeks if applicable  Activity: 1. Be up and out of the bed during the day.  Take a nap if needed.  You may walk up steps but be careful and use the hand rail.  Stair climbing will tire you more than you think, you may need to stop part way and rest.   2. No lifting or straining for 6 weeks.  3. No driving for 1 week(s) if applicable.  Do not drive if you are taking narcotic pain medicine.  4. Shower daily.  Use soap and water on your incision and pat dry; don't rub.  No tub baths until cleared by your surgeon.   5. No sexual activity and nothing in the vagina for 8 weeks.  6. You may experience a small amount of clear drainage from your incisions, which is normal.  If the drainage persists or increases, please call the office.  Diet: 1. Low sodium Heart Healthy Diet is recommended.  2. It is safe to use a laxative, such as Miralax or Colace, if you have difficulty moving your bowels.   Wound Care: 1. Keep clean and dry.  Shower daily.  Reasons to call the Doctor:  Fever - Oral temperature greater than 100.4 degrees Fahrenheit  Foul-smelling vaginal discharge  Difficulty urinating  Nausea and vomiting  Increased pain at the site of the incision that is unrelieved with pain medicine.  Difficulty breathing with or without chest pain  New calf pain especially if only on one side  Sudden, continuing increased vaginal bleeding with or without clots.   Contacts: For questions or concerns you should contact:  Dr. Everitt Amber at [phone]  Joylene John, NP at [phone]  After Hours: call [phone] and ask for the GYN Oncologist on call  Abdominal Hysterectomy, Care After These instructions give you information on caring for yourself after your procedure. Your doctor may also give you more specific instructions. Call your doctor if you have any problems or questions after your procedure.  HOME CARE It takes 4-6 weeks to recover  from this surgery. Follow all of your doctor's instructions.   Only take medicines as told by your doctor.  Change your bandage as told by your doctor.  Return to your doctor to have your stitches taken out.  Take showers for 2-3 weeks. Ask your doctor when it is okay to shower.  Do not douche, use tampons, or have sex (intercourse) for at least 6 weeks or as told.  Follow your doctor's advice about exercise, lifting objects, driving, and general activities.  Get plenty of rest and sleep.  Do not lift anything heavier than a gallon of milk (about 10 pounds [4.5 kilograms]) for the first month after surgery.  Get back to your normal diet as told by your doctor.  Do not drink alcohol until your doctor says it is okay.  Take a medicine to help you poop (laxative) as told by your doctor.  Eating foods high in fiber may help you poop. Eat a lot of raw fruits and vegetables, whole grains, and beans.  Drink enough fluids to keep your pee (urine) clear or pale yellow.  Have someone help you at home for 1-2 weeks after your surgery.  Keep follow-up doctor visits as told. GET HELP IF:  You have chills or fever.  You have puffiness, redness, or pain in area of the cut (incision).  You have yellowish-white fluid (pus) coming from the cut.  You have a bad  smell coming from the cut or bandage.  Your cut pulls apart.  You feel dizzy or light-headed.  You have pain or bleeding when you pee.  You keep having watery poop (diarrhea).  You keep feeling sick to your stomach (nauseous) or keep throwing up (vomiting).  You have fluid (discharge) coming from your vagina.  You have a rash.  You have a reaction to your medicine.  You need stronger pain medicine. GET HELP RIGHT AWAY IF:   You have a fever and your symptoms suddenly get worse.  You have bad belly (abdominal) pain.  You have chest pain.  You are short of breath.  You pass out (faint).  You have pain,  puffiness, or redness of your leg.  You bleed a lot from your vagina and notice clumps of tissue (clots). MAKE SURE YOU:   Understand these instructions.  Will watch your condition.  Will get help right away if you are not doing well or get worse. Document Released: 09/14/2008 Document Revised: 12/11/2013 Document Reviewed: 09/28/2013 [facility] Patient Information 2015 [place], [place]. This information is not intended to replace advice given to you by your health care provider. Make sure you discuss any questions you have with your health care provider.

## 2015-09-05 NOTE — Discharge Summary (Signed)
Physician Discharge Summary  Patient ID: Roberta [name] MRN: [id number] DOB/AGE: 61-Nov-1957 61 y.o.  Admit date: 09/04/2015 Discharge date: 09/05/2015  Admission Diagnoses: Endometrial cancer  Discharge Diagnoses:  Principal Problem:   Endometrial cancer   Discharged Condition:  The patient is in good condition and stable for discharge.    Hospital Course: On 09/04/2015, the patient underwent the following: Procedure(s): ROBOTIC ASSISTED HYSTERECTOMY WITH BILATERAL SALPINGO OOPHORECTOMY [place].  The postoperative course was uneventful.  She was discharged to home on postoperative day 1 tolerating a regular diet, minimal pain, voiding without difficulty, passing flatus.  Consults: None  Significant Diagnostic Studies: None  Treatments: surgery: see above  Discharge Exam: Blood pressure 117/73, pulse 62, temperature 98.5 F (36.9 C), temperature source Oral, resp. rate 18, height 5\' 6"  (1.676 m), weight 252 lb (114.306 kg), SpO2 100 %. General appearance: alert, cooperative and no distress Resp: clear to auscultation bilaterally Cardio: regular rate and rhythm, S1, S2 normal, no murmur, click, rub or gallop GI: soft, non-tender; bowel sounds normal; no masses,  no organomegaly Extremities: extremities normal, atraumatic, no cyanosis or edema Incision/Wound: Lap sites with dermabond without erythema or drainage  Disposition: 01-Home or Self Care  Discharge Instructions    Call MD for:  difficulty breathing, headache or visual disturbances    Complete by:  As directed      Call MD for:  extreme fatigue    Complete by:  As directed      Call MD for:  hives    Complete by:  As directed      Call MD for:  persistant dizziness or light-headedness    Complete by:  As directed      Call MD for:  persistant nausea and vomiting    Complete by:  As directed      Call MD for:  redness, tenderness, or signs of infection (pain, swelling, redness, odor or green/yellow  discharge around incision site)    Complete by:  As directed      Call MD for:  severe uncontrolled pain    Complete by:  As directed      Call MD for:  temperature >100.4    Complete by:  As directed      Diet - low sodium heart healthy    Complete by:  As directed      Driving Restrictions    Complete by:  As directed   No driving for 1 week if applicable.  Do not take narcotics and drive.     Increase activity slowly    Complete by:  As directed      Lifting restrictions    Complete by:  As directed   No lifting greater than 10 lbs.     Sexual Activity Restrictions    Complete by:  As directed   No sexual activity, nothing in the vagina, for 8 weeks.            Medication List    TAKE these medications        albuterol 108 (90 BASE) MCG/ACT inhaler  Commonly known as:  PROAIR HFA  Inhale 2 puffs into the lungs every 4 (four) hours as needed.     albuterol (2.5 MG/3ML) 0.083% nebulizer solution  Commonly known as:  PROVENTIL  Take 3 mLs (2.5 mg total) by nebulization every 6 (six) hours as needed for wheezing or shortness of breath.     aspirin 81 MG tablet  Take 81 mg by  mouth daily.     calcium carbonate 600 MG Tabs tablet  Commonly known as:  OS-CAL  Take 600 mg by mouth daily with breakfast.     cyclobenzaprine 5 MG tablet  Commonly known as:  FLEXERIL  Take 5 mg by mouth 3 (three) times daily as needed for muscle spasms.     cyclopentolate 1 % ophthalmic solution  Commonly known as:  CYCLODRYL,CYCLOGYL  Place 1 drop into both eyes daily.     dorzolamide 2 % ophthalmic solution  Commonly known as:  TRUSOPT  Place 1 drop into both eyes 2 (two) times daily.     ferrous sulfate 325 (65 FE) MG EC tablet  Take 325 mg by mouth daily with breakfast.     HAIR SKIN AND NAILS FORMULA PO  Take 1 tablet by mouth every morning.     hydrochlorothiazide 25 MG tablet  Commonly known as:  HYDRODIURIL  TAKE 1 TABLET BY MOUTH DAILY     ibuprofen 600 MG tablet   Commonly known as:  ADVIL,MOTRIN  Ibuprofen 600 mg orally every 6 hours for 3 days and then every 6 hours as needed for pain     ketorolac 0.5 % ophthalmic solution  Commonly known as:  ACULAR  Place 1 drop into both eyes 3 (three) times daily.     lisinopril 5 MG tablet  Commonly known as:  PRINIVIL,ZESTRIL  Take 1 tablet (5 mg total) by mouth daily.     Magnesium 250 MG Tabs  Take 250 mg by mouth every morning.     meclizine 12.5 MG tablet  Commonly known as:  ANTIVERT  Take 1 tablet (12.5 mg total) by mouth 3 (three) times daily as needed for dizziness.     multivitamin tablet  Take 1 tablet by mouth every morning.     ondansetron 4 MG tablet  Commonly known as:  ZOFRAN  Take 1 tablet (4 mg total) by mouth every 8 (eight) hours as needed for nausea or vomiting.     orlistat 60 MG capsule  Commonly known as:  ALLI  Take 60 mg by mouth 3 (three) times daily with meals.     prednisoLONE acetate 1 % ophthalmic suspension  Commonly known as:  PRED FORTE  Place 1 drop into both eyes 3 (three) times daily.     psyllium 58.6 % powder  Commonly known as:  METAMUCIL  Take 1 packet by mouth 3 (three) times daily.     ranitidine 150 MG tablet  Commonly known as:  ZANTAC  Take 1 tablet (150 mg total) by mouth 2 (two) times daily.     traMADol 50 MG tablet  Commonly known as:  ULTRAM  Take 50 mg by mouth every 6 (six) hours as needed for moderate pain.     Travoprost (BAK Free) 0.004 % Soln ophthalmic solution  Commonly known as:  TRAVATAN  Place 1 drop into both eyes at bedtime.     Vitamin D-3 1000 UNITS Caps  Take 3,000 Units by mouth every morning.           Follow-up Information    Follow up with [name], MD On 10/06/2015.   Specialty:  Obstetrics and Gynecology   Why:  at 2:30pm at the [employer] information:   [place] [address] [phone]       Greater than thirty minutes were spend for face to face  discharge instructions and discharge orders/summary in  EPIC.   Signed: Virgilia Quigg DEAL 09/05/2015, 12:42 PM

## 2015-09-05 NOTE — Progress Notes (Signed)
Patient is alert and oriented, vital signs are stable, incisions are within normal limits, patient is tolerating her diet without complaints of nausea or vomiting, discharge instructions reviewed, patient voiding adequately, questions and concerns answered Neta Mends RN 1:02 PM 09-05-2015

## 2015-09-06 ENCOUNTER — Emergency Department (HOSPITAL_COMMUNITY)
Admission: EM | Admit: 2015-09-06 | Discharge: 2015-09-06 | Disposition: A | Discharge status: Home / Self Care | Payer: Medicare Other | Attending: Emergency Medicine | Admitting: Emergency Medicine

## 2015-09-06 ENCOUNTER — Encounter (HOSPITAL_COMMUNITY): Payer: Self-pay | Admitting: Emergency Medicine

## 2015-09-06 DIAGNOSIS — D649 Anemia, unspecified: Secondary | ICD-10-CM | POA: Diagnosis not present

## 2015-09-06 DIAGNOSIS — F329 Major depressive disorder, single episode, unspecified: Secondary | ICD-10-CM | POA: Diagnosis not present

## 2015-09-06 DIAGNOSIS — G8929 Other chronic pain: Secondary | ICD-10-CM | POA: Insufficient documentation

## 2015-09-06 DIAGNOSIS — Z7982 Long term (current) use of aspirin: Secondary | ICD-10-CM | POA: Insufficient documentation

## 2015-09-06 DIAGNOSIS — J45909 Unspecified asthma, uncomplicated: Secondary | ICD-10-CM | POA: Insufficient documentation

## 2015-09-06 DIAGNOSIS — Y838 Other surgical procedures as the cause of abnormal reaction of the patient, or of later complication, without mention of misadventure at the time of the procedure: Secondary | ICD-10-CM | POA: Diagnosis not present

## 2015-09-06 DIAGNOSIS — K91840 Postprocedural hemorrhage and hematoma of a digestive system organ or structure following a digestive system procedure: Secondary | ICD-10-CM | POA: Diagnosis not present

## 2015-09-06 DIAGNOSIS — K219 Gastro-esophageal reflux disease without esophagitis: Secondary | ICD-10-CM | POA: Insufficient documentation

## 2015-09-06 DIAGNOSIS — H409 Unspecified glaucoma: Secondary | ICD-10-CM | POA: Diagnosis not present

## 2015-09-06 DIAGNOSIS — L7622 Postprocedural hemorrhage and hematoma of skin and subcutaneous tissue following other procedure: Secondary | ICD-10-CM | POA: Diagnosis not present

## 2015-09-06 DIAGNOSIS — Z79899 Other long term (current) drug therapy: Secondary | ICD-10-CM | POA: Diagnosis not present

## 2015-09-06 DIAGNOSIS — T888XXA Other specified complications of surgical and medical care, not elsewhere classified, initial encounter: Secondary | ICD-10-CM

## 2015-09-06 DIAGNOSIS — Z8542 Personal history of malignant neoplasm of other parts of uterus: Secondary | ICD-10-CM | POA: Insufficient documentation

## 2015-09-06 DIAGNOSIS — Z8739 Personal history of other diseases of the musculoskeletal system and connective tissue: Secondary | ICD-10-CM | POA: Insufficient documentation

## 2015-09-06 DIAGNOSIS — T792XXA Traumatic secondary and recurrent hemorrhage and seroma, initial encounter: Secondary | ICD-10-CM

## 2015-09-06 NOTE — ED Provider Notes (Signed)
CSN: [id number]     Arrival date & time 09/06/15  2134 History   First MD Initiated Contact with Patient 09/06/15 2214     Chief Complaint  Patient presents with  . Post-op Problem     (Consider location/radiation/quality/duration/timing/severity/associated sxs/prior Treatment) HPI Comments: Patient presents to the ER for evaluation of problems that her hysterectomy site. Patient underwent hysterectomy 2 days ago. She went home from the hospital yesterday. Today she noticed pink and bloody drainage from the operative site just above her umbilicus in the area has opened up. There has not been any fever. There is no purulent drainage. No surrounding erythema or rash.   Past Medical History  Diagnosis Date  . Anemia   . Asthma   . Depression   . Hyperlipidemia   . Hypertension   . ALLERGIC RHINITIS 10/14/2010  . ASTHMA, INTERMITTENT 02/16/2007  . BACK PAIN, CHRONIC 03/25/2009  . CERVICAL RADICULOPATHY 10/16/2009  . DEPRESSIVE DISORDER NOT ELSEWHERE CLASSIFIED 03/25/2009  . GERD (gastroesophageal reflux disease) 09/09/2011  . Glaucoma     sees optho every 3 months, drops each night  . Postmenopausal vaginal bleeding 09/24/2014  . PONV (postoperative nausea and vomiting)   . Abnormal EKG     nonspecific ST and T-wave changes - Followed by Dr.Berry  . DDD (degenerative disc disease), lumbosacral   . Endometrial cancer    Past Surgical History  Procedure Laterality Date  . Cervical spine surgery    . Rotator cuff repair Right May 2014  . Cholecystectomy  1980  . Bunionectomy      bilateral and toe correction  . Knee arthroscopy    . Ankle surgery    . Breast cyst incision and drainage      under breast  . Dilatation & currettage/hysteroscopy with resectocope N/A 07/28/2015    Procedure: [place];  Surgeon: Eldred Manges, MD;  Location: [facility];  Service: Gynecology;  Laterality: N/A;  . Biopsy breast      LEFT  . Cataract surgery    .  Carpal tunnel release Bilateral 2009  . Robotic assisted total hysterectomy with bilateral salpingo oopherectomy Bilateral 09/04/2015    Procedure: ROBOTIC ASSISTED HYSTERECTOMY WITH BILATERAL SALPINGO OOPHORECTOMY SENTINAL NODE MAPPING ;  Surgeon: Everitt Amber, MD;  Location: [facility];  Service: Gynecology;  Laterality: Bilateral;   Family History  Problem Relation Age of Onset  . Heart disease Mother     MI in 60s  . Lymphoma Mother     related to asbestos  . Cancer Mother     lymphoma (asbestos exposure)  . Alcoholism Father   . Cirrhosis Father     due to alcohol  . Asthma Father   . Cancer Maternal Uncle     lung  . Cancer Maternal Uncle     lung   Social History  Substance Use Topics  . Smoking status: Never Smoker   . Smokeless tobacco: None  . Alcohol Use: No   OB History    Gravida Para Term Preterm AB TAB SAB Ectopic Multiple Living   4 3 3       0 4     Review of Systems  Skin: Positive for wound.  All other systems reviewed and are negative.     Allergies  Influenza vaccines; Budesonide; Prednisone; Flonase; Codeine; Morphine and related; and Nickel  Home Medications   Prior to Admission medications   Medication Sig Start Date End Date Taking? Authorizing Provider  aspirin 81  MG tablet Take 81 mg by mouth daily.   Yes Historical Provider, MD  calcium carbonate (OS-CAL) 600 MG TABS tablet Take 600 mg by mouth daily with breakfast.   Yes Historical Provider, MD  Cholecalciferol (VITAMIN D-3) 1000 UNITS CAPS Take 3,000 Units by mouth every morning.    Yes Historical Provider, MD  cyclobenzaprine (FLEXERIL) 5 MG tablet Take 5 mg by mouth 3 (three) times daily as needed for muscle spasms.   Yes Historical Provider, MD  dorzolamide (TRUSOPT) 2 % ophthalmic solution Place 1 drop into both eyes 2 (two) times daily.  07/11/15  Yes Historical Provider, MD  ferrous sulfate 325 (65 FE) MG EC tablet Take 325 mg by mouth daily with breakfast.    Yes Historical Provider, MD   hydrochlorothiazide (HYDRODIURIL) 25 MG tablet TAKE 1 TABLET BY MOUTH DAILY Patient taking differently: TAKE 1 TABLET BY MOUTH DAILY IN THE MORNING 07/15/15  Yes Mariel Aloe, MD  ketorolac (ACULAR) 0.5 % ophthalmic solution Place 1 drop into both eyes 3 (three) times daily.   Yes Historical Provider, MD  lisinopril (PRINIVIL,ZESTRIL) 5 MG tablet Take 1 tablet (5 mg total) by mouth daily. 04/21/15  Yes Mariel Aloe, MD  Magnesium 250 MG TABS Take 250 mg by mouth every morning.   Yes Historical Provider, MD  Multiple Vitamin (MULTIVITAMIN) tablet Take 1 tablet by mouth every morning.    Yes Historical Provider, MD  Multiple Vitamins-Minerals (HAIR SKIN AND NAILS FORMULA PO) Take 1 tablet by mouth every morning.   Yes Historical Provider, MD  orlistat (ALLI) 60 MG capsule Take 60 mg by mouth 3 (three) times daily with meals.   Yes Historical Provider, MD  prednisoLONE acetate (PRED FORTE) 1 % ophthalmic suspension Place 1 drop into both eyes 3 (three) times daily.  07/29/15  Yes Historical Provider, MD  psyllium (METAMUCIL) 58.6 % powder Take 1 packet by mouth 3 (three) times daily.   Yes Historical Provider, MD  ranitidine (ZANTAC) 150 MG tablet Take 1 tablet (150 mg total) by mouth 2 (two) times daily. 06/25/15  Yes Mariel Aloe, MD  traMADol (ULTRAM) 50 MG tablet Take 50 mg by mouth every 6 (six) hours as needed for moderate pain.    Yes Historical Provider, MD  albuterol (PROAIR HFA) 108 (90 BASE) MCG/ACT inhaler Inhale 2 puffs into the lungs every 4 (four) hours as needed. Patient taking differently: Inhale 2 puffs into the lungs every 4 (four) hours as needed for wheezing or shortness of breath.  11/14/12   Cletus Gash, MD  albuterol (PROVENTIL) (2.5 MG/3ML) 0.083% nebulizer solution Take 3 mLs (2.5 mg total) by nebulization every 6 (six) hours as needed for wheezing or shortness of breath. 05/10/15   Nehemiah Settle, NP  cyclopentolate (CYCLODRYL,CYCLOGYL) 1 % ophthalmic solution Place 1  drop into both eyes daily.  07/11/15   Historical Provider, MD  ibuprofen (ADVIL,MOTRIN) 600 MG tablet Ibuprofen 600 mg orally every 6 hours for 3 days and then every 6 hours as needed for pain Patient not taking: Reported on 08/11/2015 07/28/15   Eldred Manges, MD  meclizine (ANTIVERT) 12.5 MG tablet Take 1 tablet (12.5 mg total) by mouth 3 (three) times daily as needed for dizziness. 09/24/14   Bernadene Bell, MD  ondansetron (ZOFRAN) 4 MG tablet Take 1 tablet (4 mg total) by mouth every 8 (eight) hours as needed for nausea or vomiting. Patient taking differently: Take 4 mg by mouth every 8 (eight) hours as needed  for nausea or vomiting. For vertigo 09/24/14   Bernadene Bell, MD  Travoprost, BAK Free, (TRAVATAN) 0.004 % SOLN ophthalmic solution Place 1 drop into both eyes at bedtime. Patient not taking: Reported on 08/19/2015 10/23/14   Bernadene Bell, MD   BP 130/75 mmHg  Pulse 64  Temp(Src) 98.2 F (36.8 C) (Oral)  Resp 16  SpO2 100% Physical Exam  Constitutional: She is oriented to person, place, and time. She appears well-developed and well-nourished. No distress.  HENT:  Head: Normocephalic and atraumatic.  Right Ear: Hearing normal.  Left Ear: Hearing normal.  Nose: Nose normal.  Mouth/Throat: Oropharynx is clear and moist and mucous membranes are normal.  Eyes: Conjunctivae and EOM are normal. Pupils are equal, round, and reactive to light.  Neck: Normal range of motion. Neck supple.  Cardiovascular: Regular rhythm, S1 normal and S2 normal.  Exam reveals no gallop and no friction rub.   No murmur heard. Pulmonary/Chest: Effort normal and breath sounds normal. No respiratory distress. She exhibits no tenderness.  Abdominal: Soft. Normal appearance and bowel sounds are normal. There is no hepatosplenomegaly. There is no tenderness. There is no rebound, no guarding, no tenderness at McBurney's point and negative Murphy's sign. No hernia.  Musculoskeletal: Normal range of motion.   Neurological: She is alert and oriented to person, place, and time. She has normal strength. No cranial nerve deficit or sensory deficit. Coordination normal. GCS eye subscore is 4. GCS verbal subscore is 5. GCS motor subscore is 6.  Skin: Skin is warm, dry and intact. No rash noted. No cyanosis.  1 cm dehiscence of skin at operative site superior to umbilicus. No erythema or induration. No drift. Drainage. Bandage in place has evidence of small amount of bloody and serous fluid drainage from the region.  Psychiatric: She has a normal mood and affect. Her speech is normal and behavior is normal. Thought content normal.  Nursing note and vitals reviewed.   ED Course  Procedures (including critical care time) Labs Review Labs Reviewed - No data to display  Imaging Review No results found. I have personally reviewed and evaluated these images and lab results as part of my medical decision-making.   EKG Interpretation None      MDM   Final diagnoses:  None   postoperative seroma  Patient presents to the ER for evaluation of problems with her surgical site. She has a 1 cm area of skin dehiscence. This area was probed with a sterile swab. It is approximately 0.75 centimeters deep. I can see the base of it, this is not a complete dehiscence. There is no sign of infection. Examination is consistent with small postoperative seroma which has spontaneously opened. Patient reassured, given instructions on local care. Return if there is any fever, redness or purulent drainage.    Orpah Greek, MD 09/06/15 [phone]

## 2015-09-06 NOTE — ED Notes (Addendum)
Pt from home c/o her incision site from hysterectomy opening up. Small amount of bloody drainage noted. No increased swelling, redness, or fever. Hysterectomy was on thursday

## 2015-09-06 NOTE — Discharge Instructions (Signed)
Keep the area covered with clean gauze. Keep the area clean and dry. Change the gauze twice a day and in between if needed for drainage. Watch for fever, redness, or drainage of pus. If any of these occur, return to the ER.  Seroma A seroma is a collection of fluid that looks like swelling or a mass on the body. Seromas form on the body where tissue has been injured or cut. They are most common after surgeries. Seromas vary in size. Some are small and painless. Others may become large and cause pain or discomfort. Many seromas go away on their own; the fluid is naturally absorbed by the body. Some may require the fluid to be drained through medical procedures.  CAUSES  Seromas form as the result of damage to tissue or the removal of tissue. This tissue damage may occur during surgery or because of an injury or trauma. When tissue is disrupted or removed, empty space is created. The body's natural defense system causes fluid to enter the empty space and form a seroma. SYMPTOMS   Swelling at the site of a surgical cut (incision) or an injury.  Drainage of clear fluid at the surgery or injury site.  Possible discomfort or pain. DIAGNOSIS  Your health care provider will perform a physical exam. During the exam, the health care provider will press on the seroma using a hand or fingers (palpation). Various tests may be ordered to help confirm the diagnosis. These tests may include:  Blood tests.  Imaging tests such as ultrasonography or computed tomography (CT). TREATMENT  Sometimes seromas resolve on their own and drain naturally in the body. Your health care provider may monitor you to make sure the seroma does not cause any complications. If your seroma does not resolve on its own, treatment may include:  Using a needle to drain the fluid from the seroma (needle aspiration).  Inserting a flexible tube (catheter) to drain the fluid.  Applying a dressing, such as an elastic bandage or  binder.  Use of antibiotic medicines if the seroma becomes infected.  In rare cases, surgery may be done to remove the seroma and repair the area. HOME CARE INSTRUCTIONS  Follow your health care provider's instructions regarding activity levels and any limitations on movements.  Only take over-the-counter or prescription medicines as directed by your health care provider.  If your health care provider prescribes antibiotics, take them as directed. Finish them even if you start to feel better.  Check your seroma every day for redness, warmth, or yellow drainage.  Follow up with your health care provider as directed. SEEK MEDICAL CARE IF:  You develop a fever.  You have pain, tenderness, redness, or warmth at the site of the seroma.  You notice yellow drainage coming from the site of the seroma.  Your seroma is getting bigger. Document Released: 04/02/2013 Document Revised: 04/22/2014 Document Reviewed: 04/02/2013 [facility] Patient Information 2015 [place], [place]. This information is not intended to replace advice given to you by your health care provider. Make sure you discuss any questions you have with your health care provider.

## 2015-09-08 ENCOUNTER — Other Ambulatory Visit: Payer: Self-pay | Admitting: Family Medicine

## 2015-09-08 ENCOUNTER — Telehealth: Payer: Self-pay | Admitting: Family Medicine

## 2015-09-08 ENCOUNTER — Telehealth: Payer: Self-pay | Admitting: *Deleted

## 2015-09-08 NOTE — Telephone Encounter (Signed)
Patient was advised to call us and let Dr. Lonny Prude know that she had her hysterectomy on 09/04/15. Also, patient request a refill on her Zantac.

## 2015-09-08 NOTE — Telephone Encounter (Signed)
Message left on VM, returned call. Pt states I called on Saturday because my dressing came off after I was laughing.  The tape had been coming off and my wound had been draining and the glue was coming apart. I went to the emergency room and they redressed it."  Pt denied any needs at this time. Pt verbalized and confirmed next f/u 10/17 230pm

## 2015-09-09 NOTE — Telephone Encounter (Signed)
Refill responded to in another message.

## 2015-09-10 ENCOUNTER — Telehealth: Payer: Self-pay | Admitting: Gynecologic Oncology

## 2015-09-10 NOTE — Telephone Encounter (Signed)
Informed patient of stage IA grade 1 cancer with low risk features for recurrence. Adjuvant therapy not required. She states she is doing well postop. I will see her in 3 weeks for a postoperative visit. [name], MD

## 2015-09-19 DIAGNOSIS — H43813 Vitreous degeneration, bilateral: Secondary | ICD-10-CM | POA: Diagnosis not present

## 2015-09-19 DIAGNOSIS — H4011X4 Primary open-angle glaucoma, indeterminate stage: Secondary | ICD-10-CM | POA: Diagnosis not present

## 2015-09-20 DIAGNOSIS — H43813 Vitreous degeneration, bilateral: Secondary | ICD-10-CM | POA: Diagnosis not present

## 2015-09-20 DIAGNOSIS — H401134 Primary open-angle glaucoma, bilateral, indeterminate stage: Secondary | ICD-10-CM | POA: Diagnosis not present

## 2015-09-20 DIAGNOSIS — H5310 Unspecified subjective visual disturbances: Secondary | ICD-10-CM | POA: Diagnosis not present

## 2015-09-26 DIAGNOSIS — H209 Unspecified iridocyclitis: Secondary | ICD-10-CM | POA: Diagnosis not present

## 2015-10-01 ENCOUNTER — Telehealth: Payer: Self-pay | Admitting: Family Medicine

## 2015-10-01 ENCOUNTER — Ambulatory Visit (INDEPENDENT_AMBULATORY_CARE_PROVIDER_SITE_OTHER): Payer: Medicare Other | Admitting: Family Medicine

## 2015-10-01 VITALS — Temp 98.3°F | Wt 253.0 lb

## 2015-10-01 DIAGNOSIS — H8112 Benign paroxysmal vertigo, left ear: Secondary | ICD-10-CM

## 2015-10-01 DIAGNOSIS — K219 Gastro-esophageal reflux disease without esophagitis: Secondary | ICD-10-CM | POA: Diagnosis not present

## 2015-10-01 DIAGNOSIS — H0520 Unspecified exophthalmos: Secondary | ICD-10-CM

## 2015-10-01 DIAGNOSIS — E348 Other specified endocrine disorders: Secondary | ICD-10-CM

## 2015-10-01 LAB — TSH: TSH: 2.804 u[IU]/mL (ref 0.350–4.500)

## 2015-10-01 MED ORDER — ONDANSETRON HCL 4 MG PO TABS: 4.0000 mg | ORAL_TABLET | Freq: Three times a day (TID) | ORAL | Status: DC | PRN

## 2015-10-01 MED ORDER — MECLIZINE HCL 12.5 MG PO TABS
12.5000 mg | ORAL_TABLET | Freq: Three times a day (TID) | ORAL | Status: DC | PRN
Start: 2015-10-01 — End: 2016-01-05

## 2015-10-01 MED ORDER — ESOMEPRAZOLE MAGNESIUM 20 MG PO PACK: 20.0000 mg | PACK | Freq: Every day | ORAL | Status: DC

## 2015-10-01 NOTE — Progress Notes (Signed)
    Subjective    Roberta [name] is a 59 y.o. female that presents for a follow-up visit for chronic issues.   1. GERD: She reports increased amounts of waking up with coughing and stomach burning. She takes Zantac and watches what she eats.  2. Vertigo: This is a recurrent chronic problem. Patient woke up this morning with symptoms of room spinning. She would like a referral to physical therapy for Epley Maneuvers.  3. Exophthalmos: Seen on MRI. Patient worried that she has hyperthyroidism. No clinical symptoms. Patient worried that her pineal cyst is causing her eye issues, although symptoms currently being managed by ophthalmologist. No headaches.  Social History  Substance Use Topics  . Smoking status: Never Smoker   . Smokeless tobacco: Not on file  . Alcohol Use: No    Allergies  Allergen Reactions  . Influenza Vaccines Anaphylaxis    Throat swelling reported after flu shot in the past (cannot verify with records but would not give)  . Budesonide Other (See Comments)    Throat infection per patient  . Prednisone Other (See Comments)    increased vaginal bleeding, able to tolerate low dose (40 mg)  . Flonase [Fluticasone Propionate] Other (See Comments)    Upper respitory  issues  . Codeine Nausea And Vomiting  . Morphine And Related Nausea And Vomiting  . Nickel Rash    No orders of the defined types were placed in this encounter.    ROS  Per HPI   Objective   Temp(Src) 98.3 F (36.8 C) (Oral)  Wt 253 lb (114.76 kg)  General: Well appearing, no distress HEENT: EOMI, patient not tolerating eye light exam. No nystagmus.  Assessment and Plan    GERD (gastroesophageal reflux disease) Switch to Nexium low dose for trial. May need GI follow-up if this does not improve symptoms.  Benign paroxysmal positional vertigo Refer to physical therapy per patient request. Meclizine and Zofran refill.  Pineal gland cyst Patient asymptomatic but feels like recent issues  with eye is associated with this incidental finding. Eye issues most likely related to ongoing management s/p cataract surgery. Will refer to surgery since patient is requesting.   Exophthalmos Incidental finding on MRI. Patient worried about hyperthyroidism but has no symptoms. Will obtain a TSH

## 2015-10-01 NOTE — Assessment & Plan Note (Signed)
Patient asymptomatic but feels like recent issues with eye is associated with this incidental finding. Eye issues most likely related to ongoing management s/p cataract surgery. Will refer to surgery since patient is requesting.

## 2015-10-01 NOTE — Assessment & Plan Note (Signed)
Switch to Nexium low dose for trial. May need GI follow-up if this does not improve symptoms.

## 2015-10-01 NOTE — Telephone Encounter (Signed)
Pt wants to be sure Dr. Lonny Prude doesn't forget to do a neurology referral

## 2015-10-01 NOTE — Assessment & Plan Note (Signed)
Refer to physical therapy per patient request. Meclizine and Zofran refill.

## 2015-10-01 NOTE — Patient Instructions (Signed)
Thank you for coming to see me today. It was a pleasure. Today we talked about:   GERD: I will start you on PPI therapy. I am starting Nexium 20mg  daily for up to 8 weeks  Vertigo: I am going to give you some head movement exercises and refer you to physical therapy. I will refill your Meclizine and Zofran.   Please make an appointment to me in two months for follow-up.  If you have any questions or concerns, please do not hesitate to call the office at [phone].  Sincerely,  Cordelia Poche, MD

## 2015-10-01 NOTE — Assessment & Plan Note (Signed)
Incidental finding on MRI. Patient worried about hyperthyroidism but has no symptoms. Will obtain a TSH

## 2015-10-02 ENCOUNTER — Encounter: Payer: Self-pay | Admitting: Family Medicine

## 2015-10-06 ENCOUNTER — Ambulatory Visit: Payer: Medicare Other | Attending: Gynecologic Oncology | Admitting: Gynecologic Oncology

## 2015-10-06 ENCOUNTER — Encounter: Payer: Self-pay | Admitting: Physical Therapy

## 2015-10-06 ENCOUNTER — Encounter: Payer: Self-pay | Admitting: Gynecologic Oncology

## 2015-10-06 ENCOUNTER — Ambulatory Visit: Payer: Medicare Other | Attending: Family Medicine | Admitting: Physical Therapy

## 2015-10-06 ENCOUNTER — Encounter: Payer: Self-pay | Admitting: Family Medicine

## 2015-10-06 VITALS — BP 126/79 | HR 68 | Temp 98.4°F | Resp 20 | Ht 66.0 in | Wt 254.9 lb

## 2015-10-06 DIAGNOSIS — H8111 Benign paroxysmal vertigo, right ear: Secondary | ICD-10-CM

## 2015-10-06 DIAGNOSIS — Z90710 Acquired absence of both cervix and uterus: Secondary | ICD-10-CM

## 2015-10-06 DIAGNOSIS — C541 Malignant neoplasm of endometrium: Secondary | ICD-10-CM

## 2015-10-06 NOTE — Progress Notes (Signed)
POSTOPERATIVE FOLLOWUP: endometrial cancer  Assessment:    59 y.o. year old with Stage IA Grade 1 endometrioid endometrial cancer.   S/p robotic assisted total hysterectomy, BSO and bilateral SLN biopsy on 09/04/15. no LVSI, 5% myometrial invasion, neg pelvic washings and negative lymph nodes.   Plan: 1) Pathology reports reviewed today 2) Treatment counseling - Very low risk (<5%) for recurrence given age, grade, depth of myometrial invasion and LVSI status. Multidisciplinary tumor board recommendation is for routine surveillance with frequent pelvic exams and visits with annual pap smear.  We will start with visits every 6 months for 5 years, at which time she can return to annual visits.  Discussed signs and symptoms of recurrence including vaginal bleeding or discharge, leg pain or swelling and changes in bowel or bladder habits. She was given the opportunity to ask questions, which were answered to her satisfaction, and she is agreement with the above mentioned plan of care.  3)  Return to clinic in 6 months to see me, then 12 months with Dr Leo Grosser.   HPI:  Roberta [name] is a 59 y.o. year old [id number] initially seen in consultation on 08/11/15 for endometrial cancer.  She then underwent a robotic hysterectomy, BSO and bilateral sentinel node biopsy on 01/18/85 without complications.  Her postoperative course was uncomplicated.  Her final pathologic diagnosis is a Stage IA Grade 1 endometrioid endometrial cancer with no lymphovascular space invasion, 1/17 mm (5%) of myometrial invasion and negative lymph nodes (SLN's were removed from bilateral basins, however, no nodal tissue was removed from the left SLN specimen, therefore nodal evaluation was only complete on the right).  She is seen today for a postoperative check and to discuss her pathology results and treatment plan.  Since discharge from the hospital, she is feeling well.  She has improving appetite, normal bowel and bladder function,  and pain controlled with minimal PO medication. She has no other complaints today.  Current Outpatient Prescriptions on File Prior to Visit  Medication Sig Dispense Refill  . albuterol (PROAIR HFA) 108 (90 BASE) MCG/ACT inhaler Inhale 2 puffs into the lungs every 4 (four) hours as needed. (Patient taking differently: Inhale 2 puffs into the lungs every 4 (four) hours as needed for wheezing or shortness of breath. ) 2 Inhaler 11  . albuterol (PROVENTIL) (2.5 MG/3ML) 0.083% nebulizer solution Take 3 mLs (2.5 mg total) by nebulization every 6 (six) hours as needed for wheezing or shortness of breath. 75 mL 1  . aspirin 81 MG tablet Take 81 mg by mouth daily.    . calcium carbonate (OS-CAL) 600 MG TABS tablet Take 600 mg by mouth daily with breakfast.    . Cholecalciferol (VITAMIN D-3) 1000 UNITS CAPS Take 3,000 Units by mouth every morning.     . cyclopentolate (CYCLODRYL,CYCLOGYL) 1 % ophthalmic solution Place 1 drop into both eyes daily.     . dorzolamide (TRUSOPT) 2 % ophthalmic solution Place 1 drop into both eyes 2 (two) times daily.     . ferrous sulfate 325 (65 FE) MG EC tablet Take 325 mg by mouth daily with breakfast.     . hydrochlorothiazide (HYDRODIURIL) 25 MG tablet TAKE 1 TABLET BY MOUTH DAILY (Patient taking differently: TAKE 1 TABLET BY MOUTH DAILY IN THE MORNING) 90 tablet 0  . ketorolac (ACULAR) 0.5 % ophthalmic solution Place 1 drop into both eyes 3 (three) times daily.    Marland Kitchen lisinopril (PRINIVIL,ZESTRIL) 5 MG tablet Take 1 tablet (5 mg total) by mouth  daily. 90 tablet 3  . Magnesium 250 MG TABS Take 250 mg by mouth every morning.    . meclizine (ANTIVERT) 12.5 MG tablet Take 1 tablet (12.5 mg total) by mouth 3 (three) times daily as needed for dizziness. 30 tablet 1  . Multiple Vitamin (MULTIVITAMIN) tablet Take 1 tablet by mouth every morning.     . Multiple Vitamins-Minerals (HAIR SKIN AND NAILS FORMULA PO) Take 1 tablet by mouth every morning.    . ondansetron (ZOFRAN) 4 MG  tablet Take 1 tablet (4 mg total) by mouth every 8 (eight) hours as needed for nausea or vomiting. 20 tablet 0  . prednisoLONE acetate (PRED FORTE) 1 % ophthalmic suspension Place 1 drop into both eyes 3 (three) times daily.     . psyllium (METAMUCIL) 58.6 % powder Take 1 packet by mouth 3 (three) times daily.    . ranitidine (ZANTAC) 150 MG tablet TAKE 1 TABLET BY MOUTH TWICE DAILY 60 tablet 0  . Travoprost, BAK Free, (TRAVATAN) 0.004 % SOLN ophthalmic solution Place 1 drop into both eyes at bedtime. 2.5 mL 3  . cyclobenzaprine (FLEXERIL) 5 MG tablet Take 5 mg by mouth 3 (three) times daily as needed for muscle spasms.    . traMADol (ULTRAM) 50 MG tablet Take 50 mg by mouth every 6 (six) hours as needed for moderate pain.      No current facility-administered medications on file prior to visit.   Allergies  Allergen Reactions  . Influenza Vaccines Anaphylaxis    Throat swelling reported after flu shot in the past (cannot verify with records but would not give)  . Budesonide Other (See Comments)    Throat infection per patient  . Prednisone Other (See Comments)    increased vaginal bleeding, able to tolerate low dose (40 mg)  . Flonase [Fluticasone Propionate] Other (See Comments)    Upper respitory  issues  . Codeine Nausea And Vomiting  . Morphine And Related Nausea And Vomiting  . Nickel Rash   Past Medical History  Diagnosis Date  . Anemia   . Asthma   . Depression   . Hyperlipidemia   . Hypertension   . ALLERGIC RHINITIS 10/14/2010  . ASTHMA, INTERMITTENT 02/16/2007  . BACK PAIN, CHRONIC 03/25/2009  . CERVICAL RADICULOPATHY 10/16/2009  . DEPRESSIVE DISORDER NOT ELSEWHERE CLASSIFIED 03/25/2009  . GERD (gastroesophageal reflux disease) 09/09/2011  . Glaucoma     sees optho every 3 months, drops each night  . Postmenopausal vaginal bleeding 09/24/2014  . PONV (postoperative nausea and vomiting)   . Abnormal EKG     nonspecific ST and T-wave changes - Followed by Dr.Berry  . DDD  (degenerative disc disease), lumbosacral   . Endometrial cancer [facility])    Past Surgical History  Procedure Laterality Date  . Cervical spine surgery    . Rotator cuff repair Right May 2014  . Cholecystectomy  1980  . Bunionectomy      bilateral and toe correction  . Knee arthroscopy    . Ankle surgery    . Breast cyst incision and drainage      under breast  . Dilatation & currettage/hysteroscopy with resectocope N/A 07/28/2015    Procedure: [place];  Surgeon: Eldred Manges, MD;  Location: [facility];  Service: Gynecology;  Laterality: N/A;  . Biopsy breast      LEFT  . Cataract surgery    . Carpal tunnel release Bilateral 2009  . Robotic assisted total hysterectomy with bilateral  salpingo oopherectomy Bilateral 09/04/2015    Procedure: ROBOTIC ASSISTED HYSTERECTOMY WITH BILATERAL SALPINGO OOPHORECTOMY SENTINAL NODE MAPPING ;  Surgeon: Everitt Amber, MD;  Location: [facility];  Service: Gynecology;  Laterality: Bilateral;   Family History  Problem Relation Age of Onset  . Heart disease Mother     MI in 51s  . Lymphoma Mother     related to asbestos  . Cancer Mother     lymphoma (asbestos exposure)  . Alcoholism Father   . Cirrhosis Father     due to alcohol  . Asthma Father   . Cancer Maternal Uncle     lung  . Cancer Maternal Uncle     lung   Social History   Social History  . Marital Status: Divorced    Spouse Name: N/A  . Number of Children: 4  . Years of Education: 134   Occupational History  . DISABLED   . Previously a CNA    Social History Main Topics  . Smoking status: Never Smoker   . Smokeless tobacco: Not on file  . Alcohol Use: No  . Drug Use: No  . Sexual Activity: No   Other Topics Concern  . 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 Part time student due to learning disability. 4  kids.       On disability.                     Review of systems: Constitutional:  She has no weight gain or weight loss. She has no fever or chills. Eyes: No blurred vision Ears, Nose, Mouth, Throat: No dizziness, headaches or changes in hearing. No mouth sores. Cardiovascular: No chest pain, palpitations or edema. Respiratory:  No shortness of breath, wheezing or cough Gastrointestinal: She has normal bowel movements without diarrhea or constipation. She denies any nausea or vomiting. She denies blood in her stool or heart burn. Genitourinary:  She denies pelvic pain, pelvic pressure or changes in her urinary function. She has no hematuria, dysuria, or incontinence. She has no irregular vaginal bleeding or vaginal discharge Musculoskeletal: Denies muscle weakness or joint pains.  Skin:  She has no skin changes, rashes or itching Neurological:  Denies dizziness or headaches. No neuropathy, no numbness or tingling. Psychiatric:  She denies depression or anxiety. Hematologic/Lymphatic:   No easy bruising or bleeding   Physical Exam: Blood pressure 126/79, pulse 68, temperature 98.4 F (36.9 C), temperature source Oral, resp. rate 20, height 5\' 6"  (1.676 m), weight 254 lb 14.4 oz (115.622 kg), SpO2 100 %. General: Well dressed, well nourished in no apparent distress.   HEENT:  Normocephalic and atraumatic, no lesions.  Extraocular muscles intact. Sclerae anicteric. Pupils equal, round, reactive. No mouth sores or ulcers. Thyroid is normal size, not nodular, midline. Skin:  No lesions or rashes. Abdomen:  Soft, nontender, nondistended.  No palpable masses.  No hepatosplenomegaly.  No ascites. Normal bowel sounds.  No hernias.  Incisions are well healed. Genitourinary: Normal EGBUS  Vaginal cuff intact.  No bleeding or discharge.  No cul de sac fullness. Extremities: No cyanosis, clubbing or edema.  No calf tenderness or erythema. No palpable cords. Psychiatric: Mood and affect are  appropriate. Neurological: Awake, alert and oriented x 3. Sensation is intact, no neuropathy.  Musculoskeletal: No pain, normal strength and range of motion.  [name],  MD

## 2015-10-06 NOTE — Patient Instructions (Signed)
Plan to follow up with Dr. Denman George in April or sooner if needed and Dr. Leo Grosser in one year from now.  You may begin your aerobic exercises now as tolerated.  Please call for any questions or concerns.

## 2015-10-07 ENCOUNTER — Encounter: Payer: Self-pay | Admitting: Physical Therapy

## 2015-10-07 NOTE — Patient Instructions (Addendum)
Sit to Side-Lying    Sit on edge of bed. 1. Turn head 45 to right. 2. Maintain head position and lie down slowly on left side. Hold until symptoms subside. 3. Sit up slowly. Hold until symptoms subside. 4. Turn head 45 to left. 5. Maintain head position and lie down slowly on right side. Hold until symptoms subside. 6. Sit up slowly. Repeat sequence __5__ times per session. Do _5___ sessions per day.  Copyright  VHI. All rights reserved.  Benign Positional Vertigo Vertigo is the feeling that you or your surroundings are moving when they are not. Benign positional vertigo is the most common form of vertigo. The cause of this condition is not serious (is benign). This condition is triggered by certain movements and positions (is positional). This condition can be dangerous if it occurs while you are doing something that could endanger you or others, such as driving.  CAUSES In many cases, the cause of this condition is not known. It may be caused by a disturbance in an area of the inner ear that helps your brain to sense movement and balance. This disturbance can be caused by a viral infection (labyrinthitis), head injury, or repetitive motion. RISK FACTORS This condition is more likely to develop in:  Women.  People who are 5 years of age or older. SYMPTOMS Symptoms of this condition usually happen when you move your head or your eyes in different directions. Symptoms may start suddenly, and they usually last for less than a minute. Symptoms may include:  Loss of balance and falling.  Feeling like you are spinning or moving.  Feeling like your surroundings are spinning or moving.  Nausea and vomiting.  Blurred vision.  Dizziness.  Involuntary eye movement (nystagmus). Symptoms can be mild and cause only slight annoyance, or they can be severe and interfere with daily life. Episodes of benign positional vertigo may return (recur) over time, and they may be triggered by certain  movements. Symptoms may improve over time. DIAGNOSIS This condition is usually diagnosed by medical history and a physical exam of the head, neck, and ears. You may be referred to a health care provider who specializes in ear, nose, and throat (ENT) problems (otolaryngologist) or a provider who specializes in disorders of the nervous system (neurologist). You may have additional testing, including:  MRI.  A CT scan.  Eye movement tests. Your health care provider may ask you to change positions quickly while he or she watches you for symptoms of benign positional vertigo, such as nystagmus. Eye movement may be tested with an electronystagmogram (ENG), caloric stimulation, the Dix-Hallpike test, or the roll test.  An electroencephalogram (EEG). This records electrical activity in your brain.  Hearing tests. TREATMENT Usually, your health care provider will treat this by moving your head in specific positions to adjust your inner ear back to normal. Surgery may be needed in severe cases, but this is rare. In some cases, benign positional vertigo may resolve on its own in 2-4 weeks. HOME CARE INSTRUCTIONS Safety  Move slowly.Avoid sudden body or head movements.  Avoid driving.  Avoid operating heavy machinery.  Avoid doing any tasks that would be dangerous to you or others if a vertigo episode would occur.  If you have trouble walking or keeping your balance, try using a cane for stability. If you feel dizzy or unstable, sit down right away.  Return to your normal activities as told by your health care provider. Ask your health care provider what activities  are safe for you. General Instructions  Take over-the-counter and prescription medicines only as told by your health care provider.  Avoid certain positions or movements as told by your health care provider.  Drink enough fluid to keep your urine clear or pale yellow.  Keep all follow-up visits as told by your health care  provider. This is important. SEEK MEDICAL CARE IF:  You have a fever.  Your condition gets worse or you develop new symptoms.  Your family or friends notice any behavioral changes.  Your nausea or vomiting gets worse.  You have numbness or a "pins and needles" sensation. SEEK IMMEDIATE MEDICAL CARE IF:  You have difficulty speaking or moving.  You are always dizzy.  You faint.  You develop severe headaches.  You have weakness in your legs or arms.  You have changes in your hearing or vision.  You develop a stiff neck.  You develop sensitivity to light.   This information is not intended to replace advice given to you by your health care provider. Make sure you discuss any questions you have with your health care provider.   Document Released: 09/13/2006 Document Revised: 08/27/2015 Document Reviewed: 03/31/2015 Elsevier Interactive Patient Education [employer].

## 2015-10-07 NOTE — Therapy (Signed)
[facility] [address] [place], [place], [address] Phone: [phone]   Fax:  [phone]  Physical Therapy Evaluation  Patient Details  Name: Roberta [name] MRN: [id number] Date of Birth: 1956/04/13 Referring Provider: Dr. Cordelia Poche  Encounter Date: 10/06/2015      PT End of Session - 10/07/15 0952    Visit Number 1   Number of Visits 4   Date for PT Re-Evaluation 11/06/15   Authorization Type [employer] Medicare   PT Start Time 0845   PT Stop Time 0930   PT Time Calculation (min) 45 min      Past Medical History  Diagnosis Date  . Anemia   . Asthma   . Depression   . Hyperlipidemia   . Hypertension   . ALLERGIC RHINITIS 10/14/2010  . ASTHMA, INTERMITTENT 02/16/2007  . BACK PAIN, CHRONIC 03/25/2009  . CERVICAL RADICULOPATHY 10/16/2009  . DEPRESSIVE DISORDER NOT ELSEWHERE CLASSIFIED 03/25/2009  . GERD (gastroesophageal reflux disease) 09/09/2011  . Glaucoma     sees optho every 3 months, drops each night  . Postmenopausal vaginal bleeding 09/24/2014  . PONV (postoperative nausea and vomiting)   . Abnormal EKG     nonspecific ST and T-wave changes - Followed by Dr.Berry  . DDD (degenerative disc disease), lumbosacral   . Endometrial cancer [facility])     Past Surgical History  Procedure Laterality Date  . Cervical spine surgery    . Rotator cuff repair Right May 2014  . Cholecystectomy  1980  . Bunionectomy      bilateral and toe correction  . Knee arthroscopy    . Ankle surgery    . Breast cyst incision and drainage      under breast  . Dilatation & currettage/hysteroscopy with resectocope N/A 07/28/2015    Procedure: [place];  Surgeon: Eldred Manges, MD;  Location: [facility];  Service: Gynecology;  Laterality: N/A;  . Biopsy breast      LEFT  . Cataract surgery    . Carpal tunnel release Bilateral 2009  . Robotic assisted total hysterectomy with bilateral  salpingo oopherectomy Bilateral 09/04/2015    Procedure: ROBOTIC ASSISTED HYSTERECTOMY WITH BILATERAL SALPINGO OOPHORECTOMY SENTINAL NODE MAPPING ;  Surgeon: Everitt Amber, MD;  Location: [facility];  Service: Gynecology;  Laterality: Bilateral;    There were no vitals filed for this visit.  Visit Diagnosis:  BPPV (benign paroxysmal positional vertigo), right - Plan: PT plan of care cert/re-cert      Subjective Assessment - 10/07/15 0944    Subjective Pt. reports having episodes of vertigo which appear worse with change in seasons; states also worse with contracting a cold/virus; pt reports having taken Antivert last  Wed. -Thurs which was a "bad day"; pt states it is not as bad this am but she feels it swirling around in her head; reports having had nausea with it but has taken medication to prevent the vomiting                                                                                Patient Stated Goals resolve the vertigo   Currently in Pain? No/denies  [facility] PT Assessment - 10/07/15 0001    Assessment   Medical Diagnosis BPPV   Referring Provider Dr. Cordelia Poche   Onset Date/Surgical Date 10/01/15   Prior Therapy Sept. 2015   Balance Screen   Has the patient fallen in the past 6 months No   Has the patient had a decrease in activity level because of a fear of falling?  No   Is the patient reluctant to leave their home because of a fear of falling?  No   Prior Function   Level of Independence Independent            Vestibular Assessment - 10/07/15 0001    Vestibular Assessment   General Observation pt is a 59 year old lady with c/o mild intermittent vertigo that occurs with changing positions or with looking up   Symptom Behavior   Type of Dizziness Spinning   Frequency of Dizziness varies   Duration of Dizziness seconds   Aggravating Factors Turning head quickly;Rolling to right   Relieving Factors Lying supine   Occulomotor Exam   Occulomotor Alignment  Normal   Smooth Pursuits Intact   Saccades Intact   Positional Testing   Dix-Hallpike Dix-Hallpike Right;Dix-Hallpike Left   Sidelying Test Sidelying Right;Sidelying Left   Dix-Hallpike Right   Dix-Hallpike Right Duration 10-12 seconds   Dix-Hallpike Right Symptoms Upbeat, right rotatory nystagmus   Dix-Hallpike Left   Dix-Hallpike Left Duration none   Dix-Hallpike Left Symptoms No nystagmus   Sidelying Right   Sidelying Right Duration 10 seconds   Sidelying Right Symptoms Upbeat, right rotatory nystagmus   Sidelying Left   Sidelying Left Duration 2 seconds   Sidelying Left Symptoms No nystagmus                       PT Education - 10/07/15 0951    Education provided Yes   Education Details Brandt-Daroff exercises; BPPV information   Person(s) Educated Patient   Methods Explanation;Demonstration;Handout   Comprehension Verbalized understanding;Returned demonstration          PT Short Term Goals - 10/07/15 0956    PT SHORT TERM GOAL #1   Title Pt. will have a negative R Dix-Hallpike test with no nystagmus and no c/o vertigo to indicate that R BPPV has resolved.  (11-06-15)   Time 4   Period Weeks   Status New   PT SHORT TERM GOAL #2   Title Pt will report no vertigo with any bed mobility.  (11-06-15)   Time 4   Period Weeks   Status New   PT SHORT TERM GOAL #3   Title Independent in HEP for habituation prn.  (11-06-15)   Time 4   Period Weeks   Status New           PT Long Term Goals - 10/07/15 4742    PT LONG TERM GOAL #1   Title same as STG's as ELOS = 4 weeks               Plan - 10/07/15 0953    Clinical Impression Statement Pt has signs and symptoms consistent with R BPPV with rotary nystgamus and c/o vertigo; appeared to be mostly resolved on 3rd rep of Epley's maneuver   Pt will benefit from skilled therapeutic intervention in order to improve on the following deficits Dizziness;Difficulty walking;Decreased balance   Rehab  Potential Good   PT Frequency 1x / week   PT Duration 4 weeks  PT Treatment/Interventions ADLs/Self Care Home Management;Functional mobility training;Balance training;Neuromuscular re-education;Patient/family education;Vestibular;Canalith Repostioning;Therapeutic exercise;Therapeutic activities   PT Next Visit Plan recheck R Dix-Hallpike  - Epley's prn   PT Home Exercise Plan Brandt-Daroff   Consulted and Agree with Plan of Care Patient          G-Codes - 10-21-2015 0958    Functional Assessment Tool Used Clinical judgment   Functional Limitation Other PT primary   Other PT Primary Current Status (V3748) CJ   Other PT Primary Goal Status (O7078) CI       Problem List Patient Active Problem List   Diagnosis Date Noted  . Pineal gland cyst 10/01/2015  . Exophthalmos 10/01/2015  . Endometrial cancer ([place]) 08/11/2015  . Postmenopausal bleeding 07/28/2015  . Atypical endometrial hyperplasia 07/28/2015  . Abnormal EKG 07/21/2015  . Benign paroxysmal positional vertigo 09/24/2014  . Routine health maintenance 04/24/2014  . Uterine fibroid 01/04/2014  . Cervical disc disorder with radiculopathy of cervical region 10/24/2013  . Glaucoma   . GERD (gastroesophageal reflux disease) 09/09/2011  . Gastric polyp 09/09/2011  . ALLERGIC RHINITIS 10/14/2010  . BREAST MASS, BENIGN 02/04/2010  . OTHER POSTSURGICAL STATUS OTHER 10/16/2009  . Depression 03/25/2009  . BACK PAIN, CHRONIC 03/25/2009  . HYPERLIPIDEMIA 02/16/2007  . OBESITY, NOS 02/16/2007  . ANEMIA, OTHER, UNSPECIFIED 02/16/2007  . HYPERTENSION, BENIGN SYSTEMIC 02/16/2007  . Asthma 02/16/2007    Alda Lea, PT 10/07/2015, 10:07 AM  [facility] [address] [place], [place], [address] Phone: [phone]   Fax:  [phone]  Name: Roberta [name] MRN: [id number] Date of Birth: [birth year]

## 2015-10-13 ENCOUNTER — Ambulatory Visit: Payer: Medicare Other | Admitting: Physical Therapy

## 2015-10-14 ENCOUNTER — Ambulatory Visit: Payer: Medicare Other | Admitting: Physical Therapy

## 2015-10-14 DIAGNOSIS — H8111 Benign paroxysmal vertigo, right ear: Secondary | ICD-10-CM | POA: Diagnosis not present

## 2015-10-15 ENCOUNTER — Other Ambulatory Visit: Payer: Self-pay | Admitting: Family Medicine

## 2015-10-15 ENCOUNTER — Encounter: Payer: Self-pay | Admitting: Physical Therapy

## 2015-10-15 NOTE — Therapy (Signed)
[facility] [address] [place], [place], [address] Phone: [phone]   Fax:  [phone]  Physical Therapy Treatment  Patient Details  Name: Roberta [name] MRN: [id number] Date of Birth: 1956-02-11 Referring Provider: Dr. Cordelia Poche  Encounter Date: 10/14/2015      PT End of Session - 10/15/15 1028    Visit Number 2   Number of Visits 4   Date for PT Re-Evaluation 11/06/15   Authorization Type [employer] Medicare   PT Start Time 0848   PT Stop Time 0930   PT Time Calculation (min) 42 min      Past Medical History  Diagnosis Date  . Anemia   . Asthma   . Depression   . Hyperlipidemia   . Hypertension   . ALLERGIC RHINITIS 10/14/2010  . ASTHMA, INTERMITTENT 02/16/2007  . BACK PAIN, CHRONIC 03/25/2009  . CERVICAL RADICULOPATHY 10/16/2009  . DEPRESSIVE DISORDER NOT ELSEWHERE CLASSIFIED 03/25/2009  . GERD (gastroesophageal reflux disease) 09/09/2011  . Glaucoma     sees optho every 3 months, drops each night  . Postmenopausal vaginal bleeding 09/24/2014  . PONV (postoperative nausea and vomiting)   . Abnormal EKG     nonspecific ST and T-wave changes - Followed by Dr.Berry  . DDD (degenerative disc disease), lumbosacral   . Endometrial cancer [facility])     Past Surgical History  Procedure Laterality Date  . Cervical spine surgery    . Rotator cuff repair Right May 2014  . Cholecystectomy  1980  . Bunionectomy      bilateral and toe correction  . Knee arthroscopy    . Ankle surgery    . Breast cyst incision and drainage      under breast  . Dilatation & currettage/hysteroscopy with resectocope N/A 07/28/2015    Procedure: [place];  Surgeon: Eldred Manges, MD;  Location: [facility];  Service: Gynecology;  Laterality: N/A;  . Biopsy breast      LEFT  . Cataract surgery    . Carpal tunnel release Bilateral 2009  . Robotic assisted total hysterectomy with bilateral  salpingo oopherectomy Bilateral 09/04/2015    Procedure: ROBOTIC ASSISTED HYSTERECTOMY WITH BILATERAL SALPINGO OOPHORECTOMY SENTINAL NODE MAPPING ;  Surgeon: Everitt Amber, MD;  Location: [facility];  Service: Gynecology;  Laterality: Bilateral;    There were no vitals filed for this visit.  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 No/denies                Vestibular Assessment - 10/15/15 0001    Positional Testing   Dix-Hallpike Dix-Hallpike Right;Dix-Hallpike Left   Sidelying Test Sidelying Right;Sidelying Left   Dix-Hallpike Right   Dix-Hallpike Right Duration 4-5 secs   Dix-Hallpike Right Symptoms Upbeat, right rotatory nystagmus   Sidelying Right   Sidelying Right Duration 6-7 secs   Sidelying Right Symptoms Right nystagmus       Epley maneuver performed 2 reps for R BPPV; no nystagmus and  no c/o vertigo reported on 2nd rep Reviewed Brandt-Daroff exercises - instructed pt to do these only if vertigo not fully resolved within next 1-2 days with  instruction to assess status upon initial awakening, as this is time that BPPV is usually the worst - pt verbalizes understanding                  PT Education - 10/15/15 1027    Education provided Yes   Education Details reviewed Brandt-Daroff exercises   Person(s) Educated Patient   Methods Explanation;Demonstration   Comprehension Verbalized understanding          PT Short Term Goals - 10/15/15 1030    PT SHORT TERM GOAL #1   Title Pt. will have a negative R  Dix-Hallpike test with no nystagmus and no c/o vertigo to indicate that R BPPV has resolved.  (11-06-15)   Baseline met 11/12/2015 on 2nd rep of Epley's   Status Achieved   PT SHORT TERM GOAL #2   Title Pt will report no vertigo with any bed mobility.  (11-06-15)   Baseline met 2015-11-12   Status Achieved   PT SHORT TERM GOAL #3   Title Independent in HEP for habituation prn.  (11-06-15)   Baseline met 11-12-15   Status Achieved           PT Long Term Goals - 10/07/15 8546    PT LONG TERM GOAL #1   Title same as STG's as ELOS = 4 weeks               Plan - 10/15/15 1028    Clinical Impression Statement Pt had no signs/symptoms of BPPV on 2nd rep of Epley's maneuver; minimal nystagmus observed in first position of Epley's on 1st rep; pt states she feels 95% better - does not feel that additional PT appt is needed   Pt will benefit from skilled therapeutic intervention in order to improve on the following deficits Dizziness;Difficulty walking;Decreased balance   Rehab Potential Good   PT Frequency 1x / week   PT Duration 4 weeks   PT Treatment/Interventions ADLs/Self Care Home Management;Functional mobility training;Balance training;Neuromuscular re-education;Patient/family education;Vestibular;Canalith Repostioning;Therapeutic exercise;Therapeutic activities   PT Next Visit Plan D/C due to vertigo resolved   PT Home Exercise Plan Brandt-Daroff   Consulted and Agree with Plan of Care Patient          G-Codes - 11/12/2015 1031    Functional Assessment Tool Used no signs/symptoms of BPPV on 2nd rep of Epley's - just mild c/o light-headedness with return to upright   Functional Limitation Other PT primary   Other PT Primary Goal Status (E7035) At least 1 percent but less than 20 percent impaired, limited or restricted   Other PT Primary Discharge Status [phone]) At least 1 percent but less than 20 percent impaired, limited or restricted     PHYSICAL THERAPY DISCHARGE  SUMMARY  Visits from Start of Care: 2  Current functional level related to goals / functional outcomes: See above for progress towards goals   Remaining deficits: BPPV appears to be resolved - pt reported no true vertigo during 2nd rep of Epley maneuver, just some mild light-headedess With return to upright position which quickly resolved   Education / Equipment: Pt has been educated in etiology of BPPV and Brandt-Daroff exercises for habituation should BPPV re-occur in the future (for Self-treatment) - pt verbalizes understanding  Plan: Patient agrees to discharge.  Patient goals were met. Patient is being discharged due  to meeting the stated rehab goals.  ?????      Pt reports she feels almost 100% better and does not feel that another PT appt is needed at this time.    Problem List Patient Active Problem List   Diagnosis Date Noted  . Pineal gland cyst 10/01/2015  . Exophthalmos 10/01/2015  . Endometrial cancer ([place]) 08/11/2015  . Postmenopausal bleeding 07/28/2015  . Atypical endometrial hyperplasia 07/28/2015  . Abnormal EKG 07/21/2015  . Benign paroxysmal positional vertigo 09/24/2014  . Routine health maintenance 04/24/2014  . Uterine fibroid 01/04/2014  . Cervical disc disorder with radiculopathy of cervical region 10/24/2013  . Glaucoma   . GERD (gastroesophageal reflux disease) 09/09/2011  . Gastric polyp 09/09/2011  . ALLERGIC RHINITIS 10/14/2010  . BREAST MASS, BENIGN 02/04/2010  . OTHER POSTSURGICAL STATUS OTHER 10/16/2009  . Depression 03/25/2009  . BACK PAIN, CHRONIC 03/25/2009  . HYPERLIPIDEMIA 02/16/2007  . OBESITY, NOS 02/16/2007  . ANEMIA, OTHER, UNSPECIFIED 02/16/2007  . HYPERTENSION, BENIGN SYSTEMIC 02/16/2007  . Asthma 02/16/2007    Alda Lea, PT 10/15/2015, 10:34 AM  [facility] [address] [place], [place], [address] Phone: [phone]   Fax:   [phone]  Name: Roberta [name] MRN: [id number] Date of Birth: February 10, 1956

## 2015-10-15 NOTE — Telephone Encounter (Signed)
HCTZ refilled. Did not refill Zantac since this treatment was switched at last visit.

## 2015-10-24 DIAGNOSIS — M47816 Spondylosis without myelopathy or radiculopathy, lumbar region: Secondary | ICD-10-CM | POA: Diagnosis not present

## 2015-10-27 MED ORDER — RANITIDINE HCL 150 MG PO TABS: 150.0000 mg | ORAL_TABLET | Freq: Two times a day (BID) | ORAL | Status: DC

## 2015-10-27 MED ORDER — HYDROCHLOROTHIAZIDE 25 MG PO TABS: 25.0000 mg | ORAL_TABLET | Freq: Every day | ORAL | Status: DC

## 2015-11-02 ENCOUNTER — Encounter (HOSPITAL_COMMUNITY): Payer: Self-pay | Admitting: Emergency Medicine

## 2015-11-02 ENCOUNTER — Emergency Department (INDEPENDENT_AMBULATORY_CARE_PROVIDER_SITE_OTHER)
Admission: EM | Admit: 2015-11-02 | Discharge: 2015-11-02 | Disposition: A | Discharge status: Home / Self Care | Payer: Medicare Other | Source: Home / Self Care

## 2015-11-02 DIAGNOSIS — I8393 Asymptomatic varicose veins of bilateral lower extremities: Secondary | ICD-10-CM

## 2015-11-02 DIAGNOSIS — I8390 Asymptomatic varicose veins of unspecified lower extremity: Secondary | ICD-10-CM

## 2015-11-02 MED ORDER — DICLOFENAC POTASSIUM 50 MG PO TABS: 50.0000 mg | ORAL_TABLET | Freq: Three times a day (TID) | ORAL | Status: DC

## 2015-11-02 NOTE — ED Notes (Signed)
C/o left leg pain onset 2-3 days; want to make sure it's not a clot vs varicose veins Also recalls grandchild might have kicked her Steady gait; NAD A&O x4.... No acute distress.

## 2015-11-02 NOTE — Discharge Instructions (Signed)
Varicose Veins Varicose veins are veins that have become enlarged and twisted. They are usually seen in the legs but can occur in other parts of the body as well. CAUSES This condition is the result of valves in the veins not working properly. Valves in the veins help to return blood from the leg to the heart. If these valves are damaged, blood flows backward and backs up into the veins in the leg near the skin. This causes the veins to become larger. RISK FACTORS People who are on their feet a lot, who are pregnant, or who are overweight are more likely to develop varicose veins. SIGNS AND SYMPTOMS  Bulging, twisted-appearing, bluish veins, most commonly found on the legs.  Leg pain or a feeling of heaviness. These symptoms may be worse at the end of the day.  Leg swelling.  Changes in skin color. DIAGNOSIS A health care provider can usually diagnose varicose veins by examining your legs. Your health care provider may also recommend an ultrasound of your leg veins. TREATMENT Most varicose veins can be treated at home.However, other treatments are available for people who have persistent symptoms or want to improve the cosmetic appearance of the varicose veins. These treatment options include:  Sclerotherapy. A solution is injected into the vein to close it off.  Laser treatment. A laser is used to heat the vein to close it off.  Radiofrequency vein ablation. An electrical current produced by radio waves is used to close off the vein.  Phlebectomy. The vein is surgically removed through small incisions made over the varicose vein.  Vein ligation and stripping. The vein is surgically removed through incisions made over the varicose vein after the vein has been tied (ligated). HOME CARE INSTRUCTIONS  Do not stand or sit in one position for long periods of time. Do not sit with your legs crossed. Rest with your legs raised during the day.  Wear compression stockings as directed by your  health care provider. These stockings help to prevent blood clots and reduce swelling in your legs.  Do not wear other tight, encircling garments around your legs, pelvis, or waist.  Walk as much as possible to increase blood flow.  Raise the foot of your bed at night with 2-inch blocks.  If you get a cut in the skin over the vein and the vein bleeds, lie down with your leg raised and press on it with a clean cloth until the bleeding stops. Then place a bandage (dressing) on the cut. See your health care provider if it continues to bleed. SEEK MEDICAL CARE IF:  The skin around your ankle starts to break down.  You have pain, redness, tenderness, or hard swelling in your leg over a vein.  You are uncomfortable because of leg pain.   This information is not intended to replace advice given to you by your health care provider. Make sure you discuss any questions you have with your health care provider.   Document Released: 09/15/2005 Document Revised: 12/27/2014 Document Reviewed: 04/23/2014 Elsevier Interactive Patient Education 2016 [employer] Inc[employer]  

## 2015-11-02 NOTE — ED Provider Notes (Signed)
CSN: [id number]     Arrival date & time 11/02/15  1300 History   None    Chief Complaint  Patient presents with  . Leg Pain   (Consider location/radiation/quality/duration/timing/severity/associated sxs/prior Treatment) HPI History obtained from patient:   LOCATION:left calf SEVERITY: DURATION:2-3 days CONTEXT:noticed after striking calf and being kicked by her grandson QUALITY: MODIFYING FACTORS:antiinflammatories ASSOCIATED SYMPTOMS:none TIMING:constant OCCUPATION:  Past Medical History  Diagnosis Date  . Anemia   . Asthma   . Depression   . Hyperlipidemia   . Hypertension   . ALLERGIC RHINITIS 10/14/2010  . ASTHMA, INTERMITTENT 02/16/2007  . BACK PAIN, CHRONIC 03/25/2009  . CERVICAL RADICULOPATHY 10/16/2009  . DEPRESSIVE DISORDER NOT ELSEWHERE CLASSIFIED 03/25/2009  . GERD (gastroesophageal reflux disease) 09/09/2011  . Glaucoma     sees optho every 3 months, drops each night  . Postmenopausal vaginal bleeding 09/24/2014  . PONV (postoperative nausea and vomiting)   . Abnormal EKG     nonspecific ST and T-wave changes - Followed by Dr.Berry  . DDD (degenerative disc disease), lumbosacral   . Endometrial cancer [facility])    Past Surgical History  Procedure Laterality Date  . Cervical spine surgery    . Rotator cuff repair Right May 2014  . Cholecystectomy  1980  . Bunionectomy      bilateral and toe correction  . Knee arthroscopy    . Ankle surgery    . Breast cyst incision and drainage      under breast  . Dilatation & currettage/hysteroscopy with resectocope N/A 07/28/2015    Procedure: [place];  Surgeon: Eldred Manges, MD;  Location: [facility];  Service: Gynecology;  Laterality: N/A;  . Biopsy breast      LEFT  . Cataract surgery    . Carpal tunnel release Bilateral 2009  . Robotic assisted total hysterectomy with bilateral salpingo oopherectomy Bilateral 09/04/2015    Procedure: ROBOTIC ASSISTED HYSTERECTOMY WITH  BILATERAL SALPINGO OOPHORECTOMY SENTINAL NODE MAPPING ;  Surgeon: Everitt Amber, MD;  Location: [facility];  Service: Gynecology;  Laterality: Bilateral;   Family History  Problem Relation Age of Onset  . Heart disease Mother     MI in 53s  . Lymphoma Mother     related to asbestos  . Cancer Mother     lymphoma (asbestos exposure)  . Alcoholism Father   . Cirrhosis Father     due to alcohol  . Asthma Father   . Cancer Maternal Uncle     lung  . Cancer Maternal Uncle     lung   Social History  Substance Use Topics  . Smoking status: Never Smoker   . Smokeless tobacco: None  . Alcohol Use: No   OB History    Gravida Para Term Preterm AB TAB SAB Ectopic Multiple Living   4 3 3       0 4     Review of Systems ROS +'ve left calf swelling  Denies: HEADACHE, NAUSEA, ABDOMINAL PAIN, CHEST PAIN, CONGESTION, DYSURIA, SHORTNESS OF BREATH  Allergies  Influenza vaccines; Budesonide; Prednisone; Flonase; Codeine; Morphine and related; and Nickel  Home Medications   Prior to Admission medications   Medication Sig Start Date End Date Taking? Authorizing Provider  aspirin 81 MG tablet Take 81 mg by mouth daily.   Yes Historical Provider, MD  ferrous sulfate 325 (65 FE) MG EC tablet Take 325 mg by mouth daily with breakfast.    Yes Historical Provider, MD  hydrochlorothiazide (HYDRODIURIL) 25 MG tablet  Take 1 tablet (25 mg total) by mouth daily. 10/27/15  Yes Mariel Aloe, MD  lisinopril (PRINIVIL,ZESTRIL) 5 MG tablet Take 1 tablet (5 mg total) by mouth daily. 04/21/15  Yes Mariel Aloe, MD  Magnesium 250 MG TABS Take 250 mg by mouth every morning.   Yes Historical Provider, MD  meclizine (ANTIVERT) 12.5 MG tablet Take 1 tablet (12.5 mg total) by mouth 3 (three) times daily as needed for dizziness. 10/01/15  Yes Mariel Aloe, MD  ranitidine (ZANTAC) 150 MG tablet Take 1 tablet (150 mg total) by mouth 2 (two) times daily. 10/27/15  Yes Mariel Aloe, MD  Travoprost, BAK Free, (TRAVATAN)  0.004 % SOLN ophthalmic solution Place 1 drop into both eyes at bedtime. 10/23/14  Yes Bernadene Bell, MD  albuterol (PROAIR HFA) 108 (90 BASE) MCG/ACT inhaler Inhale 2 puffs into the lungs every 4 (four) hours as needed. Patient taking differently: Inhale 2 puffs into the lungs every 4 (four) hours as needed for wheezing or shortness of breath.  11/14/12   Cletus Gash, MD  albuterol (PROVENTIL) (2.5 MG/3ML) 0.083% nebulizer solution Take 3 mLs (2.5 mg total) by nebulization every 6 (six) hours as needed for wheezing or shortness of breath. 05/10/15   Nehemiah Settle, NP  calcium carbonate (OS-CAL) 600 MG TABS tablet Take 600 mg by mouth daily with breakfast.    Historical Provider, MD  Cholecalciferol (VITAMIN D-3) 1000 UNITS CAPS Take 3,000 Units by mouth every morning.     Historical Provider, MD  cyclobenzaprine (FLEXERIL) 5 MG tablet Take 5 mg by mouth 3 (three) times daily as needed for muscle spasms.    Historical Provider, MD  cyclopentolate (CYCLODRYL,CYCLOGYL) 1 % ophthalmic solution Place 1 drop into both eyes daily.  07/11/15   Historical Provider, MD  dorzolamide (TRUSOPT) 2 % ophthalmic solution Place 1 drop into both eyes 2 (two) times daily.  07/11/15   Historical Provider, MD  ketorolac (ACULAR) 0.5 % ophthalmic solution Place 1 drop into both eyes 3 (three) times daily.    Historical Provider, MD  Multiple Vitamin (MULTIVITAMIN) tablet Take 1 tablet by mouth every morning.     Historical Provider, MD  Multiple Vitamins-Minerals (HAIR SKIN AND NAILS FORMULA PO) Take 1 tablet by mouth every morning.    Historical Provider, MD  Dulac 20 MG capsule  09/27/15   Historical Provider, MD  ondansetron (ZOFRAN) 4 MG tablet Take 1 tablet (4 mg total) by mouth every 8 (eight) hours as needed for nausea or vomiting. 10/01/15   Mariel Aloe, MD  prednisoLONE acetate (PRED FORTE) 1 % ophthalmic suspension Place 1 drop into both eyes 3 (three) times daily.  07/29/15   Historical Provider, MD    psyllium (METAMUCIL) 58.6 % powder Take 1 packet by mouth 3 (three) times daily.    Historical Provider, MD  traMADol (ULTRAM) 50 MG tablet Take 50 mg by mouth every 6 (six) hours as needed for moderate pain.     Historical Provider, MD   Meds Ordered and Administered this Visit  Medications - No data to display  BP 137/84 mmHg  Pulse 70  Temp(Src) 98.2 F (36.8 C) (Oral)  Resp 16  SpO2 99% No data found.   Physical Exam  Constitutional: She appears well-developed and well-nourished. No distress.  HENT:  Head: Normocephalic and atraumatic.  Musculoskeletal:       Legs: Nursing note and vitals reviewed.   ED Course  Procedures (including critical care time)  Labs Review Labs Reviewed - No data to display  Imaging Review No results found.   Visual Acuity Review  Right Eye Distance:   Left Eye Distance:   Bilateral Distance:    Right Eye Near:   Left Eye Near:    Bilateral Near:         MDM   1. Varicose vein of leg    diagnoses is engorged varicose vein leg.  There is no indication of DVT or phlebitis at this time. Suggest symptomatic treatment which will consist of heat and elevation follow-up with primary care provider. Patient is concerned taking anti-inflammatories as she is taken ketorolac for cataracts. I have advised her that she should only take additional anti-inflammatories if directed by her physician. Instructions in care provider discharged home in stable condition      Konrad Felix, [place] 11/02/15 1424

## 2015-11-12 DIAGNOSIS — H401134 Primary open-angle glaucoma, bilateral, indeterminate stage: Secondary | ICD-10-CM | POA: Diagnosis not present

## 2015-11-17 ENCOUNTER — Other Ambulatory Visit: Payer: Self-pay | Admitting: Family Medicine

## 2015-11-17 MED ORDER — RANITIDINE HCL 150 MG PO TABS: 150.0000 mg | ORAL_TABLET | Freq: Two times a day (BID) | ORAL | Status: DC

## 2015-11-17 NOTE — Telephone Encounter (Signed)
Pt called and needs a refill on her Ranitidine called in to [facility] on [address]. jw

## 2015-11-20 ENCOUNTER — Ambulatory Visit (INDEPENDENT_AMBULATORY_CARE_PROVIDER_SITE_OTHER): Payer: Medicare Other | Admitting: Neurology

## 2015-11-20 ENCOUNTER — Encounter: Payer: Self-pay | Admitting: Neurology

## 2015-11-20 VITALS — BP 142/86 | HR 97 | Ht 66.5 in | Wt 262.0 lb

## 2015-11-20 DIAGNOSIS — E348 Other specified endocrine disorders: Secondary | ICD-10-CM

## 2015-11-20 NOTE — Progress Notes (Signed)
NEUROLOGY CONSULTATION NOTE  Roberta [name] MRN: [id number] DOB: June 01, 1956  Referring provider: Dr. Lonny Prude Primary care provider: Dr. Lonny Prude  Reason for consult:  Pineal gland cyst  HISTORY OF PRESENT ILLNESS: Roberta [name] is a 59 year old right-handed female with hypertension, asthma, depression, GERD, asthma and endometrial cancer who presents for pineal gland cyst.  Labs and MRI brain imaging reviewed..  She has history of BPPV.  She experienced brief spinning sensation with nausea and vomiting with change in position.  For evaluation of vertigo, she underwent MRI of brain with and without contrast on 01/27/15, which revealed small pineal cyst without compression of tectum and partially empty sella.  It also revealed mild exophthalmos.  Vertigo has responded to the Epley maneuver.  She also underwent cataract surgery this past April and May.  She currently has 20/20 vision, however she continues to have flare-up of the eyes.  She denies headache.  TSH 2.804.  PAST MEDICAL HISTORY: Past Medical History  Diagnosis Date  . Anemia   . Asthma   . Depression   . Hyperlipidemia   . Hypertension   . ALLERGIC RHINITIS 10/14/2010  . ASTHMA, INTERMITTENT 02/16/2007  . BACK PAIN, CHRONIC 03/25/2009  . CERVICAL RADICULOPATHY 10/16/2009  . DEPRESSIVE DISORDER NOT ELSEWHERE CLASSIFIED 03/25/2009  . GERD (gastroesophageal reflux disease) 09/09/2011  . Glaucoma     sees optho every 3 months, drops each night  . Postmenopausal vaginal bleeding 09/24/2014  . PONV (postoperative nausea and vomiting)   . Abnormal EKG     nonspecific ST and T-wave changes - Followed by Dr.Berry  . DDD (degenerative disc disease), lumbosacral   . Endometrial cancer ([place])     PAST SURGICAL HISTORY: Past Surgical History  Procedure Laterality Date  . Cervical spine surgery    . Rotator cuff repair Right May 2014  . Cholecystectomy  1980  . Bunionectomy      bilateral and toe correction  . Knee arthroscopy     . Ankle surgery    . Breast cyst incision and drainage      under breast  . Dilatation & currettage/hysteroscopy with resectocope N/A 07/28/2015    Procedure: [place];  Surgeon: Eldred Manges, MD;  Location: [facility];  Service: Gynecology;  Laterality: N/A;  . Biopsy breast      LEFT  . Cataract surgery    . Carpal tunnel release Bilateral 2009  . Robotic assisted total hysterectomy with bilateral salpingo oopherectomy Bilateral 09/04/2015    Procedure: ROBOTIC ASSISTED HYSTERECTOMY WITH BILATERAL SALPINGO OOPHORECTOMY SENTINAL NODE MAPPING ;  Surgeon: Everitt Amber, MD;  Location: [facility];  Service: Gynecology;  Laterality: Bilateral;    MEDICATIONS: Current Outpatient Prescriptions on File Prior to Visit  Medication Sig Dispense Refill  . albuterol (PROAIR HFA) 108 (90 BASE) MCG/ACT inhaler Inhale 2 puffs into the lungs every 4 (four) hours as needed. (Patient taking differently: Inhale 2 puffs into the lungs every 4 (four) hours as needed for wheezing or shortness of breath. ) 2 Inhaler 11  . albuterol (PROVENTIL) (2.5 MG/3ML) 0.083% nebulizer solution Take 3 mLs (2.5 mg total) by nebulization every 6 (six) hours as needed for wheezing or shortness of breath. 75 mL 1  . aspirin 81 MG tablet Take 81 mg by mouth daily.    . calcium carbonate (OS-CAL) 600 MG TABS tablet Take 600 mg by mouth daily with breakfast.    . Cholecalciferol (VITAMIN D-3) 1000 UNITS CAPS Take 3,000 Units by  mouth every morning.     . cyclobenzaprine (FLEXERIL) 5 MG tablet Take 5 mg by mouth 3 (three) times daily as needed for muscle spasms.    . cyclopentolate (CYCLODRYL,CYCLOGYL) 1 % ophthalmic solution Place 1 drop into both eyes daily.     . diclofenac (CATAFLAM) 50 MG tablet Take 1 tablet (50 mg total) by mouth 3 (three) times daily. 20 tablet 0  . dorzolamide (TRUSOPT) 2 % ophthalmic solution Place 1 drop into both eyes 2 (two) times daily.     . ferrous sulfate 325 (65  FE) MG EC tablet Take 325 mg by mouth daily with breakfast.     . hydrochlorothiazide (HYDRODIURIL) 25 MG tablet Take 1 tablet (25 mg total) by mouth daily. 90 tablet 0  . ketorolac (ACULAR) 0.5 % ophthalmic solution Place 1 drop into both eyes 3 (three) times daily.    Marland Kitchen lisinopril (PRINIVIL,ZESTRIL) 5 MG tablet Take 1 tablet (5 mg total) by mouth daily. 90 tablet 3  . Magnesium 250 MG TABS Take 250 mg by mouth every morning.    . meclizine (ANTIVERT) 12.5 MG tablet Take 1 tablet (12.5 mg total) by mouth 3 (three) times daily as needed for dizziness. 30 tablet 1  . Multiple Vitamin (MULTIVITAMIN) tablet Take 1 tablet by mouth every morning.     . Multiple Vitamins-Minerals (HAIR SKIN AND NAILS FORMULA PO) Take 1 tablet by mouth every morning.    . ondansetron (ZOFRAN) 4 MG tablet Take 1 tablet (4 mg total) by mouth every 8 (eight) hours as needed for nausea or vomiting. 20 tablet 0  . prednisoLONE acetate (PRED FORTE) 1 % ophthalmic suspension Place 1 drop into both eyes 3 (three) times daily.     . psyllium (METAMUCIL) 58.6 % powder Take 1 packet by mouth 3 (three) times daily.    . ranitidine (ZANTAC) 150 MG tablet Take 1 tablet (150 mg total) by mouth 2 (two) times daily. 60 tablet 0  . traMADol (ULTRAM) 50 MG tablet Take 50 mg by mouth every 6 (six) hours as needed for moderate pain.     . Travoprost, BAK Free, (TRAVATAN) 0.004 % SOLN ophthalmic solution Place 1 drop into both eyes at bedtime. 2.5 mL 3   No current facility-administered medications on file prior to visit.    ALLERGIES: Allergies  Allergen Reactions  . Influenza Vaccines Anaphylaxis    Throat swelling reported after flu shot in the past (cannot verify with records but would not give)  . Budesonide Other (See Comments)    Throat infection per patient  . Prednisone Other (See Comments)    increased vaginal bleeding, able to tolerate low dose (40 mg)  . Flonase [Fluticasone Propionate] Other (See Comments)    Upper  respitory  issues  . Codeine Nausea And Vomiting  . Morphine And Related Nausea And Vomiting  . Nickel Rash    FAMILY HISTORY: Family History  Problem Relation Age of Onset  . Heart disease Mother     MI in 94s  . Lymphoma Mother     related to asbestos  . Cancer Mother     lymphoma (asbestos exposure)  . Alcoholism Father   . Cirrhosis Father     due to alcohol  . Asthma Father   . Cancer Maternal Uncle     lung  . Cancer Maternal Uncle     lung    SOCIAL HISTORY: Social History   Social History  . Marital Status: Divorced  Spouse Name: N/A  . Number of Children: 4  . Years of Education: 134   Occupational History  . DISABLED   . Previously a CNA    Social History Main Topics  . Smoking status: Never Smoker   . Smokeless tobacco: Not on file  . Alcohol Use: No  . Drug Use: No  . Sexual Activity: No   Other Topics Concern  . 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 Part time student due to learning disability. 4 kids.       On disability.       College education. Lives alone. No stairs.                 REVIEW OF SYSTEMS: Constitutional: No fevers, chills, or sweats, no generalized fatigue, change in appetite Eyes: Eye soreness.  No visual changes, double vision, eye pain Ear, nose and throat: No hearing loss, ear pain, nasal congestion, sore throat Cardiovascular: No chest pain, palpitations Respiratory:  No shortness of breath at rest or with exertion, wheezes GastrointestinaI: No nausea, vomiting, diarrhea, abdominal pain, fecal incontinence Genitourinary:  No dysuria, urinary retention or frequency Musculoskeletal:  Back pain which causes some trouble with walking.  Using cane. Integumentary: No rash, pruritus, skin lesions Neurological: as above Psychiatric: No depression, insomnia, anxiety Endocrine: No  palpitations, fatigue, diaphoresis, mood swings, change in appetite, change in weight, increased thirst Hematologic/Lymphatic:  No anemia, purpura, petechiae. Allergic/Immunologic: no itchy/runny eyes, nasal congestion, recent allergic reactions, rashes  PHYSICAL EXAM: Filed Vitals:   11/20/15 1056  BP: 142/86  Pulse: 97   General: No acute distress.  Patient appears well-groomed. Head:  Normocephalic/atraumatic Eyes:  fundi unremarkable, without vessel changes, exudates, hemorrhages or papilledema. Neck: supple, no paraspinal tenderness, full range of motion Heart: regular rate and rhythm Lungs: Clear to auscultation bilaterally. Vascular: No carotid bruits. Neurological Exam: Mental status: alert and oriented to person, place, and time, recent and remote memory intact, fund of knowledge intact, attention and concentration intact, speech fluent and not dysarthric, language intact. Cranial nerves: CN I: not tested CN II: pupils equal, round and reactive to light, visual fields intact, fundi unremarkable, without vessel changes, exudates, hemorrhages or papilledema. CN III, IV, VI:  full range of motion, no nystagmus, no ptosis CN V: facial sensation intact CN VII: upper and lower face symmetric CN VIII: hearing intact CN IX, X: gag intact, uvula midline CN XI: sternocleidomastoid and trapezius muscles intact CN XII: tongue midline Bulk & Tone: normal, no fasciculations. Motor:  5/5 throughout  Sensation: temperature and vibration sensation intact. Deep Tendon Reflexes:  2+ throughout, toes downgoing. Finger to nose testing:  Without dysmetria.  Heel to shin:  Without dysmetria.  Gait:  Mild antalgic gait.  Able to turn.  Some difficulty with tandem walking.  Romberg negative.  IMPRESSION: Small pineal gland cyst, incidental finding.  She is asymptomatic.  It does not compress the tectum.Marland Kitchen  PLAN: No further testing required.  Thank you for allowing me to take part in the  care of this patient.  Metta Clines, DO  CC:  Cordelia Poche, MD

## 2015-11-20 NOTE — Patient Instructions (Signed)
You have a small cyst but it is nothing to be of concern.  No further testing needed.

## 2015-12-19 ENCOUNTER — Encounter: Payer: Self-pay | Admitting: Family Medicine

## 2015-12-19 ENCOUNTER — Ambulatory Visit (INDEPENDENT_AMBULATORY_CARE_PROVIDER_SITE_OTHER): Payer: Medicare Other | Admitting: Family Medicine

## 2015-12-19 ENCOUNTER — Other Ambulatory Visit: Payer: Self-pay | Admitting: Family Medicine

## 2015-12-19 VITALS — BP 128/83 | HR 86 | Temp 97.8°F | Wt 251.0 lb

## 2015-12-19 DIAGNOSIS — I10 Essential (primary) hypertension: Secondary | ICD-10-CM | POA: Diagnosis not present

## 2015-12-19 DIAGNOSIS — B9681 Helicobacter pylori [H. pylori] as the cause of diseases classified elsewhere: Secondary | ICD-10-CM

## 2015-12-19 DIAGNOSIS — A048 Other specified bacterial intestinal infections: Secondary | ICD-10-CM

## 2015-12-19 DIAGNOSIS — M79605 Pain in left leg: Secondary | ICD-10-CM | POA: Diagnosis not present

## 2015-12-19 DIAGNOSIS — K219 Gastro-esophageal reflux disease without esophagitis: Secondary | ICD-10-CM | POA: Diagnosis not present

## 2015-12-19 LAB — POCT H PYLORI SCREEN: H Pylori Screen, POC: POSITIVE

## 2015-12-19 MED ORDER — AMOXICILLIN 500 MG PO TABS: 1000.0000 mg | ORAL_TABLET | Freq: Two times a day (BID) | ORAL | Status: AC

## 2015-12-19 MED ORDER — ESOMEPRAZOLE MAGNESIUM 40 MG PO PACK: 40.0000 mg | PACK | Freq: Every day | ORAL | Status: DC

## 2015-12-19 MED ORDER — CLARITHROMYCIN 500 MG PO TABS: 500.0000 mg | ORAL_TABLET | Freq: Two times a day (BID) | ORAL | Status: AC

## 2015-12-19 NOTE — Assessment & Plan Note (Signed)
Patient with positive H. Pylori antibodies. Will treat for infection with 14 day regimen of amoxicillin, nexium and clarithromycin. Will need follow-up testing to ensure eradication.

## 2015-12-19 NOTE — Assessment & Plan Note (Signed)
Controlled. No changes. 

## 2015-12-19 NOTE — Progress Notes (Signed)
    Subjective    Roberta [name] is a 59 y.o. female that presents for a follow-up visit for chronic issues.   1. GERD: She is sometimes having symptoms of coughing and reflux at night. Ranitidine is not helping much with symptoms. Nexium worked much better for her. She is not eating within 2 hours of sleeping. She has a history of an EGD and states they saw a gastric polyp.  2. Hypertension: She is adherent with lisinopril 5mg  daily. No chest pain or dyspnea.  3. Leg issue: She has noticed symptoms starting about three months ago. She describes a bump in her leg with two vericose veins around her calf area. She has no associated pain. She is unsure of what the lesion is. Getting up after sitting for long periods sometimes hurts. Improves with elevating legs.  Social History  Substance Use Topics  . Smoking status: Never Smoker   . Smokeless tobacco: None  . Alcohol Use: No    Allergies  Allergen Reactions  . Influenza Vaccines Anaphylaxis    Throat swelling reported after flu shot in the past (cannot verify with records but would not give)  . Budesonide Other (See Comments)    Throat infection per patient  . Prednisone Other (See Comments)    increased vaginal bleeding, able to tolerate low dose (40 mg)  . Flonase [Fluticasone Propionate] Other (See Comments)    Upper respitory  issues  . Codeine Nausea And Vomiting  . Morphine And Related Nausea And Vomiting  . Nickel Rash    No orders of the defined types were placed in this encounter.    ROS  Per HPI   Objective   BP 128/83 mmHg  Pulse 86  Temp(Src) 97.8 F (36.6 C) (Oral)  Wt 251 lb (113.853 kg)  General: Well appearing, no distress Extremities: Tenderness along anteromedial aspect of distal left leg with no erythema, fluctuation or rash. No bony tenderness of tibia  Assessment and Plan    GERD (gastroesophageal reflux disease) Patient with positive H. Pylori antibodies. Will treat for infection with 14 day  regimen of amoxicillin, nexium and clarithromycin. Will need follow-up testing to ensure eradication.   HYPERTENSION, BENIGN SYSTEMIC Controlled. No changes  Pain of left lower extremity Possible lipoma, but very non-specific exam. Differential includes shin splints, stress fracture, varicose veins. Will watch for now. Can try compression stockings since has a history of varicose veins.

## 2015-12-19 NOTE — Assessment & Plan Note (Signed)
Possible lipoma, but very non-specific exam. Differential includes shin splints, stress fracture, varicose veins. Will watch for now. Can try compression stockings since has a history of varicose veins.

## 2015-12-19 NOTE — Patient Instructions (Addendum)
Thank you for coming to see me today. It was a pleasure. Today we talked about:   Hypertension: your blood pressure is controlled. No changes  GERD: I am getting a test today. You can restart your nexium  Leg pain: we will watch this for now. If pain is worse with activity, I may get an x-ray of your leg  Please make an appointment to see me in 3 months for follow-up of hypertension, or sooner for follow-up of your leg.  If you have any questions or concerns, please do not hesitate to call the office at [phone].  Sincerely,  Cordelia Poche, MD

## 2015-12-20 ENCOUNTER — Encounter: Payer: Self-pay | Admitting: Family Medicine

## 2015-12-22 ENCOUNTER — Encounter: Payer: Self-pay | Admitting: Family Medicine

## 2016-01-05 ENCOUNTER — Ambulatory Visit (INDEPENDENT_AMBULATORY_CARE_PROVIDER_SITE_OTHER): Payer: Medicare Other | Admitting: Family Medicine

## 2016-01-05 ENCOUNTER — Encounter: Payer: Self-pay | Admitting: Family Medicine

## 2016-01-05 VITALS — BP 122/74 | HR 87 | Temp 97.9°F | Ht 66.5 in | Wt 253.4 lb

## 2016-01-05 DIAGNOSIS — K219 Gastro-esophageal reflux disease without esophagitis: Secondary | ICD-10-CM

## 2016-01-05 NOTE — Progress Notes (Signed)
    Subjective    Roberta [name] is a 60 y.o. female that presents for a follow-up visit for chronic issues.   1. GERD: Patient has completed course of amoxicillin, high dose Nexium and clarithromycin. She reports not taking Nexium for the last three days (01/02/2016) with no symptoms of reflux. No chest pain or dyspnea. She has not resumed a diet that could possibly aggravate her symptoms.  Social History  Substance Use Topics  . Smoking status: Never Smoker   . Smokeless tobacco: None  . Alcohol Use: No    Allergies  Allergen Reactions  . Influenza Vaccines Anaphylaxis    Throat swelling reported after flu shot in the past (cannot verify with records but would not give)  . Budesonide Other (See Comments)    Throat infection per patient  . Prednisone Other (See Comments)    increased vaginal bleeding, able to tolerate low dose (40 mg)  . Flonase [Fluticasone Propionate] Other (See Comments)    Upper respitory  issues  . Codeine Nausea And Vomiting  . Morphine And Related Nausea And Vomiting  . Nickel Rash    No orders of the defined types were placed in this encounter.    ROS  Per HPI   Objective   BP 122/74 mmHg  Pulse 87  Temp(Src) 97.9 F (36.6 C) (Oral)  Ht 5' 6.5" (1.689 m)  Wt 253 lb 6.4 oz (114.941 kg)  BMI 40.29 kg/m2  General: Well appearing, no distress Respiratory/Chest: Clear to auscultation bilaterally. Unlabored work of breathing. No wheezing or rales. Cardiovascular: Regular rate and rhythm. Normal S1 and S2. No heart murmurs present. No extra heart sounds  Assessment and Plan    GERD (gastroesophageal reflux disease) Possibly cured. Advised to discontinue Nexium for now. Follow-up in a minimum of 4 weeks while off of PPI to test for eradication.

## 2016-01-05 NOTE — Assessment & Plan Note (Signed)
Possibly cured. Advised to discontinue Nexium for now. Follow-up in a minimum of 4 weeks while off of PPI to test for eradication.

## 2016-01-05 NOTE — Patient Instructions (Signed)
Thank you for coming to see me today. It was a pleasure. Today we talked about:   GERD: I'm so happy that your symptoms have improved. We can do a confirmatory test in at minimum 4 weeks after you last took the Nexium  If you have any questions or concerns, please do not hesitate to call the office at [phone].  Sincerely,  Cordelia Poche, MD

## 2016-01-07 ENCOUNTER — Ambulatory Visit: Payer: Medicare Other | Attending: Family Medicine | Admitting: Physical Therapy

## 2016-01-07 DIAGNOSIS — H8110 Benign paroxysmal vertigo, unspecified ear: Secondary | ICD-10-CM | POA: Insufficient documentation

## 2016-01-08 ENCOUNTER — Ambulatory Visit: Payer: Medicare Other | Admitting: Physical Therapy

## 2016-01-09 ENCOUNTER — Encounter: Payer: Self-pay | Admitting: Physical Therapy

## 2016-01-09 ENCOUNTER — Other Ambulatory Visit: Payer: Self-pay | Admitting: Family Medicine

## 2016-01-09 NOTE — Therapy (Signed)
[facility] [address] [place], [place], [address] Phone: [phone]   Fax:  [phone]  Physical Therapy Treatment  Patient Details  Name: Roberta [name] MRN: [id number] Date of Birth: 1956-04-23 Referring Provider: Dr. Cordelia Poche  Encounter Date: 01/07/2016      PT End of Session - 01/09/16 1157    Visit Number 1   Number of Visits 4   Date for PT Re-Evaluation 02/07/16   Authorization Type [employer]   PT Start Time 1615   PT Stop Time 1700   PT Time Calculation (min) 45 min      Past Medical History  Diagnosis Date  . Anemia   . Asthma   . Depression   . Hyperlipidemia   . Hypertension   . ALLERGIC RHINITIS 10/14/2010  . ASTHMA, INTERMITTENT 02/16/2007  . BACK PAIN, CHRONIC 03/25/2009  . CERVICAL RADICULOPATHY 10/16/2009  . DEPRESSIVE DISORDER NOT ELSEWHERE CLASSIFIED 03/25/2009  . GERD (gastroesophageal reflux disease) 09/09/2011  . Glaucoma     sees optho every 3 months, drops each night  . Postmenopausal vaginal bleeding 09/24/2014  . PONV (postoperative nausea and vomiting)   . Abnormal EKG     nonspecific ST and T-wave changes - Followed by Dr.Berry  . DDD (degenerative disc disease), lumbosacral   . Endometrial cancer [facility])     Past Surgical History  Procedure Laterality Date  . Cervical spine surgery    . Rotator cuff repair Right May 2014  . Cholecystectomy  1980  . Bunionectomy      bilateral and toe correction  . Knee arthroscopy    . Ankle surgery    . Breast cyst incision and drainage      under breast  . Dilatation & currettage/hysteroscopy with resectocope N/A 07/28/2015    Procedure: [place];  Surgeon: Eldred Manges, MD;  Location: [facility];  Service: Gynecology;  Laterality: N/A;  . Biopsy breast      LEFT  . Cataract surgery    . Carpal tunnel release Bilateral 2009  . Robotic assisted total hysterectomy with bilateral  salpingo oopherectomy Bilateral 09/04/2015    Procedure: ROBOTIC ASSISTED HYSTERECTOMY WITH BILATERAL SALPINGO OOPHORECTOMY SENTINAL NODE MAPPING ;  Surgeon: Everitt Amber, MD;  Location: [facility];  Service: Gynecology;  Laterality: Bilateral;    There were no vitals filed for this visit.  Visit Diagnosis:  BPPV (benign paroxysmal positional vertigo), unspecified laterality - Plan: PT plan of care cert/re-cert      Subjective Assessment - 01/09/16 1153    Subjective Pt reports she woke up this am (01-07-16) with severe vertigo - took Meclizine but is still nauseated - got very dizzy when she rolled over onto R side   Pertinent History pineal gland cyst - awaiting neurology referral; glaucoma; HTN;   Patient Stated Goals resolve the vertigo      Pt had L horizontal nystagmus in R Dix-Hallpike position;  L horizontal nystagmus with R horizontal roll test - indicative of  L horizontal cupulolithiasis BPPV  Canalith Repositioning - performed [place] roll for L horizontal canal BPPV x 2 reps; then performed Appiani maneuver  for L horizontal cupulolithiasis x 1 rep  Instructed pt to do repetitive rolling at home for HEP for L BPPV horizontal canal                       PT Education - 01/09/16 1157    Education provided Yes  Education Details rolling for habituation   Person(s) Educated Patient   Methods Explanation;Demonstration   Comprehension Verbalized understanding;Returned demonstration          PT Short Term Goals - 01/09/16 1202    PT SHORT TERM GOAL #1   Title Pt. will have a negative R roll test with no nystagmus and no c/o vertigo to indicate that R BPPV has resolved.  (02-07-16)   Time 4   Period Weeks   Status New   PT SHORT TERM GOAL #2   Title Pt will report no vertigo with any bed mobility.  (02-07-16)   Time 4   Period Weeks   Status New   PT SHORT TERM GOAL #3   Title Independent in HEP for habituation prn.  (02-07-16)   Time 4   Period Weeks    Status New           PT Long Term Goals - 10/07/15 [id number]    PT LONG TERM GOAL #1   Title same as STG's as ELOS = 4 weeks               Plan - 01/09/16 1158    Clinical Impression Statement Pt has L horizontal nystagmus with head turned toward R side - consistent with L horizontal cupulolithiasis -- symptoms improved after completion of Appiani maneuver   Pt will benefit from skilled therapeutic intervention in order to improve on the following deficits Dizziness;Difficulty walking;Decreased balance   Rehab Potential Good   PT Frequency 1x / week   PT Duration 4 weeks   PT Treatment/Interventions Vestibular;Canalith Repostioning;ADLs/Self Care Home Management;Neuromuscular re-education;Patient/family education   PT Next Visit Plan recheck L horizontal cupulolithiasis BPPV   PT Home Exercise Plan repetitive rolling        Problem List Patient Active Problem List   Diagnosis Date Noted  . Pain of left lower extremity 12/19/2015  . Pineal gland cyst 10/01/2015  . Exophthalmos 10/01/2015  . Endometrial cancer ([place]) 08/11/2015  . Postmenopausal bleeding 07/28/2015  . Atypical endometrial hyperplasia 07/28/2015  . Abnormal EKG 07/21/2015  . Benign paroxysmal positional vertigo 09/24/2014  . Routine health maintenance 04/24/2014  . Uterine fibroid 01/04/2014  . Cervical disc disorder with radiculopathy of cervical region 10/24/2013  . Glaucoma   . GERD (gastroesophageal reflux disease) 09/09/2011  . Gastric polyp 09/09/2011  . ALLERGIC RHINITIS 10/14/2010  . BREAST MASS, BENIGN 02/04/2010  . OTHER POSTSURGICAL STATUS OTHER 10/16/2009  . Depression 03/25/2009  . BACK PAIN, CHRONIC 03/25/2009  . HYPERLIPIDEMIA 02/16/2007  . OBESITY, NOS 02/16/2007  . ANEMIA, OTHER, UNSPECIFIED 02/16/2007  . HYPERTENSION, BENIGN SYSTEMIC 02/16/2007  . Asthma 02/16/2007    Alda Lea, PT 01/09/2016, 12:12 PM  [facility] [address] [place], [place], [address] Phone: [phone]   Fax:  [phone]  Name: Roberta [name] MRN: [id number] Date of Birth: [birth year]

## 2016-01-09 NOTE — Patient Instructions (Signed)
Rolling (Active)    Lie on back, legs straight. Draw left leg up and roll to other side. Complete __5_ sets of __5_ repetitions. Perform __3_ sessions per day.  Copyright  VHI. All rights reserved.

## 2016-01-12 ENCOUNTER — Encounter: Payer: Self-pay | Admitting: Family Medicine

## 2016-01-12 ENCOUNTER — Telehealth: Payer: Self-pay | Admitting: Family Medicine

## 2016-01-12 NOTE — Telephone Encounter (Signed)
Is it ok to take Tums while off her acid reflux meds?

## 2016-01-12 NOTE — Telephone Encounter (Signed)
This is not a chronic medication

## 2016-01-13 MED ORDER — ONDANSETRON HCL 4 MG PO TABS: 4.0000 mg | ORAL_TABLET | Freq: Three times a day (TID) | ORAL | Status: DC | PRN

## 2016-01-13 NOTE — Telephone Encounter (Signed)
Please inform patient that it is okay for her to take Tums.

## 2016-01-13 NOTE — Telephone Encounter (Signed)
PT informed of below. Zimmerman Rumple, April D, CMA  

## 2016-01-31 DIAGNOSIS — M7061 Trochanteric bursitis, right hip: Secondary | ICD-10-CM | POA: Diagnosis not present

## 2016-02-10 ENCOUNTER — Encounter: Payer: Self-pay | Admitting: Family Medicine

## 2016-02-10 ENCOUNTER — Ambulatory Visit (INDEPENDENT_AMBULATORY_CARE_PROVIDER_SITE_OTHER): Payer: Medicare Other | Admitting: Family Medicine

## 2016-02-10 VITALS — BP 118/74 | HR 78 | Temp 98.5°F | Wt 253.7 lb

## 2016-02-10 DIAGNOSIS — K219 Gastro-esophageal reflux disease without esophagitis: Secondary | ICD-10-CM

## 2016-02-10 NOTE — Progress Notes (Deleted)
Patient ID: Roberta [name], female   DOB: November 19, 1956, 60 y.o.   MRN: [id number]   Subjective:  This history was provided by the patient.  Roberta [name] is a 60 y.o. female who  has a past medical history of GERD and H Pylori.  Patient presents to clinic for follow-up regarding GERD and H Pylori treatment.  On 01/05/16, patient stopped taking Nexium and was supposed to return to clinic for test of cure for H Pylori.  Since she stopped taking the Nexium, patient has had some intermittent indigestion and burning epigastric pain that is worse with certain foods (tomato sauce, chocolate).  Patient denies N/V/D, fever, changes in stool and abdominal pain.  Patient is using Tums intermittently for indigestion with some relief.    Review of Systems:  Per HPI. All other systems reviewed and are negative.   PMH, PSH, Medications, Allergies, and FmHx reviewed and updated in EMR.  Social History: never smoker  Objective:  BP 118/74 mmHg  Pulse 78  Temp(Src) 98.5 F (36.9 C) (Oral)  Wt 253 lb 11.2 oz (115.078 kg)  General: 60 y.o. female Awake, alert, obese, no apparent distress and non-toxic in appearance Cardio: RRR, S1S2 heard, no murmurs appreciated, no lower extremity edema; +2 radial pulses bilaterally Pulm: Clear to auscultation bilaterally, no wheezes, rhonchi or rales GI: Soft, not tender, no distension,+BS x4, no hepatomegaly, no splenomegaly Neuro: Strength and sensation grossly intact  Assessment & Plan:  Roberta [name] is a 60 y.o. female here for follow-up regarding H Pylori.  She had a positive H. Pylori breath test in past and has undergone treatment with antibiotics and PPI.  She has been finished with those meds since January of this year and presents to clinic for test of cure.   H. Pylori breath test completed in clinic today.  If those results come back negative, patient is requesting referral to GI to further determine cause of her ongoing discomfort when not taking a PPI.   Patient seems to understand her trigger foods for GERD exacerbation, but expresses frustration at having to limit her diet.  Explained to patient that this may be necessary to avoid symptoms, but she would like GI consult if H. Pylori test is negative.  1. Gastroesophageal reflux disease, esophagitis presence not specified - H. pylori breath test     Sherron Ales, NP [facility] 02/10/2016 3:37 PM

## 2016-02-10 NOTE — Progress Notes (Signed)
    Subjective    Roberta [name] is a 60 y.o. female that presents for a follow-up visit for:   1. GERD: Patient reports intermittent acid reflux since stopping her PPI. She is having symptoms daily, however symptoms are less painful. No nausea or vomiting. She takes Tums and peppermint which helps. She has cut down on tomato sauce, spicy foods, chocolate, cookies.  Social History  Substance Use Topics  . Smoking status: Never Smoker   . Smokeless tobacco: None  . Alcohol Use: No    Allergies  Allergen Reactions  . Influenza Vaccines Anaphylaxis    Throat swelling reported after flu shot in the past (cannot verify with records but would not give)  . Budesonide Other (See Comments)    Throat infection per patient  . Prednisone Other (See Comments)    increased vaginal bleeding, able to tolerate low dose (40 mg)  . Flonase [Fluticasone Propionate] Other (See Comments)    Upper respitory  issues  . Codeine Nausea And Vomiting  . Morphine And Related Nausea And Vomiting  . Nickel Rash    No orders of the defined types were placed in this encounter.    ROS  Per HPI   Objective   BP 118/74 mmHg  Pulse 78  Temp(Src) 98.5 F (36.9 C) (Oral)  Wt 253 lb 11.2 oz (115.078 kg)  Vital signs reviewed  General: Well appearing, no distress  Assessment and Plan    1. Gastroesophageal reflux disease, esophagitis presence not specified - H. pylori breath test - if negative will refer to GI. If positive, will treat with another antibiotic regimen

## 2016-02-10 NOTE — Patient Instructions (Signed)
Thank you for coming to see me today. It was a pleasure. Today we talked about:   GERD (reflux): I am getting a breath test today. If positive, I will retreat you. If negative, I will send you to the gastroenterologist.  Please make an appointment to see me in three months.  If you have any questions or concerns, please do not hesitate to call the office at [phone].  Sincerely,  Cordelia Poche, MD

## 2016-02-11 LAB — H. PYLORI BREATH TEST: H. pylori Breath Test: NOT DETECTED

## 2016-02-13 ENCOUNTER — Other Ambulatory Visit: Payer: Self-pay | Admitting: Family Medicine

## 2016-02-13 DIAGNOSIS — K317 Polyp of stomach and duodenum: Secondary | ICD-10-CM

## 2016-02-13 DIAGNOSIS — K219 Gastro-esophageal reflux disease without esophagitis: Secondary | ICD-10-CM

## 2016-02-20 DIAGNOSIS — R12 Heartburn: Secondary | ICD-10-CM | POA: Diagnosis not present

## 2016-02-20 DIAGNOSIS — K219 Gastro-esophageal reflux disease without esophagitis: Secondary | ICD-10-CM | POA: Diagnosis not present

## 2016-02-21 DIAGNOSIS — M25561 Pain in right knee: Secondary | ICD-10-CM | POA: Diagnosis not present

## 2016-02-27 ENCOUNTER — Other Ambulatory Visit: Payer: Self-pay | Admitting: Gastroenterology

## 2016-02-27 DIAGNOSIS — R12 Heartburn: Secondary | ICD-10-CM | POA: Diagnosis not present

## 2016-02-27 DIAGNOSIS — K219 Gastro-esophageal reflux disease without esophagitis: Secondary | ICD-10-CM | POA: Diagnosis not present

## 2016-02-27 DIAGNOSIS — K1329 Other disturbances of oral epithelium, including tongue: Secondary | ICD-10-CM | POA: Diagnosis not present

## 2016-02-27 DIAGNOSIS — K228 Other specified diseases of esophagus: Secondary | ICD-10-CM | POA: Diagnosis not present

## 2016-02-27 DIAGNOSIS — K3189 Other diseases of stomach and duodenum: Secondary | ICD-10-CM | POA: Diagnosis not present

## 2016-02-27 DIAGNOSIS — L859 Epidermal thickening, unspecified: Secondary | ICD-10-CM | POA: Diagnosis not present

## 2016-03-09 ENCOUNTER — Encounter: Payer: Self-pay | Admitting: Physical Therapy

## 2016-03-09 NOTE — Therapy (Signed)
[facility] [address] [place], [place], [address] Phone: [phone]   Fax:  [phone]  Patient Details  Name: Roberta [name] MRN: [id number] Date of Birth: [birth year] Referring Provider:  No ref. provider found  Encounter Date: 03/09/2016  Pt was last seen on 01-07-16 for BPPV (horizontal canal); pt has not called to schedule a follow up appt as of 03-09-16.  Pt is discharged at this time due to not returning since last visit on 01-07-16 - assuming no additional PT needed.  Alda Lea, PT 03/09/2016, 9:49 AM  [facility] [address] [place], [place], [address] Phone: [phone]   Fax:  [phone]

## 2016-03-16 DIAGNOSIS — M79672 Pain in left foot: Secondary | ICD-10-CM | POA: Diagnosis not present

## 2016-03-29 ENCOUNTER — Encounter: Payer: Self-pay | Admitting: Gynecologic Oncology

## 2016-03-29 ENCOUNTER — Ambulatory Visit: Payer: Medicare Other | Attending: Gynecologic Oncology | Admitting: Gynecologic Oncology

## 2016-03-29 VITALS — BP 127/78 | HR 95 | Temp 98.1°F | Resp 18 | Ht 66.5 in | Wt 253.0 lb

## 2016-03-29 DIAGNOSIS — Z90710 Acquired absence of both cervix and uterus: Secondary | ICD-10-CM | POA: Diagnosis not present

## 2016-03-29 DIAGNOSIS — Z7982 Long term (current) use of aspirin: Secondary | ICD-10-CM | POA: Insufficient documentation

## 2016-03-29 DIAGNOSIS — C541 Malignant neoplasm of endometrium: Secondary | ICD-10-CM | POA: Diagnosis not present

## 2016-03-29 DIAGNOSIS — Z79899 Other long term (current) drug therapy: Secondary | ICD-10-CM | POA: Diagnosis not present

## 2016-03-29 DIAGNOSIS — J45909 Unspecified asthma, uncomplicated: Secondary | ICD-10-CM | POA: Insufficient documentation

## 2016-03-29 DIAGNOSIS — F329 Major depressive disorder, single episode, unspecified: Secondary | ICD-10-CM | POA: Insufficient documentation

## 2016-03-29 DIAGNOSIS — K219 Gastro-esophageal reflux disease without esophagitis: Secondary | ICD-10-CM | POA: Diagnosis not present

## 2016-03-29 DIAGNOSIS — I10 Essential (primary) hypertension: Secondary | ICD-10-CM | POA: Insufficient documentation

## 2016-03-29 DIAGNOSIS — E785 Hyperlipidemia, unspecified: Secondary | ICD-10-CM | POA: Insufficient documentation

## 2016-03-29 DIAGNOSIS — Z6841 Body Mass Index (BMI) 40.0 and over, adult: Secondary | ICD-10-CM | POA: Diagnosis not present

## 2016-03-29 DIAGNOSIS — Z801 Family history of malignant neoplasm of trachea, bronchus and lung: Secondary | ICD-10-CM | POA: Insufficient documentation

## 2016-03-29 NOTE — Patient Instructions (Signed)
Plan to follow up with Dr. Leo Grosser in six months (Oct 2017) and Dr. Denman George in one year.  Please call [phone] to sign up to go to a Bariatric Surgery Information session.  Please call our office at [phone] after you see Dr. Leo Grosser to schedule your appointment.

## 2016-03-29 NOTE — Progress Notes (Signed)
POSTOPERATIVE FOLLOWUP: endometrial cancer  Assessment:    60 y.o. year old with Stage IA Grade 1 endometrioid endometrial cancer.   S/p robotic assisted total hysterectomy, BSO and bilateral SLN biopsy on 09/04/15. no LVSI, 5% myometrial invasion, neg pelvic washings and negative lymph nodes. Morbid obesity (BMI 40kg/m2)   Plan: 1) endometrial cancer: no evidence for recurrence on today's exam. Counseled regarding symptoms consistent with recurrence. 2) obesity: counseled patient about the association between obesity and endometrial cancer and poor prognosis. Patient interested in bariatric surgery. Provided her with information about the program at [facility]. 3)  Return to clinic in 6 months with Dr Leo Grosser and then 12 months to see me.  HPI:  Roberta [name] is a 60 y.o. year old [id number] initially seen in consultation on 08/11/15 for endometrial cancer.  She then underwent a robotic hysterectomy, BSO and bilateral sentinel node biopsy on [id number] without complications.  Her postoperative course was uncomplicated.  Her final pathologic diagnosis is a Stage IA Grade 1 endometrioid endometrial cancer with no lymphovascular space invasion, 1/17 mm (5%) of myometrial invasion and negative lymph nodes (SLN's were removed from bilateral basins, however, no nodal tissue was removed from the left SLN specimen, therefore nodal evaluation was only complete on the right).  Interval Hx: She is seen today for a routine cancer surveillance visit.  Since surgery she is feeling well.  Her weight is stable and she has difficulty losing weight. She has completed cataract surgery and now has the best sight she has ever had. She has no other complaints today.  Current Outpatient Prescriptions on File Prior to Visit  Medication Sig Dispense Refill  . aspirin 81 MG tablet Take 81 mg by mouth daily.    . cyclobenzaprine (FLEXERIL) 5 MG tablet Take 5 mg by mouth 3 (three) times daily as needed for muscle spasms.    .  hydrochlorothiazide (HYDRODIURIL) 25 MG tablet Take 1 tablet (25 mg total) by mouth daily. 90 tablet 0  . lisinopril (PRINIVIL,ZESTRIL) 5 MG tablet Take 1 tablet (5 mg total) by mouth daily. 90 tablet 3  . Multiple Vitamins-Minerals (HAIR SKIN AND NAILS FORMULA PO) Take 1 tablet by mouth every morning.    . Travoprost, BAK Free, (TRAVATAN) 0.004 % SOLN ophthalmic solution Place 1 drop into both eyes at bedtime. 2.5 mL 3  . albuterol (PROAIR HFA) 108 (90 BASE) MCG/ACT inhaler Inhale 2 puffs into the lungs every 4 (four) hours as needed. (Patient not taking: Reported on 03/29/2016) 2 Inhaler 11  . albuterol (PROVENTIL) (2.5 MG/3ML) 0.083% nebulizer solution Take 3 mLs (2.5 mg total) by nebulization every 6 (six) hours as needed for wheezing or shortness of breath. (Patient not taking: Reported on 03/29/2016) 75 mL 1   No current facility-administered medications on file prior to visit.   Allergies  Allergen Reactions  . Influenza Vaccines Anaphylaxis    Throat swelling reported after flu shot in the past (cannot verify with records but would not give)  . Budesonide Other (See Comments)    Throat infection per patient  . Prednisone Other (See Comments)    increased vaginal bleeding, able to tolerate low dose (40 mg)  . Flonase [Fluticasone Propionate] Other (See Comments)    Upper respitory  issues  . Codeine Nausea And Vomiting  . Morphine And Related Nausea And Vomiting  . Nickel Rash   Past Medical History  Diagnosis Date  . Anemia   . Asthma   . Depression   . Hyperlipidemia   .  Hypertension   . ALLERGIC RHINITIS 10/14/2010  . ASTHMA, INTERMITTENT 02/16/2007  . BACK PAIN, CHRONIC 03/25/2009  . CERVICAL RADICULOPATHY 10/16/2009  . DEPRESSIVE DISORDER NOT ELSEWHERE CLASSIFIED 03/25/2009  . GERD (gastroesophageal reflux disease) 09/09/2011  . Glaucoma     sees optho every 3 months, drops each night  . Postmenopausal vaginal bleeding 09/24/2014  . PONV (postoperative nausea and vomiting)    . Abnormal EKG     nonspecific ST and T-wave changes - Followed by Dr.Berry  . DDD (degenerative disc disease), lumbosacral   . Endometrial cancer [facility])    Past Surgical History  Procedure Laterality Date  . Cervical spine surgery    . Rotator cuff repair Right May 2014  . Cholecystectomy  1980  . Bunionectomy      bilateral and toe correction  . Knee arthroscopy    . Ankle surgery    . Breast cyst incision and drainage      under breast  . Dilatation & currettage/hysteroscopy with resectocope N/A 07/28/2015    Procedure: [place];  Surgeon: Eldred Manges, MD;  Location: [facility];  Service: Gynecology;  Laterality: N/A;  . Biopsy breast      LEFT  . Cataract surgery    . Carpal tunnel release Bilateral 2009  . Robotic assisted total hysterectomy with bilateral salpingo oopherectomy Bilateral 09/04/2015    Procedure: ROBOTIC ASSISTED HYSTERECTOMY WITH BILATERAL SALPINGO OOPHORECTOMY SENTINAL NODE MAPPING ;  Surgeon: Everitt Amber, MD;  Location: [facility];  Service: Gynecology;  Laterality: Bilateral;   Family History  Problem Relation Age of Onset  . Heart disease Mother     MI in 59s  . Lymphoma Mother     related to asbestos  . Cancer Mother     lymphoma (asbestos exposure)  . Alcoholism Father   . Cirrhosis Father     due to alcohol  . Asthma Father   . Cancer Maternal Uncle     lung  . Cancer Maternal Uncle     lung   Social History   Social History  . Marital Status: Divorced    Spouse Name: N/A  . Number of Children: 4  . Years of Education: 134   Occupational History  . DISABLED   . Previously a CNA    Social History Main Topics  . Smoking status: Never Smoker   . Smokeless tobacco: Not on file  . Alcohol Use: No  . Drug Use: No  . Sexual Activity: No   Other Topics Concern  . 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 Part time student due to learning disability. 4 kids.       On disability.       College education. Lives alone. No stairs.                  Review of systems: Constitutional:  She has no weight gain or weight loss. She has no fever or chills. Eyes: No blurred vision Ears, Nose, Mouth, Throat: No dizziness, headaches or changes in hearing. No mouth sores. Cardiovascular: No chest pain, palpitations or edema. Respiratory:  No shortness of breath, wheezing or cough Gastrointestinal: She has normal bowel movements without diarrhea or constipation. She denies any nausea or vomiting. She denies blood in her stool or heart burn. Genitourinary:  She denies pelvic pain, pelvic pressure  or changes in her urinary function. She has no hematuria, dysuria, or incontinence. She has no irregular vaginal bleeding or vaginal discharge Musculoskeletal: Denies muscle weakness or joint pains.  Skin:  She has no skin changes, rashes or itching Neurological:  Denies dizziness or headaches. No neuropathy, no numbness or tingling. Psychiatric:  She denies depression or anxiety. Hematologic/Lymphatic:   No easy bruising or bleeding   Physical Exam: Blood pressure 127/78, pulse 95, temperature 98.1 F (36.7 C), temperature source Oral, resp. rate 18, height 5' 6.5" (1.689 m), weight 253 lb (114.76 kg), SpO2 98 %. General: Well dressed, well nourished in no apparent distress.   HEENT:  Normocephalic and atraumatic, no lesions.  Extraocular muscles intact. Sclerae anicteric. Pupils equal, round, reactive. No mouth sores or ulcers. Thyroid is normal size, not nodular, midline. Skin:  No lesions or rashes. Abdomen:  Soft, nontender, nondistended.  No palpable masses.  No hepatosplenomegaly.  No ascites. Normal bowel sounds.  No hernias.  Incisions are well healed. Genitourinary: Normal EGBUS  Vaginal cuff intact.  No bleeding or discharge.  No cul de sac fullness. Extremities:  No cyanosis, clubbing or edema.  No calf tenderness or erythema. No palpable cords. Psychiatric: Mood and affect are appropriate. Neurological: Awake, alert and oriented x 3. Sensation is intact, no neuropathy.  Musculoskeletal: No pain, normal strength and range of motion.  [name], MD

## 2016-04-09 ENCOUNTER — Ambulatory Visit: Payer: Medicare Other | Admitting: Gynecologic Oncology

## 2016-04-12 DIAGNOSIS — Z961 Presence of intraocular lens: Secondary | ICD-10-CM | POA: Diagnosis not present

## 2016-04-12 DIAGNOSIS — H401130 Primary open-angle glaucoma, bilateral, stage unspecified: Secondary | ICD-10-CM | POA: Diagnosis not present

## 2016-04-12 DIAGNOSIS — H43811 Vitreous degeneration, right eye: Secondary | ICD-10-CM | POA: Diagnosis not present

## 2016-04-22 ENCOUNTER — Encounter: Payer: Self-pay | Admitting: Family Medicine

## 2016-04-22 ENCOUNTER — Ambulatory Visit (INDEPENDENT_AMBULATORY_CARE_PROVIDER_SITE_OTHER): Payer: Medicare Other | Admitting: Family Medicine

## 2016-04-22 VITALS — BP 140/77 | HR 90 | Temp 98.7°F | Ht 66.0 in | Wt 251.0 lb

## 2016-04-22 DIAGNOSIS — J029 Acute pharyngitis, unspecified: Secondary | ICD-10-CM

## 2016-04-22 DIAGNOSIS — J4520 Mild intermittent asthma, uncomplicated: Secondary | ICD-10-CM | POA: Diagnosis not present

## 2016-04-22 DIAGNOSIS — R05 Cough: Secondary | ICD-10-CM | POA: Diagnosis not present

## 2016-04-22 DIAGNOSIS — R059 Cough, unspecified: Secondary | ICD-10-CM

## 2016-04-22 MED ORDER — GUAIFENESIN-DM 100-10 MG/5ML PO SYRP: 10.0000 mL | ORAL_SOLUTION | Freq: Four times a day (QID) | ORAL | Status: DC | PRN

## 2016-04-22 MED ORDER — ALBUTEROL SULFATE HFA 108 (90 BASE) MCG/ACT IN AERS: 2.0000 | INHALATION_SPRAY | RESPIRATORY_TRACT | Status: DC | PRN

## 2016-04-22 NOTE — Patient Instructions (Signed)
Your symptoms are due to a viral illness. Antibiotics will not help improve your symptoms, but the following will help you feel better while your body fights the virus.   Drink lots of water (Guaifenesin "Mucinex")  Nasal Saline Spray as needed for congestion  Pain/Sore throat: Tylenol, Ibuprofen  Cough: Dextromethorphan   Wash your hands often to prevent spreading the virus  I also recommend using your albuterol with spacer 2-4 puffs every 6 hours as needed especially at night for the next 2-3 days as needed for cough and wheezing

## 2016-04-22 NOTE — Progress Notes (Signed)
   Subjective:    Patient ID: Roberta [name], female    DOB: 1956/02/22, 60 y.o.   MRN: [id number]  Seen for Same day visit for   CC: sore throat  SORE THROAT  Sore throat began 1 days ago. Pain is: burning Severity: moderate Medications tried: none Strep throat exposure: no STD exposure: no  Symptoms Fever: no Cough: yes Runny nose: yes Muscle aches: no Swollen Glands: no Trouble breathing: yes; reports wheezing last night and history of asthma Drooling: no Weight loss: no  Patient believes could be caused by: virus, grandson had similar symptoms  Review of Symptoms - see HPI PMH - Smoking status noted.    Objective:  BP 140/77 mmHg  Pulse 90  Temp(Src) 98.7 F (37.1 C) (Oral)  Ht 5\' 6"  (1.676 m)  Wt 251 lb (113.853 kg)  BMI 40.53 kg/m2  SpO2 93%  General: NAD HEENT: Pharyngeal erythema with cobblestoning; no cervical adenopathy Cardiac: RRR, normal heart sounds, no murmurs.  Respiratory: CTAB, normal effort Extremities: no edema or cyanosis. WWP. Skin: warm and dry, no rashes noted    Assessment & Plan:   1. Sore throat Most likely viral due to recent sick contact with grandson.  - Will treat symptomatically  2. Asthma, mild intermittent, uncomplicated - albuterol (PROAIR HFA) 108 (90 Base) MCG/ACT inhaler; Inhale 2 puffs into the lungs every 4 (four) hours as needed.  Dispense: 2 Inhaler; Refill: 11  3. Cough - guaiFENesin-dextromethorphan (ROBITUSSIN DM) 100-10 MG/5ML syrup; Take 10 mLs by mouth every 6 (six) hours as needed for cough.  Dispense: 118 mL; Refill: 0

## 2016-04-23 ENCOUNTER — Encounter: Payer: Self-pay | Admitting: Family Medicine

## 2016-04-23 ENCOUNTER — Ambulatory Visit (INDEPENDENT_AMBULATORY_CARE_PROVIDER_SITE_OTHER): Payer: Medicare Other | Admitting: Family Medicine

## 2016-04-23 ENCOUNTER — Ambulatory Visit
Admission: RE | Admit: 2016-04-23 | Discharge: 2016-04-23 | Disposition: A | Discharge status: Home / Self Care | Payer: Medicare Other | Source: Ambulatory Visit | Attending: Family Medicine | Admitting: Family Medicine

## 2016-04-23 VITALS — BP 122/78 | HR 88 | Temp 98.7°F | Wt 252.6 lb

## 2016-04-23 DIAGNOSIS — M19041 Primary osteoarthritis, right hand: Secondary | ICD-10-CM | POA: Diagnosis not present

## 2016-04-23 DIAGNOSIS — I10 Essential (primary) hypertension: Secondary | ICD-10-CM | POA: Diagnosis not present

## 2016-04-23 DIAGNOSIS — K219 Gastro-esophageal reflux disease without esophagitis: Secondary | ICD-10-CM

## 2016-04-23 DIAGNOSIS — S61011A Laceration without foreign body of right thumb without damage to nail, initial encounter: Secondary | ICD-10-CM | POA: Diagnosis not present

## 2016-04-23 DIAGNOSIS — E669 Obesity, unspecified: Secondary | ICD-10-CM

## 2016-04-23 DIAGNOSIS — Z1231 Encounter for screening mammogram for malignant neoplasm of breast: Secondary | ICD-10-CM | POA: Diagnosis not present

## 2016-04-23 MED ORDER — CEPHALEXIN 500 MG PO CAPS: 500.0000 mg | ORAL_CAPSULE | Freq: Four times a day (QID) | ORAL | Status: DC

## 2016-04-23 NOTE — Progress Notes (Signed)
    Subjective    Roberta [name] is a 60 y.o. female that presents for a follow-up visit for:   1. Weight: Patient states that she was told by her oncologist that she needs to lose weight in order to reduce cancer risk. She was referred for gastric bypass surgery but does not like the long term effects of bypass surgery. She has lost weight in the past by exercise and diet, but has had issues this time around with transportation to and from a gym. She would like to lose weight a little quicker without surgery.  2. Hypertension: Patient is adherent with hydrochlorothiazide and lisinopril. No chest pain or dyspnea.  3. GERD: Patient stable on Nexium. No concerns.  4. Thumb laceration: occurred two days ago when she was washing dishes. She lacerated the right aspect of her right thumb. She has been washing with soapy water. She has not covered it or used ice over the lesion to help with inflammation. She is unsure if she banged her thumb, as well. She has not used anything for pain.   Social History  Substance Use Topics  . Smoking status: Never Smoker   . Smokeless tobacco: None  . Alcohol Use: No    Allergies  Allergen Reactions  . Influenza Vaccines Anaphylaxis    Throat swelling reported after flu shot in the past (cannot verify with records but would not give)  . Budesonide Other (See Comments)    Throat infection per patient  . Prednisone Other (See Comments)    increased vaginal bleeding, able to tolerate low dose (40 mg)  . Flonase [Fluticasone Propionate] Other (See Comments)    Upper respitory  issues  . Codeine Nausea And Vomiting  . Morphine And Related Nausea And Vomiting  . Nickel Rash    No orders of the defined types were placed in this encounter.    ROS  Per HPI   Objective   BP 122/78 mmHg  Pulse 88  Temp(Src) 98.7 F (37.1 C) (Oral)  Wt 252 lb 9.6 oz (114.579 kg)  Vital signs reviewed  General: Well appearing, no distress Musculoskeletal: Right  thumb with small 0.5cm laceration. Swelling of interphalangeal joint with tenderness. Limited ROM.   Assessment and Plan    GERD (gastroesophageal reflux disease) S/p EGD. Per GI, okay to continue PPI as needed to control symptoms.  HYPERTENSION, BENIGN SYSTEMIC Controlled. No changes today.  OBESITY, NOS Patient was referred for gastric bypass surgery, however, she does not wish to undergo this procedure. She has been trying to lose weight for some time and would like to try weight loss pills. Will refer for this since we do not prescribe weight loss pills.  Thumb laceration, right, initial encounter Small laceration. Will allow healing by secondary intention. Significant tenderness over joint with some swelling. Will treat with 7 day course of antibiotics and obtain an x-ray to evaluate joint. - cephALEXin (KEFLEX) 500 MG capsule; Take 1 capsule (500 mg total) by mouth 4 (four) times daily.  Dispense: 28 capsule; Refill: 0 - DG Finger Thumb Right; Future

## 2016-04-23 NOTE — Patient Instructions (Signed)
Thank you for coming to see me today. It was a pleasure. Today we talked about:   Weight: I will refer you to start weight loss medications  Hypertension: your blood pressure looks good. No changes.  Reflux: Continue Nexium. No changes.  Thumb laceration: I will prescribe an antibiotic and get an x-ray of your thumb.  Please make an appointment to see your new doctor in three months or sooner if your thumb does not improve.  If you have any questions or concerns, please do not hesitate to call the office at [phone].  Sincerely,  Cordelia Poche, MD

## 2016-04-24 NOTE — Assessment & Plan Note (Signed)
Controlled. No changes today. 

## 2016-04-24 NOTE — Assessment & Plan Note (Signed)
S/p EGD. Per GI, okay to continue PPI as needed to control symptoms.

## 2016-04-24 NOTE — Assessment & Plan Note (Signed)
Patient was referred for gastric bypass surgery, however, she does not wish to undergo this procedure. She has been trying to lose weight for some time and would like to try weight loss pills. Will refer for this since we do not prescribe weight loss pills.

## 2016-04-26 ENCOUNTER — Other Ambulatory Visit: Payer: Self-pay | Admitting: Family Medicine

## 2016-04-26 NOTE — Telephone Encounter (Signed)
Spoke to pt. Informed her the xray she had on her thumb was neg per Dr. Lonny Prude. Ottis Stain, CMA

## 2016-04-27 ENCOUNTER — Encounter: Payer: Self-pay | Admitting: Family Medicine

## 2016-05-03 DIAGNOSIS — H524 Presbyopia: Secondary | ICD-10-CM | POA: Diagnosis not present

## 2016-05-03 DIAGNOSIS — H2513 Age-related nuclear cataract, bilateral: Secondary | ICD-10-CM | POA: Diagnosis not present

## 2016-05-04 DIAGNOSIS — H5213 Myopia, bilateral: Secondary | ICD-10-CM | POA: Diagnosis not present

## 2016-05-04 DIAGNOSIS — M5416 Radiculopathy, lumbar region: Secondary | ICD-10-CM | POA: Diagnosis not present

## 2016-05-13 DIAGNOSIS — I10 Essential (primary) hypertension: Secondary | ICD-10-CM | POA: Diagnosis not present

## 2016-05-13 DIAGNOSIS — R9431 Abnormal electrocardiogram [ECG] [EKG]: Secondary | ICD-10-CM | POA: Diagnosis not present

## 2016-05-19 DIAGNOSIS — I10 Essential (primary) hypertension: Secondary | ICD-10-CM | POA: Diagnosis not present

## 2016-05-19 DIAGNOSIS — R9431 Abnormal electrocardiogram [ECG] [EKG]: Secondary | ICD-10-CM | POA: Diagnosis not present

## 2016-05-25 DIAGNOSIS — M25561 Pain in right knee: Secondary | ICD-10-CM | POA: Diagnosis not present

## 2016-06-01 DIAGNOSIS — M47816 Spondylosis without myelopathy or radiculopathy, lumbar region: Secondary | ICD-10-CM | POA: Diagnosis not present

## 2016-06-11 DIAGNOSIS — I10 Essential (primary) hypertension: Secondary | ICD-10-CM | POA: Diagnosis not present

## 2016-06-16 ENCOUNTER — Encounter: Payer: Self-pay | Admitting: Family Medicine

## 2016-06-16 NOTE — Progress Notes (Signed)
Patient started on phentermine 37.5 mg daily by Roda Shutters at [facility] on 05/19/2016

## 2016-06-24 DIAGNOSIS — I10 Essential (primary) hypertension: Secondary | ICD-10-CM | POA: Diagnosis not present

## 2016-06-24 DIAGNOSIS — R9431 Abnormal electrocardiogram [ECG] [EKG]: Secondary | ICD-10-CM | POA: Diagnosis not present

## 2016-07-06 DIAGNOSIS — H40013 Open angle with borderline findings, low risk, bilateral: Secondary | ICD-10-CM | POA: Diagnosis not present

## 2016-07-23 ENCOUNTER — Other Ambulatory Visit: Payer: Self-pay | Admitting: *Deleted

## 2016-07-24 MED ORDER — LISINOPRIL 5 MG PO TABS: 5.0000 mg | ORAL_TABLET | Freq: Every day | ORAL | 0 refills | Status: DC

## 2016-07-27 ENCOUNTER — Ambulatory Visit (INDEPENDENT_AMBULATORY_CARE_PROVIDER_SITE_OTHER): Payer: Medicare Other | Admitting: Family Medicine

## 2016-07-27 ENCOUNTER — Ambulatory Visit
Admission: RE | Admit: 2016-07-27 | Discharge: 2016-07-27 | Disposition: A | Discharge status: Home / Self Care | Payer: Medicare Other | Source: Ambulatory Visit | Attending: Family Medicine | Admitting: Family Medicine

## 2016-07-27 ENCOUNTER — Encounter: Payer: Self-pay | Admitting: Family Medicine

## 2016-07-27 ENCOUNTER — Other Ambulatory Visit: Payer: Self-pay | Admitting: Family Medicine

## 2016-07-27 VITALS — BP 126/72 | HR 82 | Temp 97.8°F | Ht 66.5 in | Wt 239.8 lb

## 2016-07-27 DIAGNOSIS — M79671 Pain in right foot: Secondary | ICD-10-CM | POA: Diagnosis not present

## 2016-07-27 DIAGNOSIS — M7989 Other specified soft tissue disorders: Secondary | ICD-10-CM | POA: Diagnosis not present

## 2016-07-27 MED ORDER — LISINOPRIL 5 MG PO TABS: 5.0000 mg | ORAL_TABLET | Freq: Every day | ORAL | 0 refills | Status: DC

## 2016-07-27 NOTE — Progress Notes (Signed)
Subjective:    Roberta [name] is a 60 y.o. female who presents to [facility] today for foot pain:  1.  Foot pain:  Right foot pain present for the past 2 weeks. Patient cannot remember any injury. She states it feels "as if I dropped something on my foot" because the top of her foot hurts. However she did not drop anything that she can remember.  She did have bunionectomy and reconstruction of her foot in 2010. She states she is concerned that the screw in her foot has come loose. She has pain whenever she walks. She is not taking anything for pain relief because she is scared it will interact with her phentermine. No redness. No fevers or chills.    ROS as above per HPI  The following portions of the patient's history were reviewed and updated as appropriate: allergies, current medications, past medical history, family and social history, and problem list. Patient is a nonsmoker.    PMH reviewed.   Past Surgical History:  Procedure Laterality Date  . ANKLE SURGERY    . BIOPSY BREAST     LEFT  . BREAST CYST INCISION AND DRAINAGE     under breast  . BUNIONECTOMY     bilateral and toe correction  . CARPAL TUNNEL RELEASE Bilateral 2009  . CATARACT SURGERY    . CERVICAL SPINE SURGERY    . CHOLECYSTECTOMY  1980  . DILATATION & CURRETTAGE/HYSTEROSCOPY WITH RESECTOCOPE N/A 07/28/2015   Procedure: DILATATION & CURETTAGE/HYSTEROSCOPY WITH RESECTOCOPE;  Surgeon: Eldred Manges, MD;  Location: [facility];  Service: Gynecology;  Laterality: N/A;  . KNEE ARTHROSCOPY    . ROBOTIC ASSISTED TOTAL HYSTERECTOMY WITH BILATERAL SALPINGO OOPHERECTOMY Bilateral 09/04/2015   Procedure: ROBOTIC ASSISTED HYSTERECTOMY WITH BILATERAL SALPINGO OOPHORECTOMY SENTINAL NODE MAPPING ;  Surgeon: Everitt Amber, MD;  Location: [facility];  Service: Gynecology;  Laterality: Bilateral;  . ROTATOR CUFF REPAIR Right May 2014    Medications reviewed. Current Outpatient Prescriptions  Medication Sig Dispense Refill  . albuterol  (PROAIR HFA) 108 (90 Base) MCG/ACT inhaler Inhale 2 puffs into the lungs every 4 (four) hours as needed. 2 Inhaler 11  . albuterol (PROVENTIL) (2.5 MG/3ML) 0.083% nebulizer solution Take 3 mLs (2.5 mg total) by nebulization every 6 (six) hours as needed for wheezing or shortness of breath. (Patient not taking: Reported on 03/29/2016) 75 mL 1  . aspirin 81 MG tablet Take 81 mg by mouth daily.    . cephALEXin (KEFLEX) 500 MG capsule Take 1 capsule (500 mg total) by mouth 4 (four) times daily. 28 capsule 0  . cyclobenzaprine (FLEXERIL) 5 MG tablet Take 5 mg by mouth 3 (three) times daily as needed for muscle spasms.    Marland Kitchen esomeprazole (NEXIUM) 20 MG packet Take 20 mg by mouth.    Marland Kitchen guaiFENesin-dextromethorphan (ROBITUSSIN DM) 100-10 MG/5ML syrup Take 10 mLs by mouth every 6 (six) hours as needed for cough. 118 mL 0  . hydrochlorothiazide (HYDRODIURIL) 25 MG tablet Take 1 tablet (25 mg total) by mouth daily. 90 tablet 0  . lisinopril (PRINIVIL,ZESTRIL) 5 MG tablet Take 1 tablet (5 mg total) by mouth daily. 90 tablet 0  . Multiple Vitamins-Minerals (HAIR SKIN AND NAILS FORMULA PO) Take 1 tablet by mouth every morning.    . phentermine 37.5 MG capsule Take 37.5 mg by mouth every morning.    . Travoprost, BAK Free, (TRAVATAN) 0.004 % SOLN ophthalmic solution Place 1 drop into both eyes at bedtime. 2.5 mL 3  No current facility-administered medications for this visit.      Objective:   Physical Exam BP 126/72   Pulse 82   Temp 97.8 F (36.6 C) (Oral)   Ht 5' 6.5" (1.689 m)   Wt 239 lb 12.8 oz (108.8 kg)   BMI 38.12 kg/m  Gen:  Alert, cooperative patient who appears stated age in no acute distress.  Vital signs reviewed. MSK: Left foot within normal limits except for bunionectomy scar noted dorsal aspect of foot. Right foot with an x-ray scar noted. Well-healed. She does have tenderness to palpation directly over her third and fourth metacarpal dorsal aspect. Moderate to severe pain. No pain  plantar aspect of foot. She does have some swelling along the dorsum of her foot as well. No redness. No pain along the lateral aspect of foot. She does ambulate with an antalgic gait.  No results found for this or any previous visit (from the past 72 hour(s)).

## 2016-07-27 NOTE — Assessment & Plan Note (Signed)
Unclear etiology. She does act as if she has either a fracture or if the screw has indeed come loose in her foot. I am sending her for an x-ray now. Tylenol for pain relief if she needs it. I will call her with the results of her x-ray and we'll make a plan that point.

## 2016-07-27 NOTE — Patient Instructions (Signed)
I am worried that you do indeed have either a fracture or screw lose in your foot.  Go to the [facility] when it is best for you to get the x-ray.  They will send me the results and I will call you with the results.  If her having a lot of pain you can take the Tylenol for relief.  It was good to see you today.

## 2016-07-27 NOTE — Telephone Encounter (Signed)
Pt would like her lisinopril called into her pharmacy as soon as possible. Please advise. Thanks! ep

## 2016-07-28 ENCOUNTER — Telehealth: Payer: Self-pay | Admitting: Family Medicine

## 2016-07-28 MED ORDER — NAPROXEN 500 MG PO TABS: 500.0000 mg | ORAL_TABLET | Freq: Two times a day (BID) | ORAL | 0 refills | Status: DC | PRN

## 2016-07-28 NOTE — Telephone Encounter (Signed)
Called and discussed negative xray of foot with patient.  She has been off of it today and therefore it feels somewhat better.  I have sent in some naprosyn for her for pain relief for what sounds like tendonitis.  She already has appt with Sports Medicine scheduled for this Friday.  Encouraged her to keep this appt.  Patient was appreciative of call.

## 2016-07-29 DIAGNOSIS — M79671 Pain in right foot: Secondary | ICD-10-CM | POA: Diagnosis not present

## 2016-08-01 IMAGING — US US PELVIS COMPLETE
1 series · 13 of 25 positions shown · non-contrast
Comparison: None

CLINICAL DATA: Vaginal bleeding 3 days.  Postmenopausal.

EXAM:
TRANSABDOMINAL AND TRANSVAGINAL ULTRASOUND OF PELVIS
TECHNIQUE: Both transabdominal and transvaginal ultrasound examinations of the
pelvis were performed. Transabdominal technique was performed for
global imaging of the pelvis including uterus, ovaries, adnexal
regions, and pelvic cul-de-sac. It was necessary to proceed with
endovaginal exam following the transabdominal exam to visualize the
uterus and ovaries.

[Series 1: us pelvis complete · 0.21mm/px · 13 of 67 slices shown]
[im 1/67]
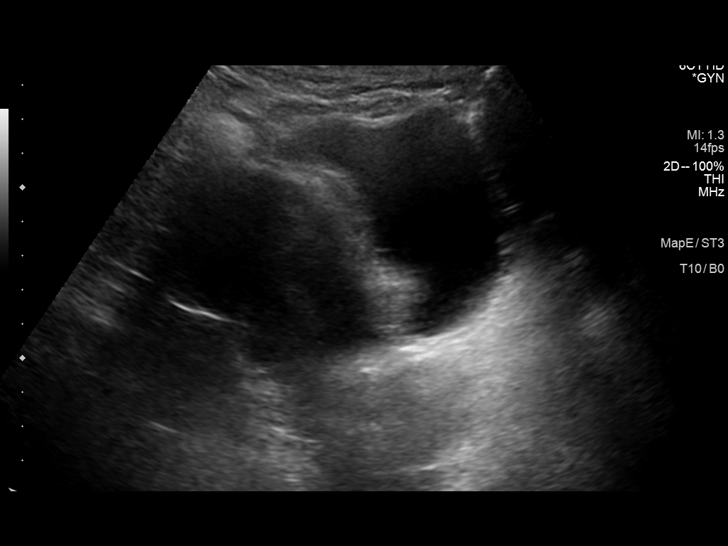
[im 6/67]
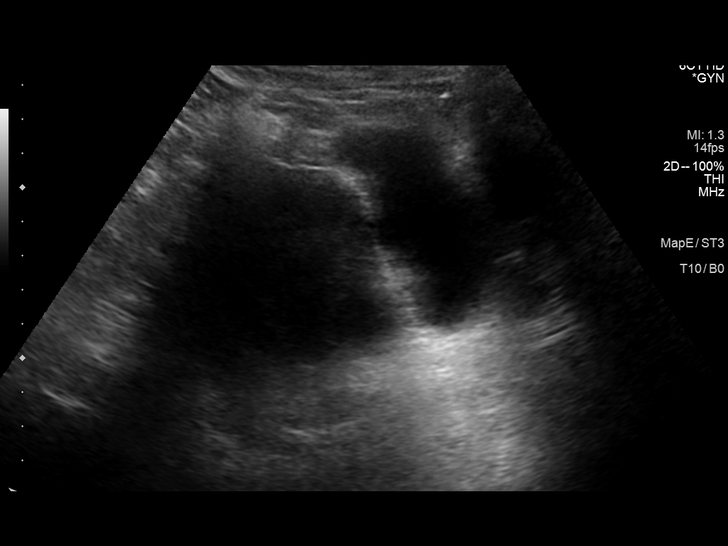
[im 12/67]
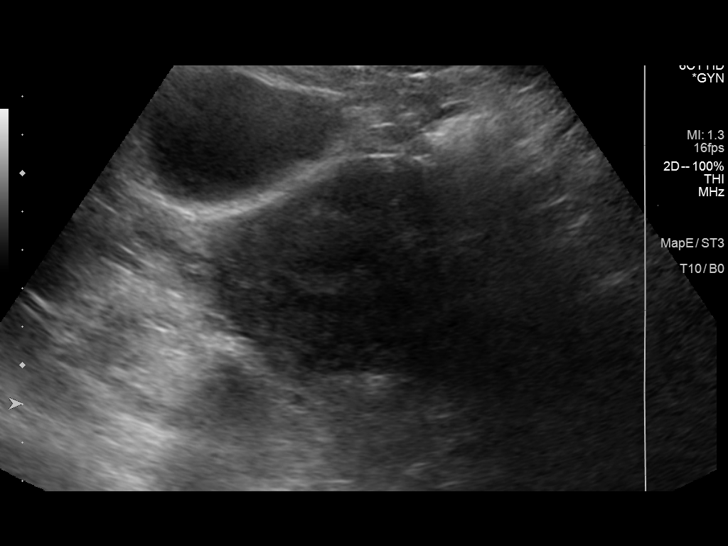
[im 17/67]
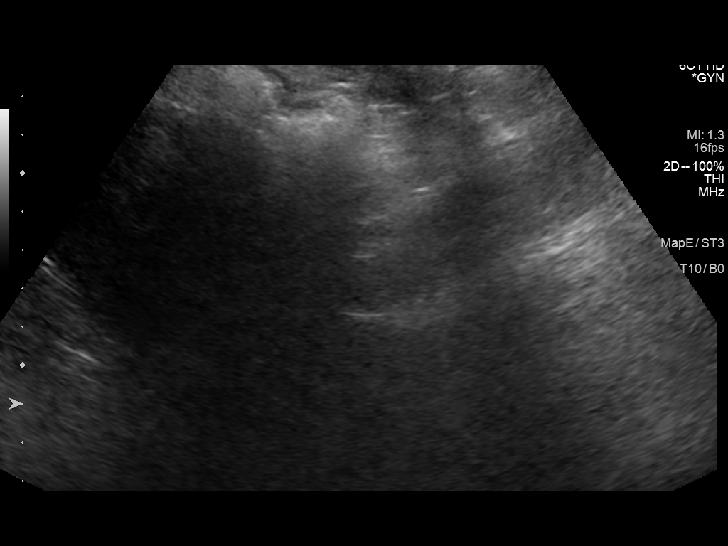
[im 23/67]
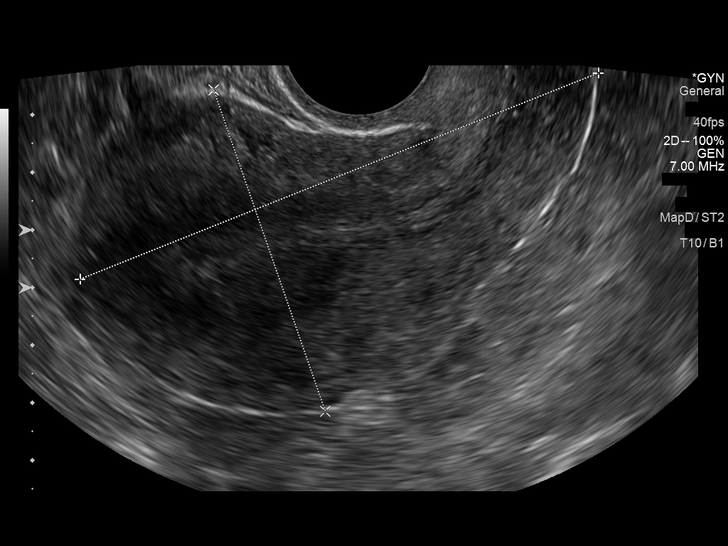
[im 28/67]
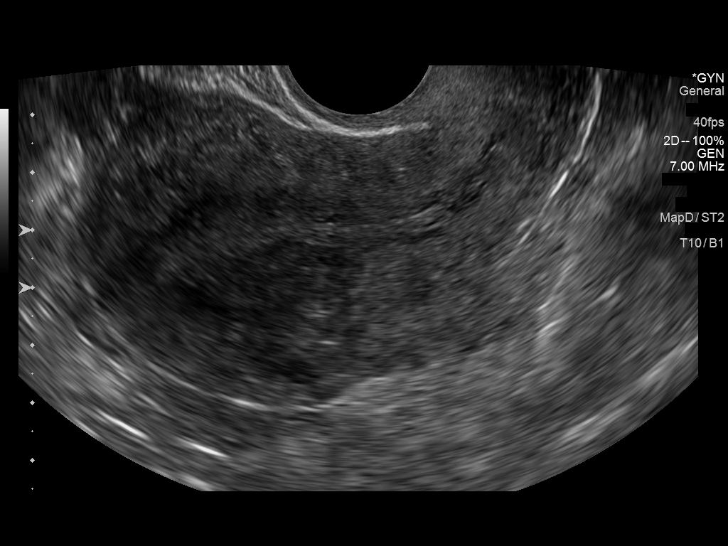
[im 34/67]
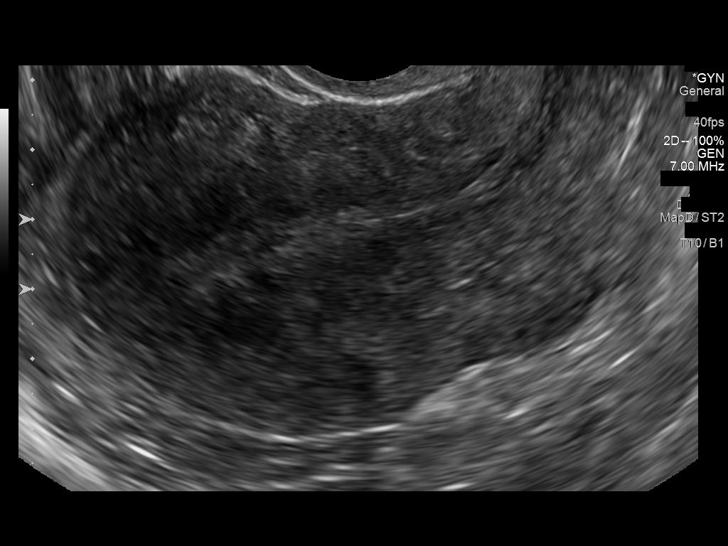
[im 39/67]
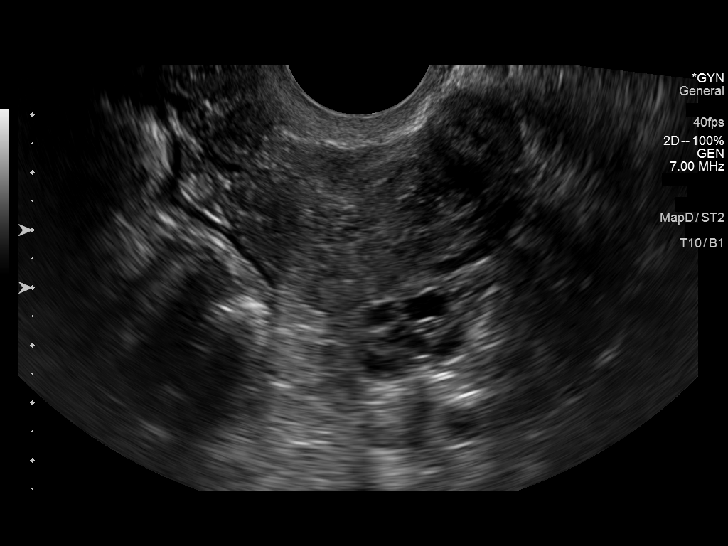
[im 45/67]
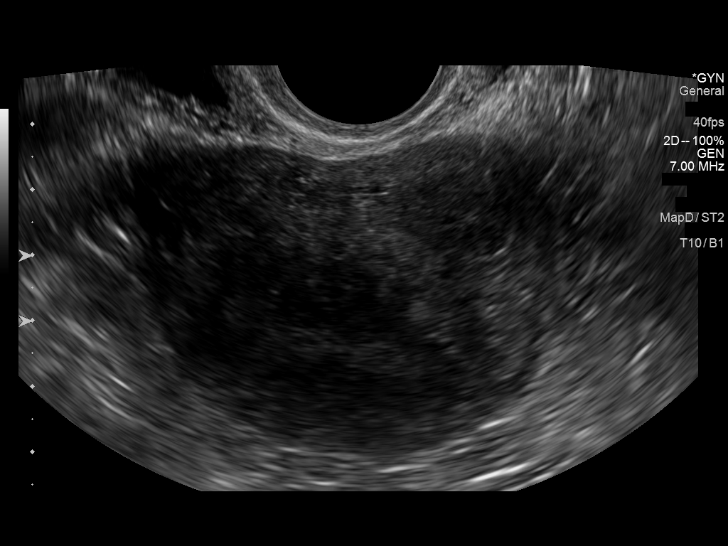
[im 50/67]
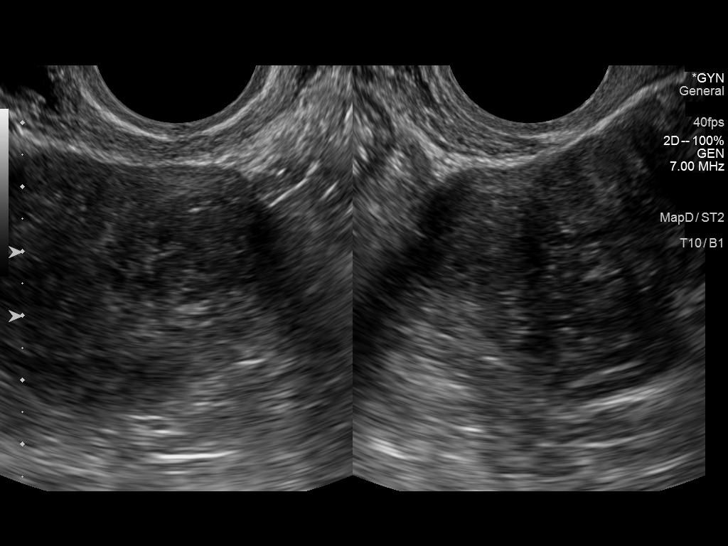
[im 56/67]
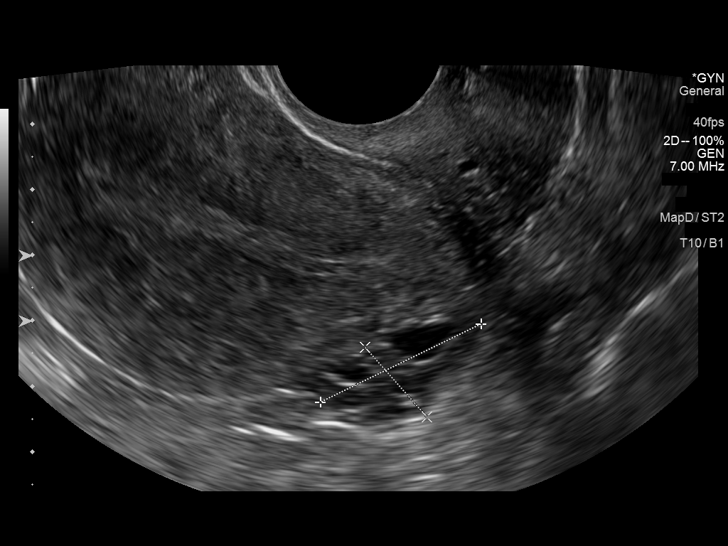
[im 61/67]
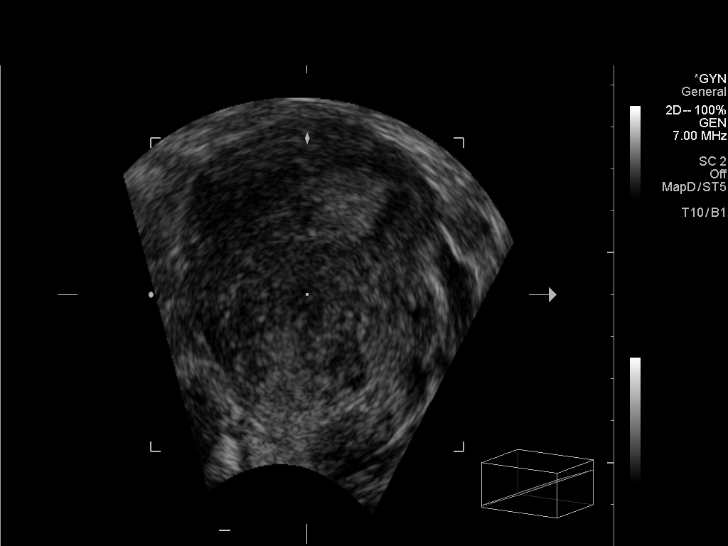
[im 67/67]
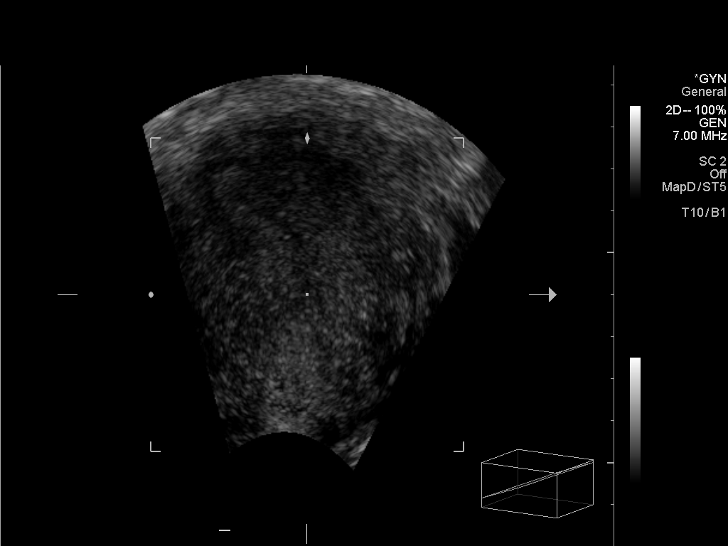

[13 of 25 positions shown; findings below may reference images not displayed]

FINDINGS: Uterus

Measurements: 9.7 x 5.9 x 6.4 cm. Left fundal fibroid measures 17 x
13 mm. Left lateral fundal fibroid measures 22 x 20 x 25 mm.

Endometrium

Thickness: 6 mm. Thickened for postmenopausal status.. No focal
abnormality visualized.

Right ovary

Measurements: . Not visualized

Left ovary

Measurements: 2.7 x 1.4 x 2.1 cm. Normal appearance/no adnexal mass.

Other findings

No free fluid.
IMPRESSION: Small uterine fibroids.

Endometrial thickening without focal mass.

In the setting of post-menopausal bleeding, this is consistent with
a benign etiology such as endometrial atrophy. If bleeding remains
unresponsive to hormonal or medical therapy, sonohysterogram should
be considered for focal lesion work-up. (Ref: Radiological
Reasoning: Algorithmic Workup of Abnormal Vaginal Bleeding with
Endovaginal Sonography and Sonohysterography. AJR [id number]; [id number])

## 2016-08-10 DIAGNOSIS — M1711 Unilateral primary osteoarthritis, right knee: Secondary | ICD-10-CM | POA: Diagnosis not present

## 2016-08-10 DIAGNOSIS — M79671 Pain in right foot: Secondary | ICD-10-CM | POA: Diagnosis not present

## 2016-08-19 DIAGNOSIS — M25571 Pain in right ankle and joints of right foot: Secondary | ICD-10-CM | POA: Diagnosis not present

## 2016-08-19 DIAGNOSIS — M25572 Pain in left ankle and joints of left foot: Secondary | ICD-10-CM | POA: Diagnosis not present

## 2016-08-19 DIAGNOSIS — I10 Essential (primary) hypertension: Secondary | ICD-10-CM | POA: Diagnosis not present

## 2016-08-19 DIAGNOSIS — M79671 Pain in right foot: Secondary | ICD-10-CM | POA: Diagnosis not present

## 2016-08-19 DIAGNOSIS — R9431 Abnormal electrocardiogram [ECG] [EKG]: Secondary | ICD-10-CM | POA: Diagnosis not present

## 2016-08-19 DIAGNOSIS — M79672 Pain in left foot: Secondary | ICD-10-CM | POA: Diagnosis not present

## 2016-08-26 DIAGNOSIS — H401130 Primary open-angle glaucoma, bilateral, stage unspecified: Secondary | ICD-10-CM | POA: Diagnosis not present

## 2016-08-26 DIAGNOSIS — I10 Essential (primary) hypertension: Secondary | ICD-10-CM | POA: Diagnosis not present

## 2016-08-26 DIAGNOSIS — H18423 Band keratopathy, bilateral: Secondary | ICD-10-CM | POA: Diagnosis not present

## 2016-08-27 DIAGNOSIS — M542 Cervicalgia: Secondary | ICD-10-CM | POA: Diagnosis not present

## 2016-09-22 ENCOUNTER — Encounter: Payer: Self-pay | Admitting: Family Medicine

## 2016-09-22 ENCOUNTER — Ambulatory Visit (INDEPENDENT_AMBULATORY_CARE_PROVIDER_SITE_OTHER): Payer: Medicare Other | Admitting: Family Medicine

## 2016-09-22 VITALS — BP 140/84 | HR 82 | Temp 98.1°F | Ht 67.0 in | Wt 242.0 lb

## 2016-09-22 DIAGNOSIS — Z0000 Encounter for general adult medical examination without abnormal findings: Secondary | ICD-10-CM

## 2016-09-22 DIAGNOSIS — M79671 Pain in right foot: Secondary | ICD-10-CM

## 2016-09-22 DIAGNOSIS — N898 Other specified noninflammatory disorders of vagina: Secondary | ICD-10-CM | POA: Insufficient documentation

## 2016-09-22 DIAGNOSIS — I10 Essential (primary) hypertension: Secondary | ICD-10-CM | POA: Diagnosis not present

## 2016-09-22 DIAGNOSIS — D508 Other iron deficiency anemias: Secondary | ICD-10-CM

## 2016-09-22 LAB — CBC
HCT: 38 % (ref 35.0–45.0)
Hemoglobin: 12.1 g/dL (ref 11.7–15.5)
MCH: 25.8 pg — AB (ref 27.0–33.0)
MCHC: 31.8 g/dL — AB (ref 32.0–36.0)
MCV: 81 fL (ref 80.0–100.0)
MPV: 9.7 fL (ref 7.5–12.5)
PLATELETS: 384 10*3/uL (ref 140–400)
RBC: 4.69 MIL/uL (ref 3.80–5.10)
RDW: 15 % (ref 11.0–15.0)
WBC: 7.2 10*3/uL (ref 3.8–10.8)

## 2016-09-22 LAB — BASIC METABOLIC PANEL
BUN: 19 mg/dL (ref 7–25)
CHLORIDE: 101 mmol/L (ref 98–110)
CO2: 26 mmol/L (ref 20–31)
CREATININE: 1.11 mg/dL — AB (ref 0.50–0.99)
Calcium: 9.3 mg/dL (ref 8.6–10.4)
Glucose, Bld: 100 mg/dL — ABNORMAL HIGH (ref 65–99)
Potassium: 4.1 mmol/L (ref 3.5–5.3)
Sodium: 137 mmol/L (ref 135–146)

## 2016-09-22 NOTE — Patient Instructions (Signed)
You were seen today for a follow up to discuss general medical problems.  You noted some discharge and itching, but since you are seeing Dr. Ronney Asters tomorrow I think it would be a good idea to discuss that problem at that point.  I am happy to prescribe you medication if needed.    I have ordered labs to see if your chemistries are normal and to assess whether you still have anemia.  I will let you know about these results.    Please call [facility] and have them fax the colonoscopy report to me.   Thanks so much, Takeya Marquis L. Rosalyn Gess, [place] [facility] Resident PGY-1 09/22/2016 4:08 PM

## 2016-09-22 NOTE — Assessment & Plan Note (Signed)
Patient reportedly had a right foot stress fracture diagnosed at [facility].  They have made her orthotics which she is planning to pick up in the next couple of weeks after insurance.  Denies any numbness or tingling at this time. - Follow-up as needed

## 2016-09-22 NOTE — Progress Notes (Signed)
    Subjective:  Roberta [name] is a 60 y.o. female who presents to the [facility] today ror general follow-up  HPI:  Hypertension: History of hypertension on hydrochlorothiazide and lisinopril. Patient denies any headaches, chest pain, shortness of breath, changes in vision or lightheadedness.   Anemia: Previously documented anemia. Hemoglobin has previously been stable. Patient denies symptoms.   Foot pain:  Was seen by Dr. Mingo Amber clinic recently and was diagnosed with suspected tendinitis.  Patient was seen by [facility] orthopedics and was found to have a healing stress fracture. She has been fitted for orthotics which she should be picking up in the next couple of weeks after insurance approves.  She denies any numbness, or tingling.   Discharge and vulvar itching:  For the past week or so she has noted some discharge and vulvar itching.  She is due for Pap smear today, however she is following up with Dr. Leo Grosser tomorrow who will be doing her GYN exam.  She denies any dysuria, blood, fevers or chills.  She has had yeast infections in the past and states that this feels a bit similar.  Will defer exam for Dr. Leo Grosser.   PMH: GERD ROS: See history of present illness  Objective:  Physical Exam: BP 140/84   Pulse 82   Temp 98.1 F (36.7 C) (Oral)   Ht 5\' 7"  (1.702 m)   Wt 242 lb (109.8 kg)   BMI 37.90 kg/m   Gen: 60 year old female in NAD, resting comfortably CV: RRR with no murmurs appreciated Pulm: NWOB, CTAB with no crackles, wheezes, or rhonchi GI: Normal bowel sounds present. Soft, Nontender, Nondistended. MSK: no edema, cyanosis, or clubbing noted Skin: warm, dry Neuro: grossly normal, moves all extremities Psych: Normal affect and thought content  No results found for this or any previous visit (from the past 72 hour(s)).   Assessment/Plan:  HYPERTENSION, BENIGN SYSTEMIC His blood pressure today is 140/84 she is asymptomatic.   - Continue current regimen - Follow-up  BMP  ANEMIA, OTHER, UNSPECIFIED Patient denies fatigue or shortness of breath.  Recent hemoglobin levels have been normal last checked in 2016. - Follow-up CBC  Right foot pain Patient reportedly had a right foot stress fracture diagnosed at [facility].  They have made her orthotics which she is planning to pick up in the next couple of weeks after insurance.  Denies any numbness or tingling at this time. - Follow-up as needed  Vaginal discharge Patient reported clear/white vaginal discharge for the past week or so.  Has had yeast infections in the past and states this feels similar.  Is seeing her gynecologist tomorrow who will perform a Pap at that point. Will defer exam to her. - Happy to prescribe medication for her if she calls the office   Routine health maintenance Patient to get Pap smear tomorrow with Dr. Leo Grosser and received colonoscopy within the last couple of years by Dr. Magnus Sinning at [facility].  - patient will fax colonoscopy records to me

## 2016-09-22 NOTE — Assessment & Plan Note (Signed)
Patient to get Pap smear tomorrow with Dr. Leo Grosser and received colonoscopy within the last couple of years by Dr. Magnus Sinning at [facility].  - patient will fax colonoscopy records to me

## 2016-09-22 NOTE — Assessment & Plan Note (Addendum)
His blood pressure today is 140/84 she is asymptomatic.   - Continue current regimen - Follow-up BMP

## 2016-09-22 NOTE — Assessment & Plan Note (Signed)
Patient reported clear/white vaginal discharge for the past week or so.  Has had yeast infections in the past and states this feels similar.  Is seeing her gynecologist tomorrow who will perform a Pap at that point. Will defer exam to her. - Happy to prescribe medication for her if she calls the office

## 2016-09-22 NOTE — Assessment & Plan Note (Signed)
Patient denies fatigue or shortness of breath.  Recent hemoglobin levels have been normal last checked in 2016. - Follow-up CBC

## 2016-09-23 DIAGNOSIS — Z13820 Encounter for screening for osteoporosis: Secondary | ICD-10-CM | POA: Diagnosis not present

## 2016-09-23 DIAGNOSIS — M810 Age-related osteoporosis without current pathological fracture: Secondary | ICD-10-CM | POA: Diagnosis not present

## 2016-09-23 DIAGNOSIS — Z01411 Encounter for gynecological examination (general) (routine) with abnormal findings: Secondary | ICD-10-CM | POA: Diagnosis not present

## 2016-09-23 DIAGNOSIS — M8580 Other specified disorders of bone density and structure, unspecified site: Secondary | ICD-10-CM | POA: Diagnosis not present

## 2016-09-23 DIAGNOSIS — C541 Malignant neoplasm of endometrium: Secondary | ICD-10-CM | POA: Diagnosis not present

## 2016-10-07 ENCOUNTER — Ambulatory Visit: Payer: Medicare Other | Admitting: Family Medicine

## 2016-10-12 DIAGNOSIS — Q6650 Congenital pes planus, unspecified foot: Secondary | ICD-10-CM | POA: Diagnosis not present

## 2016-10-12 DIAGNOSIS — M2141 Flat foot [pes planus] (acquired), right foot: Secondary | ICD-10-CM | POA: Diagnosis not present

## 2016-10-26 DIAGNOSIS — M1711 Unilateral primary osteoarthritis, right knee: Secondary | ICD-10-CM | POA: Diagnosis not present

## 2016-10-26 DIAGNOSIS — M25572 Pain in left ankle and joints of left foot: Secondary | ICD-10-CM | POA: Diagnosis not present

## 2016-11-03 ENCOUNTER — Other Ambulatory Visit: Payer: Self-pay | Admitting: *Deleted

## 2016-11-03 MED ORDER — HYDROCHLOROTHIAZIDE 25 MG PO TABS: 25.0000 mg | ORAL_TABLET | Freq: Every day | ORAL | 0 refills | Status: DC

## 2016-11-16 MED ORDER — HYDROCHLOROTHIAZIDE 25 MG PO TABS: 25.0000 mg | ORAL_TABLET | Freq: Every day | ORAL | 0 refills | Status: DC

## 2016-11-16 NOTE — Telephone Encounter (Signed)
Medication resent to patient's pharmacy.  Damisha Wolff L, RN  

## 2016-11-16 NOTE — Addendum Note (Signed)
Addended by: Derl Barrow on: 11/16/2016 09:54 AM   Modules accepted: Orders

## 2016-11-16 NOTE — Telephone Encounter (Signed)
Pt said pharmacy doesn't have Rx. Please advise.Thanks! ep

## 2016-11-18 DIAGNOSIS — R9431 Abnormal electrocardiogram [ECG] [EKG]: Secondary | ICD-10-CM | POA: Diagnosis not present

## 2016-11-18 DIAGNOSIS — I10 Essential (primary) hypertension: Secondary | ICD-10-CM | POA: Diagnosis not present

## 2016-12-03 DIAGNOSIS — M67911 Unspecified disorder of synovium and tendon, right shoulder: Secondary | ICD-10-CM | POA: Diagnosis not present

## 2016-12-28 DIAGNOSIS — S83207D Unspecified tear of unspecified meniscus, current injury, left knee, subsequent encounter: Secondary | ICD-10-CM | POA: Diagnosis not present

## 2016-12-28 DIAGNOSIS — M25562 Pain in left knee: Secondary | ICD-10-CM | POA: Diagnosis not present

## 2016-12-29 DIAGNOSIS — M25562 Pain in left knee: Secondary | ICD-10-CM | POA: Diagnosis not present

## 2017-01-03 ENCOUNTER — Other Ambulatory Visit: Payer: Self-pay | Admitting: *Deleted

## 2017-01-03 MED ORDER — ONDANSETRON HCL 4 MG PO TABS: 4.0000 mg | ORAL_TABLET | Freq: Three times a day (TID) | ORAL | 0 refills | Status: DC | PRN

## 2017-01-11 DIAGNOSIS — M25562 Pain in left knee: Secondary | ICD-10-CM | POA: Diagnosis not present

## 2017-01-14 ENCOUNTER — Encounter: Payer: Self-pay | Admitting: Family Medicine

## 2017-01-14 ENCOUNTER — Ambulatory Visit (INDEPENDENT_AMBULATORY_CARE_PROVIDER_SITE_OTHER): Payer: Medicare Other | Admitting: Family Medicine

## 2017-01-14 DIAGNOSIS — I10 Essential (primary) hypertension: Secondary | ICD-10-CM | POA: Diagnosis not present

## 2017-01-14 NOTE — Assessment & Plan Note (Signed)
Hypertension well controlled with lisinopril and hydrochlorothiazide. Asymptomatic and compliant. -Continue current regimen and follow-up in 4 months

## 2017-01-14 NOTE — Patient Instructions (Signed)
Roberta [name], you were seen today for a blood pressure follow-up and your blood pressure is well controlled.  We discussed spacing out your visits a little further and have decided on every 4 months.   There is very nice seeing you keep up the good work.  [name] L. Rosalyn Gess, [place] Medicine Resident PGY-1 01/14/2017 4:35 PM

## 2017-01-14 NOTE — Progress Notes (Signed)
    Subjective:  Roberta [name] is a 61 y.o. female who presents to the [facility] today For blood pressure follow-up   HPI:  Hypertension: History of hypertension on hydrochlorothiazide lisinopril. Patient denies any headaches, chest pain, shortness of breath changes in vision or lightheadedness. She is compliant with her medications.  PMH: Hyperlipidemia, obesity Tobacco use: None Medication: reviewed and updated ROS: see HPI   Objective:  Physical Exam: BP 136/82   Pulse 77   Temp 98.1 F (36.7 C) (Oral)   Wt 251 lb (113.9 kg)   BMI 39.31 kg/m   Gen: 61 year old female in NAD, resting comfortably CV: RRR with no murmurs appreciated Pulm: NWOB, CTAB with no crackles, wheezes, or rhonchi GI: Normal bowel sounds present. Soft, Nontender, Nondistended. MSK: no edema, cyanosis, or clubbing noted Skin: warm, dry Neuro: grossly normal, moves all extremities Psych: Normal affect and thought content  No results found for this or any previous visit (from the past 72 hour(s)).   Assessment/Plan:  HYPERTENSION, BENIGN SYSTEMIC Hypertension well controlled with lisinopril and hydrochlorothiazide. Asymptomatic and compliant. -Continue current regimen and follow-up in 4 months

## 2017-01-15 DIAGNOSIS — M25562 Pain in left knee: Secondary | ICD-10-CM | POA: Diagnosis not present

## 2017-01-18 DIAGNOSIS — M1711 Unilateral primary osteoarthritis, right knee: Secondary | ICD-10-CM | POA: Diagnosis not present

## 2017-01-18 DIAGNOSIS — M67911 Unspecified disorder of synovium and tendon, right shoulder: Secondary | ICD-10-CM | POA: Diagnosis not present

## 2017-01-18 DIAGNOSIS — M25562 Pain in left knee: Secondary | ICD-10-CM | POA: Diagnosis not present

## 2017-01-18 DIAGNOSIS — S83207A Unspecified tear of unspecified meniscus, current injury, left knee, initial encounter: Secondary | ICD-10-CM | POA: Diagnosis not present

## 2017-01-25 ENCOUNTER — Encounter: Payer: Self-pay | Admitting: Family Medicine

## 2017-01-25 ENCOUNTER — Ambulatory Visit (INDEPENDENT_AMBULATORY_CARE_PROVIDER_SITE_OTHER): Payer: Medicare Other | Admitting: Family Medicine

## 2017-01-25 DIAGNOSIS — R05 Cough: Secondary | ICD-10-CM

## 2017-01-25 DIAGNOSIS — M1711 Unilateral primary osteoarthritis, right knee: Secondary | ICD-10-CM | POA: Diagnosis not present

## 2017-01-25 DIAGNOSIS — R059 Cough, unspecified: Secondary | ICD-10-CM

## 2017-01-25 MED ORDER — GUAIFENESIN-DM 100-10 MG/5ML PO SYRP: 10.0000 mL | ORAL_SOLUTION | Freq: Four times a day (QID) | ORAL | 0 refills | Status: DC | PRN

## 2017-01-25 NOTE — Patient Instructions (Signed)
Upper Respiratory Infection, Adult Most upper respiratory infections (URIs) are caused by a virus. A URI affects the nose, throat, and upper air passages. The most common type of URI is often called "the common cold." Follow these instructions at home:  Take medicines only as told by your doctor.  Gargle warm saltwater or take cough drops to comfort your throat as told by your doctor.  Use a warm mist humidifier or inhale steam from a shower to increase air moisture. This may make it easier to breathe.  Drink enough fluid to keep your pee (urine) clear or pale yellow.  Eat soups and other clear broths.  Have a healthy diet.  Rest as needed.  Go back to work when your fever is gone or your doctor says it is okay.  You may need to stay home longer to avoid giving your URI to others.  You can also wear a face mask and wash your hands often to prevent spread of the virus.  Use your inhaler more if you have asthma.  Do not use any tobacco products, including cigarettes, chewing tobacco, or electronic cigarettes. If you need help quitting, ask your doctor. Contact a doctor if:  You are getting worse, not better.  Your symptoms are not helped by medicine.  You have chills.  You are getting more short of breath.  You have brown or red mucus.  You have yellow or brown discharge from your nose.  You have pain in your face, especially when you bend forward.  You have a fever.  You have puffy (swollen) neck glands.  You have pain while swallowing.  You have white areas in the back of your throat. Get help right away if:  You have very bad or constant:  Headache.  Ear pain.  Pain in your forehead, behind your eyes, and over your cheekbones (sinus pain).  Chest pain.  You have long-lasting (chronic) lung disease and any of the following:  Wheezing.  Long-lasting cough.  Coughing up blood.  A change in your usual mucus.  You have a stiff neck.  You have  changes in your:  Vision.  Hearing.  Thinking.  Mood. This information is not intended to replace advice given to you by your health care provider. Make sure you discuss any questions you have with your health care provider. Document Released: 05/24/2008 Document Revised: 08/08/2016 Document Reviewed: 03/13/2014 Elsevier Interactive Patient Education  2017 Elsevier Inc.  

## 2017-01-25 NOTE — Progress Notes (Signed)
    Subjective:  Roberta [name] is a 61 y.o. female who presents to the [facility] today with a chief complaint of cough.   HPI:  Symptoms started a 4 days ago. Associated symptoms include sore throat, sinus congestion, sinus pressure, and rhinorrhea. No fevers. No myalgias. No medications tried. Had a little more shortness of breath and wheezing yesterday. Albuterol helped. Had a sick contact last week.  ROS: Per HPI  Objective:  Physical Exam: BP 104/62   Pulse 100   Temp 98.2 F (36.8 C) (Oral)   Wt 248 lb (112.5 kg)   SpO2 99%   BMI 38.84 kg/m   Gen: NAD, resting comfortably HEENT: TMs clear bilaterally. OP clear. MMM CV: RRR with no murmurs appreciated Pulm: NWOB, CTAB with no crackles, wheezes, or rhonchi MSK: no edema, cyanosis, or clubbing noted Skin: warm, dry Neuro: grossly normal, moves all extremities Psych: Normal affect and thought content  Assessment/Plan:  Viral URI with Cough Symptoms consistent with viral nifection. No red flag signs or symptoms. No signs of asthma exacerbation. Will treat symptomatically with robitussin. Patient deferred atrovent nasal spray. Return precautions reviewed. Follow up as needed.   Algis Greenhouse. Jerline Pain, [place] Resident PGY-3 01/25/2017 9:22 AM

## 2017-01-26 ENCOUNTER — Other Ambulatory Visit: Payer: Self-pay | Admitting: Family Medicine

## 2017-01-26 NOTE — Telephone Encounter (Signed)
Patient calls, needs a RX for hose and mask replacement for her nebulizer. Please send this to [facility] on [address]. She also needs a RX for albuterol nebulizer solutions. Please send to [facility] on [address].

## 2017-01-27 NOTE — Telephone Encounter (Signed)
Pt needs the albuterol nebulizer solution today. Pt would like it to go to [facility] on [address] today. ep

## 2017-01-28 DIAGNOSIS — M67911 Unspecified disorder of synovium and tendon, right shoulder: Secondary | ICD-10-CM | POA: Diagnosis not present

## 2017-01-28 MED ORDER — ALBUTEROL SULFATE (2.5 MG/3ML) 0.083% IN NEBU
2.5000 mg | INHALATION_SOLUTION | Freq: Four times a day (QID) | RESPIRATORY_TRACT | 1 refills | Status: DC | PRN
Start: 2017-01-28 — End: 2021-04-03

## 2017-01-31 NOTE — Telephone Encounter (Signed)
Rx was printed, not sent electronically.  Called in verbally and pt was informed. Rhegan Trunnell, Salome Spotted, CMA

## 2017-02-01 DIAGNOSIS — M1711 Unilateral primary osteoarthritis, right knee: Secondary | ICD-10-CM | POA: Diagnosis not present

## 2017-02-07 ENCOUNTER — Other Ambulatory Visit: Payer: Self-pay | Admitting: Family Medicine

## 2017-02-07 IMAGING — CR DG FOOT COMPLETE 3+V*R*
3 series · 3 of 3 positions shown · non-contrast
Comparison: None

CLINICAL DATA: Trauma, swelling, dropped heavy object on right foot
1 week ago

EXAM:
RIGHT FOOT COMPLETE - 3+ VIEW

[x foot ap right]
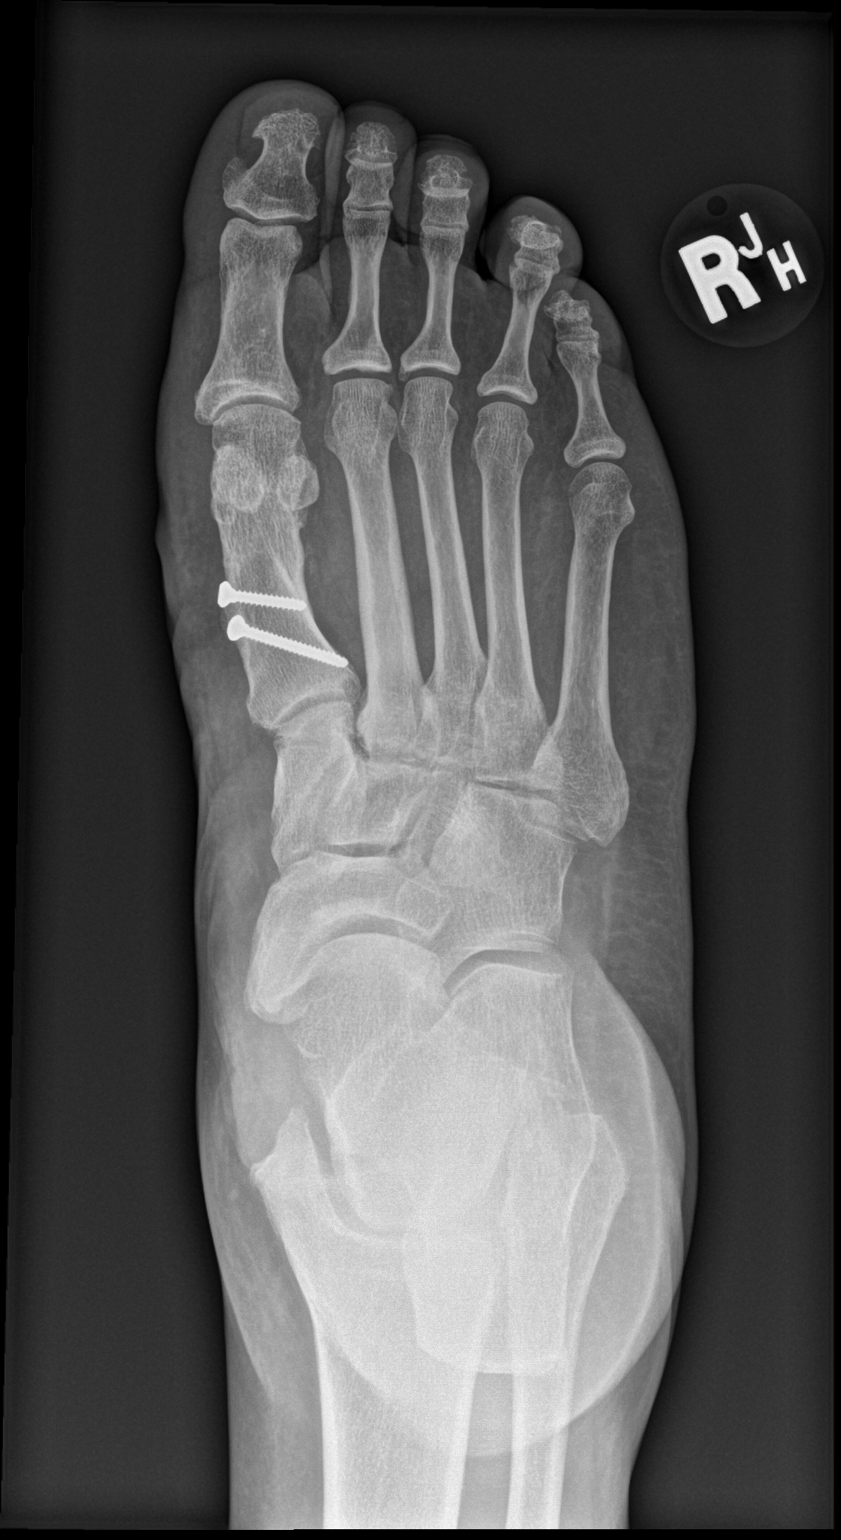

[x foot obl right]
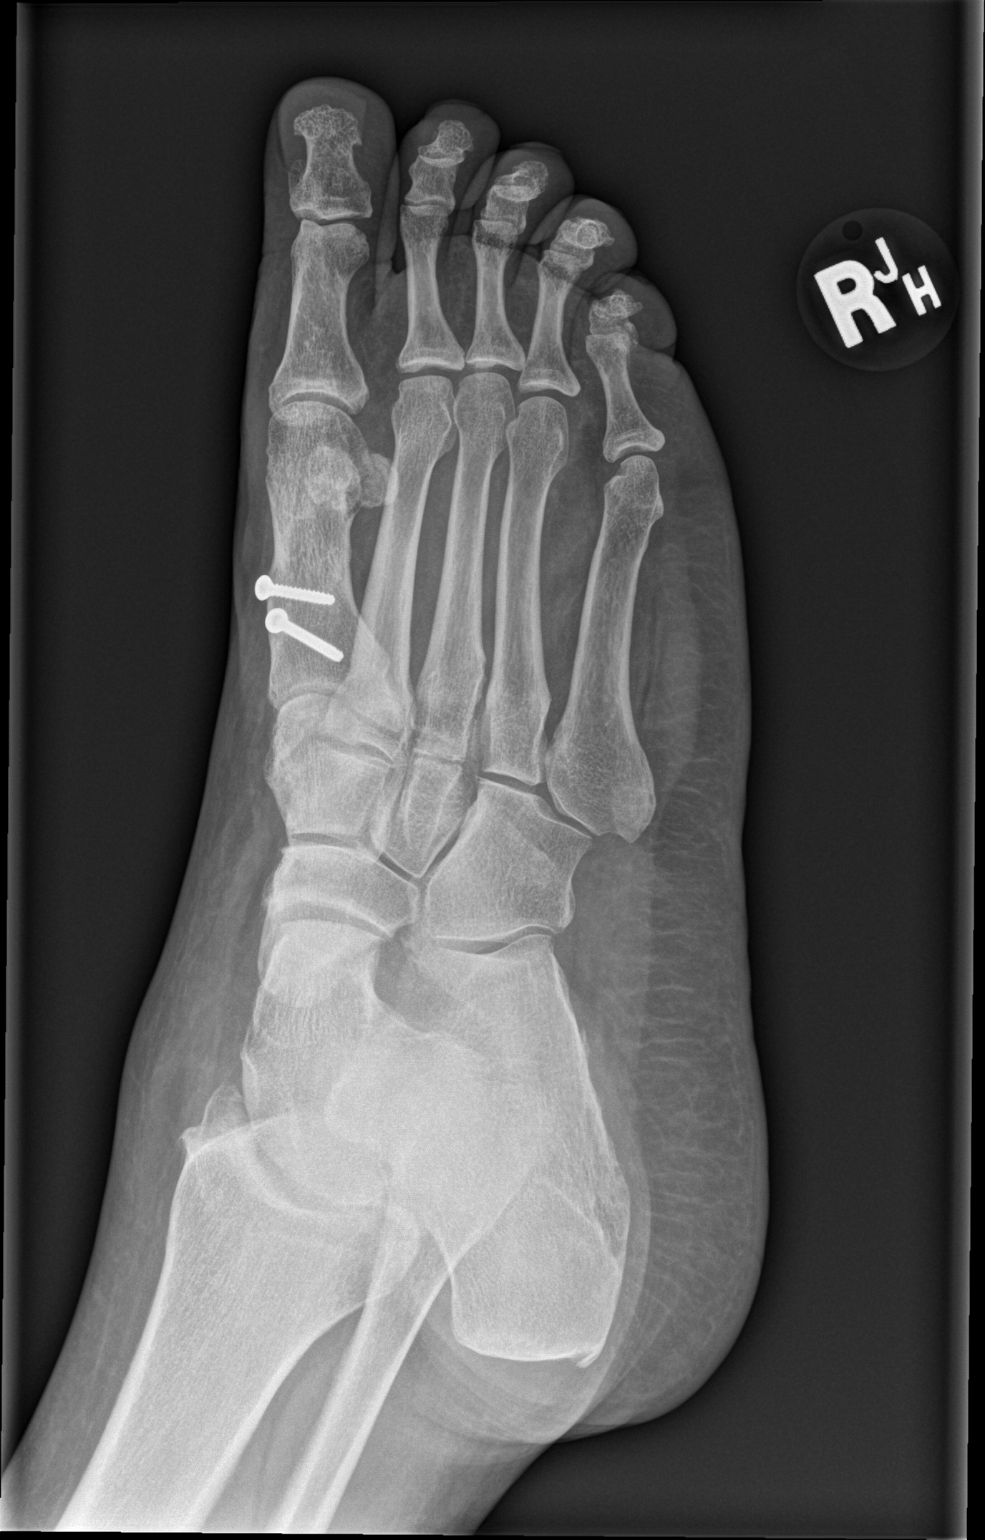

[x foot lat right]
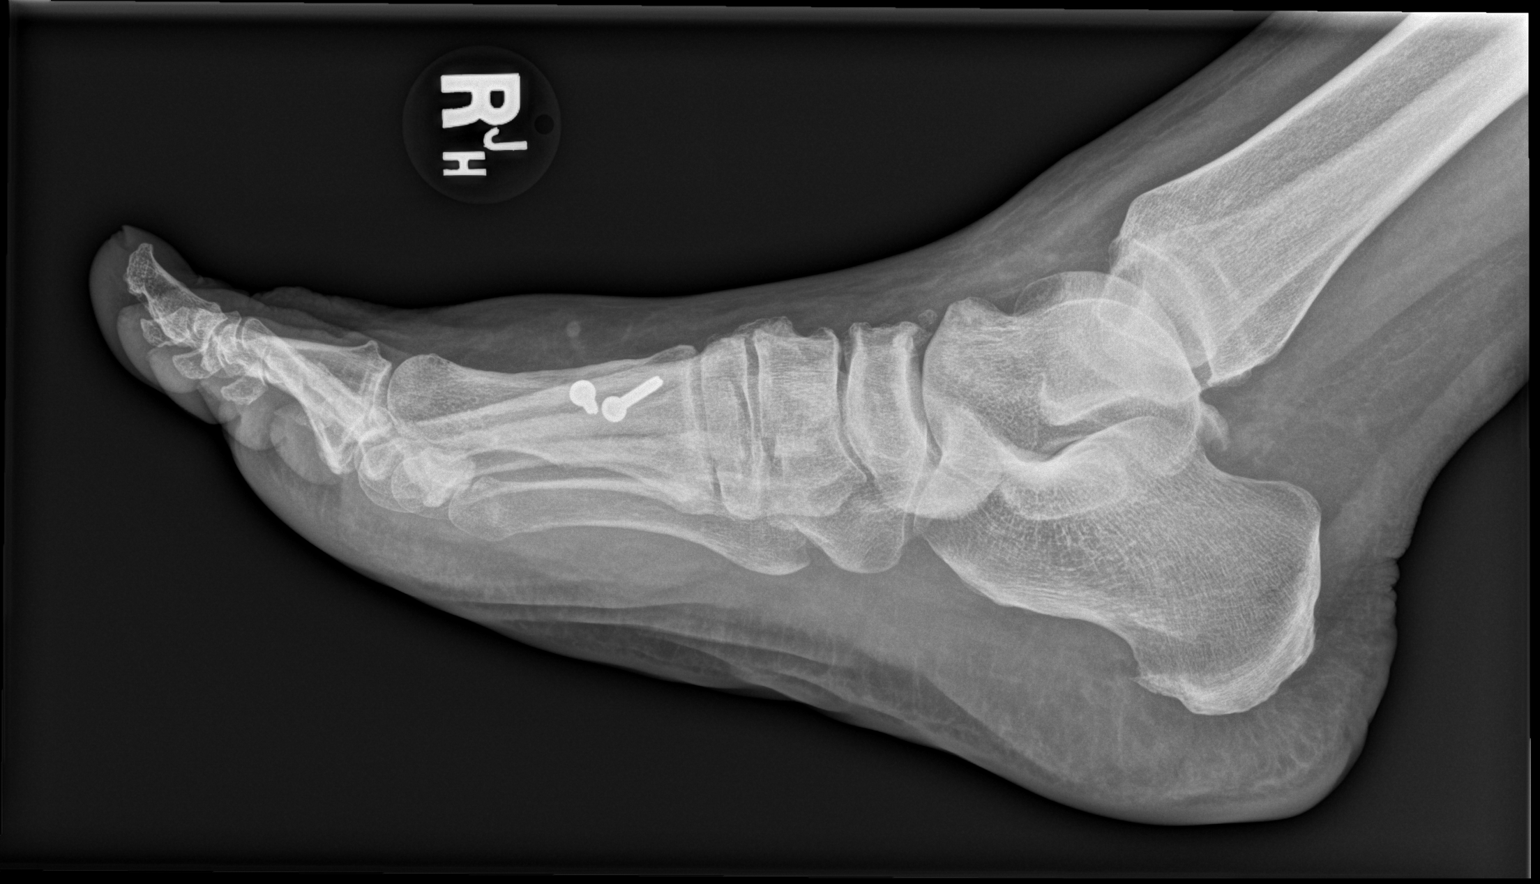

[3 of 3 positions shown; findings below may reference images not displayed]

FINDINGS: Three views of the right foot submitted. No acute fracture or
subluxation. Metallic fixation screws are noted in proximal first
metatarsal. Mild degenerative changes first metatarsal phalangeal
joint. Mild dorsal spurring tarsal region. Tiny plantar spur of
calcaneus.
IMPRESSION: No acute fracture or subluxation. Postsurgical changes first
metatarsal. Mild degenerative changes first metatarsal phalangeal
joint. Mild dorsal spurring tarsal region. Tiny plantar spur of
calcaneus.

## 2017-02-07 NOTE — Telephone Encounter (Signed)
Pt needs a refill on lisinopril and only has a couple days left. Pt uses [facility] on [address]. Pt wants to pick it up today. ep

## 2017-02-08 ENCOUNTER — Telehealth: Payer: Self-pay | Admitting: Family Medicine

## 2017-02-08 DIAGNOSIS — M1711 Unilateral primary osteoarthritis, right knee: Secondary | ICD-10-CM | POA: Diagnosis not present

## 2017-02-08 NOTE — Telephone Encounter (Signed)
Patient went to pick up her prescription for Linsinopril and it was not ready/there yet.  Can you please call and check on the status of this for patient?  Thank you

## 2017-02-08 NOTE — Telephone Encounter (Signed)
Informed pt that I contacted the pharmacy and they do have the Rx ready for pick up. Katharina Caper, Jaylei Fuerte D, [place]

## 2017-02-15 DIAGNOSIS — M1711 Unilateral primary osteoarthritis, right knee: Secondary | ICD-10-CM | POA: Diagnosis not present

## 2017-02-15 DIAGNOSIS — M1712 Unilateral primary osteoarthritis, left knee: Secondary | ICD-10-CM | POA: Diagnosis not present

## 2017-02-16 DIAGNOSIS — I10 Essential (primary) hypertension: Secondary | ICD-10-CM | POA: Diagnosis not present

## 2017-02-18 DIAGNOSIS — M25511 Pain in right shoulder: Secondary | ICD-10-CM | POA: Diagnosis not present

## 2017-02-23 DIAGNOSIS — M75111 Incomplete rotator cuff tear or rupture of right shoulder, not specified as traumatic: Secondary | ICD-10-CM | POA: Diagnosis not present

## 2017-03-15 DIAGNOSIS — M25562 Pain in left knee: Secondary | ICD-10-CM | POA: Diagnosis not present

## 2017-03-15 DIAGNOSIS — M25561 Pain in right knee: Secondary | ICD-10-CM | POA: Diagnosis not present

## 2017-03-17 DIAGNOSIS — R296 Repeated falls: Secondary | ICD-10-CM | POA: Diagnosis not present

## 2017-03-17 DIAGNOSIS — H18423 Band keratopathy, bilateral: Secondary | ICD-10-CM | POA: Diagnosis not present

## 2017-03-17 DIAGNOSIS — Q7649 Other congenital malformations of spine, not associated with scoliosis: Secondary | ICD-10-CM | POA: Diagnosis not present

## 2017-03-17 DIAGNOSIS — H401131 Primary open-angle glaucoma, bilateral, mild stage: Secondary | ICD-10-CM | POA: Diagnosis not present

## 2017-03-17 DIAGNOSIS — G992 Myelopathy in diseases classified elsewhere: Secondary | ICD-10-CM | POA: Diagnosis not present

## 2017-03-17 DIAGNOSIS — I10 Essential (primary) hypertension: Secondary | ICD-10-CM | POA: Diagnosis not present

## 2017-03-17 DIAGNOSIS — H26492 Other secondary cataract, left eye: Secondary | ICD-10-CM | POA: Diagnosis not present

## 2017-03-17 DIAGNOSIS — M25562 Pain in left knee: Secondary | ICD-10-CM | POA: Diagnosis not present

## 2017-03-24 DIAGNOSIS — M1711 Unilateral primary osteoarthritis, right knee: Secondary | ICD-10-CM | POA: Diagnosis not present

## 2017-03-27 NOTE — Progress Notes (Signed)
POSTOPERATIVE FOLLOWUP: endometrial cancer  Assessment:    61 y.o. year old with Stage IA Grade 1 endometrioid endometrial cancer.   S/p robotic assisted total hysterectomy, BSO and bilateral SLN biopsy on 09/04/15. no LVSI, 5% myometrial invasion, neg pelvic washings and negative lymph nodes. Morbid obesity (BMI 40kg/m2)   Plan: 1) endometrial cancer: no evidence for recurrence on today's exam. Counseled regarding symptoms consistent with recurrence. 2) obesity: counseled patient about the association between obesity and endometrial cancer and poor prognosis. Patient interested in bariatric surgery. Provided her with information about the program at [facility]. 3)  Return to clinic in 6 months with Dr Leo Grosser and then 12 months to see me.  HPI:  Roberta [name] is a 61 y.o. year old [id number] initially seen in consultation on 08/11/15 for endometrial cancer.  She then underwent a robotic hysterectomy, BSO and bilateral sentinel node biopsy on 9/67/59 without complications.  Her postoperative course was uncomplicated.  Her final pathologic diagnosis is a Stage IA Grade 1 endometrioid endometrial cancer with no lymphovascular space invasion, 1/17 mm (5%) of myometrial invasion and negative lymph nodes (SLN's were removed from bilateral basins, however, no nodal tissue was removed from the left SLN specimen, therefore nodal evaluation was only complete on the right).  Interval Hx: She is seen today for a routine cancer surveillance visit.  Since surgery she is feeling well.  Her weight is stable and she has difficulty losing weight. 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 She has no other complaints today.  Current Outpatient Prescriptions on File Prior to Visit  Medication Sig Dispense Refill  . albuterol (PROAIR HFA) 108 (90 Base) MCG/ACT inhaler Inhale 2 puffs into the lungs every 4 (four) hours as needed. 2 Inhaler 11  .  albuterol (PROVENTIL) (2.5 MG/3ML) 0.083% nebulizer solution Take 3 mLs (2.5 mg total) by nebulization every 6 (six) hours as needed for wheezing or shortness of breath. 75 mL 1  . aspirin 81 MG tablet Take 81 mg by mouth daily.    . cyclobenzaprine (FLEXERIL) 5 MG tablet Take 5 mg by mouth 3 (three) times daily as needed for muscle spasms.    Marland Kitchen esomeprazole (NEXIUM) 20 MG packet Take 20 mg by mouth.    . hydrochlorothiazide (HYDRODIURIL) 25 MG tablet Take 1 tablet (25 mg total) by mouth daily. 90 tablet 0  . lisinopril (PRINIVIL,ZESTRIL) 5 MG tablet Take 1 tablet (5 mg total) by mouth daily. 90 tablet 0  . Multiple Vitamins-Minerals (HAIR SKIN AND NAILS FORMULA PO) Take 1 tablet by mouth every morning.    . Travoprost, BAK Free, (TRAVATAN) 0.004 % SOLN ophthalmic solution Place 1 drop into both eyes at bedtime. 2.5 mL 3   No current facility-administered medications on file prior to visit.    Allergies  Allergen Reactions  . Influenza Vaccines Anaphylaxis    Throat swelling reported after flu shot in the past (cannot verify with records but would not give)  . Budesonide Other (See Comments)    Throat infection per patient  . Prednisone Other (See Comments)    increased vaginal bleeding, able to tolerate low dose (40 mg)  . Flonase [Fluticasone Propionate] Other (See Comments)    Upper respitory  issues  . Codeine Nausea And Vomiting  . Morphine And Related Nausea And Vomiting  . Nickel Rash   Past Medical History:  Diagnosis Date  . Abnormal EKG    nonspecific  ST and T-wave changes - Followed by Dr.Berry  . ALLERGIC RHINITIS 10/14/2010  . Anemia   . Asthma   . ASTHMA, INTERMITTENT 02/16/2007  . BACK PAIN, CHRONIC 03/25/2009  . CERVICAL RADICULOPATHY 10/16/2009  . DDD (degenerative disc disease), lumbosacral   . Depression   . DEPRESSIVE DISORDER NOT ELSEWHERE CLASSIFIED 03/25/2009  . Endometrial cancer ([place])   . GERD (gastroesophageal reflux disease) 09/09/2011  . Glaucoma    sees  optho every 3 months, drops each night  . Hyperlipidemia   . Hypertension   . PONV (postoperative nausea and vomiting)   . Postmenopausal vaginal bleeding 09/24/2014   Past Surgical History:  Procedure Laterality Date  . ANKLE SURGERY    . BIOPSY BREAST     LEFT  . BREAST CYST INCISION AND DRAINAGE     under breast  . BUNIONECTOMY     bilateral and toe correction  . CARPAL TUNNEL RELEASE Bilateral 2009  . CATARACT SURGERY    . CERVICAL SPINE SURGERY    . CHOLECYSTECTOMY  1980  . DILATATION & CURRETTAGE/HYSTEROSCOPY WITH RESECTOCOPE N/A 07/28/2015   Procedure: DILATATION & CURETTAGE/HYSTEROSCOPY WITH RESECTOCOPE;  Surgeon: Eldred Manges, MD;  Location: [facility];  Service: Gynecology;  Laterality: N/A;  . KNEE ARTHROSCOPY    . ROBOTIC ASSISTED TOTAL HYSTERECTOMY WITH BILATERAL SALPINGO OOPHERECTOMY Bilateral 09/04/2015   Procedure: ROBOTIC ASSISTED HYSTERECTOMY WITH BILATERAL SALPINGO OOPHORECTOMY SENTINAL NODE MAPPING ;  Surgeon: Everitt Amber, MD;  Location: [facility];  Service: Gynecology;  Laterality: Bilateral;  . ROTATOR CUFF REPAIR Right May 2014   Family History  Problem Relation Age of Onset  . Heart disease Mother     MI in 22s  . Lymphoma Mother     related to asbestos  . Cancer Mother     lymphoma (asbestos exposure)  . Alcoholism Father   . Cirrhosis Father     due to alcohol  . Asthma Father   . Cancer Maternal Uncle     lung  . Cancer Maternal Uncle     lung   Social History   Social History  . Marital status: Divorced    Spouse name: N/A  . Number of children: 4  . Years of education: 134   Occupational History  . DISABLED Unemployed  . Previously a CNA    Social History Main Topics  . Smoking status: Never Smoker  . Smokeless tobacco: Never Used  . Alcohol use No  . Drug use: No  . Sexual activity: No   Other Topics Concern  . 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 Part time student due to learning disability. 4 kids.       On disability.       College education. Lives alone. No stairs.                  Review of systems: Constitutional:  She has no weight gain or weight loss. She has no fever or chills. Eyes: No blurred vision Ears, Nose, Mouth, Throat: No dizziness, headaches or changes in hearing. No mouth sores. Cardiovascular: No chest pain, palpitations or edema. Respiratory:  No shortness of breath, wheezing or cough Gastrointestinal: She has normal bowel movements without diarrhea or constipation. She denies any nausea or vomiting. She denies blood in her stool or heart burn. Genitourinary:  She denies pelvic pain, pelvic  pressure or changes in her urinary function. She has no hematuria, dysuria, or incontinence. She has no irregular vaginal bleeding or vaginal discharge Musculoskeletal: Denies muscle weakness or joint pains.  Skin:  She has no skin changes, rashes or itching Neurological:  Denies dizziness or headaches. No neuropathy, no numbness or tingling. Psychiatric:  She denies depression or anxiety. Hematologic/Lymphatic:   No easy bruising or bleeding   Physical Exam: Blood pressure 132/89, pulse 88, temperature 98.3 F (36.8 C), resp. rate 20, weight 248 lb (112.5 kg). General: Well dressed, well nourished in no apparent distress.   HEENT:  Normocephalic and atraumatic, no lesions.  Extraocular muscles intact. Sclerae anicteric. Pupils equal, round, reactive. No mouth sores or ulcers. Thyroid is normal size, not nodular, midline. Skin:  No lesions or rashes. Abdomen:  Soft, nontender, nondistended.  No palpable masses.  No hepatosplenomegaly.  No ascites. Normal bowel sounds.  No hernias.  Incisions are well healed. Genitourinary: Normal EGBUS  Vaginal cuff intact.  No bleeding or discharge.  No cul de sac fullness. Extremities: No cyanosis, clubbing or edema.  No calf  tenderness or erythema. No palpable cords. Psychiatric: Mood and affect are appropriate. Neurological: Awake, alert and oriented x 3. Sensation is intact, no neuropathy.  Musculoskeletal: No pain, normal strength and range of motion.  [name], MD

## 2017-03-28 ENCOUNTER — Encounter: Payer: Self-pay | Admitting: Gynecologic Oncology

## 2017-03-28 ENCOUNTER — Ambulatory Visit: Payer: Medicare Other | Attending: Gynecologic Oncology | Admitting: Gynecologic Oncology

## 2017-03-28 VITALS — BP 132/89 | HR 88 | Temp 98.3°F | Resp 20 | Wt 248.0 lb

## 2017-03-28 DIAGNOSIS — Z888 Allergy status to other drugs, medicaments and biological substances status: Secondary | ICD-10-CM | POA: Diagnosis not present

## 2017-03-28 DIAGNOSIS — K219 Gastro-esophageal reflux disease without esophagitis: Secondary | ICD-10-CM | POA: Diagnosis not present

## 2017-03-28 DIAGNOSIS — Z79899 Other long term (current) drug therapy: Secondary | ICD-10-CM | POA: Diagnosis not present

## 2017-03-28 DIAGNOSIS — Z8249 Family history of ischemic heart disease and other diseases of the circulatory system: Secondary | ICD-10-CM | POA: Insufficient documentation

## 2017-03-28 DIAGNOSIS — Z08 Encounter for follow-up examination after completed treatment for malignant neoplasm: Secondary | ICD-10-CM | POA: Diagnosis not present

## 2017-03-28 DIAGNOSIS — Z807 Family history of other malignant neoplasms of lymphoid, hematopoietic and related tissues: Secondary | ICD-10-CM | POA: Insufficient documentation

## 2017-03-28 DIAGNOSIS — Z8542 Personal history of malignant neoplasm of other parts of uterus: Secondary | ICD-10-CM

## 2017-03-28 DIAGNOSIS — Z90722 Acquired absence of ovaries, bilateral: Secondary | ICD-10-CM | POA: Insufficient documentation

## 2017-03-28 DIAGNOSIS — H409 Unspecified glaucoma: Secondary | ICD-10-CM | POA: Insufficient documentation

## 2017-03-28 DIAGNOSIS — Z6841 Body Mass Index (BMI) 40.0 and over, adult: Secondary | ICD-10-CM | POA: Insufficient documentation

## 2017-03-28 DIAGNOSIS — Z7982 Long term (current) use of aspirin: Secondary | ICD-10-CM | POA: Diagnosis not present

## 2017-03-28 DIAGNOSIS — J45909 Unspecified asthma, uncomplicated: Secondary | ICD-10-CM | POA: Diagnosis not present

## 2017-03-28 DIAGNOSIS — C541 Malignant neoplasm of endometrium: Secondary | ICD-10-CM

## 2017-03-28 DIAGNOSIS — Z90710 Acquired absence of both cervix and uterus: Secondary | ICD-10-CM | POA: Diagnosis not present

## 2017-03-28 DIAGNOSIS — Z885 Allergy status to narcotic agent status: Secondary | ICD-10-CM | POA: Diagnosis not present

## 2017-03-28 DIAGNOSIS — I10 Essential (primary) hypertension: Secondary | ICD-10-CM | POA: Diagnosis not present

## 2017-03-28 DIAGNOSIS — E785 Hyperlipidemia, unspecified: Secondary | ICD-10-CM | POA: Insufficient documentation

## 2017-03-28 NOTE — Patient Instructions (Signed)
Please return to see Dr Leo Grosser for follow-up in October, 2018. Please return to see Dr Denman George for follow-up in April, 2019 or sooner if symptoms, such as bleeding develop.  You should plan on colonoscopy in September, as scheduled.

## 2017-04-11 ENCOUNTER — Other Ambulatory Visit: Payer: Self-pay | Admitting: Family Medicine

## 2017-04-12 DIAGNOSIS — I10 Essential (primary) hypertension: Secondary | ICD-10-CM | POA: Diagnosis not present

## 2017-04-19 DIAGNOSIS — M25562 Pain in left knee: Secondary | ICD-10-CM | POA: Diagnosis not present

## 2017-05-04 DIAGNOSIS — M6752 Plica syndrome, left knee: Secondary | ICD-10-CM | POA: Diagnosis not present

## 2017-05-04 DIAGNOSIS — G8918 Other acute postprocedural pain: Secondary | ICD-10-CM | POA: Diagnosis not present

## 2017-05-04 DIAGNOSIS — M94262 Chondromalacia, left knee: Secondary | ICD-10-CM | POA: Diagnosis not present

## 2017-05-04 DIAGNOSIS — M23252 Derangement of posterior horn of lateral meniscus due to old tear or injury, left knee: Secondary | ICD-10-CM | POA: Diagnosis not present

## 2017-05-04 DIAGNOSIS — M23222 Derangement of posterior horn of medial meniscus due to old tear or injury, left knee: Secondary | ICD-10-CM | POA: Diagnosis not present

## 2017-05-11 ENCOUNTER — Other Ambulatory Visit: Payer: Self-pay | Admitting: Family Medicine

## 2017-05-11 DIAGNOSIS — I10 Essential (primary) hypertension: Secondary | ICD-10-CM | POA: Diagnosis not present

## 2017-05-12 DIAGNOSIS — M25562 Pain in left knee: Secondary | ICD-10-CM | POA: Diagnosis not present

## 2017-05-12 DIAGNOSIS — Z9889 Other specified postprocedural states: Secondary | ICD-10-CM | POA: Diagnosis not present

## 2017-05-12 DIAGNOSIS — M25662 Stiffness of left knee, not elsewhere classified: Secondary | ICD-10-CM | POA: Diagnosis not present

## 2017-05-17 DIAGNOSIS — M25662 Stiffness of left knee, not elsewhere classified: Secondary | ICD-10-CM | POA: Diagnosis not present

## 2017-05-17 DIAGNOSIS — M25562 Pain in left knee: Secondary | ICD-10-CM | POA: Diagnosis not present

## 2017-05-20 DIAGNOSIS — M25562 Pain in left knee: Secondary | ICD-10-CM | POA: Diagnosis not present

## 2017-05-20 DIAGNOSIS — M25662 Stiffness of left knee, not elsewhere classified: Secondary | ICD-10-CM | POA: Diagnosis not present

## 2017-05-24 DIAGNOSIS — M25562 Pain in left knee: Secondary | ICD-10-CM | POA: Diagnosis not present

## 2017-05-24 DIAGNOSIS — M25662 Stiffness of left knee, not elsewhere classified: Secondary | ICD-10-CM | POA: Diagnosis not present

## 2017-05-26 DIAGNOSIS — M25662 Stiffness of left knee, not elsewhere classified: Secondary | ICD-10-CM | POA: Diagnosis not present

## 2017-05-26 DIAGNOSIS — M25562 Pain in left knee: Secondary | ICD-10-CM | POA: Diagnosis not present

## 2017-06-02 DIAGNOSIS — M25562 Pain in left knee: Secondary | ICD-10-CM | POA: Diagnosis not present

## 2017-06-21 DIAGNOSIS — M19011 Primary osteoarthritis, right shoulder: Secondary | ICD-10-CM | POA: Diagnosis not present

## 2017-06-30 DIAGNOSIS — M25562 Pain in left knee: Secondary | ICD-10-CM | POA: Diagnosis not present

## 2017-07-08 ENCOUNTER — Encounter: Payer: Self-pay | Admitting: Family Medicine

## 2017-07-08 ENCOUNTER — Ambulatory Visit (INDEPENDENT_AMBULATORY_CARE_PROVIDER_SITE_OTHER): Payer: Medicare Other | Admitting: Family Medicine

## 2017-07-08 VITALS — BP 122/78 | HR 90 | Temp 98.2°F | Ht 67.0 in | Wt 236.6 lb

## 2017-07-08 DIAGNOSIS — R739 Hyperglycemia, unspecified: Secondary | ICD-10-CM | POA: Diagnosis not present

## 2017-07-08 DIAGNOSIS — I10 Essential (primary) hypertension: Secondary | ICD-10-CM

## 2017-07-08 LAB — POCT GLYCOSYLATED HEMOGLOBIN (HGB A1C): HEMOGLOBIN A1C: 6.2

## 2017-07-08 MED ORDER — ONDANSETRON HCL 4 MG PO TABS: ORAL_TABLET | ORAL | 0 refills | Status: DC

## 2017-07-08 MED ORDER — PHENTERMINE HCL 37.5 MG PO CAPS: 37.5000 mg | ORAL_CAPSULE | ORAL | 0 refills | Status: DC

## 2017-07-08 MED ORDER — MECLIZINE HCL 12.5 MG PO TABS
12.5000 mg | ORAL_TABLET | Freq: Three times a day (TID) | ORAL | 0 refills | Status: DC | PRN
Start: 2017-07-08 — End: 2018-10-10

## 2017-07-08 MED ORDER — TETANUS-DIPHTH-ACELL PERTUSSIS 5-2-15.5 LF-MCG/0.5 IM SUSP: 0.5000 mL | Freq: Once | INTRAMUSCULAR | 0 refills | Status: AC

## 2017-07-08 NOTE — Assessment & Plan Note (Signed)
Blood pressure at goal on 5 mg of lisinopril and 25 mg hydrochlorothiazide.  - Continue current regimen

## 2017-07-08 NOTE — Patient Instructions (Addendum)
Hiya, you were seen today for a checkup and I am glad to hear you are continuing with your weight loss and healthy lifestyle.  Your blood pressure looks great today, too!    I also severe prescription for TDAP immunization and information on getting a mammogram.   Very nice seeing you today!  Daniel L. Rosalyn Gess, [place] Medicine Resident PGY-2 07/08/2017 2:42 PM

## 2017-07-08 NOTE — Progress Notes (Signed)
    Subjective:  Roberta [name] is a 61 y.o. female who presents to the [facility] today for check up  HPI:  Hypertension BP Readings from Last 3 Encounters:  07/08/17 122/78  03/28/17 132/89  01/25/17 104/62   Home BP monitoring-not checking at home Compliant with medications-yes without side effects ROS-Denies any CP, HA, SOB, blurry vision, LE edema, transient weakness, orthopnea, PND.   PMH:  Tobacco use: never smoked Medication: reviewed and updated ROS: see HPI   Objective:  Physical Exam: BP 122/78   Pulse 90   Temp 98.2 F (36.8 C) (Oral)   Ht 5\' 7"  (1.702 m)   Wt 236 lb 9.6 oz (107.3 kg)   SpO2 94%   BMI 37.06 kg/m   Gen: 60yo F in NAD, resting comfortably CV: RRR with no murmurs appreciated Pulm: NWOB, CTAB with no crackles, wheezes, or rhonchi GI: Normal bowel sounds present. Soft, Nontender, Nondistended. MSK: no edema, cyanosis, or clubbing noted Skin: warm, dry Neuro: grossly normal, moves all extremities Psych: Normal affect and thought content  Results for orders placed or performed in visit on 07/08/17 (from the past 72 hour(s))  HgB A1c     Status: None   Collection Time: 07/08/17  1:59 PM  Result Value Ref Range   Hemoglobin A1C 6.2      Assessment/Plan:  HYPERTENSION, BENIGN SYSTEMIC Blood pressure at goal on 5 mg of lisinopril and 25 mg hydrochlorothiazide.  - Continue current regimen  Health maintenance: Patient contacted [facility] was previously done her colonoscopies and she was told that she is due for colonoscopy next year. Patient had a hemoglobin A1c checked today and this was 6.2. Had Pap smear done by Dr. Leo Grosser at [facility] and had HIV test done in 2014. Provide patient with a prescription in for tetanus/TDAP and paperwork to contact the breast center to get mammogram.

## 2017-07-12 DIAGNOSIS — R9431 Abnormal electrocardiogram [ECG] [EKG]: Secondary | ICD-10-CM | POA: Diagnosis not present

## 2017-07-12 DIAGNOSIS — I10 Essential (primary) hypertension: Secondary | ICD-10-CM | POA: Diagnosis not present

## 2017-07-28 DIAGNOSIS — Z1231 Encounter for screening mammogram for malignant neoplasm of breast: Secondary | ICD-10-CM | POA: Diagnosis not present

## 2017-07-29 DIAGNOSIS — M542 Cervicalgia: Secondary | ICD-10-CM | POA: Diagnosis not present

## 2017-08-12 ENCOUNTER — Other Ambulatory Visit: Payer: Self-pay | Admitting: Family Medicine

## 2017-08-12 NOTE — Telephone Encounter (Signed)
Patient requesting 30-day supple on zofran.

## 2017-08-15 MED ORDER — HYDROCHLOROTHIAZIDE 25 MG PO TABS: 25.0000 mg | ORAL_TABLET | Freq: Every day | ORAL | 0 refills | Status: DC

## 2017-08-15 MED ORDER — LISINOPRIL 5 MG PO TABS: 5.0000 mg | ORAL_TABLET | Freq: Every day | ORAL | 0 refills | Status: DC

## 2017-09-13 DIAGNOSIS — I10 Essential (primary) hypertension: Secondary | ICD-10-CM | POA: Diagnosis not present

## 2017-09-26 DIAGNOSIS — R319 Hematuria, unspecified: Secondary | ICD-10-CM | POA: Diagnosis not present

## 2017-09-26 DIAGNOSIS — R3915 Urgency of urination: Secondary | ICD-10-CM | POA: Diagnosis not present

## 2017-09-26 DIAGNOSIS — Z01411 Encounter for gynecological examination (general) (routine) with abnormal findings: Secondary | ICD-10-CM | POA: Diagnosis not present

## 2017-09-26 DIAGNOSIS — C541 Malignant neoplasm of endometrium: Secondary | ICD-10-CM | POA: Diagnosis not present

## 2017-09-28 ENCOUNTER — Encounter: Payer: Self-pay | Admitting: Internal Medicine

## 2017-09-28 ENCOUNTER — Ambulatory Visit (INDEPENDENT_AMBULATORY_CARE_PROVIDER_SITE_OTHER): Payer: Medicare Other | Admitting: Internal Medicine

## 2017-09-28 VITALS — BP 120/78 | HR 100 | Temp 98.7°F | Ht 67.0 in | Wt 230.8 lb

## 2017-09-28 DIAGNOSIS — J45909 Unspecified asthma, uncomplicated: Secondary | ICD-10-CM | POA: Diagnosis not present

## 2017-09-28 DIAGNOSIS — R062 Wheezing: Secondary | ICD-10-CM

## 2017-09-28 MED ORDER — PREDNISONE 20 MG PO TABS: 40.0000 mg | ORAL_TABLET | Freq: Every day | ORAL | 0 refills | Status: DC

## 2017-09-28 MED ORDER — IPRATROPIUM BROMIDE 0.02 % IN SOLN
0.5000 mg | Freq: Once | RESPIRATORY_TRACT | Status: AC
  Administered 2017-09-28: 0.5 mg via RESPIRATORY_TRACT

## 2017-09-28 MED ORDER — CETIRIZINE HCL 10 MG PO TABS: 10.0000 mg | ORAL_TABLET | Freq: Every day | ORAL | 2 refills | Status: DC

## 2017-09-28 MED ORDER — ALBUTEROL SULFATE (2.5 MG/3ML) 0.083% IN NEBU
2.5000 mg | INHALATION_SOLUTION | Freq: Once | RESPIRATORY_TRACT | Status: AC
  Administered 2017-09-28: 2.5 mg via RESPIRATORY_TRACT

## 2017-09-28 MED ORDER — ALBUTEROL SULFATE HFA 108 (90 BASE) MCG/ACT IN AERS: 2.0000 | INHALATION_SPRAY | RESPIRATORY_TRACT | 11 refills | Status: DC | PRN

## 2017-09-28 NOTE — Patient Instructions (Addendum)
Ms. Asher,  I think you are having an asthma exacerbation. Please take prednisone 40 mg with breakfast for the next 5 days. If you feel jittery, stop phentermine until you finish steroids. Take albuterol as needed for wheezing.   Zyrtec and saline nasal spray can help with general allergy symptoms and nasal congestion.   When you see Dr. Rosalyn Gess back, if you did not have a urinary tract infection, we should get another urine sample.   Best, Dr. Ola Spurr

## 2017-09-28 NOTE — Progress Notes (Signed)
[facility] Progress Note  Subjective:  Roberta [name] is a 61 y.o. female with history of intermittent asthma, HLD, HTN, GERD, obesity, and chronic back pain who presents for difficulty breathing. Patient has noticed shortness of breath and increased wheezing "since getting caught outside in the rain" yesterday. She has been needing to use her albuterol inhaler more (~3 times a day). She also notes itchy eyes and nasal congestion. She is unaware of sick contacts. Has had some dry cough. ROS: Does not think she has had fever or chills.  Of note, patient reports recent hematuria seen on UA at gynecologist's office. Says she is awaiting urine culture results. Denies current dysuria or frequency.   Social: Never smoker  Allergies  Allergen Reactions  . Influenza Vaccines Anaphylaxis    Throat swelling reported after flu shot in the past (cannot verify with records but would not give)  . Budesonide Other (See Comments)    Throat infection per patient  . Prednisone Other (See Comments)    increased vaginal bleeding, able to tolerate low dose (40 mg)  . Flonase [Fluticasone Propionate] Other (See Comments)    Upper respitory  issues  . Codeine Nausea And Vomiting  . Morphine And Related Nausea And Vomiting  . Nickel Rash    Objective: Blood pressure 120/78, pulse 100, temperature 98.7 F (37.1 C), temperature source Oral, height 5\' 7"  (1.702 m), weight 230 lb 12.8 oz (104.7 kg), SpO2 96 %. Body mass index is 36.15 kg/m. Constitutional: Pleasant, obese female in NAD HENT: MMM, erythema and swelling of nasal turbinates. No sinus tenderness to percussion.  Cardiovascular: RRR, S1, S2, no m/r/g.  Pulmonary/Chest: Diffuse expiratory wheezes. No increased WOB, speaks in complete sentences with ease.  Abdominal: Soft. +BS, NT, ND Skin: Skin is warm and dry. No rash noted.    Vitals reviewed  Assessment/Plan: Wheezing - Patient with history of asthma and diffuse wheezing  that improved with duoneb treatment in the office. Suspect asthma exacerbation in setting of URI vs allergic rhinitis. Low suspicion for pneumonia without fever, hypoxemia or focal lung findings. - Prescribed 5 days course of prednisone. Recommend patient stop her phentermine that she is taking for weight loss if she feels jittery.  - Suggested zyrtec and nasal saline for seasonal allergies.  - Albuterol prn.   Follow-up next week with PCP as previously scheduled.  Olene Floss, MD [place], PGY-3

## 2017-09-30 DIAGNOSIS — H401133 Primary open-angle glaucoma, bilateral, severe stage: Secondary | ICD-10-CM | POA: Diagnosis not present

## 2017-09-30 DIAGNOSIS — R062 Wheezing: Secondary | ICD-10-CM | POA: Insufficient documentation

## 2017-09-30 NOTE — Assessment & Plan Note (Signed)
-   Patient with history of asthma and diffuse wheezing that improved with duoneb treatment in the office. Suspect asthma exacerbation in setting of URI vs allergic rhinitis. Low suspicion for pneumonia without fever, hypoxemia or focal lung findings. - Prescribed 5 days course of prednisone. Recommend patient stop her phentermine that she is taking for weight loss if she feels jittery.  - Suggested zyrtec and nasal saline for seasonal allergies.  - Albuterol prn.

## 2017-10-05 ENCOUNTER — Ambulatory Visit (INDEPENDENT_AMBULATORY_CARE_PROVIDER_SITE_OTHER): Payer: Medicare Other | Admitting: Family Medicine

## 2017-10-05 ENCOUNTER — Encounter: Payer: Self-pay | Admitting: Family Medicine

## 2017-10-05 VITALS — BP 108/74 | HR 85 | Temp 97.9°F | Ht 67.0 in | Wt 232.2 lb

## 2017-10-05 DIAGNOSIS — E785 Hyperlipidemia, unspecified: Secondary | ICD-10-CM

## 2017-10-05 DIAGNOSIS — R7989 Other specified abnormal findings of blood chemistry: Secondary | ICD-10-CM

## 2017-10-05 DIAGNOSIS — R319 Hematuria, unspecified: Secondary | ICD-10-CM

## 2017-10-05 DIAGNOSIS — R739 Hyperglycemia, unspecified: Secondary | ICD-10-CM | POA: Diagnosis not present

## 2017-10-05 LAB — POCT URINALYSIS DIP (MANUAL ENTRY)
BILIRUBIN UA: NEGATIVE mg/dL
Bilirubin, UA: NEGATIVE
GLUCOSE UA: NEGATIVE mg/dL
Leukocytes, UA: NEGATIVE
Nitrite, UA: NEGATIVE
Protein Ur, POC: NEGATIVE mg/dL
SPEC GRAV UA: 1.02 (ref 1.010–1.025)
Urobilinogen, UA: 0.2 E.U./dL
pH, UA: 5.5 (ref 5.0–8.0)

## 2017-10-05 LAB — POCT UA - MICROSCOPIC ONLY

## 2017-10-05 LAB — POCT GLYCOSYLATED HEMOGLOBIN (HGB A1C): Hemoglobin A1C: 6

## 2017-10-05 MED ORDER — DORZOLAMIDE HCL-TIMOLOL MAL 2-0.5 % OP SOLN: 1.0000 [drp] | Freq: Two times a day (BID) | OPHTHALMIC | 12 refills | Status: AC

## 2017-10-05 MED ORDER — RANITIDINE HCL 150 MG PO TABS: 150.0000 mg | ORAL_TABLET | Freq: Two times a day (BID) | ORAL | 4 refills | Status: DC

## 2017-10-05 NOTE — Progress Notes (Signed)
    Subjective:   HPI:  61 y.o. year old female presents today for check up  Acute Concerns:  Nexium may cause kidney disease seen on news and she had some incidental blood in her urine. I do not have the labs available to me.   Diet: Improved with raw vegetables  Exercise: Walking 2x per week and wants to move up to daily  Sexual/Birth History:  No sexual activity  Birth Control: none  POA/Living Will:  Yes  Social:  Social History   Social History  . Marital status: Divorced    Spouse name: N/A  . Number of children: 4  . Years of education: 134   Occupational History  . DISABLED Unemployed  . Previously a CNA    Social History Main Topics  . Smoking status: Never Smoker  . Smokeless tobacco: Never Used  . Alcohol use No  . Drug use: No  . Sexual activity: No   Other Topics Concern  . 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 Part time student due to learning disability. 4 kids.       On disability.       College education. Lives alone. No stairs.                 Immunization: Immunization History  Administered Date(s) Administered  . Influenza Whole 10/30/2007, 10/02/2008, 10/05/2010  . Td 12/20/2000  . Tdap 09/19/2017    Cancer Screening:  Pap Smear: UTD  Mammogram: UTD  Colonoscopy: Will be in January done [facility]  Dexa: NA   Objective:  Physical Exam: BP 108/74   Pulse 85   Temp 97.9 F (36.6 C) (Oral)   Ht 5\' 7"  (1.702 m)   Wt 232 lb 3.2 oz (105.3 kg)   SpO2 93%   BMI 36.37 kg/m   Physical Exam: VITALS: Reviewed GEN: Pleasant female, NAD HEENT: AT/[place], EOMI, no scleral icterus, MMM CARDIAC:RRR, S1 and S2 present, no murmur RESP: CTAB, normal effort ABD: soft, no tenderness, normal bowel sounds EXT: No edema, 2+ radial and DP pulses  SKIN: no rash  No results found for this or any previous visit (from the  past 72 hour(s)).   Assessment/Plan:  Hyperlipidemia History of, but not currently on any statin therapy. Last LDL checked in 2015. Rechecked lipid panel today. ASCVD risk >5.5, recommendation moderate intensity statin. Will call patient to discuss results and recommendations - will initiate lipitor 40mg  daily if patient agrees - recheck LDL in 28mo  Health maintenance Tab reflects past due health maintenance, but patient presented me with paperwork showing completed PAP and mammogram from [facility] office. Both of which are normal. Paperwork to be uploaded. Received TDAP. Colonoscopy scheduled for January with [facility]  GERD Patient concerned about health risk effects from nexium that she heard from the news. Will stop patient per her request. Will start zantac 150mg   Carter Kassel L. Rosalyn Gess, [place] Medicine Resident PGY-2 10/09/2017 3:43 PM

## 2017-10-05 NOTE — Patient Instructions (Signed)
Roberta [name], you were seen today for a check up. You are up to date with all of your healthcare maintenance items aside form a colonoscopy. I will look out for this result in the new year.   I am rechecking your urine for blood.  I will call you with these results.   I am also checking your kidney levels and cholesterol.    It was very nice to see you today.  Daniel L. Rosalyn Gess, [place] Medicine Resident PGY-2 10/05/2017 2:18 PM

## 2017-10-06 LAB — LIPID PANEL
CHOL/HDL RATIO: 4.4 ratio (ref 0.0–4.4)
CHOLESTEROL TOTAL: 186 mg/dL (ref 100–199)
HDL: 42 mg/dL (ref >39)
LDL CALC: 126 mg/dL — AB (ref 0–99)
TRIGLYCERIDES: 91 mg/dL (ref 0–149)
VLDL CHOLESTEROL CAL: 18 mg/dL (ref 5–40)

## 2017-10-06 LAB — CMP14+EGFR
ALK PHOS: 89 IU/L (ref 39–117)
ALT: 12 IU/L (ref 0–32)
AST: 15 IU/L (ref 0–40)
Albumin/Globulin Ratio: 1.3 (ref 1.2–2.2)
Albumin: 4.1 g/dL (ref 3.6–4.8)
BUN/Creatinine Ratio: 18 (ref 12–28)
BUN: 18 mg/dL (ref 8–27)
Bilirubin Total: 0.3 mg/dL (ref 0.0–1.2)
CALCIUM: 9.8 mg/dL (ref 8.7–10.3)
CO2: 26 mmol/L (ref 20–29)
CREATININE: 1.02 mg/dL — AB (ref 0.57–1.00)
Chloride: 97 mmol/L (ref 96–106)
GFR calc Af Amer: 68 mL/min/{1.73_m2} (ref >59)
GFR, EST NON AFRICAN AMERICAN: 59 mL/min/{1.73_m2} — AB (ref >59)
GLOBULIN, TOTAL: 3.2 g/dL (ref 1.5–4.5)
GLUCOSE: 100 mg/dL — AB (ref 65–99)
Potassium: 4.1 mmol/L (ref 3.5–5.2)
Sodium: 138 mmol/L (ref 134–144)
Total Protein: 7.3 g/dL (ref 6.0–8.5)

## 2017-10-10 ENCOUNTER — Other Ambulatory Visit: Payer: Self-pay | Admitting: Family Medicine

## 2017-10-10 DIAGNOSIS — H40013 Open angle with borderline findings, low risk, bilateral: Secondary | ICD-10-CM | POA: Diagnosis not present

## 2017-10-10 MED ORDER — ATORVASTATIN CALCIUM 40 MG PO TABS: 40.0000 mg | ORAL_TABLET | Freq: Every day | ORAL | 3 refills | Status: DC

## 2017-10-11 DIAGNOSIS — I10 Essential (primary) hypertension: Secondary | ICD-10-CM | POA: Diagnosis not present

## 2017-10-11 DIAGNOSIS — M25511 Pain in right shoulder: Secondary | ICD-10-CM | POA: Diagnosis not present

## 2017-10-12 DIAGNOSIS — M19011 Primary osteoarthritis, right shoulder: Secondary | ICD-10-CM | POA: Diagnosis not present

## 2017-10-15 ENCOUNTER — Encounter: Payer: Self-pay | Admitting: Family Medicine

## 2017-10-17 ENCOUNTER — Telehealth: Payer: Self-pay | Admitting: Family Medicine

## 2017-10-17 NOTE — Telephone Encounter (Signed)
Pt read the side effects of lipitor was light colored stools. She has noticed hers are very light. She is concerned. She can be reached by her cell phone today but tomorrow please call her house phone

## 2017-10-20 NOTE — Telephone Encounter (Signed)
I called her and left a message and discussed return precautions and recommended follow up next week.   FYI white team  Josefina Rynders L. Rosalyn Gess, [place] Medicine Resident PGY-2 10/20/2017 2:33 PM

## 2017-10-20 NOTE — Telephone Encounter (Signed)
I would recommend that she stop this medication and follow up with me.   Please call the patient.   Rei Contee L. Rosalyn Gess, [place] Medicine Resident PGY-2 10/20/2017 2:29 PM

## 2017-10-24 ENCOUNTER — Ambulatory Visit (INDEPENDENT_AMBULATORY_CARE_PROVIDER_SITE_OTHER): Payer: Medicare Other | Admitting: Student

## 2017-10-24 ENCOUNTER — Other Ambulatory Visit: Payer: Self-pay | Admitting: Student

## 2017-10-24 ENCOUNTER — Encounter: Payer: Self-pay | Admitting: Student

## 2017-10-24 VITALS — BP 118/80 | HR 82 | Temp 97.6°F | Ht 67.0 in | Wt 230.4 lb

## 2017-10-24 DIAGNOSIS — R300 Dysuria: Secondary | ICD-10-CM

## 2017-10-24 DIAGNOSIS — R319 Hematuria, unspecified: Secondary | ICD-10-CM | POA: Diagnosis not present

## 2017-10-24 DIAGNOSIS — M25512 Pain in left shoulder: Secondary | ICD-10-CM

## 2017-10-24 DIAGNOSIS — T466X5A Adverse effect of antihyperlipidemic and antiarteriosclerotic drugs, initial encounter: Secondary | ICD-10-CM

## 2017-10-24 LAB — POCT URINALYSIS DIP (MANUAL ENTRY)
BILIRUBIN UA: NEGATIVE mg/dL
Bilirubin, UA: NEGATIVE
Glucose, UA: NEGATIVE mg/dL
Leukocytes, UA: NEGATIVE
Nitrite, UA: NEGATIVE
PH UA: 5.5 (ref 5.0–8.0)
PROTEIN UA: NEGATIVE mg/dL
Urobilinogen, UA: 0.2 E.U./dL

## 2017-10-24 LAB — POCT UA - MICROSCOPIC ONLY

## 2017-10-24 MED ORDER — MELOXICAM 15 MG PO TABS: 15.0000 mg | ORAL_TABLET | Freq: Every day | ORAL | 0 refills | Status: DC

## 2017-10-24 NOTE — Patient Instructions (Addendum)
It was great seeing you today! We have addressed the following issues today  About your Lipitor: Congratulations on losing weight.  Your risk for stroke or major heart attack is 6.5% in the next 10 years.  This does not meet the criteria to take Lipitor since he does not have diabetes.  So you can stop taking the Lipitor and focus on lifestyle change including diet and exercise.   Right shoulder pain: I am worried that she might of injured one of the tendons in the shoulder.  I recommend calling the orthopedics office for follow-up on this.  Meanwhile, take the prescription we gave you  Blood in urine: I recommend coming back to the clinic to have another urine test.  Your primary care doctor might refer you to urologist for this.   If we did any lab work today, and the results require attention, either me or my nurse will get in touch with you. If everything is normal, you will get a letter in mail and a message via . If you don't hear from [place] in two weeks, please give [place] a call. Otherwise, we look forward to seeing you again at your next visit. If you have any questions or concerns before then, please call the clinic at [phone].  Please bring all your medications to every doctors visit  Sign up for My Chart to have easy access to your labs results, and communication with your Primary care physician.    Please check-out at the front desk before leaving the clinic.    Take Care,   Dr. Cyndia Skeeters

## 2017-10-24 NOTE — Progress Notes (Signed)
Subjective:    Roberta [name] is a 61 y.o. old female here to discuss about Lipitor  HPI Lipitor: started Lipitor two weeks ago. Noted pale stool and weakness in the right arm a week later.  She lifted storage bins yesterday and few days ago but she had weakness in left arm before that. Symptoms (pale stool and right arm weakness) have resolved after she stopped taking Lipitor about a week ago. She has history of OA in left shoulder.She also reports intermittent left flank pain and right back pain. Denies dysuria but she says she is aware of it which is new for her. No increased freq of urination from baseline. Denies fever, N/V/D. Reports slight vaginal discharge but at baseline.  Denies vaginal bleeding  PMH/Problem List: has HYPERLIPIDEMIA; OBESITY, NOS; ANEMIA, OTHER, UNSPECIFIED; HYPERTENSION, BENIGN SYSTEMIC; ALLERGIC RHINITIS; Asthma; BREAST MASS, BENIGN; BACK PAIN, CHRONIC; GERD (gastroesophageal reflux disease); Gastric polyp; Glaucoma; Cervical disc disorder with radiculopathy of cervical region; Uterine fibroid; Routine health maintenance; Benign paroxysmal positional vertigo; Right foot pain; Abnormal EKG; Postmenopausal bleeding; Atypical endometrial hyperplasia; Endometrial cancer ([place]); Pineal gland cyst; Exophthalmos; Vaginal discharge; and Wheezing on their problem list.   has a past medical history of Abnormal EKG, ALLERGIC RHINITIS (10/14/2010), Anemia, Asthma, ASTHMA, INTERMITTENT (02/16/2007), BACK PAIN, CHRONIC (03/25/2009), CERVICAL RADICULOPATHY (10/16/2009), DDD (degenerative disc disease), lumbosacral, Depression, DEPRESSIVE DISORDER NOT ELSEWHERE CLASSIFIED (03/25/2009), Endometrial cancer ([place]), GERD (gastroesophageal reflux disease) (09/09/2011), Glaucoma, Hyperlipidemia, Hypertension, PONV (postoperative nausea and vomiting), and Postmenopausal vaginal bleeding (09/24/2014).  FH:  Family History  Problem Relation Age of Onset  . Heart disease Mother        MI in 38s  . Lymphoma  Mother        related to asbestos  . Cancer Mother        lymphoma (asbestos exposure)  . Alcoholism Father   . Cirrhosis Father        due to alcohol  . Asthma Father   . Cancer Maternal Uncle        lung  . Cancer Maternal Uncle        lung    SH Social History   Tobacco Use  . Smoking status: Never Smoker  . Smokeless tobacco: Never Used  Substance Use Topics  . Alcohol use: No    Alcohol/week: 0.0 oz  . Drug use: No    Review of Systems Review of systems negative except for pertinent positives and negatives in history of present illness above.     Objective:     Vitals:   10/24/17 1413  BP: 118/80  Pulse: 82  Temp: 97.6 F (36.4 C)  TempSrc: Oral  SpO2: 97%  Weight: 230 lb 6.4 oz (104.5 kg)  Height: 5\' 7"  (1.702 m)   Body mass index is 36.09 kg/m.  Physical Exam GENERAL: appears well, no ditress HEENT: PERRLA, MMM LUNGS:  No IWOB, good air movement, CTAB HEART:  RRR with no M/R/G ABD:  Morbidly obese, soft, NT with active BS MSK:   Shoulder: Inspection reveals no abnormalities, atrophy or asymmetry. Palpation elicits tenderness over the right shoulder anteriorly ROM is full in all planes but with pain in the right shoulder in all directions Rotator cuff strength normal but with pain Speeds and Yergason's tests elicits strong pain.  No Popeye sign Neuro: C5-T1 intact but otor strength limited by pain right arm CVS: brachialis, radial and ulnar pulses intact. PSYCH: normal affect Assessment and Plan:  1. Adverse effect of statin: patient concern about statin  adverse effects after pale looking stool right arm weakness.  Unsure if this is due to statin.  The only evidence at this time is temporal association.  Regardless, patient with ASCVD risk score of 6.5%.  LDL less than 190.  She has no history of diabetes.  Per AHA guideline, lifestyle change including diet exercise could be sufficient.  She is already working on this and lost about 30 pounds in the  last 1 year.  I congratulated her and encouraged her to continue.  We stopped statin today  2. Left shoulder pain, unspecified chronicity: concerning for biceps tendinitis.  She also have underlying OA.  I recommended calling the orthopedics office.  She is followed at [facility].  Meanwhile, she can try meloxicam.  I gave her a prescription for this  3. Hematuria, unspecified type: Patient with intermittent left flank pain and right buttock pain.  Urinalysis with hematuria but no leukocytes or nitrites.  But I wonder if she is passing renal stones.  She has no constitutional symptoms to think of malignancy but her age is a significant risk factor.  Encouraged her adequate fluid intake to 64 ounces of water a day.  Follow-up with PCP for another UA.  She may benefit from urology referral if she continues to have hematuria.  Return if symptoms worsen or fail to improve.  Mercy Riding, MD 10/24/17 Pager: [phone]

## 2017-10-26 ENCOUNTER — Telehealth: Payer: Self-pay | Admitting: *Deleted

## 2017-10-26 NOTE — Telephone Encounter (Signed)
Patient states she had follow-up appt on Monday and still has blood in urine.  States she does not want to come back for another follow-up appt and is requesting urology referral because she is "very concerned".  Will route referral request to PCP and call patient back with status.  Burna Forts, BSN, RN-BC

## 2017-10-27 ENCOUNTER — Other Ambulatory Visit: Payer: Self-pay | Admitting: Family Medicine

## 2017-10-27 DIAGNOSIS — R319 Hematuria, unspecified: Secondary | ICD-10-CM

## 2017-10-27 NOTE — Telephone Encounter (Signed)
Placed referral. Patient should be contacted regarding appointment at some point.   White team FYI in case patient calls again.   Judson Tsan L. Rosalyn Gess, [place] [facility] Resident PGY-2 10/27/2017 8:03 AM

## 2017-10-28 ENCOUNTER — Encounter: Payer: Self-pay | Admitting: Family Medicine

## 2017-10-31 DIAGNOSIS — H401133 Primary open-angle glaucoma, bilateral, severe stage: Secondary | ICD-10-CM | POA: Diagnosis not present

## 2017-11-03 ENCOUNTER — Other Ambulatory Visit: Payer: Self-pay | Admitting: Family Medicine

## 2017-11-04 MED ORDER — LISINOPRIL 5 MG PO TABS: 5.0000 mg | ORAL_TABLET | Freq: Every day | ORAL | 0 refills | Status: DC

## 2017-11-07 ENCOUNTER — Ambulatory Visit (HOSPITAL_COMMUNITY)
Admission: RE | Admit: 2017-11-07 | Disposition: A | Discharge status: Home / Self Care | Payer: Medicare Other | Source: Ambulatory Visit | Attending: Cardiology | Admitting: Cardiology

## 2017-11-07 SURGERY — ICD REVISION

## 2017-11-07 MED ORDER — MUPIROCIN 2 % EX OINT
1.0000 "application " | TOPICAL_OINTMENT | Freq: Once | CUTANEOUS | Status: DC
  Filled 2017-11-07: qty 22

## 2017-11-07 MED ORDER — MUPIROCIN 2 % EX OINT
TOPICAL_OINTMENT | CUTANEOUS | Status: AC
  Filled 2017-11-07: qty 22

## 2017-11-09 DIAGNOSIS — M75111 Incomplete rotator cuff tear or rupture of right shoulder, not specified as traumatic: Secondary | ICD-10-CM | POA: Diagnosis not present

## 2017-11-16 DIAGNOSIS — M25511 Pain in right shoulder: Secondary | ICD-10-CM | POA: Diagnosis not present

## 2017-11-17 DIAGNOSIS — R3121 Asymptomatic microscopic hematuria: Secondary | ICD-10-CM | POA: Diagnosis not present

## 2017-11-18 DIAGNOSIS — I10 Essential (primary) hypertension: Secondary | ICD-10-CM | POA: Diagnosis not present

## 2017-11-23 DIAGNOSIS — M75111 Incomplete rotator cuff tear or rupture of right shoulder, not specified as traumatic: Secondary | ICD-10-CM | POA: Diagnosis not present

## 2017-12-05 ENCOUNTER — Encounter: Payer: Self-pay | Admitting: Family Medicine

## 2017-12-06 DIAGNOSIS — R3121 Asymptomatic microscopic hematuria: Secondary | ICD-10-CM | POA: Diagnosis not present

## 2017-12-06 DIAGNOSIS — R3129 Other microscopic hematuria: Secondary | ICD-10-CM | POA: Diagnosis not present

## 2017-12-09 ENCOUNTER — Encounter: Payer: Self-pay | Admitting: Family Medicine

## 2017-12-15 DIAGNOSIS — R3121 Asymptomatic microscopic hematuria: Secondary | ICD-10-CM | POA: Diagnosis not present

## 2017-12-16 ENCOUNTER — Telehealth: Payer: Self-pay | Admitting: Gynecologic Oncology

## 2017-12-16 ENCOUNTER — Telehealth: Payer: Self-pay | Admitting: Family Medicine

## 2017-12-16 NOTE — Telephone Encounter (Signed)
Pt wants to know if she needs to be on blood thinners or not after her test results from when they scanned her bladder. Pt is very concerned and said she didn't feel like her urologist was answering her questions very good. She would like [facility] to call her and discuess these issues. She has an appointment next Thursday, the 3rd, but is so concerned about her health until then since she was left with so many unanswered questions. Please advise

## 2017-12-16 NOTE — Telephone Encounter (Signed)
Returned call to patient.  Spoke with her about the finding of right gonadal vein thrombus on CT done for Dr. Gloriann Loan with [facility].  Dr. Denman George is aware and no intervention necessary.  Reassured patient that a gonadal vein thrombus can be seen after having a hysterectomy with removal of tubes and ovaries.  Advised to call for any needs or concerns.  Per Dr. Denman George, it is not medically necessary to treat asymptomatic gonadal vein thrombus.  Blood Adv. 2017 Jun 27; 1(15): 1779-3903.  Published online 2017 Jun 22. doi: 10.1182/bloodadvances.0092330076

## 2017-12-22 ENCOUNTER — Encounter: Payer: Self-pay | Admitting: Family Medicine

## 2017-12-22 ENCOUNTER — Other Ambulatory Visit: Payer: Self-pay

## 2017-12-22 ENCOUNTER — Ambulatory Visit (INDEPENDENT_AMBULATORY_CARE_PROVIDER_SITE_OTHER): Payer: Medicare Other | Admitting: Family Medicine

## 2017-12-22 VITALS — BP 122/85 | HR 77 | Temp 97.8°F | Wt 235.0 lb

## 2017-12-22 DIAGNOSIS — R9389 Abnormal findings on diagnostic imaging of other specified body structures: Secondary | ICD-10-CM | POA: Diagnosis not present

## 2017-12-22 MED ORDER — ONDANSETRON HCL 4 MG PO TABS: ORAL_TABLET | ORAL | 0 refills | Status: DC

## 2017-12-22 MED ORDER — HYDROCHLOROTHIAZIDE 25 MG PO TABS: 25.0000 mg | ORAL_TABLET | Freq: Every day | ORAL | 0 refills | Status: DC

## 2017-12-22 NOTE — Patient Instructions (Addendum)
Roberta [name], your seen today to discuss results of your CT scan.  On the imaging there a  right gonadal vein thrombus. Dr. Denman George is aware of this finding and reports that there is no intervention necessary at this time.  Please follow-up with me in 3 months and we can track your weight loss progress.  I think you are making a good decision on not continuing with phentermine.   Very nice to see you today.  Gionni Vaca L. Rosalyn Gess, [place] [facility] Resident PGY-2 12/22/2017 2:32 PM

## 2017-12-22 NOTE — Progress Notes (Signed)
    Subjective:  Roberta [name] is a 62 y.o. female who presents to the [facility] today to discuss test results  HPI:  Roberta [name] was referred to urology by me for repeat UAs with hematuria.  They performed a CT scan that showed no abnormalities aside from a right gonadal vein thrombosis.  Patient's OB/GYN Dr. Denman George is reportedly aware of this finding and there is a note by her NP that states she has no plans for intervention at this time.  Patient is a symptomatically denies any right-sided pelvic pain or abdominal pain.  Patient presents today because she wants to discuss this finding and wants reassurance that this is not something to be concerned about.  I discussed with her that I would be deferring to Dr. Denman George for this determination and I am comforted by the fact that she is not concerned about this clot and it is likely related to her prior hysterectomy with oophorectomy.  She also noted that is not medically necessary to treat asymptomatic gonadal vein thrombosis.   PMH: Hypertension, endometrial cancer,  Tobacco use: Non-smoker Medication: reviewed and updated ROS: see HPI   Objective:  Physical Exam: BP 122/85   Pulse 77   Temp 97.8 F (36.6 C) (Oral)   Wt 235 lb (106.6 kg)   SpO2 98%   BMI 37.93 kg/m   Gen: 62 year old female in NAD, resting comfortably CV: RRR with no murmurs appreciated Pulm: NWOB, CTAB with no crackles, wheezes, or rhonchi GI: Normal bowel sounds present. Soft, Nontender, Nondistended. MSK: no edema, cyanosis, or clubbing noted Skin: warm, dry Neuro: grossly normal, moves all extremities Psych: Normal affect and thought content  No results found for this or any previous visit (from the past 72 hour(s)).   Assessment/Plan:  Asymptomatic right gonadal vein thrombosis Noted on CT scan of abdomen and pelvis for workup of asymptomatic hematuria by urologist.  Patient's gynecologist Dr. Denman George notes that treatment of asymptomatic gonadal vein thrombosis is  not medically necessary.  Patient understands and appreciates examination.  We will continue to monitor and discussed return precautions.  Health maintenance Colonoscopy in a few months. Per patient will be contacted by GI office.  Otherwise up-to-date on health maintenance items.  We will follow-up in 3 months per patient request.  Quillian Quince L. Rosalyn Gess, [place] Resident PGY-2 12/22/2017 2:42 PM

## 2017-12-28 ENCOUNTER — Encounter: Payer: Self-pay | Admitting: Family Medicine

## 2017-12-29 DIAGNOSIS — M79672 Pain in left foot: Secondary | ICD-10-CM | POA: Diagnosis not present

## 2017-12-29 DIAGNOSIS — H40013 Open angle with borderline findings, low risk, bilateral: Secondary | ICD-10-CM | POA: Diagnosis not present

## 2017-12-29 DIAGNOSIS — H524 Presbyopia: Secondary | ICD-10-CM | POA: Diagnosis not present

## 2018-01-10 DIAGNOSIS — M7501 Adhesive capsulitis of right shoulder: Secondary | ICD-10-CM | POA: Diagnosis not present

## 2018-01-10 DIAGNOSIS — G8918 Other acute postprocedural pain: Secondary | ICD-10-CM | POA: Diagnosis not present

## 2018-01-10 DIAGNOSIS — M75111 Incomplete rotator cuff tear or rupture of right shoulder, not specified as traumatic: Secondary | ICD-10-CM | POA: Diagnosis not present

## 2018-01-10 DIAGNOSIS — M7541 Impingement syndrome of right shoulder: Secondary | ICD-10-CM | POA: Diagnosis not present

## 2018-01-10 DIAGNOSIS — M94211 Chondromalacia, right shoulder: Secondary | ICD-10-CM | POA: Diagnosis not present

## 2018-01-16 DIAGNOSIS — Z9889 Other specified postprocedural states: Secondary | ICD-10-CM | POA: Diagnosis not present

## 2018-01-20 ENCOUNTER — Encounter: Payer: Self-pay | Admitting: Family Medicine

## 2018-01-24 ENCOUNTER — Other Ambulatory Visit: Payer: Self-pay | Admitting: Family Medicine

## 2018-01-24 DIAGNOSIS — N029 Recurrent and persistent hematuria with unspecified morphologic changes: Secondary | ICD-10-CM

## 2018-01-26 ENCOUNTER — Other Ambulatory Visit: Payer: Self-pay | Admitting: Family Medicine

## 2018-02-20 DIAGNOSIS — Z9889 Other specified postprocedural states: Secondary | ICD-10-CM | POA: Diagnosis not present

## 2018-03-22 ENCOUNTER — Other Ambulatory Visit: Payer: Self-pay

## 2018-03-22 MED ORDER — HYDROCHLOROTHIAZIDE 25 MG PO TABS: 25.0000 mg | ORAL_TABLET | Freq: Every day | ORAL | 0 refills | Status: DC

## 2018-03-30 DIAGNOSIS — H40013 Open angle with borderline findings, low risk, bilateral: Secondary | ICD-10-CM | POA: Diagnosis not present

## 2018-04-03 ENCOUNTER — Telehealth: Payer: Self-pay | Admitting: *Deleted

## 2018-04-03 NOTE — Telephone Encounter (Signed)
Returned the patient's call and scheduled a follow up appt for May 2nd at 2:30pm; arrive at 2:15pm

## 2018-04-15 NOTE — Progress Notes (Signed)
Subjective:  CC -- Annual Physical; With complaints of tingling in upper extremites  Pt reports she has had tingling in upper extremities x3 weeks. States that they occur in hands and can radiate to upper arms. Feels like pins and needles. Sometimes palms can also be itchy. Denies tingling in lower extremities. Patient states tingling is worse at nighttime and occasionally worsens when she lays in bed on that side. States she has no weakness and is not dropping anything. Patient had recent surgery on her right shoulder otherwise no other trauma noted. Patient has a varied diet eating meat and fruits/vegetables. Eats lots of dark green/leafy vegetables. Patient takes a multivitamin "every once in a while" but not daily.   Cardiovascular: - Risk as of 10/05/17: 4.0% (assessment every 3-5 years) - Dx Hypertension: yes  - Dx Hyperlipidemia: yes  - Dx Obesity: yes, Class II - Physical Activity: no, but trying to improve   - Diabetes: no   Cancer: Colorectal >> Colonoscopy: is due for one, is in contact with GI to schedule. Patient receives colonoscopys every 5 years due to history of polyp Lung >> Tobacco Use: no   Breast >> Mammogram: yes, 07/28/17 benign  Cervical/Endometrial >>  - Postmenopausal: yes  - Vaginal Bleeding: no - Pap Smear: yes 6 months ago, followed by GYN  Skin >> Suspicious lesions: no   Social: Alcohol Use: no  Tobacco Use: no  Other Drugs: no  Risky Sexual Behavior: no, not currently sexually active  Depression: no, very involved in church so has no symptoms of depression  Support and Life at Home: yes   Other: Osteoporosis: no  Zoster Vaccine: no, not interested in getting   Flu Vaccine: no  Pneumonia Vaccine: no   Past Medical History Patient Active Problem List   Diagnosis Date Noted  . Neuropathy 04/18/2018  . Pineal gland cyst 10/01/2015  . Exophthalmos 10/01/2015  . Endometrial cancer ([place]) 08/11/2015  . Postmenopausal bleeding 07/28/2015  .  Atypical endometrial hyperplasia 07/28/2015  . Abnormal EKG 07/21/2015  . Benign paroxysmal positional vertigo 09/24/2014  . Healthcare maintenance 04/24/2014  . Uterine fibroid 01/04/2014  . Cervical disc disorder with radiculopathy of cervical region 10/24/2013  . Glaucoma   . GERD (gastroesophageal reflux disease) 09/09/2011  . Gastric polyp 09/09/2011  . ALLERGIC RHINITIS 10/14/2010  . BREAST MASS, BENIGN 02/04/2010  . BACK PAIN, CHRONIC 03/25/2009  . HYPERLIPIDEMIA 02/16/2007  . OBESITY, NOS 02/16/2007  . ANEMIA, OTHER, UNSPECIFIED 02/16/2007  . HYPERTENSION, BENIGN SYSTEMIC 02/16/2007  . Asthma 02/16/2007    Medications- reviewed and updated Current Outpatient Medications  Medication Sig Dispense Refill  . albuterol (PROAIR HFA) 108 (90 Base) MCG/ACT inhaler Inhale 2 puffs into the lungs every 4 (four) hours as needed. 2 Inhaler 11  . albuterol (PROVENTIL) (2.5 MG/3ML) 0.083% nebulizer solution Take 3 mLs (2.5 mg total) by nebulization every 6 (six) hours as needed for wheezing or shortness of breath. 75 mL 1  . Calcium Carbonate Antacid (ANTACID) 1177 MG CHEW Chew 1,177 mg daily as needed by mouth (acid reflux).    . cyclobenzaprine (FLEXERIL) 10 MG tablet Take 10 mg daily as needed by mouth for muscle spasms.    . dorzolamide-timolol (COSOPT) 22.3-6.8 MG/ML ophthalmic solution Place 1 drop into both eyes 2 (two) times daily. 10 mL 12  . esomeprazole (NEXIUM) 20 MG capsule Take 20 mg daily as needed by mouth (acid reflux).    . hydrochlorothiazide (HYDRODIURIL) 25 MG tablet Take 1 tablet (25 mg  total) by mouth daily. 90 tablet 0  . lisinopril (PRINIVIL,ZESTRIL) 5 MG tablet TAKE 1 TABLET(5 MG) BY MOUTH DAILY 90 tablet 0  . meclizine (ANTIVERT) 12.5 MG tablet Take 1 tablet (12.5 mg total) by mouth 3 (three) times daily as needed. 90 tablet 0  . Multiple Vitamin (MULTIVITAMIN WITH MINERALS) TABS tablet Take 1 tablet 3 (three) times a week by mouth.    . Multiple Vitamins-Minerals  (HAIR SKIN AND NAILS FORMULA PO) Take 1 tablet 3 (three) times a week by mouth.     . ondansetron (ZOFRAN) 4 MG tablet TAKE 1 TABLET BY MOUTH EVERY 8 HOURS AS NEEDED FOR NAUSEA AND VOMITING 20 tablet 0  . traMADol (ULTRAM) 50 MG tablet Take 50 mg daily as needed by mouth for moderate pain.    . Travoprost, BAK Free, (TRAVATAN) 0.004 % SOLN ophthalmic solution Place 1 drop into both eyes at bedtime. 2.5 mL 3   No current facility-administered medications for this visit.     Objective: BP 138/88   Pulse 69   Temp 98.2 F (36.8 C) (Oral)   Ht 5\' 6"  (1.676 m)   Wt 238 lb 12.8 oz (108.3 kg)   SpO2 95%   BMI 38.54 kg/m  Gen: NAD, alert, cooperative with exam  HEENT: NCAT, EOMI, PERRL CV: RRR, good S1/S2, no murmur Resp: CTABL, no wheezes, non-labored Abd: Soft, Non Tender, Non Distended, BS present, no guarding or organomegaly Genital Exam: not done Ext: No edema, warm, limited abduction of right shoulder, otherwise full ROM, negative Tinnel's sign bilaterally  Neuro: Alert and oriented, No gross deficits, sensation intact bilaterally    Assessment/Plan:  Healthcare maintenance -referral for colonoscopy made -ASA 81mg  was discontinued. Patient had been taking since mid 90s for primary prevention. Given new recommendations, will no longer need ASA for primary prevention. Patient has no PMHx of MI or CVA. No family history of MI or CVA. Patient requested to be taken off ASA after watching the news and learning about new recommendations   HYPERLIPIDEMIA Patient currently not on any medications x1 year. Will plan to retest Lipid panel and if elevated LDL start statin therapy. Patient previously on atorvastatin 40mg  daily but caused muscle pain.   Neuropathy Patient with bilateral upper extremity neuropathy. Unclear etiology at this time. Patient with history of cervical dysfunction, however no weakness noted. Will test folate and B12 to ensure no vit/mineral imbalance as cause. Patient  with no history of diabetes so unlikely diabetic neuropathy. Negative Tinnel's sign so unlikely carpel tunnel syndrome. Will continue to monitor.  -follow up in 1 month if no improvement -will obtain folate and B12, will either call or send letter with results -if no improvement can consider imaging or referral to neuro   Discussed patient with Dr. Erin Hearing   Orders Placed This Encounter  Procedures  . Lipid Panel  . Folate  . Vitamin B12  . Ambulatory referral to Gastroenterology    Referral Priority:   Routine    Referral Type:   Consultation    Referral Reason:   Specialty Services Required    Number of Visits Requested:   1    Meds ordered this encounter  Medications  . ondansetron (ZOFRAN) 4 MG tablet    Sig: TAKE 1 TABLET BY MOUTH EVERY 8 HOURS AS NEEDED FOR NAUSEA AND VOMITING    Dispense:  20 tablet    Refill:  0  . lisinopril (PRINIVIL,ZESTRIL) 5 MG tablet    Sig: TAKE 1 TABLET(5 MG)  BY MOUTH DAILY    Dispense:  90 tablet    Refill:  0     Caroline More, DO, PGY-1 04/19/2018 7:00 AM

## 2018-04-18 ENCOUNTER — Other Ambulatory Visit: Payer: Self-pay

## 2018-04-18 ENCOUNTER — Ambulatory Visit (INDEPENDENT_AMBULATORY_CARE_PROVIDER_SITE_OTHER): Payer: Medicare Other | Admitting: Family Medicine

## 2018-04-18 ENCOUNTER — Encounter: Payer: Self-pay | Admitting: Family Medicine

## 2018-04-18 VITALS — BP 138/88 | HR 69 | Temp 98.2°F | Ht 66.0 in | Wt 238.8 lb

## 2018-04-18 DIAGNOSIS — I10 Essential (primary) hypertension: Secondary | ICD-10-CM

## 2018-04-18 DIAGNOSIS — Z1211 Encounter for screening for malignant neoplasm of colon: Secondary | ICD-10-CM

## 2018-04-18 DIAGNOSIS — E785 Hyperlipidemia, unspecified: Secondary | ICD-10-CM

## 2018-04-18 DIAGNOSIS — Z0000 Encounter for general adult medical examination without abnormal findings: Secondary | ICD-10-CM

## 2018-04-18 DIAGNOSIS — G629 Polyneuropathy, unspecified: Secondary | ICD-10-CM

## 2018-04-18 DIAGNOSIS — R112 Nausea with vomiting, unspecified: Secondary | ICD-10-CM | POA: Diagnosis not present

## 2018-04-18 MED ORDER — ONDANSETRON HCL 4 MG PO TABS: ORAL_TABLET | ORAL | 0 refills | Status: DC

## 2018-04-18 MED ORDER — LISINOPRIL 5 MG PO TABS: ORAL_TABLET | ORAL | 0 refills | Status: DC

## 2018-04-18 NOTE — Assessment & Plan Note (Signed)
Patient currently not on any medications x1 year. Will plan to retest Lipid panel and if elevated LDL start statin therapy. Patient previously on atorvastatin 40mg  daily but caused muscle pain.

## 2018-04-18 NOTE — Patient Instructions (Signed)
It was a pleasure seeing you today.   Today we discussed your tingling in your arms and your health physical   For your tingling: I have ordered a B12 and folate level to make sure they are not low. If the tingling does not get better follow up in 1 month. If it worsens come in sooner.   For your cholesterol: I have ordered a lipid panel. If it is high I will re-order your cholesterol medicine.   Please stop taking aspirin. The new recommendations are to not use aspirin for primary prevention.   I have refilled your lisinopril and zofran.   Please follow up in 1 month or sooner if symptoms persist or worsen. Please call the clinic immediately if you have any concerns.   Our clinic's number is [phone]. Please call with questions or concerns.   Thank you,  Caroline More, DO

## 2018-04-18 NOTE — Assessment & Plan Note (Addendum)
Patient with bilateral upper extremity neuropathy. Unclear etiology at this time. Patient with history of cervical dysfunction, however no weakness noted. Will test folate and B12 to ensure no vit/mineral imbalance as cause. Patient with no history of diabetes so unlikely diabetic neuropathy. Negative Tinnel's sign so unlikely carpel tunnel syndrome. Will continue to monitor.  -follow up in 1 month if no improvement -will obtain folate and B12, will either call or send letter with results -if no improvement can consider imaging or referral to neuro   Discussed patient with Dr. Erin Hearing

## 2018-04-18 NOTE — Assessment & Plan Note (Addendum)
-  referral for colonoscopy made -ASA 81mg  was discontinued. Patient had been taking since mid 90s for primary prevention. Given new recommendations, will no longer need ASA for primary prevention. Patient has no PMHx of MI or CVA. No family history of MI or CVA. Patient requested to be taken off ASA after watching the news and learning about new recommendations

## 2018-04-19 LAB — LIPID PANEL
CHOL/HDL RATIO: 4.3 ratio (ref 0.0–4.4)
Cholesterol, Total: 196 mg/dL (ref 100–199)
HDL: 46 mg/dL (ref >39)
LDL CALC: 124 mg/dL — AB (ref 0–99)
Triglycerides: 131 mg/dL (ref 0–149)
VLDL CHOLESTEROL CAL: 26 mg/dL (ref 5–40)

## 2018-04-19 LAB — VITAMIN B12: VITAMIN B 12: 599 pg/mL (ref 232–1245)

## 2018-04-19 LAB — FOLATE: FOLATE: 18.1 ng/mL (ref >3.0)

## 2018-04-20 ENCOUNTER — Encounter: Payer: Self-pay | Admitting: Gynecologic Oncology

## 2018-04-20 ENCOUNTER — Inpatient Hospital Stay: Payer: Medicare Other | Attending: Gynecologic Oncology | Admitting: Gynecologic Oncology

## 2018-04-20 VITALS — BP 139/96 | HR 80 | Temp 98.8°F | Resp 18 | Ht 66.0 in | Wt 235.3 lb

## 2018-04-20 DIAGNOSIS — Z90710 Acquired absence of both cervix and uterus: Secondary | ICD-10-CM | POA: Insufficient documentation

## 2018-04-20 DIAGNOSIS — C541 Malignant neoplasm of endometrium: Secondary | ICD-10-CM

## 2018-04-20 DIAGNOSIS — Z90722 Acquired absence of ovaries, bilateral: Secondary | ICD-10-CM | POA: Diagnosis not present

## 2018-04-20 DIAGNOSIS — Z8542 Personal history of malignant neoplasm of other parts of uterus: Secondary | ICD-10-CM | POA: Diagnosis present

## 2018-04-20 DIAGNOSIS — Z6841 Body Mass Index (BMI) 40.0 and over, adult: Secondary | ICD-10-CM | POA: Diagnosis not present

## 2018-04-20 NOTE — Patient Instructions (Signed)
Please notify Dr Denman George at phone number [phone] if you notice vaginal bleeding, new pelvic or abdominal pains, bloating, feeling full easy, or a change in bladder or bowel function.  If the pain in your left side becomes worse rather than better (or stays stable), please notify Dr Denman George.  Please return to see Dr Leo Grosser in the fall of 2019 and return to see Dr Denman George in April, 2020. Please call her office at the above number in January, 2020 to make this appointment.

## 2018-04-20 NOTE — Progress Notes (Signed)
POSTOPERATIVE FOLLOWUP: endometrial cancer  Assessment:    62 y.o. year old with Stage IA Grade 1 endometrioid endometrial cancer.   S/p robotic assisted total hysterectomy, BSO and bilateral SLN biopsy on 09/04/15. no LVSI, 5% myometrial invasion, neg pelvic washings and negative lymph nodes. Morbid obesity (BMI 40kg/m2)   Plan: 1) endometrial cancer: no evidence for recurrence on today's exam. Counseled regarding symptoms consistent with recurrence. 2) obesity: counseled patient about the association between obesity and endometrial cancer and poor prognosis.  3)  Return to clinic in 6 months with Dr Leo Grosser and then 12 months to see me.  HPI:  Roberta [name] is a 62 y.o. year old [id number] initially seen in consultation on 08/11/15 for endometrial cancer.  She then underwent a robotic hysterectomy, BSO and bilateral sentinel node biopsy on 9/37/16 without complications.  Her postoperative course was uncomplicated.  Her final pathologic diagnosis is a Stage IA Grade 1 endometrioid endometrial cancer with no lymphovascular space invasion, 1/17 mm (5%) of myometrial invasion and negative lymph nodes (SLN's were removed from bilateral basins, however, no nodal tissue was removed from the left SLN specimen, therefore nodal evaluation was only complete on the right).  Interval Hx: She is seen today for a routine cancer surveillance visit.   She has persistent pains in the left abdomen which preceded her surgery and caner diagnosis and are felt to be associated with her back. She says these are slightly improved. 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 She has no other complaints today.  Current Outpatient Medications on File Prior to Visit  Medication Sig Dispense Refill  . albuterol (PROAIR HFA) 108 (90 Base) MCG/ACT inhaler Inhale 2 puffs into the lungs every 4 (four) hours as needed. 2 Inhaler 11  . albuterol (PROVENTIL)  (2.5 MG/3ML) 0.083% nebulizer solution Take 3 mLs (2.5 mg total) by nebulization every 6 (six) hours as needed for wheezing or shortness of breath. 75 mL 1  . cyclobenzaprine (FLEXERIL) 10 MG tablet Take 10 mg daily as needed by mouth for muscle spasms.    . dorzolamide-timolol (COSOPT) 22.3-6.8 MG/ML ophthalmic solution Place 1 drop into both eyes 2 (two) times daily. 10 mL 12  . esomeprazole (NEXIUM) 20 MG capsule Take 20 mg daily as needed by mouth (acid reflux).    . hydrochlorothiazide (HYDRODIURIL) 25 MG tablet Take 1 tablet (25 mg total) by mouth daily. 90 tablet 0  . lisinopril (PRINIVIL,ZESTRIL) 5 MG tablet TAKE 1 TABLET(5 MG) BY MOUTH DAILY 90 tablet 0  . meclizine (ANTIVERT) 12.5 MG tablet Take 1 tablet (12.5 mg total) by mouth 3 (three) times daily as needed. 90 tablet 0  . Multiple Vitamins-Minerals (HAIR SKIN AND NAILS FORMULA PO) Take 1 tablet 3 (three) times a week by mouth.     . ondansetron (ZOFRAN) 4 MG tablet TAKE 1 TABLET BY MOUTH EVERY 8 HOURS AS NEEDED FOR NAUSEA AND VOMITING 20 tablet 0  . traMADol (ULTRAM) 50 MG tablet Take 50 mg daily as needed by mouth for moderate pain.    . Travoprost, BAK Free, (TRAVATAN) 0.004 % SOLN ophthalmic solution Place 1 drop into both eyes at bedtime. 2.5 mL 3   No current facility-administered medications on file prior to visit.    Allergies  Allergen Reactions  . Influenza Vaccines Anaphylaxis    Throat swelling reported after flu shot in the past (cannot verify with records but would not give)  .  Budesonide Other (See Comments)    Throat infection per patient  . Flonase [Fluticasone Propionate] Other (See Comments)    Upper respitory  issues  . Codeine Nausea And Vomiting  . Morphine And Related Nausea And Vomiting  . Nickel Rash   Past Medical History:  Diagnosis Date  . Abnormal EKG    nonspecific ST and T-wave changes - Followed by Dr.Berry  . ALLERGIC RHINITIS 10/14/2010  . Anemia   . Asthma   . ASTHMA, INTERMITTENT  02/16/2007  . BACK PAIN, CHRONIC 03/25/2009  . CERVICAL RADICULOPATHY 10/16/2009  . DDD (degenerative disc disease), lumbosacral   . Depression   . DEPRESSIVE DISORDER NOT ELSEWHERE CLASSIFIED 03/25/2009  . Endometrial cancer ([place])   . GERD (gastroesophageal reflux disease) 09/09/2011  . Glaucoma    sees optho every 3 months, drops each night  . Hyperlipidemia   . Hypertension   . PONV (postoperative nausea and vomiting)   . Postmenopausal vaginal bleeding 09/24/2014   Past Surgical History:  Procedure Laterality Date  . ANKLE SURGERY    . BIOPSY BREAST     LEFT  . BREAST CYST INCISION AND DRAINAGE     under breast  . BUNIONECTOMY     bilateral and toe correction  . CARPAL TUNNEL RELEASE Bilateral 2009  . CATARACT SURGERY    . CERVICAL SPINE SURGERY    . CHOLECYSTECTOMY  1980  . DILATATION & CURRETTAGE/HYSTEROSCOPY WITH RESECTOCOPE N/A 07/28/2015   Procedure: DILATATION & CURETTAGE/HYSTEROSCOPY WITH RESECTOCOPE;  Surgeon: Eldred Manges, MD;  Location: [facility];  Service: Gynecology;  Laterality: N/A;  . KNEE ARTHROSCOPY    . ROBOTIC ASSISTED TOTAL HYSTERECTOMY WITH BILATERAL SALPINGO OOPHERECTOMY Bilateral 09/04/2015   Procedure: ROBOTIC ASSISTED HYSTERECTOMY WITH BILATERAL SALPINGO OOPHORECTOMY SENTINAL NODE MAPPING ;  Surgeon: Everitt Amber, MD;  Location: [facility];  Service: Gynecology;  Laterality: Bilateral;  . ROTATOR CUFF REPAIR Right May 2014   Family History  Problem Relation Age of Onset  . Heart disease Mother        MI in 3s  . Lymphoma Mother        related to asbestos  . Cancer Mother        lymphoma (asbestos exposure)  . Alcoholism Father   . Cirrhosis Father        due to alcohol  . Asthma Father   . Cancer Maternal Uncle        lung  . Cancer Maternal Uncle        lung   Social History   Socioeconomic History  . Marital status: Divorced    Spouse name: Not on file  . Number of children: 4  . Years of education: 134  . Highest education level: Not on  file  Occupational History  . Occupation: DISABLED    Employer: UNEMPLOYED  . Occupation: Previously a Proofreader  . Financial resource strain: Not on file  . Food insecurity:    Worry: Not on file    Inability: Not on file  . Transportation needs:    Medical: Not on file    Non-medical: Not on file  Tobacco Use  . Smoking status: Never Smoker  . Smokeless tobacco: Never Used  Substance and Sexual Activity  . Alcohol use: No    Alcohol/week: 0.0 oz  . Drug use: No  . Sexual activity: Never  Lifestyle  . Physical activity:    Days per week: Not on file    Minutes per session:  Not on file  . Stress: Not on file  Relationships  . Social connections:    Talks on phone: Not on file    Gets together: Not on file    Attends religious service: Not on file    Active member of club or organization: Not on file    Attends meetings of clubs or organizations: Not on file    Relationship status: Not on file  . Intimate partner violence:    Fear of current or ex partner: Not on file    Emotionally abused: Not on file    Physically abused: Not on file    Forced sexual activity: Not on file  Other Topics Concern  . 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 Part time student due to learning disability. 4 kids.       On disability.       College education. Lives alone. No stairs.               Review of systems: Constitutional:  She has no weight gain or weight loss. She has no fever or chills. Eyes: No blurred vision Ears, Nose, Mouth, Throat: No dizziness, headaches or changes in hearing. No mouth sores. Cardiovascular: No chest pain, palpitations or edema. Respiratory:  No shortness of breath, wheezing or cough Gastrointestinal: She has normal bowel movements without diarrhea or constipation. She denies any nausea or vomiting. She denies blood  in her stool or heart burn. + left abdominal/low flank pain Genitourinary:  She denies pelvic pain, pelvic pressure or changes in her urinary function. She has no hematuria, dysuria, or incontinence. She has no irregular vaginal bleeding or vaginal discharge Musculoskeletal: Denies muscle weakness or joint pains.  Skin:  She has no skin changes, rashes or itching Neurological:  Denies dizziness or headaches. No neuropathy, no numbness or tingling. Psychiatric:  She denies depression or anxiety. Hematologic/Lymphatic:   No easy bruising or bleeding   Physical Exam: Blood pressure (!) 139/96, pulse 80, temperature 98.8 F (37.1 C), temperature source Oral, resp. rate 18, height 5\' 6"  (1.676 m), weight 235 lb 4.8 oz (106.7 kg), SpO2 98 %. General: Well dressed, well nourished in no apparent distress.   HEENT:  Normocephalic and atraumatic, no lesions.  Extraocular muscles intact. Sclerae anicteric. Pupils equal, round, reactive. No mouth sores or ulcers. Thyroid is normal size, not nodular, midline. Skin:  No lesions or rashes. Abdomen:  Soft, nontender, nondistended.  No palpable masses.  No hepatosplenomegaly.  No ascites. Normal bowel sounds.  No hernias.  Incisions are well healed. Genitourinary: Normal EGBUS  Vaginal cuff intact.  No bleeding or discharge.  No cul de sac fullness. Extremities: No cyanosis, clubbing or edema.  No calf tenderness or erythema. No palpable cords. Psychiatric: Mood and affect are appropriate. Neurological: Awake, alert and oriented x 3. Sensation is intact, no neuropathy.  Musculoskeletal: No pain, normal strength and range of motion.  Thereasa Solo, MD

## 2018-04-21 ENCOUNTER — Other Ambulatory Visit: Payer: Self-pay | Admitting: Family Medicine

## 2018-04-21 DIAGNOSIS — E785 Hyperlipidemia, unspecified: Secondary | ICD-10-CM

## 2018-04-21 MED ORDER — PRAVASTATIN SODIUM 40 MG PO TABS: 40.0000 mg | ORAL_TABLET | Freq: Every day | ORAL | 2 refills | Status: DC

## 2018-04-21 NOTE — Progress Notes (Signed)
Lipid panel showing elevated LDL of 124. ASCVD risk of 10.3% with recommendations of starting statin. Patient with previous myalgia with atorvastatin. Will try pravastatin 40mg  daily. Follow up in 2 months to re-check lipid panel. Patient informed that prescription would be sent to her desired pharmacy.   Dalphine Handing, PGY-1 [facility] 04/21/2018 1:43 PM

## 2018-04-28 DIAGNOSIS — M5416 Radiculopathy, lumbar region: Secondary | ICD-10-CM | POA: Diagnosis not present

## 2018-05-07 ENCOUNTER — Encounter: Payer: Self-pay | Admitting: Family Medicine

## 2018-05-11 DIAGNOSIS — M5416 Radiculopathy, lumbar region: Secondary | ICD-10-CM | POA: Diagnosis not present

## 2018-05-17 DIAGNOSIS — M545 Low back pain: Secondary | ICD-10-CM | POA: Diagnosis not present

## 2018-05-17 DIAGNOSIS — R3121 Asymptomatic microscopic hematuria: Secondary | ICD-10-CM | POA: Diagnosis not present

## 2018-05-19 DIAGNOSIS — M533 Sacrococcygeal disorders, not elsewhere classified: Secondary | ICD-10-CM | POA: Diagnosis not present

## 2018-05-19 NOTE — Progress Notes (Unsigned)
   Subjective:    Patient ID: Roberta [name], female    DOB: [birth year], 62 y.o.   MRN: [id number]   CC: f/u meds and A1C check  HPI: Diabetes??? No previous diagnosis  Fasting checks: *** Post prandial ***  Compliance: *** Diet: ***  Exercise: *** Eye exam: *** Foot exam: *** A1C: *** Symptoms: *** symptoms of hypoglycemia. *** symptoms of  polyuria, polydipsia. *** numbness in extremities, and *** foot ulcers/trauma Meds: ***  Hyperlipidemia Meds: pravastatin 40mg  daily  Diet: *** Exercise: ***  Lipid panel showing elevated LDL of 124. ASCVD risk of 10.3% with recommendations of starting statin. Patient with previous myalgia with atorvastatin. Will try pravastatin 40mg  daily. Follow up in 2 months to re-check lipid panel. Patient informed that prescription would be sent to her desired pharmacy.   ***lipid panel too soon ***CMP    Smoking status reviewed  Review of Systems   Objective:  There were no vitals taken for this visit. Vitals and nursing note reviewed  General: well nourished, in no acute distress HEENT: normocephalic, TM's visualized bilaterally, no scleral icterus or conjunctival pallor, no nasal discharge, moist mucous membranes, good dentition without erythema or discharge noted in posterior oropharynx Neck: supple, non-tender, without lymphadenopathy Cardiac: RRR, clear S1 and S2, no murmurs, rubs, or gallops Respiratory: clear to auscultation bilaterally, no increased work of breathing Abdomen: soft, nontender, nondistended, no masses or organomegaly. Bowel sounds present Extremities: no edema or cyanosis. Warm, well perfused. 2+ radial and PT pulses bilaterally Skin: warm and dry, no rashes noted Neuro: alert and oriented, no focal deficits   Assessment & Plan:    No problem-specific Assessment & Plan notes found for this encounter.    No follow-ups on file.   Caroline More, DO, PGY-1

## 2018-05-22 ENCOUNTER — Ambulatory Visit: Payer: Medicare Other | Admitting: Family Medicine

## 2018-05-26 ENCOUNTER — Ambulatory Visit (INDEPENDENT_AMBULATORY_CARE_PROVIDER_SITE_OTHER): Payer: Medicare Other

## 2018-05-26 ENCOUNTER — Encounter (HOSPITAL_COMMUNITY): Payer: Self-pay | Admitting: Emergency Medicine

## 2018-05-26 ENCOUNTER — Ambulatory Visit (HOSPITAL_COMMUNITY)
Admission: EM | Admit: 2018-05-26 | Discharge: 2018-05-26 | Disposition: A | Discharge status: Home / Self Care | Payer: Medicare Other | Attending: Family Medicine | Admitting: Family Medicine

## 2018-05-26 DIAGNOSIS — S99912A Unspecified injury of left ankle, initial encounter: Secondary | ICD-10-CM | POA: Diagnosis not present

## 2018-05-26 DIAGNOSIS — S93402A Sprain of unspecified ligament of left ankle, initial encounter: Secondary | ICD-10-CM

## 2018-05-26 DIAGNOSIS — S99922A Unspecified injury of left foot, initial encounter: Secondary | ICD-10-CM | POA: Diagnosis not present

## 2018-05-26 MED ORDER — NAPROXEN 375 MG PO TABS: 375.0000 mg | ORAL_TABLET | Freq: Two times a day (BID) | ORAL | 0 refills | Status: DC

## 2018-05-26 NOTE — ED Triage Notes (Signed)
Pt states a week ago she twisted her L ankle walking up the stairs. C/o ongoing pain. Has brought home brace.

## 2018-05-26 NOTE — Discharge Instructions (Addendum)
Ice, elevation Naproxen twice a day for pain.  Use of brace, especially while active.  If symptoms worsen or do not improve in the next 2-3 weeks to return to follow up with orthopedist/podiatrist.

## 2018-05-26 NOTE — ED Provider Notes (Signed)
[facility]    CSN: [id number] Arrival date & time: 05/26/18  1555     History   Chief Complaint Chief Complaint  Patient presents with  . Ankle Pain    HPI Roberta [name] is a 62 y.o. female.   Roberta [name] presents with complaints of left ankle pain after she twisted it 1 week ago. She thinks she did it while going up or down her brother's steep stairs. Pain has worsened. Has started wearing a brace which does help. 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 Swelling present. Has been elevating foot. Pain is with weight bearing. 10/10 with weight bearing. Yesterday took tramadol and flexeril which helped some with pain. Hx of asthma, ddd, gerd, endometrial cancer, glaucoma, htn, neuropathy.   ROS per HPI.      Past Medical History:  Diagnosis Date  . Abnormal EKG    nonspecific ST and T-wave changes - Followed by Dr.Berry  . ALLERGIC RHINITIS 10/14/2010  . Anemia   . Asthma   . ASTHMA, INTERMITTENT 02/16/2007  . BACK PAIN, CHRONIC 03/25/2009  . CERVICAL RADICULOPATHY 10/16/2009  . DDD (degenerative disc disease), lumbosacral   . Depression   . DEPRESSIVE DISORDER NOT ELSEWHERE CLASSIFIED 03/25/2009  . Endometrial cancer ([place])   . GERD (gastroesophageal reflux disease) 09/09/2011  . Glaucoma    sees optho every 3 months, drops each night  . Hyperlipidemia   . Hypertension   . PONV (postoperative nausea and vomiting)   . Postmenopausal vaginal bleeding 09/24/2014    Patient Active Problem List   Diagnosis Date Noted  . Neuropathy 04/18/2018  . Pineal gland cyst 10/01/2015  . Exophthalmos 10/01/2015  . Endometrial cancer ([place]) 08/11/2015  . Postmenopausal bleeding 07/28/2015  . Atypical endometrial hyperplasia 07/28/2015  . Abnormal EKG 07/21/2015  . Benign paroxysmal positional vertigo 09/24/2014  . Healthcare maintenance 04/24/2014  . Uterine fibroid 01/04/2014  . Cervical disc  disorder with radiculopathy of cervical region 10/24/2013  . Glaucoma   . GERD (gastroesophageal reflux disease) 09/09/2011  . Gastric polyp 09/09/2011  . ALLERGIC RHINITIS 10/14/2010  . BREAST MASS, BENIGN 02/04/2010  . BACK PAIN, CHRONIC 03/25/2009  . HYPERLIPIDEMIA 02/16/2007  . OBESITY, NOS 02/16/2007  . ANEMIA, OTHER, UNSPECIFIED 02/16/2007  . HYPERTENSION, BENIGN SYSTEMIC 02/16/2007  . Asthma 02/16/2007    Past Surgical History:  Procedure Laterality Date  . ANKLE SURGERY    . BIOPSY BREAST     LEFT  . BREAST CYST INCISION AND DRAINAGE     under breast  . BUNIONECTOMY     bilateral and toe correction  . CARPAL TUNNEL RELEASE Bilateral 2009  . CATARACT SURGERY    . CERVICAL SPINE SURGERY    . CHOLECYSTECTOMY  1980  . DILATATION & CURRETTAGE/HYSTEROSCOPY WITH RESECTOCOPE N/A 07/28/2015   Procedure: DILATATION & CURETTAGE/HYSTEROSCOPY WITH RESECTOCOPE;  Surgeon: Eldred Manges, MD;  Location: [facility];  Service: Gynecology;  Laterality: N/A;  . KNEE ARTHROSCOPY    . ROBOTIC ASSISTED TOTAL HYSTERECTOMY WITH BILATERAL SALPINGO OOPHERECTOMY Bilateral 09/04/2015   Procedure: ROBOTIC ASSISTED HYSTERECTOMY WITH BILATERAL SALPINGO OOPHORECTOMY SENTINAL NODE MAPPING ;  Surgeon: Everitt Amber, MD;  Location: [facility];  Service: Gynecology;  Laterality: Bilateral;  . ROTATOR CUFF REPAIR Right May 2014    OB History    Gravida  4   Para  3   Term  3   Preterm  AB      Living  4     SAB      TAB      Ectopic      Multiple  0   Live Births  4            Home Medications    Prior to Admission medications   Medication Sig Start Date End Date Taking? Authorizing Provider  albuterol (PROAIR HFA) 108 (90 Base) MCG/ACT inhaler Inhale 2 puffs into the lungs every 4 (four) hours as needed. 09/28/17   Rogue Bussing, MD  albuterol (PROVENTIL) (2.5 MG/3ML) 0.083% nebulizer solution Take 3 mLs (2.5 mg total) by nebulization every 6 (six) hours as needed for  wheezing or shortness of breath. 01/28/17   Eloise Levels, MD  cyclobenzaprine (FLEXERIL) 10 MG tablet Take 10 mg daily as needed by mouth for muscle spasms.    [provider]  dorzolamide-timolol (COSOPT) 22.3-6.8 MG/ML ophthalmic solution Place 1 drop into both eyes 2 (two) times daily. 10/05/17   Eloise Levels, MD  esomeprazole (NEXIUM) 20 MG capsule Take 20 mg daily as needed by mouth (acid reflux).    [provider]  hydrochlorothiazide (HYDRODIURIL) 25 MG tablet Take 1 tablet (25 mg total) by mouth daily. 03/22/18   Caroline More, DO  lisinopril (PRINIVIL,ZESTRIL) 5 MG tablet TAKE 1 TABLET(5 MG) BY MOUTH DAILY 04/18/18   Caroline More, DO  meclizine (ANTIVERT) 12.5 MG tablet Take 1 tablet (12.5 mg total) by mouth 3 (three) times daily as needed. 07/08/17   Eloise Levels, MD  Multiple Vitamins-Minerals (HAIR SKIN AND NAILS FORMULA PO) Take 1 tablet 3 (three) times a week by mouth.     [provider]  naproxen (NAPROSYN) 375 MG tablet Take 1 tablet (375 mg total) by mouth 2 (two) times daily. 05/26/18   Zigmund Gottron, NP  ondansetron (ZOFRAN) 4 MG tablet TAKE 1 TABLET BY MOUTH EVERY 8 HOURS AS NEEDED FOR NAUSEA AND VOMITING 04/18/18   Caroline More, DO  pravastatin (PRAVACHOL) 40 MG tablet Take 1 tablet (40 mg total) by mouth daily. 04/21/18   Caroline More, DO  traMADol (ULTRAM) 50 MG tablet Take 50 mg daily as needed by mouth for moderate pain.    [provider]  Travoprost, BAK Free, (TRAVATAN) 0.004 % SOLN ophthalmic solution Place 1 drop into both eyes at bedtime. 10/23/14   Bernadene Bell, MD    Family History Family History  Problem Relation Age of Onset  . Heart disease Mother        MI in 49s  . Lymphoma Mother        related to asbestos  . Cancer Mother        lymphoma (asbestos exposure)  . Alcoholism Father   . Cirrhosis Father        due to alcohol  . Asthma Father   . Cancer Maternal Uncle        lung  . Cancer Maternal  Uncle        lung    Social History Social History   Tobacco Use  . Smoking status: Never Smoker  . Smokeless tobacco: Never Used  Substance Use Topics  . Alcohol use: No    Alcohol/week: 0.0 oz  . Drug use: No     Allergies   Influenza vaccines; Budesonide; Flonase [fluticasone propionate]; Codeine; Morphine and related; and Nickel   Review of Systems Review of Systems   Physical Exam Triage Vital  Signs ED Triage Vitals [05/26/18 1613]  Enc Vitals Group     BP (!) 139/92     Pulse Rate 67     Resp 18     Temp 98.2 F (36.8 C)     Temp src      SpO2 97 %     Weight      Height      Head Circumference      Peak Flow      Pain Score      Pain Loc      Pain Edu?      Excl. in [place]?    No data found.  Updated Vital Signs BP (!) 139/92   Pulse 67   Temp 98.2 F (36.8 C)   Resp 18   SpO2 97%    Physical Exam  Constitutional: She is oriented to person, place, and time. She appears well-developed and well-nourished. No distress.  Cardiovascular: Normal rate, regular rhythm and normal heart sounds.  Pulmonary/Chest: Effort normal and breath sounds normal.  Musculoskeletal:       Left ankle: She exhibits swelling. She exhibits normal range of motion, no ecchymosis, no deformity, no laceration and normal pulse. Tenderness. Lateral malleolus tenderness found. Achilles tendon normal.       Left foot: There is tenderness and bony tenderness. There is normal range of motion, no swelling, normal capillary refill, no crepitus, no deformity and no laceration.       Feet:  Scarring to left foot great toe noted from previous surgery; tenderness primarily to left lateral ankle and surrounding tissue; full ROm to ankle but pain with rotation; tenderness at distal 3-4 metatarsals; strong pedal pulse; cap refill < 2 seconds   Neurological: She is alert and oriented to person, place, and time.  Skin: Skin is warm and dry.     UC Treatments / Results  Labs (all labs  ordered are listed, but only abnormal results are displayed) Labs Reviewed - No data to display  EKG None  Radiology Dg Ankle Complete Left  Result Date: 05/26/2018 CLINICAL DATA:  62 year old female status post twisting injury going up stairs. Unable to weightbear. Prior ankle fracture and left foot surgery. EXAM: LEFT ANKLE COMPLETE - 3+ VIEW COMPARISON:  None. FINDINGS: Partially visible 1st ray cannulated screws in the left foot. Background bone mineralization is within normal limits. Mortise joint alignment is preserved. No ankle joint effusion is evident. The talar dome is intact. Chronic cortical irregularity along the medial malleolus. No acute fracture or dislocation identified. The calcaneus appears intact. There is multifocal degenerative spurring along the dorsal tarsal bones. IMPRESSION: No acute fracture or dislocation identified about the left ankle. Electronically Signed   By: Genevie Ann M.D.   On: 05/26/2018 16:43   Dg Foot Complete Left  Result Date: 05/26/2018 CLINICAL DATA:  Pain and tenderness in the region of the left 3rd and 4th metatarsals after falling on steps. EXAM: LEFT FOOT - COMPLETE 3+ VIEW COMPARISON:  Left ankle radiographs obtained earlier today. FINDINGS: Two fixation screws in the proximal 1st metatarsal with an old, healed fracture deformity of the metatarsal. No acute fracture or dislocation. There is some flattening of the normal plantar arch. Dorsal tarsal spur formation is also noted. Minimal posterior calcaneal spur formation. IMPRESSION: 1. No fracture seen. 2. Postsurgical and mild degenerative changes. 3. Mild pes planus. Electronically Signed   By: Claudie Revering M.D.   On: 05/26/2018 17:32    Procedures Procedures (including critical  care time)  Medications Ordered in UC Medications - No data to display  Initial Impression / Assessment and Plan / UC Course  I have reviewed the triage vital signs and the nursing notes.  Pertinent labs & imaging results  that were available during my care of the patient were reviewed by me and considered in my medical decision making (see chart for details).     xrays reassuring today. Patient has been ambulatory for the past week. Consistent with sprain. Ice, elevation, naproxen, brace for pain control. Encouraged to follow up with her podiatrist/orthopedist for persistent or worsening of symptoms. Patient verbalized understanding and agreeable to plan.  Ambulatory out of clinic.   Final Clinical Impressions(s) / UC Diagnoses   Final diagnoses:  Sprain of left ankle, unspecified ligament, initial encounter     Discharge Instructions     Ice, elevation Naproxen twice a day for pain.  Use of brace, especially while active.  If symptoms worsen or do not improve in the next 2-3 weeks to return to follow up with orthopedist/podiatrist.       ED Prescriptions    Medication Sig Dispense Auth. Provider   naproxen (NAPROSYN) 375 MG tablet Take 1 tablet (375 mg total) by mouth 2 (two) times daily. 20 tablet Zigmund Gottron, NP     Controlled Substance Prescriptions [place] Controlled Substance Registry consulted? Not Applicable   Zigmund Gottron, NP 05/26/18 1744

## 2018-05-28 IMAGING — CR DG FOOT COMPLETE 3+V*R*
3 series · 3 of 3 positions shown · non-contrast
Comparison: Right foot films of 04/09/2015

CLINICAL DATA: New onset of right foot pain with swelling over the
last 3 weeks, history prior bunionectomy

EXAM:
RIGHT FOOT COMPLETE - 3+ VIEW

[view not recorded (1 of 3)]
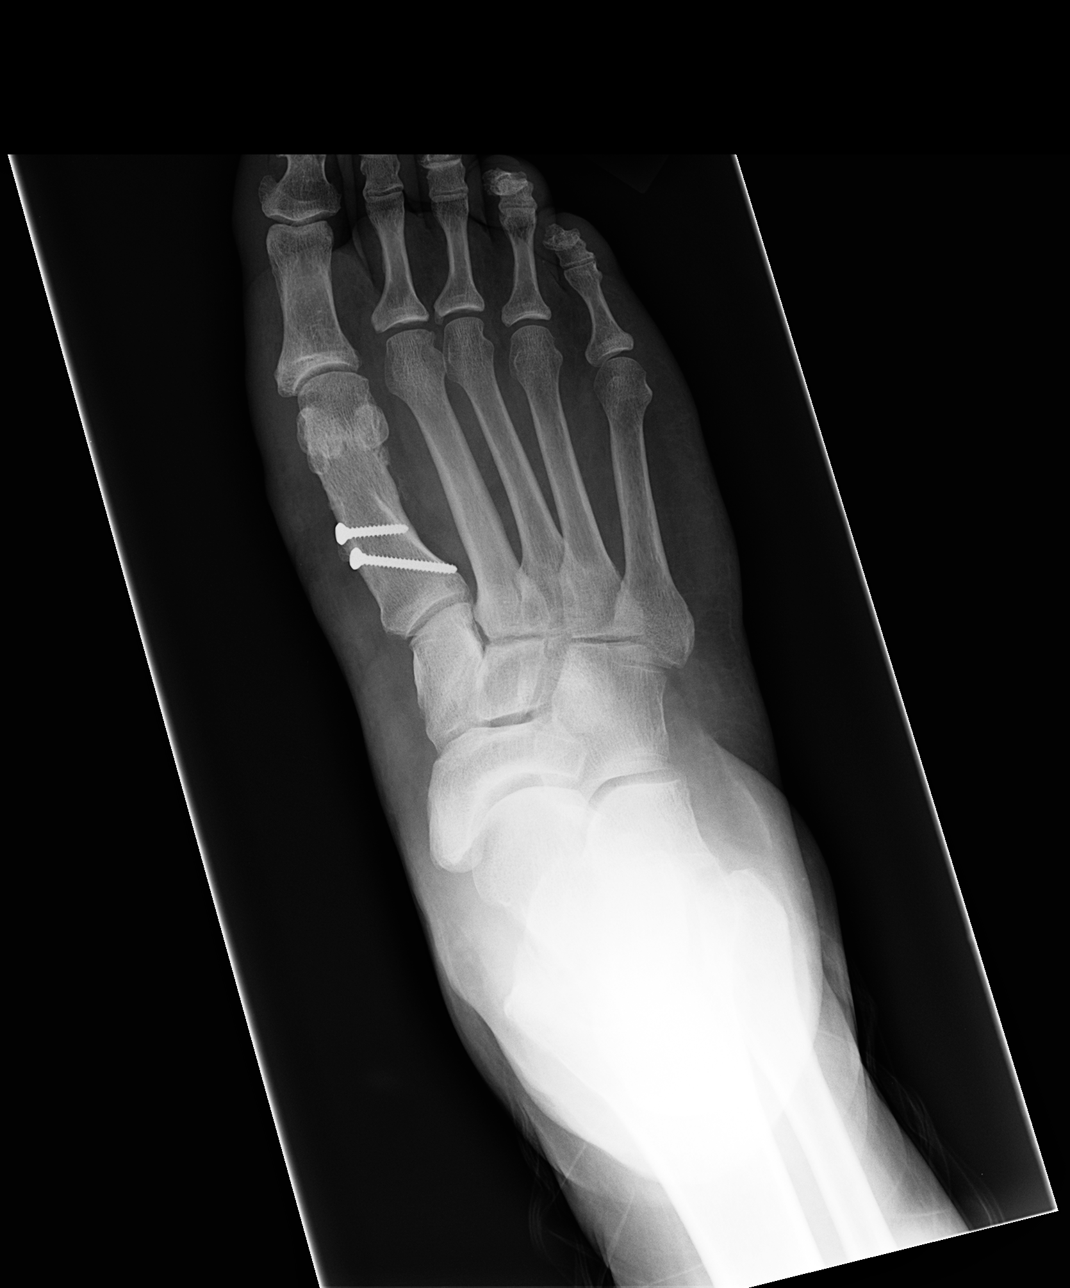

[view not recorded (2 of 3)]
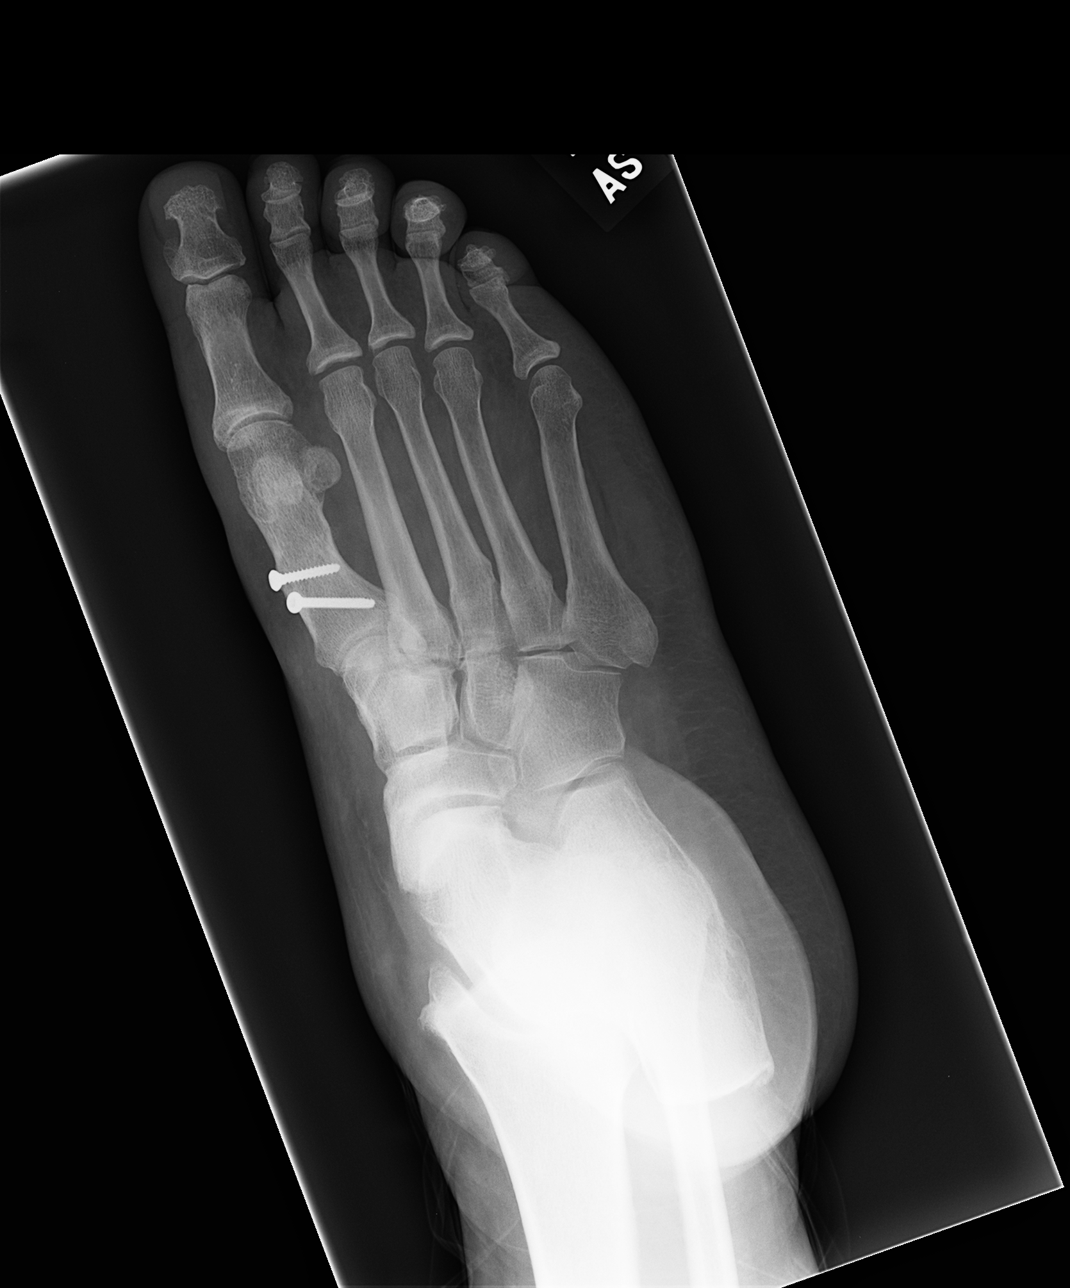

[view not recorded (3 of 3)]
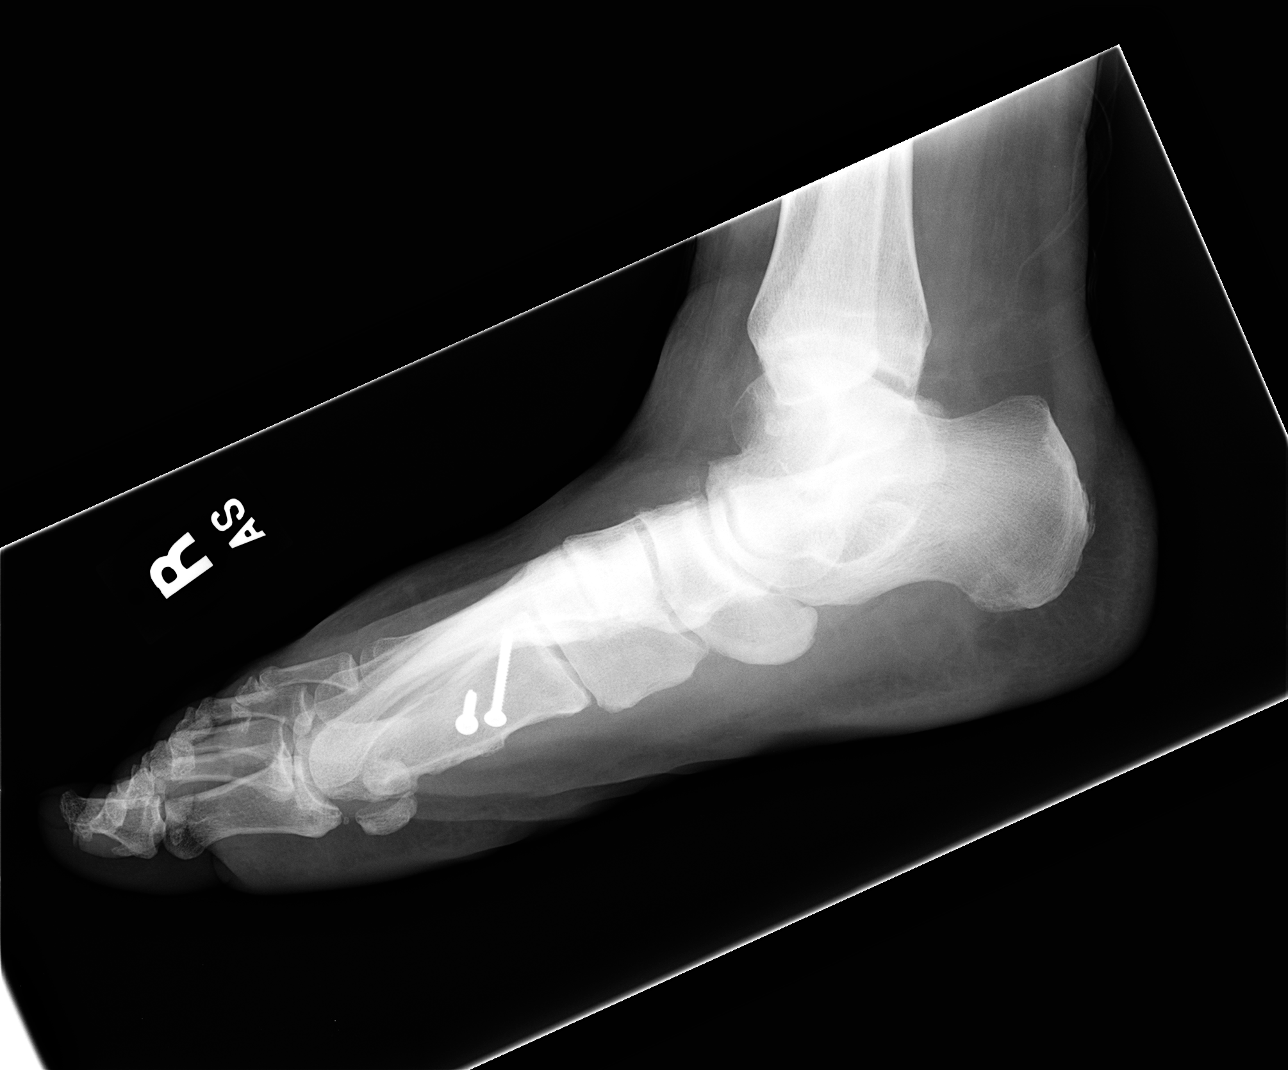

[3 of 3 positions shown; findings below may reference images not displayed]

FINDINGS: Screws are present through the mid proximal right first metatarsal
from prior surgical intervention. No evidence of loosening of either
screw is seen. Mild degenerative change is noted at the right first
MTP joint. No erosions are evident. No acute abnormality is seen.
IMPRESSION: Prior surgery on the right first metatarsal with no complicating
features. No screw loosening is seen.

## 2018-06-19 DIAGNOSIS — K219 Gastro-esophageal reflux disease without esophagitis: Secondary | ICD-10-CM | POA: Diagnosis not present

## 2018-06-21 ENCOUNTER — Other Ambulatory Visit: Payer: Self-pay | Admitting: Family Medicine

## 2018-06-27 DIAGNOSIS — M533 Sacrococcygeal disorders, not elsewhere classified: Secondary | ICD-10-CM | POA: Diagnosis not present

## 2018-07-04 DIAGNOSIS — H40013 Open angle with borderline findings, low risk, bilateral: Secondary | ICD-10-CM | POA: Diagnosis not present

## 2018-07-14 ENCOUNTER — Other Ambulatory Visit: Payer: Self-pay | Admitting: Family Medicine

## 2018-07-14 DIAGNOSIS — I10 Essential (primary) hypertension: Secondary | ICD-10-CM

## 2018-07-14 DIAGNOSIS — E785 Hyperlipidemia, unspecified: Secondary | ICD-10-CM

## 2018-08-25 DIAGNOSIS — K640 First degree hemorrhoids: Secondary | ICD-10-CM | POA: Diagnosis not present

## 2018-08-25 DIAGNOSIS — Z86010 Personal history of colonic polyps: Secondary | ICD-10-CM | POA: Diagnosis not present

## 2018-08-25 DIAGNOSIS — D122 Benign neoplasm of ascending colon: Secondary | ICD-10-CM | POA: Diagnosis not present

## 2018-08-25 DIAGNOSIS — D125 Benign neoplasm of sigmoid colon: Secondary | ICD-10-CM | POA: Diagnosis not present

## 2018-08-25 DIAGNOSIS — K5730 Diverticulosis of large intestine without perforation or abscess without bleeding: Secondary | ICD-10-CM | POA: Diagnosis not present

## 2018-08-28 DIAGNOSIS — M542 Cervicalgia: Secondary | ICD-10-CM | POA: Diagnosis not present

## 2018-08-30 DIAGNOSIS — D122 Benign neoplasm of ascending colon: Secondary | ICD-10-CM | POA: Diagnosis not present

## 2018-08-30 DIAGNOSIS — D125 Benign neoplasm of sigmoid colon: Secondary | ICD-10-CM | POA: Diagnosis not present

## 2018-09-11 ENCOUNTER — Ambulatory Visit: Payer: Medicare Other | Admitting: Family Medicine

## 2018-09-11 ENCOUNTER — Telehealth: Payer: Self-pay | Admitting: Family Medicine

## 2018-09-11 NOTE — Telephone Encounter (Signed)
Pt was coming in for a f/u on her medications. She wanted to wait and see her pcp even though the first appointment is 10/06/18. She did want to make sure that the doctor is okay with that or should she be seen sooner. Please call patient jw

## 2018-09-12 NOTE — Telephone Encounter (Signed)
If my first available appointment is then, then yes she can be seen on 10/06/18  Caroline More, DO, PGY-2 [facility] 09/12/2018 9:20 AM

## 2018-09-12 NOTE — Telephone Encounter (Signed)
Pt informed. Roberta [name], CMA  

## 2018-10-02 DIAGNOSIS — Z1231 Encounter for screening mammogram for malignant neoplasm of breast: Secondary | ICD-10-CM | POA: Diagnosis not present

## 2018-10-02 DIAGNOSIS — M1711 Unilateral primary osteoarthritis, right knee: Secondary | ICD-10-CM | POA: Diagnosis not present

## 2018-10-02 DIAGNOSIS — R350 Frequency of micturition: Secondary | ICD-10-CM | POA: Diagnosis not present

## 2018-10-04 ENCOUNTER — Other Ambulatory Visit: Payer: Self-pay | Admitting: Family Medicine

## 2018-10-06 ENCOUNTER — Ambulatory Visit: Payer: Medicare Other | Admitting: Family Medicine

## 2018-10-07 NOTE — Progress Notes (Signed)
   Subjective:    Patient ID: Roberta [name], female    DOB: 09-29-1956, 62 y.o.   MRN: [id number]   CC: HLD, HTN  HPI: Hyperlipidemia Meds: pravastatin 40mg   Diet: Trying to eat healthier.  Has increased fiber.  Eats more beans.  Eats apples peaches and plums. Exercise: Daily.  Tries to walk every day and is having about strep brought further away so she is walking her.  Used to go to the gym but no longer has any transportation.  Has daily exercise with grandchildren as well. Side effects: Patient experiencing myalgia.  States that she began to have muscle aches in her knees and back since starting pravastatin.  Muscles also spasm.  Tried taking medication every other day with no success.  Hypertension: - Medications: lisinopril 5mg , HCTZ 25mg  - Compliance: good - Checking BP at home: no - Denies any SOB, CP, vision changes, LE edema, medication SEs, or symptoms of hypotension - Diet: see above  - Exercise: see above    Objective:  BP 130/80   Pulse (!) 53   Temp 97.7 F (36.5 C) (Oral)   Ht 5\' 6"  (1.676 m)   Wt 248 lb 9.6 oz (112.8 kg)   SpO2 98%   BMI 40.13 kg/m  Vitals and nursing note reviewed  General: well nourished, in no acute distress HEENT: normocephalic, PERRL, no scleral icterus or conjunctival pallor, no nasal discharge, moist mucous membranes, good dentition without erythema or discharge noted in posterior oropharynx Neck: supple, non-tender, without lymphadenopathy Cardiac: RRR, clear S1 and S2, no murmurs, rubs, or gallops Respiratory: clear to auscultation bilaterally, no increased work of breathing Abdomen: soft, nontender, nondistended, no masses or organomegaly. Bowel sounds present Extremities: no edema or cyanosis. Warm, well perfused.   Skin: warm and dry, no rashes noted Neuro: alert and oriented, no focal deficits   Assessment & Plan:    HYPERTENSION, BENIGN SYSTEMIC Within goal today.  We will continue lisinopril 5 mg as well as had  chlorothiazide 25 mg.  Follow-up in 3 months.  HYPERLIPIDEMIA Patient with myalgias secondary to starting pravastatin.  Patient has tried every other day dosing but this is not helped.  We will plan to check CK today to ensure no muscle damage.  Will allow a short break of time between starting another statin.  After symptoms resolve can initiate either fluvastatin or pitavastatin as patient unable to tolerate pravastatin at every other day dosing.  Follow-up in 3 months.  Strict return precautions given.  Advised patient to call if she has any myalgia with new statin therapy.    Return in about 3 months (around 01/10/2019).   Caroline More, DO, PGY-2

## 2018-10-10 ENCOUNTER — Other Ambulatory Visit: Payer: Self-pay

## 2018-10-10 ENCOUNTER — Ambulatory Visit (INDEPENDENT_AMBULATORY_CARE_PROVIDER_SITE_OTHER): Payer: Medicare Other | Admitting: Family Medicine

## 2018-10-10 ENCOUNTER — Encounter: Payer: Self-pay | Admitting: Family Medicine

## 2018-10-10 VITALS — BP 130/80 | HR 53 | Temp 97.7°F | Ht 66.0 in | Wt 248.6 lb

## 2018-10-10 DIAGNOSIS — E785 Hyperlipidemia, unspecified: Secondary | ICD-10-CM | POA: Diagnosis not present

## 2018-10-10 DIAGNOSIS — M791 Myalgia, unspecified site: Secondary | ICD-10-CM

## 2018-10-10 DIAGNOSIS — R112 Nausea with vomiting, unspecified: Secondary | ICD-10-CM

## 2018-10-10 DIAGNOSIS — I10 Essential (primary) hypertension: Secondary | ICD-10-CM | POA: Diagnosis not present

## 2018-10-10 MED ORDER — MECLIZINE HCL 12.5 MG PO TABS: 12.5000 mg | ORAL_TABLET | Freq: Three times a day (TID) | ORAL | 0 refills | Status: DC | PRN

## 2018-10-10 MED ORDER — ONDANSETRON HCL 4 MG PO TABS: ORAL_TABLET | ORAL | 0 refills | Status: DC

## 2018-10-10 NOTE — Assessment & Plan Note (Signed)
Patient with myalgias secondary to starting pravastatin.  Patient has tried every other day dosing but this is not helped.  We will plan to check CK today to ensure no muscle damage.  Will allow a short break of time between starting another statin.  After symptoms resolve can initiate either fluvastatin or pitavastatin as patient unable to tolerate pravastatin at every other day dosing.  Follow-up in 3 months.  Strict return precautions given.  Advised patient to call if she has any myalgia with new statin therapy.

## 2018-10-10 NOTE — Assessment & Plan Note (Signed)
Within goal today.  We will continue lisinopril 5 mg as well as had chlorothiazide 25 mg.  Follow-up in 3 months.

## 2018-10-10 NOTE — Patient Instructions (Addendum)
It was a pleasure seeing you today.   Today we discussed your cholesterol and hypertension  For your cholesterol: stop using pravastatin for the time being. We will give your muscles a break and then consider re-starting a different medication vs. Alternative day dosing. I have gotten labs and will call or send a letter with results.   For your blood pressure: continue your medications. Your blood pressure looked good today  CONGRATULATIONS ON BEING CANCER FREE FOR 3 YEARS!  Please follow up in 3 months or sooner if symptoms persist or worsen. Please call the clinic immediately if you have any concerns.   Our clinic's number is [phone]. Please call with questions or concerns.    Thank you,  Caroline More, DO

## 2018-10-11 DIAGNOSIS — H40013 Open angle with borderline findings, low risk, bilateral: Secondary | ICD-10-CM | POA: Diagnosis not present

## 2018-10-11 LAB — LIPID PANEL
Chol/HDL Ratio: 3.7 ratio (ref 0.0–4.4)
Cholesterol, Total: 146 mg/dL (ref 100–199)
HDL: 39 mg/dL — AB (ref >39)
LDL Calculated: 73 mg/dL (ref 0–99)
Triglycerides: 168 mg/dL — ABNORMAL HIGH (ref 0–149)
VLDL CHOLESTEROL CAL: 34 mg/dL (ref 5–40)

## 2018-10-11 LAB — CK: Total CK: 97 U/L (ref 24–173)

## 2018-10-12 ENCOUNTER — Other Ambulatory Visit: Payer: Self-pay | Admitting: Family Medicine

## 2018-10-12 MED ORDER — FLUVASTATIN SODIUM 40 MG PO CAPS: 40.0000 mg | ORAL_CAPSULE | Freq: Every day | ORAL | 3 refills | Status: DC

## 2018-10-13 ENCOUNTER — Telehealth: Payer: Self-pay | Admitting: *Deleted

## 2018-10-13 NOTE — Telephone Encounter (Signed)
Pt calls to ask how long she should wait to take the new cholesterol medication. She states that her muscle cramps have slowed down.  Dr. Tammi Klippel,  I cant tell from the note how long you want her to wait. Boby Eyer, Salome Spotted, CMA

## 2018-10-14 ENCOUNTER — Encounter: Payer: Self-pay | Admitting: Family Medicine

## 2018-10-14 NOTE — Telephone Encounter (Signed)
Patient can statin when muscle cramps have resolved.   Dalphine Handing, PGY-2 [facility] 10/14/2018 10:34 PM

## 2018-10-16 ENCOUNTER — Other Ambulatory Visit: Payer: Self-pay | Admitting: Family Medicine

## 2018-10-18 NOTE — Telephone Encounter (Signed)
Pt informed. Deseree Blount, CMA  

## 2018-10-25 NOTE — Progress Notes (Signed)
Subjective:   Patient ID: Roberta [name]    DOB: 1956/11/23, 62 y.o. female   MRN: [id number]  Roberta [name] is a 62 y.o. female with a history of HTN, AR, asthma, GERD, h/o endometrial cancer, obesity, anemia here for   Swollen glands Patient endorses 4-day history of swollen, painful glands under her chin that make it difficult to talk and painful to swallow, stating that sometimes her drink will spill out of her mouth.  Also states her mouth seems drier.  Declines noticing any other swollen glands or lymph nodes.  Denies difficulties breathing, vision changes.  She has lost 8 pounds since last visit due to difficulty eating.  Endorses URI symptoms last week and reports taking NyQuil.  States her 31-year-old grandson has been sick the past couple of weeks with sneezing, cough, fever.  He is improving with OTC treatment and is up-to-date on vaccines.  Recently had statin discontinued due to diffuse myalgia which has improved.  Does endorse history of chronic knee, shoulder and back pain although states this pain is different, sharp in character.  She has not taken her temperature and does not know if she has had a fever, however she has been sweating profusely at night, soaking through her nightgown.  Also endorses fatigue.  Endorses prior history of endometrial cancer status post hysterectomy in 2016, no other cancer history.  Review of Systems:  Per HPI.  [place], medications and smoking status reviewed.  Objective:   BP 116/80   Pulse 79   Temp 98.9 F (37.2 C) (Oral)   Ht 5\' 6"  (1.676 m)   Wt 240 lb 6.4 oz (109 kg)   SpO2 98%   BMI 38.80 kg/m  Vitals and nursing note reviewed.  General: Obese female, in no acute distress with non-toxic appearance HEENT: normocephalic, atraumatic, moist mucous membranes, oropharynx clear without tonsillar exudate, erythema or enlargement.  No evidence of pharynx irritation or postnasal drip.  Floor of mouth swollen with exquisite tenderness to light  palpation of submandibular and sublingual glands.  Sublingual gland irritated and swollen on exam.  Enlargement of R submandibular gland noted to approximately 3 cm.  No enlargement or tenderness noted to parotid gland. Neck: Tender to palpation along anterior cervical chain, although most noted near mandible.  No anterior or posterior cervical lymphadenopathy.  No lymphadenopathy appreciated supraclavicularly or axillary. CV: regular rate and rhythm without murmurs, rubs, or gallops Lungs: clear to auscultation bilaterally with normal work of breathing Skin: warm, dry, no rashes or lesions  Assessment & Plan:   Localized swelling, mass or lump of neck Most likely salivary gland stone versus infection although afebrile and otherwise well-appearing.  Given exquisite tenderness to palpation, difficulty swallowing and speaking, will obtain CT neck to assess for possible abscess and provide antibiotic.  CT scheduled for tomorrow.  Not likely mumps given the lack of exposure and no enlargement or tenderness to parotid gland.  Encouraged frequent hydration and use of lemon drops to encourage stone passage if present.  Red flags discussed and strict return precautions reviewed.  Advised to follow-up on Monday for assessment of progression.  Orders Placed This Encounter  Procedures  . CT SOFT TISSUE NECK W WO CONTRAST    Patient can leave after scan per provider    Standing Status:   Future    Standing Expiration Date:   01/27/2020    Order Specific Question:   ** REASON FOR EXAM (FREE TEXT)    Answer:   painful mass  below mandible, suspect R submandibular gland or obstruction    Order Specific Question:   If indicated for the ordered procedure, I authorize the administration of contrast media per Radiology protocol    Answer:   Yes    Order Specific Question:   Preferred imaging location?    Answer:   [facility]    Order Specific Question:   Call Results- Best Contact Number?    Answer:    [phone]    Order Specific Question:   Radiology Contrast Protocol - do NOT remove file path    Answer:   \\[email or web address]\CTProtocols.pdf  . CT Soft Tissue Neck W Contrast    Standing Status:   Future    Standing Expiration Date:   01/27/2020    Order Specific Question:   ** REASON FOR EXAM (FREE TEXT)    Answer:   painful mass below mandible, suspect rR submandibular glandor obstruction    Order Specific Question:   If indicated for the ordered procedure, I authorize the administration of contrast media per Radiology protocol    Answer:   Yes    Order Specific Question:   Preferred imaging location?    Answer:   [facility]    Order Specific Question:   Call Results- Best Contact Number?    Answer:   [phone]    Order Specific Question:   Radiology Contrast Protocol - do NOT remove file path    Answer:   \\[email or web address]\[email or web address]\CTProtocols.pdf  . CBC with Differential  . Basic Metabolic Panel   Meds ordered this encounter  Medications  . amoxicillin-clavulanate (AUGMENTIN) 875-125 MG tablet    Sig: Take 1 tablet by mouth 2 (two) times daily for 7 days.    Dispense:  14 tablet    Refill:  0   Precepted with Dr. Owens Shark.  Rory Percy, DO PGY-2, [facility] 10/26/2018 6:14 PM

## 2018-10-26 ENCOUNTER — Ambulatory Visit (INDEPENDENT_AMBULATORY_CARE_PROVIDER_SITE_OTHER): Payer: Medicare Other | Admitting: Family Medicine

## 2018-10-26 ENCOUNTER — Other Ambulatory Visit: Payer: Self-pay

## 2018-10-26 ENCOUNTER — Other Ambulatory Visit: Payer: Self-pay | Admitting: *Deleted

## 2018-10-26 DIAGNOSIS — R221 Localized swelling, mass and lump, neck: Secondary | ICD-10-CM | POA: Insufficient documentation

## 2018-10-26 HISTORY — DX: Localized swelling, mass and lump, neck: R22.1

## 2018-10-26 MED ORDER — AMOXICILLIN-POT CLAVULANATE 875-125 MG PO TABS: 1.0000 | ORAL_TABLET | Freq: Two times a day (BID) | ORAL | 0 refills | Status: AC

## 2018-10-26 NOTE — Telephone Encounter (Signed)
Received a page from [facility] about stat lab values that are required to be called in.  A CBC and BMP were drawn on 11/6, and all values are within normal limits.  We will receive an electronic version of these results in the near future.

## 2018-10-26 NOTE — Assessment & Plan Note (Addendum)
Most likely salivary gland stone versus infection although afebrile and otherwise well-appearing.  Given exquisite tenderness to palpation, difficulty swallowing and speaking, will obtain CT neck to assess for possible abscess and provide antibiotic.  CT scheduled for tomorrow.  Not likely mumps given the lack of exposure and no enlargement or tenderness to parotid gland.  Encouraged frequent hydration and use of lemon drops to encourage stone passage if present.  Red flags discussed and strict return precautions reviewed.  Advised to follow-up on Monday for assessment of progression.

## 2018-10-26 NOTE — Patient Instructions (Addendum)
It was great to see you!  Our plans for today:  - We are getting a CT scan of your neck. - Take the antibiotic for 7 days. - Drink lots of water and eat lemon drops to promote release of a salivary stone, should it be present. - If you develop fevers, difficulty breathing or eating, you should be seen immediately, either at our clinic or at the ED. - Come back Monday for follow up.  Take care and seek immediate care sooner if you develop any concerns.   Dr. [facility]

## 2018-10-26 NOTE — Telephone Encounter (Signed)
Pt states she was told she would be getting a refill of her yeast infection medication today since she was given antibiotics. Roberta [name], Salome Spotted, CMA

## 2018-10-26 NOTE — Telephone Encounter (Signed)
Routed to Dr. Ky Barban, who saw patient today and ordered labs

## 2018-10-27 ENCOUNTER — Ambulatory Visit (HOSPITAL_COMMUNITY): Admission: RE | Admit: 2018-10-27 | Payer: Medicare Other | Source: Ambulatory Visit

## 2018-10-27 ENCOUNTER — Ambulatory Visit (HOSPITAL_COMMUNITY)
Admission: RE | Admit: 2018-10-27 | Discharge: 2018-10-27 | Disposition: A | Discharge status: Home / Self Care | Payer: Medicare Other | Source: Ambulatory Visit | Attending: Family Medicine | Admitting: Family Medicine

## 2018-10-27 ENCOUNTER — Telehealth: Payer: Self-pay | Admitting: Family Medicine

## 2018-10-27 DIAGNOSIS — R221 Localized swelling, mass and lump, neck: Secondary | ICD-10-CM | POA: Diagnosis not present

## 2018-10-27 DIAGNOSIS — K1121 Acute sialoadenitis: Secondary | ICD-10-CM | POA: Diagnosis not present

## 2018-10-27 DIAGNOSIS — K1120 Sialoadenitis, unspecified: Secondary | ICD-10-CM | POA: Diagnosis not present

## 2018-10-27 LAB — CBC WITH DIFFERENTIAL/PLATELET
Basophils Absolute: 0 10*3/uL (ref 0.0–0.2)
Basos: 1 %
EOS (ABSOLUTE): 0.2 10*3/uL (ref 0.0–0.4)
EOS: 3 %
Hematocrit: 39.3 % (ref 34.0–46.6)
Hemoglobin: 12.6 g/dL (ref 11.1–15.9)
IMMATURE GRANS (ABS): 0 10*3/uL (ref 0.0–0.1)
IMMATURE GRANULOCYTES: 0 %
LYMPHS: 47 %
Lymphocytes Absolute: 2.8 10*3/uL (ref 0.7–3.1)
MCH: 25.9 pg — ABNORMAL LOW (ref 26.6–33.0)
MCHC: 32.1 g/dL (ref 31.5–35.7)
MCV: 81 fL (ref 79–97)
MONOS ABS: 0.6 10*3/uL (ref 0.1–0.9)
Monocytes: 10 %
NEUTROS PCT: 39 %
Neutrophils Absolute: 2.3 10*3/uL (ref 1.4–7.0)
PLATELETS: 309 10*3/uL (ref 150–450)
RBC: 4.87 x10E6/uL (ref 3.77–5.28)
RDW: 13.8 % (ref 12.3–15.4)
WBC: 5.9 10*3/uL (ref 3.4–10.8)

## 2018-10-27 LAB — BASIC METABOLIC PANEL
BUN/Creatinine Ratio: 18 (ref 12–28)
BUN: 16 mg/dL (ref 8–27)
CHLORIDE: 98 mmol/L (ref 96–106)
CO2: 25 mmol/L (ref 20–29)
Calcium: 9.3 mg/dL (ref 8.7–10.3)
Creatinine, Ser: 0.89 mg/dL (ref 0.57–1.00)
GFR calc Af Amer: 80 mL/min/{1.73_m2} (ref >59)
GFR calc non Af Amer: 69 mL/min/{1.73_m2} (ref >59)
Glucose: 99 mg/dL (ref 65–99)
POTASSIUM: 3.5 mmol/L (ref 3.5–5.2)
Sodium: 137 mmol/L (ref 134–144)

## 2018-10-27 MED ORDER — IOHEXOL 300 MG/ML  SOLN
75.0000 mL | Freq: Once | INTRAMUSCULAR | Status: AC | PRN
  Administered 2018-10-27: 75 mL via INTRAVENOUS

## 2018-10-27 MED ORDER — SODIUM CHLORIDE (PF) 0.9 % IJ SOLN
INTRAMUSCULAR | Status: AC
  Filled 2018-10-27: qty 50

## 2018-10-27 NOTE — Telephone Encounter (Signed)
Spoke to patient and informed of normal results.

## 2018-10-27 NOTE — Telephone Encounter (Signed)
Spoke with patient regarding lab and CT results. Informed patient about confirmation of stone without evidence of mass or abscess. Labs unrevealing. Encouraged continued conservative management with lemon drops and warm compresses. Red flags and return precautions again discussed. Patient has f/u appointment on Tuesday. If she has not passed stone at that point, would consider referral to ENT for possible sialoendoscopy and possible intervention.  Rory Percy, DO PGY-2, [facility] 10/27/2018 4:07 PM

## 2018-10-31 ENCOUNTER — Ambulatory Visit (INDEPENDENT_AMBULATORY_CARE_PROVIDER_SITE_OTHER): Payer: Medicare Other | Admitting: Family Medicine

## 2018-10-31 ENCOUNTER — Encounter: Payer: Self-pay | Admitting: Family Medicine

## 2018-10-31 ENCOUNTER — Other Ambulatory Visit: Payer: Self-pay

## 2018-10-31 VITALS — BP 112/74 | HR 71 | Temp 99.1°F | Ht 66.0 in | Wt 240.0 lb

## 2018-10-31 DIAGNOSIS — K115 Sialolithiasis: Secondary | ICD-10-CM

## 2018-10-31 NOTE — Progress Notes (Signed)
Subjective:    Roberta [name] is a 62 y.o. female who presents to [facility] today for neck mass:  1.  Neck mass: Patient seen here last week for swollen salivary glands.  Very painful.  Concern was for possible infection.  Patient had CT which showed sialolithiasis.  She was initially started antibiotic but these were stopped.  She was recommended to use sour candy to help evacuate the stone.  Since being seen she has had much improvement.  Pain is still present but is only about a 4 out of 10.  She is taking tramadol intermittently which helps with the pain relief.  She is able to talk and eat now which she was not able to do before.  No trouble with breathing.  No drooling.  No difficulty turning head or neck stiffness/pain.  She hasn't felt a stone evacuate or in her mouth.   No fevers/chills.    ROS as above per HPI.     The following portions of the patient's history were reviewed and updated as appropriate: allergies, current medications, past medical history, family and social history, and problem list. Patient is a nonsmoker.    PMH reviewed.  Past Medical History:  Diagnosis Date  . Abnormal EKG    nonspecific ST and T-wave changes - Followed by Dr.Berry  . ALLERGIC RHINITIS 10/14/2010  . Anemia   . Asthma   . ASTHMA, INTERMITTENT 02/16/2007  . BACK PAIN, CHRONIC 03/25/2009  . CERVICAL RADICULOPATHY 10/16/2009  . DDD (degenerative disc disease), lumbosacral   . Depression   . DEPRESSIVE DISORDER NOT ELSEWHERE CLASSIFIED 03/25/2009  . Endometrial cancer ([place])   . GERD (gastroesophageal reflux disease) 09/09/2011  . Glaucoma    sees optho every 3 months, drops each night  . Hyperlipidemia   . Hypertension   . PONV (postoperative nausea and vomiting)   . Postmenopausal vaginal bleeding 09/24/2014   Past Surgical History:  Procedure Laterality Date  . ANKLE SURGERY    . BIOPSY BREAST     LEFT  . BREAST CYST INCISION AND DRAINAGE     under breast  . BUNIONECTOMY     bilateral  and toe correction  . CARPAL TUNNEL RELEASE Bilateral 2009  . CATARACT SURGERY    . CERVICAL SPINE SURGERY    . CHOLECYSTECTOMY  1980  . DILATATION & CURRETTAGE/HYSTEROSCOPY WITH RESECTOCOPE N/A 07/28/2015   Procedure: DILATATION & CURETTAGE/HYSTEROSCOPY WITH RESECTOCOPE;  Surgeon: Eldred Manges, MD;  Location: [facility];  Service: Gynecology;  Laterality: N/A;  . KNEE ARTHROSCOPY    . ROBOTIC ASSISTED TOTAL HYSTERECTOMY WITH BILATERAL SALPINGO OOPHERECTOMY Bilateral 09/04/2015   Procedure: ROBOTIC ASSISTED HYSTERECTOMY WITH BILATERAL SALPINGO OOPHORECTOMY SENTINAL NODE MAPPING ;  Surgeon: Everitt Amber, MD;  Location: [facility];  Service: Gynecology;  Laterality: Bilateral;  . ROTATOR CUFF REPAIR Right May 2014    Medications reviewed. Current Outpatient Medications  Medication Sig Dispense Refill  . albuterol (PROAIR HFA) 108 (90 Base) MCG/ACT inhaler Inhale 2 puffs into the lungs every 4 (four) hours as needed. 2 Inhaler 11  . albuterol (PROVENTIL) (2.5 MG/3ML) 0.083% nebulizer solution Take 3 mLs (2.5 mg total) by nebulization every 6 (six) hours as needed for wheezing or shortness of breath. 75 mL 1  . amoxicillin-clavulanate (AUGMENTIN) 875-125 MG tablet Take 1 tablet by mouth 2 (two) times daily for 7 days. 14 tablet 0  . cyclobenzaprine (FLEXERIL) 10 MG tablet Take 10 mg daily as needed by mouth for muscle spasms.    Marland Kitchen  dorzolamide-timolol (COSOPT) 22.3-6.8 MG/ML ophthalmic solution Place 1 drop into both eyes 2 (two) times daily. 10 mL 12  . esomeprazole (NEXIUM) 20 MG capsule Take 20 mg daily as needed by mouth (acid reflux).    . fluvastatin (LESCOL) 40 MG capsule Take 1 capsule (40 mg total) by mouth at bedtime. 30 capsule 3  . hydrochlorothiazide (HYDRODIURIL) 25 MG tablet TAKE 1 TABLET(25 MG) BY MOUTH DAILY 90 tablet 0  . lisinopril (PRINIVIL,ZESTRIL) 5 MG tablet TAKE 1 TABLET(5 MG) BY MOUTH DAILY 90 tablet 3  . meclizine (ANTIVERT) 12.5 MG tablet Take 1 tablet (12.5 mg total) by mouth  3 (three) times daily as needed. 90 tablet 0  . Multiple Vitamins-Minerals (HAIR SKIN AND NAILS FORMULA PO) Take 1 tablet 3 (three) times a week by mouth.     . ondansetron (ZOFRAN) 4 MG tablet TAKE 1 TABLET BY MOUTH EVERY 8 HOURS AS NEEDED FOR NAUSEA AND VOMITING 20 tablet 0  . traMADol (ULTRAM) 50 MG tablet Take 50 mg daily as needed by mouth for moderate pain.    . Travoprost, BAK Free, (TRAVATAN) 0.004 % SOLN ophthalmic solution Place 1 drop into both eyes at bedtime. 2.5 mL 3   No current facility-administered medications for this visit.      Objective:   Physical Exam BP 112/74   Pulse 71   Temp 99.1 F (37.3 C) (Oral)   Ht 5\' 6"  (1.676 m)   Wt 240 lb (108.9 kg)   SpO2 96%   BMI 38.74 kg/m  Gen:  Alert, cooperative patient who appears stated age in no acute distress.  Vital signs reviewed.  Talking without difficulty.  This is. Head: [place]/AT.   Eyes:  EOMI, PERRL.   Ears:  External ears WNL, Bilateral TM's normal without retraction, redness or bulging. Nose:  Septum midline  Mouth:  MMM, tonsils non-erythematous, non-edematous.   Neck:  Right salivary submandibular gland still slightly edematous.  No external erythema or neck.  Tender to palpation here.  No other tenderness throughout her neck.  No neck stiffness.  She is able to swallow well without drooling.  Impression/plan: 1.  Cholelithiasis: -Much improved. -Continue as needed pain relief. -Discussed options.  Will place ENT referral today though I suspect this will continue to improve. Can cancel that appt if it does.  -  If improvement stalls she can follow-up with ENT for possible stone removal. - return precautions provided regarding if impaction recurs or infection

## 2018-10-31 NOTE — Patient Instructions (Signed)
It was good to see you today.  I have put in an ENT referral.  If your swollen gland completely goes away, you can cancel this appointment.  If you're still having issues like pain with it, make sure to follow-up with them.  They should call you in the next week.  If you have not heard anything by next week call and ask [place].

## 2018-11-15 DIAGNOSIS — R3121 Asymptomatic microscopic hematuria: Secondary | ICD-10-CM | POA: Diagnosis not present

## 2018-11-15 DIAGNOSIS — M545 Low back pain: Secondary | ICD-10-CM | POA: Diagnosis not present

## 2018-11-22 ENCOUNTER — Encounter: Payer: Self-pay | Admitting: Family Medicine

## 2018-11-23 DIAGNOSIS — K115 Sialolithiasis: Secondary | ICD-10-CM | POA: Diagnosis not present

## 2018-11-25 DIAGNOSIS — M1611 Unilateral primary osteoarthritis, right hip: Secondary | ICD-10-CM | POA: Diagnosis not present

## 2018-11-25 DIAGNOSIS — M1711 Unilateral primary osteoarthritis, right knee: Secondary | ICD-10-CM | POA: Diagnosis not present

## 2018-12-15 DIAGNOSIS — K115 Sialolithiasis: Secondary | ICD-10-CM | POA: Diagnosis not present

## 2018-12-23 ENCOUNTER — Emergency Department (HOSPITAL_COMMUNITY)
Admission: EM | Admit: 2018-12-23 | Discharge: 2018-12-23 | Disposition: A | Discharge status: Home / Self Care | Payer: Medicare Other | Attending: Emergency Medicine | Admitting: Emergency Medicine

## 2018-12-23 ENCOUNTER — Other Ambulatory Visit: Payer: Self-pay

## 2018-12-23 ENCOUNTER — Emergency Department (HOSPITAL_COMMUNITY): Payer: Medicare Other

## 2018-12-23 ENCOUNTER — Encounter (HOSPITAL_COMMUNITY): Payer: Self-pay

## 2018-12-23 DIAGNOSIS — Y939 Activity, unspecified: Secondary | ICD-10-CM | POA: Diagnosis not present

## 2018-12-23 DIAGNOSIS — Y92410 Unspecified street and highway as the place of occurrence of the external cause: Secondary | ICD-10-CM | POA: Diagnosis not present

## 2018-12-23 DIAGNOSIS — Z79899 Other long term (current) drug therapy: Secondary | ICD-10-CM | POA: Diagnosis not present

## 2018-12-23 DIAGNOSIS — M25562 Pain in left knee: Secondary | ICD-10-CM | POA: Diagnosis not present

## 2018-12-23 DIAGNOSIS — I10 Essential (primary) hypertension: Secondary | ICD-10-CM | POA: Insufficient documentation

## 2018-12-23 DIAGNOSIS — G8929 Other chronic pain: Secondary | ICD-10-CM | POA: Diagnosis not present

## 2018-12-23 DIAGNOSIS — J45909 Unspecified asthma, uncomplicated: Secondary | ICD-10-CM | POA: Insufficient documentation

## 2018-12-23 DIAGNOSIS — S8012XA Contusion of left lower leg, initial encounter: Secondary | ICD-10-CM

## 2018-12-23 DIAGNOSIS — Y999 Unspecified external cause status: Secondary | ICD-10-CM | POA: Diagnosis not present

## 2018-12-23 DIAGNOSIS — S3992XA Unspecified injury of lower back, initial encounter: Secondary | ICD-10-CM | POA: Diagnosis not present

## 2018-12-23 DIAGNOSIS — R52 Pain, unspecified: Secondary | ICD-10-CM | POA: Diagnosis not present

## 2018-12-23 DIAGNOSIS — S8992XA Unspecified injury of left lower leg, initial encounter: Secondary | ICD-10-CM | POA: Diagnosis not present

## 2018-12-23 DIAGNOSIS — S8012XD Contusion of left lower leg, subsequent encounter: Secondary | ICD-10-CM | POA: Diagnosis not present

## 2018-12-23 DIAGNOSIS — M79662 Pain in left lower leg: Secondary | ICD-10-CM | POA: Diagnosis not present

## 2018-12-23 DIAGNOSIS — M545 Low back pain: Secondary | ICD-10-CM | POA: Insufficient documentation

## 2018-12-23 MED ORDER — CYCLOBENZAPRINE HCL 10 MG PO TABS
10.0000 mg | ORAL_TABLET | Freq: Once | ORAL | Status: AC
  Administered 2018-12-23: 10 mg via ORAL
  Filled 2018-12-23: qty 1

## 2018-12-23 MED ORDER — TRAMADOL HCL 50 MG PO TABS
50.0000 mg | ORAL_TABLET | Freq: Once | ORAL | Status: AC
  Administered 2018-12-23: 50 mg via ORAL
  Filled 2018-12-23: qty 1
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
## 2019-01-02 ENCOUNTER — Other Ambulatory Visit: Payer: Self-pay

## 2019-01-02 DIAGNOSIS — H40013 Open angle with borderline findings, low risk, bilateral: Secondary | ICD-10-CM | POA: Diagnosis not present

## 2019-01-02 NOTE — Patient Outreach (Signed)
[facility]) Care Management  01/02/2019  Roberta [name] June 07, 1956 [id number]  TELEPHONE SCREENING Referral date: 12/28/18 Referral source: utilization management Referral reason: transportation assistance.  Insurance: [employer] Attempt #1  Telephone call to patient regarding utilization management referral. Unable to reach patient. Attempted call to listed home and mobile number. Unable to reach. HIPAA compliant voice message left with call back phone number.   PLAN: RNCM will attempt 2nd telephone call to patient within 4 business days.  RNCM will send patient outreach letter to attempt contact  Quinn Plowman RN,BSN,CCM [facility] Telephonic  [phone]

## 2019-01-03 ENCOUNTER — Ambulatory Visit (INDEPENDENT_AMBULATORY_CARE_PROVIDER_SITE_OTHER): Payer: Medicare Other | Admitting: Family Medicine

## 2019-01-03 ENCOUNTER — Other Ambulatory Visit: Payer: Self-pay

## 2019-01-03 ENCOUNTER — Encounter: Payer: Self-pay | Admitting: Family Medicine

## 2019-01-03 VITALS — BP 130/80 | HR 84 | Temp 98.8°F | Ht 66.0 in | Wt 248.5 lb

## 2019-01-03 DIAGNOSIS — I10 Essential (primary) hypertension: Secondary | ICD-10-CM

## 2019-01-03 MED ORDER — HYDROCHLOROTHIAZIDE 25 MG PO TABS: ORAL_TABLET | ORAL | 0 refills | Status: DC
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
## 2019-01-03 NOTE — Patient Instructions (Signed)
I am glad you came in today so we could discuss your headache more thoroughly.  It sounds like they are at 3 separate problems that we discussed today.  1-tension headache, mild, not particularly bothersome 2-shoulder pain, made worse by lifting up her left arm. 3-weakness/woozy feeling associated with pain from your left arm  Overall, I am not worried about bleeding in your brain.  At this time, I do not think that there is any reason to get head imaging, like a head CT.  It sounds like the sensation/pain that you are concerned about only happens with the use of your left shoulder.  I think that it is very likely that you are having what is called a vasovagal response to your shoulder pain.  At this time, I think the most appropriate treatment is further work-up of your shoulder.  I would like you to continue taking Tylenol, tramadol, Flexeril as needed for your tension headache and shoulder pain.  Please also make a follow-up appointment to discuss your history of depression more thoroughly.

## 2019-01-03 NOTE — Patient Outreach (Signed)
[place]) Care Management  01/03/2019  Roberta [name] 12-08-1956 [id number]  TELEPHONE SCREENING Referral date: 12/28/18 Referral source: utilization management referral Referral reason: transportation assistance Insurance: united health care  Attempt #2  Telephone call to patient regarding utilization management referral. Attempted call to listed home and mobile number. Unable to reach.  HIPAA compliant voice messages left with call back phone number.   PLAN: RNCM will attempt 3rd telephone outreach to patient within 4 business days.   Quinn Plowman RN,BSN,CCM [facility] Telephonic  [phone]

## 2019-01-04 ENCOUNTER — Other Ambulatory Visit: Payer: Self-pay

## 2019-01-04 DIAGNOSIS — M79671 Pain in right foot: Secondary | ICD-10-CM | POA: Diagnosis not present

## 2019-01-04 DIAGNOSIS — M542 Cervicalgia: Secondary | ICD-10-CM | POA: Diagnosis not present

## 2019-01-04 DIAGNOSIS — M545 Low back pain: Secondary | ICD-10-CM | POA: Diagnosis not present

## 2019-01-04 DIAGNOSIS — M25512 Pain in left shoulder: Secondary | ICD-10-CM | POA: Diagnosis not present
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
## 2019-01-05 ENCOUNTER — Other Ambulatory Visit: Payer: Self-pay

## 2019-01-05 NOTE — Patient Outreach (Signed)
[place] [facility] [facility]) Care Management  01/05/2019  Roberta [name] 06-08-1956 [id number]  Initial outreach regarding social work referral for transportation resources.  [name] previously applied for SCAT services but was denied.  [name] has transportation benefits with Medicaid but prefers not to use the service.  BSW contacted [employer] and confirmed that she has transportation benefits through [employer].  BSW informed [name] of this and provided her with contact information.  BSW also agreed to mail more information about [employer] service. BSW educated her about Armed forces technical officer through [employer] [facility] as this can be a Theatre manager once [employer] rides are exhausted.  BSW agreed to mal [employer] information/application.  BSW will follow up within the next two weeks to ensure receipt.  Ronn Melena, BSW Social Worker [phone]

## 2019-01-11 ENCOUNTER — Encounter: Payer: Self-pay | Admitting: Family Medicine

## 2019-01-11 DIAGNOSIS — M67912 Unspecified disorder of synovium and tendon, left shoulder: Secondary | ICD-10-CM | POA: Diagnosis not present

## 2019-01-11 DIAGNOSIS — M25512 Pain in left shoulder: Secondary | ICD-10-CM | POA: Diagnosis not present

## 2019-01-12 ENCOUNTER — Other Ambulatory Visit: Payer: Self-pay

## 2019-01-12 NOTE — Patient Outreach (Signed)
[facility]) Care Management  01/12/2019  SHERLE MELLO 30-Nov-1956 [id number]   Follow up call to Ms. Hemminger to ensure receipt of [employer] application mailed on 7/89/78.  Ms. Marsico said that she has a lot of mail to go through so she was unsure if she has received application.  She said that she would call BSW if she has not.   Closing case at this time but will resend application if contacted by [name].   Ronn Melena, BSW Social Worker [phone]

## 2019-02-01 DIAGNOSIS — M67912 Unspecified disorder of synovium and tendon, left shoulder: Secondary | ICD-10-CM | POA: Diagnosis not present

## 2019-02-09 DIAGNOSIS — M25512 Pain in left shoulder: Secondary | ICD-10-CM | POA: Diagnosis not present

## 2019-02-19 DIAGNOSIS — M67912 Unspecified disorder of synovium and tendon, left shoulder: Secondary | ICD-10-CM | POA: Diagnosis not present

## 2019-02-19 DIAGNOSIS — M19012 Primary osteoarthritis, left shoulder: Secondary | ICD-10-CM | POA: Diagnosis not present

## 2019-02-20 DIAGNOSIS — M533 Sacrococcygeal disorders, not elsewhere classified: Secondary | ICD-10-CM | POA: Diagnosis not present

## 2019-02-23 ENCOUNTER — Telehealth: Payer: Self-pay | Admitting: *Deleted

## 2019-02-23 NOTE — Telephone Encounter (Signed)
Returned the patient's call and scheduled her to see Dr. Denman George in April.

## 2019-02-27 ENCOUNTER — Telehealth: Payer: Self-pay | Admitting: *Deleted

## 2019-02-27 NOTE — Telephone Encounter (Signed)
Called and left Cecille Rubin at [facility] for her last office note.

## 2019-03-01 ENCOUNTER — Encounter: Payer: Self-pay | Admitting: Gynecologic Oncology

## 2019-03-08 ENCOUNTER — Telehealth: Payer: Self-pay | Admitting: Family Medicine

## 2019-03-08 DIAGNOSIS — I10 Essential (primary) hypertension: Secondary | ICD-10-CM

## 2019-03-08 NOTE — Telephone Encounter (Signed)
Due to ongoing concerns with social distancing in light of COVID-19 cases, I called this patient to offer options for their visit today. Their visit was listed for HTN f/u.   I offered to discuss their care via phone and prescribe medications and order labs as needed, in order to spare them potential exposure to the community by coming to our office. They elected to have phone appointment today and schedule in person f/u in 1 month.   HPI  Patient last seen for HTN on 09/2018. At that time patient within goal as was instructed to continue lisinopril 5mg  and HCTZ 25mg . Per results flow sheet patient with recent BP all within goal (some hypotensive in 2018). BP on 12/23/18 and 01/03/19 within goal. States that she recently went to chronic pain clinic and was hypotensive there. Saw chronic pain and BP 94/64 but was taken on wrist. Patient states she was asymptomatic. States that she has changed some of her medications though, as she was feeling fatigued. States she now takes lisinopril 5mg  every other day. Is still taking HCTZ daily. States symptoms of fatigue resolved after changed BP medications. Patient is now avoiding going out. Has BP cuff at home.   Hypertension: - Medications: lisinopril 5mg , HCTZ 25mg  - Compliance: only taking lisinopril 5mg  every other day  - Checking BP at home: no, has BP cuff at home  - Denies any SOB, CP, vision changes, LE edema, medication SEs, or symptoms of hypotension - Diet: watching what she eats. Eating out less because of social distancing.  - Exercise: now getting more exercise after seeing chronic pain.     HYPERTENSION, BENIGN SYSTEMIC Concern that patient may be hypotensive on current medication regimen given hypotensive reading at chronic pain clinic (although asymptomatic) and patient improved fatigue when decreasing BP medications at home. Advised that patient Stop HCTZ. Continue taking lisinopril 5mg  for renal protection. Patient to take BP daily at home x1  week and send mychart message with readings. If patient is normotensive on new regiment, will continue this. If patient hypertensive can start low dose HCTZ back. Patient agreeable with this plan. Appointment made for 4/17 @1 :30PM per patient preference. Strict return precautions given. Patient stated she has 90 day supply of all her medications so no refills needed at this time.    No orders of the defined types were placed in this encounter.   I counseled the patient that we are here for them during this trying time. I asked that they reach out via phone with any concerns regarding these problems or others that may come up. They were particularly counseled to call with concerns for cough, SOB, or fever.   Routing to Preceptor in accordance with our procedure. This is a no charge visit.   Caroline More, DO  PGY-2

## 2019-03-08 NOTE — Assessment & Plan Note (Addendum)
Concern that patient may be hypotensive on current medication regimen given hypotensive reading at chronic pain clinic (although asymptomatic) and patient improved fatigue when decreasing BP medications at home. Advised that patient Stop HCTZ. Continue taking lisinopril 5mg  for renal protection. Patient to take BP daily at home x1 week and send mychart message with readings. If patient is normotensive on new regiment, will continue this. If patient hypertensive can start low dose HCTZ back. Patient agreeable with this plan. Appointment made for 4/17 @1 :30PM per patient preference. Strict return precautions given. Patient stated she has 90 day supply of all her medications so no refills needed at this time.

## 2019-03-09 ENCOUNTER — Ambulatory Visit: Payer: Medicare Other | Admitting: Family Medicine

## 2019-03-09 ENCOUNTER — Other Ambulatory Visit: Payer: Self-pay | Admitting: Family Medicine

## 2019-03-09 DIAGNOSIS — R112 Nausea with vomiting, unspecified: Secondary | ICD-10-CM

## 2019-03-09 MED ORDER — MECLIZINE HCL 12.5 MG PO TABS: 12.5000 mg | ORAL_TABLET | Freq: Three times a day (TID) | ORAL | 0 refills | Status: DC | PRN

## 2019-03-09 MED ORDER — ONDANSETRON HCL 4 MG PO TABS: ORAL_TABLET | ORAL | 0 refills | Status: DC

## 2019-03-15 ENCOUNTER — Telehealth: Payer: Self-pay | Admitting: *Deleted

## 2019-03-15 NOTE — Telephone Encounter (Signed)
Called and spoke with the patient regarding her appt on 4/15. Moved the appt to 5/8 due to COVID-19. Explained that she may receive a call the day before to be pre-screen and than again the day of the appt at the front. Also explained the no visitor policy.

## 2019-03-16 ENCOUNTER — Other Ambulatory Visit: Payer: Self-pay | Admitting: Family Medicine

## 2019-03-16 ENCOUNTER — Encounter: Payer: Self-pay | Admitting: Family Medicine

## 2019-03-16 NOTE — Progress Notes (Signed)
Patient remained hypotensive while on lisinopril 5mg . Recommended discontinuing medication and continue to check blood pressure to ensure no further hypotensive events and to ensure she does not get hypertensive.   Dalphine Handing, PGY-2 [facility] 03/16/2019 2:49 PM

## 2019-04-04 ENCOUNTER — Ambulatory Visit: Payer: Medicare Other | Admitting: Gynecologic Oncology

## 2019-04-06 ENCOUNTER — Encounter: Payer: Self-pay | Admitting: Family Medicine

## 2019-04-06 ENCOUNTER — Ambulatory Visit: Payer: Medicare Other | Admitting: Family Medicine

## 2019-04-08 ENCOUNTER — Emergency Department (HOSPITAL_COMMUNITY)
Admission: EM | Admit: 2019-04-08 | Discharge: 2019-04-09 | Disposition: A | Discharge status: Home / Self Care | Payer: Medicare Other | Attending: Emergency Medicine | Admitting: Emergency Medicine

## 2019-04-08 ENCOUNTER — Encounter (HOSPITAL_COMMUNITY): Payer: Self-pay | Admitting: *Deleted

## 2019-04-08 ENCOUNTER — Other Ambulatory Visit: Payer: Self-pay

## 2019-04-08 DIAGNOSIS — Z23 Encounter for immunization: Secondary | ICD-10-CM | POA: Insufficient documentation

## 2019-04-08 DIAGNOSIS — I10 Essential (primary) hypertension: Secondary | ICD-10-CM | POA: Diagnosis not present

## 2019-04-08 DIAGNOSIS — Y9389 Activity, other specified: Secondary | ICD-10-CM | POA: Diagnosis not present

## 2019-04-08 DIAGNOSIS — S61412A Laceration without foreign body of left hand, initial encounter: Secondary | ICD-10-CM | POA: Diagnosis not present

## 2019-04-08 DIAGNOSIS — Y999 Unspecified external cause status: Secondary | ICD-10-CM | POA: Insufficient documentation

## 2019-04-08 DIAGNOSIS — Y929 Unspecified place or not applicable: Secondary | ICD-10-CM | POA: Insufficient documentation

## 2019-04-08 DIAGNOSIS — W230XXA Caught, crushed, jammed, or pinched between moving objects, initial encounter: Secondary | ICD-10-CM | POA: Diagnosis not present

## 2019-04-08 DIAGNOSIS — Z79899 Other long term (current) drug therapy: Secondary | ICD-10-CM | POA: Insufficient documentation

## 2019-04-08 NOTE — ED Triage Notes (Signed)
Pt reports she cut the back of her hand on a nail. Lac to the back of the hand, bleeding controlled

## 2019-04-09 DIAGNOSIS — S61412A Laceration without foreign body of left hand, initial encounter: Secondary | ICD-10-CM | POA: Diagnosis not present

## 2019-04-09 MED ORDER — LIDOCAINE-EPINEPHRINE 2 %-1:100000 IJ SOLN
20.0000 mL | Freq: Once | INTRAMUSCULAR | Status: DC
  Filled 2019-04-09: qty 20

## 2019-04-09 MED ORDER — TETANUS-DIPHTH-ACELL PERTUSSIS 5-2.5-18.5 LF-MCG/0.5 IM SUSP
0.5000 mL | Freq: Once | INTRAMUSCULAR | Status: AC
  Administered 2019-04-09: 0.5 mL via INTRAMUSCULAR
  Filled 2019-04-09: qty 0.5

## 2019-04-09 MED ORDER — LIDOCAINE-EPINEPHRINE (PF) 2 %-1:200000 IJ SOLN
INTRAMUSCULAR | Status: AC
  Administered 2019-04-09: 01:00:00
  Filled 2019-04-09: qty 20

## 2019-04-09 NOTE — Discharge Instructions (Addendum)
Sutures can be removed in 7-10 days by your doctor. Go sooner with any sign of infection. Tylenol and/or ibuprofen as needed for pain.

## 2019-04-09 NOTE — ED Provider Notes (Signed)
[place] EMERGENCY DEPARTMENT Provider Note   CSN: [id number] Arrival date & time: 04/08/19  2343    History   Chief Complaint Chief Complaint  Patient presents with  . Laceration    HPI Roberta [name] is a 63 y.o. female.     Patient to ED with laceration to left dorsal hand. She was moving furniture in her home when a folding chair collapsed causing laceration. No other injury. Unknown tetanus status.   The history is provided by the patient. No language interpreter was used.  Laceration    Past Medical History:  Diagnosis Date  . Abnormal EKG    nonspecific ST and T-wave changes - Followed by Dr.Berry  . ALLERGIC RHINITIS 10/14/2010  . Anemia   . Asthma   . ASTHMA, INTERMITTENT 02/16/2007  . BACK PAIN, CHRONIC 03/25/2009  . CERVICAL RADICULOPATHY 10/16/2009  . DDD (degenerative disc disease), lumbosacral   . Depression   . DEPRESSIVE DISORDER NOT ELSEWHERE CLASSIFIED 03/25/2009  . Endometrial cancer ([place])   . GERD (gastroesophageal reflux disease) 09/09/2011  . Glaucoma    sees optho every 3 months, drops each night  . Hyperlipidemia   . Hypertension   . PONV (postoperative nausea and vomiting)   . Postmenopausal vaginal bleeding 09/24/2014    Patient Active Problem List   Diagnosis Date Noted  . Localized swelling, mass or lump of neck 10/26/2018  . Neuropathy 04/18/2018  . Pineal gland cyst 10/01/2015  . Exophthalmos 10/01/2015  . Endometrial cancer ([place]) 08/11/2015  . Postmenopausal bleeding 07/28/2015  . Atypical endometrial hyperplasia 07/28/2015  . Abnormal EKG 07/21/2015  . Benign paroxysmal positional vertigo 09/24/2014  . Healthcare maintenance 04/24/2014  . Uterine fibroid 01/04/2014  . Cervical disc disorder with radiculopathy of cervical region 10/24/2013  . Glaucoma   . GERD (gastroesophageal reflux disease) 09/09/2011  . Gastric polyp 09/09/2011  . ALLERGIC RHINITIS 10/14/2010  . BREAST MASS, BENIGN 02/04/2010  .  BACK PAIN, CHRONIC 03/25/2009  . HYPERLIPIDEMIA 02/16/2007  . OBESITY, NOS 02/16/2007  . ANEMIA, OTHER, UNSPECIFIED 02/16/2007  . HYPERTENSION, BENIGN SYSTEMIC 02/16/2007  . Asthma 02/16/2007    Past Surgical History:  Procedure Laterality Date  . ANKLE SURGERY    . BIOPSY BREAST     LEFT  . BREAST CYST INCISION AND DRAINAGE     under breast  . BUNIONECTOMY     bilateral and toe correction  . CARPAL TUNNEL RELEASE Bilateral 2009  . CATARACT SURGERY    . CERVICAL SPINE SURGERY    . CHOLECYSTECTOMY  1980  . DILATATION & CURRETTAGE/HYSTEROSCOPY WITH RESECTOCOPE N/A 07/28/2015   Procedure: DILATATION & CURETTAGE/HYSTEROSCOPY WITH RESECTOCOPE;  Surgeon: Eldred Manges, MD;  Location: [facility];  Service: Gynecology;  Laterality: N/A;  . KNEE ARTHROSCOPY    . ROBOTIC ASSISTED TOTAL HYSTERECTOMY WITH BILATERAL SALPINGO OOPHERECTOMY Bilateral 09/04/2015   Procedure: ROBOTIC ASSISTED HYSTERECTOMY WITH BILATERAL SALPINGO OOPHORECTOMY SENTINAL NODE MAPPING ;  Surgeon: Everitt Amber, MD;  Location: [facility];  Service: Gynecology;  Laterality: Bilateral;  . ROTATOR CUFF REPAIR Right May 2014     OB History    Gravida  4   Para  3   Term  3   Preterm      AB      Living  4     SAB      TAB      Ectopic      Multiple  0   Live Births  4  Home Medications    Prior to Admission medications   Medication Sig Start Date End Date Taking? Authorizing Provider  albuterol (PROAIR HFA) 108 (90 Base) MCG/ACT inhaler Inhale 2 puffs into the lungs every 4 (four) hours as needed. 09/28/17   Rogue Bussing, MD  albuterol (PROVENTIL) (2.5 MG/3ML) 0.083% nebulizer solution Take 3 mLs (2.5 mg total) by nebulization every 6 (six) hours as needed for wheezing or shortness of breath. 01/28/17   Eloise Levels, MD  cyclobenzaprine (FLEXERIL) 10 MG tablet Take 10 mg daily as needed by mouth for muscle spasms.    [provider]  dorzolamide-timolol (COSOPT) 22.3-6.8  MG/ML ophthalmic solution Place 1 drop into both eyes 2 (two) times daily. 10/05/17   Eloise Levels, MD  esomeprazole (NEXIUM) 20 MG capsule Take 20 mg daily as needed by mouth (acid reflux).    [provider]  fluvastatin (LESCOL) 40 MG capsule Take 1 capsule (40 mg total) by mouth at bedtime. 10/12/18   Caroline More, DO  meclizine (ANTIVERT) 12.5 MG tablet Take 1 tablet (12.5 mg total) by mouth 3 (three) times daily as needed. 03/09/19   Caroline More, DO  Multiple Vitamins-Minerals (HAIR SKIN AND NAILS FORMULA PO) Take 1 tablet 3 (three) times a week by mouth.     [provider]  ondansetron (ZOFRAN) 4 MG tablet TAKE 1 TABLET BY MOUTH EVERY 8 HOURS AS NEEDED FOR NAUSEA AND VOMITING 03/09/19   Caroline More, DO  traMADol (ULTRAM) 50 MG tablet Take 50 mg daily as needed by mouth for moderate pain.    [provider]  Travoprost, BAK Free, (TRAVATAN) 0.004 % SOLN ophthalmic solution Place 1 drop into both eyes at bedtime. 10/23/14   Bernadene Bell, MD    Family History Family History  Problem Relation Age of Onset  . Heart disease Mother        MI in 109s  . Lymphoma Mother        related to asbestos  . Cancer Mother        lymphoma (asbestos exposure)  . Alcoholism Father   . Cirrhosis Father        due to alcohol  . Asthma Father   . Cancer Maternal Uncle        lung  . Cancer Maternal Uncle        lung    Social History Social History   Tobacco Use  . Smoking status: Never Smoker  . Smokeless tobacco: Never Used  Substance Use Topics  . Alcohol use: No    Alcohol/week: 0.0 standard drinks  . Drug use: No     Allergies   Influenza vaccines; Budesonide; Flonase [fluticasone propionate]; Codeine; Morphine and related; and Nickel   Review of Systems Review of Systems  Constitutional: Negative for diaphoresis.  Gastrointestinal: Negative for nausea.  Musculoskeletal:       See HPI.  Skin: Positive for wound.  Neurological:  Negative for numbness.     Physical Exam Updated Vital Signs BP 135/89 (BP Location: Right Arm)   Pulse 97   Temp 98.9 F (37.2 C) (Oral)   Resp 16   SpO2 98%   Physical Exam Constitutional:      Appearance: She is well-developed.  Neck:     Musculoskeletal: Normal range of motion.  Pulmonary:     Effort: Pulmonary effort is normal.  Musculoskeletal:     Comments: FROM all digits of left hand without deficit.  Skin:  General: Skin is warm and dry.     Comments: 3.5 cm linear laceration to dorsum of left hand overlying 3rd MT. No exposed tendon or bone. There is active bleeding.  Neurological:     Mental Status: She is alert and oriented to person, place, and time.      ED Treatments / Results  Labs (all labs ordered are listed, but only abnormal results are displayed) Labs Reviewed - No data to display  EKG None  Radiology No results found.  Procedures .Marland KitchenLaceration Repair Date/Time: 04/09/2019 2:21 AM Performed by: Charlann Lange, PA-C Authorized by: Charlann Lange, PA-C   Consent:    Consent obtained:  Verbal   Consent given by:  Patient Anesthesia (see MAR for exact dosages):    Anesthesia method:  Local infiltration   Local anesthetic:  Lidocaine 2% WITH epi Laceration details:    Location:  Hand   Hand location:  L hand, dorsum   Length (cm):  3.5 Repair type:    Repair type:  Simple Pre-procedure details:    Preparation:  Patient was prepped and draped in usual sterile fashion Exploration:    Hemostasis achieved with:  Epinephrine   Wound exploration: wound explored through full range of motion and entire depth of wound probed and visualized     Wound extent: no foreign bodies/material noted     Contaminated: no   Treatment:    Area cleansed with:  Betadine and saline Skin repair:    Repair method:  Sutures   Suture size:  4-0   Suture material:  Prolene   Suture technique:  Simple interrupted   Number of sutures:  7 Approximation:     Approximation:  Close Post-procedure details:    Patient tolerance of procedure:  Tolerated well, no immediate complications   (including critical care time)  Medications Ordered in ED Medications  lidocaine-EPINEPHrine (XYLOCAINE W/EPI) 2 %-1:100000 (with pres) injection 20 mL (has no administration in time range)  Tdap (BOOSTRIX) injection 0.5 mL (0.5 mLs Intramuscular Given 04/09/19 0038)  lidocaine-EPINEPHrine (XYLOCAINE W/EPI) 2 %-1:200000 (PF) injection (  Given by Other 04/09/19 0038)     Initial Impression / Assessment and Plan / ED Course  I have reviewed the triage vital signs and the nursing notes.  Pertinent labs & imaging results that were available during my care of the patient were reviewed by me and considered in my medical decision making (see chart for details).        Patient to ED with uncomplicated laceration repaired as per above note.   Final Clinical Impressions(s) / ED Diagnoses   Final diagnoses:  Laceration of left hand without foreign body, initial encounter    ED Discharge Orders    None       Dennie Bible 04/09/19 [place] Newcomer, April, MD 04/09/19 5035

## 2019-04-19 ENCOUNTER — Other Ambulatory Visit: Payer: Self-pay

## 2019-04-19 ENCOUNTER — Ambulatory Visit (INDEPENDENT_AMBULATORY_CARE_PROVIDER_SITE_OTHER): Payer: Medicare Other | Admitting: Family Medicine

## 2019-04-19 ENCOUNTER — Telehealth: Payer: Self-pay | Admitting: *Deleted

## 2019-04-19 VITALS — BP 130/84 | HR 84

## 2019-04-19 DIAGNOSIS — Z4802 Encounter for removal of sutures: Secondary | ICD-10-CM | POA: Diagnosis not present

## 2019-04-19 DIAGNOSIS — I10 Essential (primary) hypertension: Secondary | ICD-10-CM

## 2019-04-19 NOTE — Telephone Encounter (Signed)
Called and spoke with the patient regarding her appt for 5/8. Changed the app to a phone visit

## 2019-04-19 NOTE — Progress Notes (Signed)
     Subjective: Chief Complaint  Patient presents with  . stiches    removed     HPI: Roberta [name] is a 63 y.o. presenting to clinic today to discuss the following:  Removal of Sutures Patient cut her hand on a nail while trying to fix a lawn chair on 4/19. She had 8 single simple sutures placed on the dorsal side of the hand just medial to the thumb. No signs of bleeding, erythema, swelling, or discharge. Patient states she is in no pain and feels well. Appears to be healing well.  BP Patient brought in her BP cuff from home. She has a wrist cuff that closely matches what we measured her BP to be here in the office. Her arm cuff cannot go above her elbow and thus I feel the measurements are inaccurate. I advised her to see if she can obtain a larger cuff from the manufacturer. If so, the arm cuff is preferable over the wrist cuff. However, if a bigger cuff is not available then the wrist cuff for home use is fine. BP is well controlled here in the office so no need for medication adjustment at this time.  ROS noted in HPI.   Past Medical, Surgical, Social, and Family History Reviewed & Updated per EMR.   Pertinent Historical Findings include:   Social History   Tobacco Use  Smoking Status Never Smoker  Smokeless Tobacco Never Used   Objective: BP 130/84   Pulse 84   SpO2 96%  Vitals and nursing notes reviewed  Physical Exam Gen: Alert and Oriented x 3, NAD HEENT: Normocephalic, atraumatic MSK: Hand: Well healing laceration, 8 non-reabsorbable sutures in simple stitch pattern removed, no erythema, no swelling, no discharge, no bleeding. Steri-strips applied and dressed with bandage. Ext: no clubbing, cyanosis, or edema Skin: warm, dry, intact, no rashes  Assessment/Plan:  Visit for suture removal Suture successfully removed. Using sterile suture removal tools. Area cleaned with iodine and alcohol prep wipes. Applied 3 steri-strips to the incision site to help keep  skin margins in close proximity to limit scar tissue. Area re-dressed with bandage and 4x4 gauze. - Return as needed  HYPERTENSION, BENIGN SYSTEMIC BP well controlled at visit. Her home cuff and our readings are matching closely with her wrist BP cuff. Her arm cuff does not go above her elbow as it is not big enough. - BP well controlled at today's visit, cont current meds - Advised to get a larger size for her arm BP cuff and use that at home if she is able. - Ok to use her wrist cuff at home if she cannot obtain a larger cuff size for her arm.   PATIENT EDUCATION PROVIDED: See AVS    Diagnosis and plan along with any newly prescribed medication(s) were discussed in detail with this patient today. The patient verbalized understanding and agreed with the plan. Patient advised if symptoms worsen return to clinic or ER.     Harolyn Rutherford, DO 04/19/2019, 11:13 AM PGY-2 [place]

## 2019-04-19 NOTE — Assessment & Plan Note (Signed)
BP well controlled at visit. Her home cuff and our readings are matching closely with her wrist BP cuff. Her arm cuff does not go above her elbow as it is not big enough. - BP well controlled at today's visit, cont current meds - Advised to get a larger size for her arm BP cuff and use that at home if she is able. - Ok to use her wrist cuff at home if she cannot obtain a larger cuff size for her arm.

## 2019-04-19 NOTE — Patient Instructions (Signed)
It was great to meet you today! Thank you for letting me participate in your care!  Today, we discussed your recent cut on your hand to shows no signs of infection and appears to be healing very well. Please keep the steri-strips on for the next 2-3 days and keep the area dry and clean. You can remove them after 2-3 days.  Please call if you begin develop a fever, have increased pain, or notice any bad smelling dark colored drainage from the area.  Be well, Harolyn Rutherford, DO PGY-2, [facility]

## 2019-04-19 NOTE — Assessment & Plan Note (Signed)
Suture successfully removed. Using sterile suture removal tools. Area cleaned with iodine and alcohol prep wipes. Applied 3 steri-strips to the incision site to help keep skin margins in close proximity to limit scar tissue. Area re-dressed with bandage and 4x4 gauze. - Return as needed

## 2019-04-27 ENCOUNTER — Inpatient Hospital Stay: Payer: Medicare Other | Attending: Gynecologic Oncology | Admitting: Gynecologic Oncology

## 2019-04-27 ENCOUNTER — Encounter: Payer: Self-pay | Admitting: Gynecologic Oncology

## 2019-04-27 DIAGNOSIS — Z90710 Acquired absence of both cervix and uterus: Secondary | ICD-10-CM

## 2019-04-27 DIAGNOSIS — Z90722 Acquired absence of ovaries, bilateral: Secondary | ICD-10-CM

## 2019-04-27 DIAGNOSIS — C541 Malignant neoplasm of endometrium: Secondary | ICD-10-CM

## 2019-04-27 NOTE — [place] Notes (Signed)
Gynecologic Oncology Telehealth Consult Note: Gyn-Onc  I connected with Roberta [name] on 04/27/19 at  3:00 PM EDT by telephone and verified that I am speaking with the correct person using two identifiers.  I discussed the limitations, risks, security and privacy concerns of performing an evaluation and management service by telemedicine and the availability of in-person appointments. I also discussed with the patient that there may be a patient responsible charge related to this service. The patient expressed understanding and agreed to proceed.  Other persons participating in the visit and their role in the encounter: none.  Patient's location: home Provider's location: [employer]   Chief Complaint:  Chief Complaint  Patient presents with  . endometrial cancer    Assessment:    63 y.o. year old with Stage IA Grade 1 endometrioid endometrial cancer.   S/p robotic assisted total hysterectomy, BSO and bilateral SLN biopsy on 09/04/15. no LVSI, 5% myometrial invasion, neg pelvic washings and negative lymph nodes. Morbid obesity (BMI 40kg/m2)  Plan: 1) endometrial cancer: no evidence for recurrence. Counseled regarding symptoms consistent with recurrence. 2) obesity: counseled patient about the association between obesity and endometrial cancer and poor prognosis.  3)  Return to clinic in 6 months to see me.  HPI:  Roberta [name] is a 63 y.o. year old [id number] initially seen in consultation on 08/11/15 for endometrial cancer.  She then underwent a robotic hysterectomy, BSO and bilateral sentinel node biopsy on 6/71/24 without complications.  Her postoperative course was uncomplicated.  Her final pathologic diagnosis is a Stage IA Grade 1 endometrioid endometrial cancer with no lymphovascular space invasion, 1/17 mm (5%) of myometrial invasion and negative lymph nodes (SLN's were removed from bilateral basins, however, no nodal tissue was removed from the left SLN specimen,  therefore nodal evaluation was only complete on the right).  Interval Hx: She is seen today for a routine cancer surveillance visit.  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 She has no other complaints today.  Current Outpatient Medications on File Prior to Visit  Medication Sig Dispense Refill  . albuterol (PROAIR HFA) 108 (90 Base) MCG/ACT inhaler Inhale 2 puffs into the lungs every 4 (four) hours as needed. 2 Inhaler 11  . albuterol (PROVENTIL) (2.5 MG/3ML) 0.083% nebulizer solution Take 3 mLs (2.5 mg total) by nebulization every 6 (six) hours as needed for wheezing or shortness of breath. 75 mL 1  . cyclobenzaprine (FLEXERIL) 10 MG tablet Take 10 mg daily as needed by mouth for muscle spasms.    . dorzolamide-timolol (COSOPT) 22.3-6.8 MG/ML ophthalmic solution Place 1 drop into both eyes 2 (two) times daily. 10 mL 12  . esomeprazole (NEXIUM) 20 MG capsule Take 20 mg daily as needed by mouth (acid reflux).    . fluvastatin (LESCOL) 40 MG capsule Take 1 capsule (40 mg total) by mouth at bedtime. 30 capsule 3  . meclizine (ANTIVERT) 12.5 MG tablet Take 1 tablet (12.5 mg total) by mouth 3 (three) times daily as needed. 90 tablet 0  . Multiple Vitamins-Minerals (HAIR SKIN AND NAILS FORMULA PO) Take 1 tablet 3 (three) times a week by mouth.     . ondansetron (ZOFRAN) 4 MG tablet TAKE 1 TABLET BY MOUTH EVERY 8 HOURS AS NEEDED FOR NAUSEA AND VOMITING 20 tablet 0  . traMADol (ULTRAM) 50 MG tablet Take 50 mg daily as needed by mouth for moderate pain.    Marland Kitchen  Travoprost, BAK Free, (TRAVATAN) 0.004 % SOLN ophthalmic solution Place 1 drop into both eyes at bedtime. 2.5 mL 3   No current facility-administered medications on file prior to visit.    Allergies  Allergen Reactions  . Influenza Vaccines Anaphylaxis    Throat swelling reported after flu shot in the past (cannot verify with records but would not give)  . Budesonide Other  (See Comments)    Throat infection per patient  . Flonase [Fluticasone Propionate] Other (See Comments)    Upper respitory  issues  . Codeine Nausea And Vomiting  . Morphine And Related Nausea And Vomiting  . Nickel Rash   Past Medical History:  Diagnosis Date  . Abnormal EKG    nonspecific ST and T-wave changes - Followed by Dr.Berry  . ALLERGIC RHINITIS 10/14/2010  . Anemia   . Asthma   . ASTHMA, INTERMITTENT 02/16/2007  . BACK PAIN, CHRONIC 03/25/2009  . CERVICAL RADICULOPATHY 10/16/2009  . DDD (degenerative disc disease), lumbosacral   . Depression   . DEPRESSIVE DISORDER NOT ELSEWHERE CLASSIFIED 03/25/2009  . Endometrial cancer ([place])   . GERD (gastroesophageal reflux disease) 09/09/2011  . Glaucoma    sees optho every 3 months, drops each night  . Hyperlipidemia   . Hypertension   . PONV (postoperative nausea and vomiting)   . Postmenopausal vaginal bleeding 09/24/2014   Past Surgical History:  Procedure Laterality Date  . ANKLE SURGERY    . BIOPSY BREAST     LEFT  . BREAST CYST INCISION AND DRAINAGE     under breast  . BUNIONECTOMY     bilateral and toe correction  . CARPAL TUNNEL RELEASE Bilateral 2009  . CATARACT SURGERY    . CERVICAL SPINE SURGERY    . CHOLECYSTECTOMY  1980  . DILATATION & CURRETTAGE/HYSTEROSCOPY WITH RESECTOCOPE N/A 07/28/2015   Procedure: DILATATION & CURETTAGE/HYSTEROSCOPY WITH RESECTOCOPE;  Surgeon: Eldred Manges, MD;  Location: [facility];  Service: Gynecology;  Laterality: N/A;  . KNEE ARTHROSCOPY    . ROBOTIC ASSISTED TOTAL HYSTERECTOMY WITH BILATERAL SALPINGO OOPHERECTOMY Bilateral 09/04/2015   Procedure: ROBOTIC ASSISTED HYSTERECTOMY WITH BILATERAL SALPINGO OOPHORECTOMY SENTINAL NODE MAPPING ;  Surgeon: Everitt Amber, MD;  Location: [facility];  Service: Gynecology;  Laterality: Bilateral;  . ROTATOR CUFF REPAIR Right May 2014   Family History  Problem Relation Age of Onset  . Heart disease Mother        MI in 24s  . Lymphoma Mother         related to asbestos  . Cancer Mother        lymphoma (asbestos exposure)  . Alcoholism Father   . Cirrhosis Father        due to alcohol  . Asthma Father   . Cancer Maternal Uncle        lung  . Cancer Maternal Uncle        lung   Social History   Socioeconomic History  . Marital status: Divorced    Spouse name: Not on file  . Number of children: 4  . Years of education: 134  . Highest education level: Not on file  Occupational History  . Occupation: DISABLED    Employer: UNEMPLOYED  . Occupation: Previously a Proofreader  . Financial resource strain: Not on file  . Food insecurity:    Worry: Not on file    Inability: Not on file  . Transportation needs:    Medical: Not on file    Non-medical:  Not on file  Tobacco Use  . Smoking status: Never Smoker  . Smokeless tobacco: Never Used  Substance and Sexual Activity  . Alcohol use: No    Alcohol/week: 0.0 standard drinks  . Drug use: No  . Sexual activity: Never  Lifestyle  . Physical activity:    Days per week: Not on file    Minutes per session: Not on file  . Stress: Not on file  Relationships  . Social connections:    Talks on phone: Not on file    Gets together: Not on file    Attends religious service: Not on file    Active member of club or organization: Not on file    Attends meetings of clubs or organizations: Not on file    Relationship status: Not on file  . Intimate partner violence:    Fear of current or ex partner: Not on file    Emotionally abused: Not on file    Physically abused: Not on file    Forced sexual activity: Not on file  Other Topics Concern  . 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 Part time student due to learning disability. 4 kids.       On disability.       College education. Lives alone. No stairs.               Review of  systems: Constitutional:  She has no weight gain or weight loss. She has no fever or chills. Eyes: No blurred vision Ears, Nose, Mouth, Throat: No dizziness, headaches or changes in hearing. No mouth sores. Cardiovascular: No chest pain, palpitations or edema. Respiratory:  No shortness of breath, wheezing or cough Gastrointestinal: She has normal bowel movements without diarrhea or constipation. She denies any nausea or vomiting. She denies blood in her stool or heart burn. no abdominal/low flank pain Genitourinary:  She denies pelvic pain, pelvic pressure or changes in her urinary function. She has no hematuria, dysuria, or incontinence. She has no irregular vaginal bleeding or vaginal discharge Musculoskeletal: Denies muscle weakness or joint pains.  Skin:  She has no skin changes, rashes or itching Neurological:  Denies dizziness or headaches. No neuropathy, no numbness or tingling. Psychiatric:  She denies depression or anxiety. Hematologic/Lymphatic:   No easy bruising or bleeding   Physical Exam: There were no vitals taken for this visit. Deferred due to webex visit. Appears in no distress.  I discussed the assessment and treatment plan with the patient. The patient was provided with an opportunity to ask questions and all were answered. The patient agreed with the plan and demonstrated an understanding of the instructions.   The patient was advised to call back or see an in-person evaluation if the symptoms worsen or if the condition fails to improve as anticipated.   I provided 20 minutes of face-to-face video visit time during this encounter, and > 50% was spent counseling as documented under my assessment & plan.  Thereasa Solo, MD

## 2019-04-27 NOTE — Patient Instructions (Signed)
To return to see Dr Denman George in October, 2020.

## 2019-05-15 ENCOUNTER — Telehealth: Payer: Self-pay | Admitting: *Deleted

## 2019-05-15 NOTE — Telephone Encounter (Signed)
Spoke to patient over the phone. Patient reports that she is currently asymptomatic. States that she measures her BP daily and noticed her BP getting as high as 287 systolic (which is high for her). Today her BP is normotensive. States that when her BP gets high she gets a headache and this improves with HCTZ use. Patient was recently discontinued from her ACEI as well as HCTZ for hypotension. Patient also notes LE edema and "more veins" in her legs.   Given symptoms of hypertension, edema, and some headaches advised patient be seen in clinic. Due to transportation needs best time is 5/29. Patient's BP this am is normotensive and currently asymptomatic so ok to wait. Advised that if symptoms present or worsen she should call immediately or go to ED. Strict return precautions given.   Will route to Dr. Ky Barban who will see patient in clinic.   Dalphine Handing, PGY-2 [facility] 05/15/2019 12:08 PM

## 2019-05-15 NOTE — Telephone Encounter (Signed)
Rx request for hydrchlorothiazide 25 mg. Not on med list. Please advise. Elexus Barman Kennon Holter, CMA

## 2019-05-15 NOTE — Telephone Encounter (Signed)
Patient discontinued from HCTZ on 3/19. She does not need to be restarted unless she is having high blood pressure at home, if this is the case she should have a telehealth visit to ensure we have her on a good BP regimen.   Dalphine Handing, PGY-2 [facility] 05/15/2019 10:02 AM

## 2019-05-15 NOTE — Telephone Encounter (Signed)
Called and spoke to patient and she is aware that her HCTZ was discontinued but was instructed to resume taking it if her pressures increased or if she had swelling.  Patient states that her pressure is normally 117/60 but recently depending on what she eats it has been 131/83.  Patient states that her ankles are swelling as well and that she places her finger in her skin and its leaves and indentation. Patient sates that she feels as if "someone is sitting on her head when her pressure rises".  Patient sates that she feels a lot better when on HCTZ.  Ozella Almond, [place]

## 2019-05-16 ENCOUNTER — Other Ambulatory Visit: Payer: Self-pay

## 2019-05-16 NOTE — Telephone Encounter (Signed)
[facility] requested refill of HCTC. Did not see on med list. Please advise. Ottis Stain, CMA

## 2019-05-17 NOTE — Telephone Encounter (Signed)
Will be addressed at next appointment on 5/29 whether patient still needs HCTZ or not. Was discontinued in March due to hypotension.   Dalphine Handing, PGY-2 [facility] 05/17/2019 2:34 PM

## 2019-05-17 NOTE — [place] Notes (Signed)
  Subjective:   Patient ID: Roberta [name]    DOB: 1956-10-07, 63 y.o. female   MRN: [id number]  Roberta [name] is a 63 y.o. female with a history of HTN, allergies, asthma, cervical disc radiculopathy, uterine fibroid, h/o endometrial cancer, obesity, anemia here for   Edema, HTN - recently d/ced HCTZ and ACEI for hypotension but patient understood to resume HCTZ if she develops LE swelling or high BP. - notices swelling in her ankles and feet whenever the weather changes. Sometimes will feel "out of sorts" with head pressure when she feels her BP is going up. - will drink dandelion tea, and her swelling will go down. - uses wrist cuff at home, this am 123/77. Highest SBP 139 last month, lowest BP 96/64. - feet and ankle usually swells with bad weather, will take fluid pill- - Denies falls or chest pain but sometimes will feel lightheaded when standing up - has not taken HCTZ today.  Review of Systems:  Per HPI.  [place], medications and smoking status reviewed.  Objective:   BP 140/86   Pulse 72   SpO2 95%  Vitals and nursing note reviewed.  General: overweight female, in no acute distress with non-toxic appearance CV: regular rate and rhythm without murmurs, rubs, or gallops, trace lower extremity edema Lungs: clear to auscultation bilaterally with normal work of breathing Skin: warm, dry, no rashes or lesions Extremities: warm and well perfused, normal tone MSK: ROM grossly intact, gait normal Neuro: Alert and oriented, speech normal  Assessment & Plan:   HYPERTENSION, BENIGN SYSTEMIC Per age, is at goal. 136/96 on recheck. Suspect history of LE swelling (R>L) is from arthritic changes given h/o prior surgeries in ankle and foot and reports of worsening with weather changes. Very minimal pitting edema on exam today without taking diuretic for a few days. Patient also experiencing orthostatic changes, explained to pt risk of lower BP for her age. Will NOT refill HCTZ as  previously discussed with PCP. Patient verbalized understanding. She may eventually need renal protection from baby dose of ACEI given recent increase in A1c, but will defer to PCP given reports of lower BP.  OBESITY, NOS Discussed risk of obtaining obesity-related diseases. A1c obtained today and elevated at 6.4. Discussed diet and exercise changes to achieve weight loss. Instructed to f/u with PCP to discuss elevated A1c.  Orders Placed This Encounter  Procedures  . HgB A1c   No orders of the defined types were placed in this encounter.   Rory Percy, DO PGY-2, [facility] 05/18/2019 9:28 AM

## 2019-05-18 ENCOUNTER — Other Ambulatory Visit: Payer: Self-pay

## 2019-05-18 ENCOUNTER — Ambulatory Visit (INDEPENDENT_AMBULATORY_CARE_PROVIDER_SITE_OTHER): Payer: Medicare Other | Admitting: Family Medicine

## 2019-05-18 ENCOUNTER — Encounter: Payer: Self-pay | Admitting: Family Medicine

## 2019-05-18 VITALS — BP 140/86 | HR 72

## 2019-05-18 DIAGNOSIS — I10 Essential (primary) hypertension: Secondary | ICD-10-CM | POA: Diagnosis not present

## 2019-05-18 DIAGNOSIS — R7309 Other abnormal glucose: Secondary | ICD-10-CM

## 2019-05-18 DIAGNOSIS — Z6841 Body Mass Index (BMI) 40.0 and over, adult: Secondary | ICD-10-CM

## 2019-05-18 LAB — POCT GLYCOSYLATED HEMOGLOBIN (HGB A1C): HbA1c, POC (controlled diabetic range): 6.4 % (ref 0.0–7.0)

## 2019-05-18 NOTE — Patient Instructions (Addendum)
It was great to see you!  Our plans for today:  - No need to refill your HCTZ. I suspect your swelling is from arthritis. - Keep measuring your blood pressure no more than once per day.  Take care and seek immediate care sooner if you develop any concerns.   Dr. [facility]  Things to do to keep yourself healthy  - Exercise at least 30-45 minutes a day, 3-4 days a week.  - Eat a low-fat diet with lots of fruits and vegetables, up to 7-9 servings per day.  - Seatbelts can save your life. Wear them always.  - Smoke detectors on every level of your home, check batteries every year.  - Eye Doctor - have an eye exam every 1-2 years  - Safe sex - if you may be exposed to STDs, use a condom.  - Alcohol -  If you drink, do it moderately, less than 2 drinks per day.  - [place]. Choose someone to speak for you if you are not able.  - Depression is common in our stressful world.If you're feeling down or losing interest in things you normally enjoy, please come in for a visit.  - Violence - If anyone is threatening or hurting you, please call immediately.

## 2019-05-18 NOTE — Assessment & Plan Note (Signed)
Discussed risk of obtaining obesity-related diseases. A1c obtained today and elevated at 6.4. Discussed diet and exercise changes to achieve weight loss. Instructed to f/u with PCP to discuss elevated A1c.

## 2019-05-18 NOTE — Assessment & Plan Note (Signed)
Per age, is at goal. 136/96 on recheck. Suspect history of LE swelling (R>L) is from arthritic changes given h/o prior surgeries in ankle and foot and reports of worsening with weather changes. Very minimal pitting edema on exam today without taking diuretic for a few days. Patient also experiencing orthostatic changes, explained to pt risk of lower BP for her age. Will NOT refill HCTZ as previously discussed with PCP. Patient verbalized understanding. She may eventually need renal protection from baby dose of ACEI given recent increase in A1c, but will defer to PCP given reports of lower BP.

## 2019-05-21 NOTE — [place] Notes (Signed)
   Subjective:    Patient ID: Roberta [name], female    DOB: 1956-06-20, 62 y.o.   MRN: [id number]  [place] Telemedicine Visit  Patient consented to have virtual visit. Method of visit: Telephone  Encounter participants: Patient: Roberta [name] - located at home Provider: Caroline More - located at [facility] Others (if applicable): none   CC: follow up for A1C  HPI: Pre-diabetes Patient presenting for follow-up after recently being seen by Dr. Ky Barban on 05/18/2019.  At that visit patient obtained hemoglobin A1c she was found to be elevated to 6.4, was 6.0 in 2018.  Patient was advised to follow-up with PCP to follow this up.  Patient states that since finding this out she has attempted to significantly improve her lifestyle.  See below for details.  Diet: has been trying to improve her diet, even threw out a chocolate cake she had at home. Patient is now on a strict diet and cleaned out her entire home of sweets and carbs. Eating baked meat, no fried foods. Eating dark green leafy greens. No longer eating cholate and chips. Drinking more water   Exercise: increasing her daily walking. Working on weight loss.  Has been trying to increase exercise and exercises with her grandson. A1C: 6.4 on 05/18/2019 Symptoms: No symptoms of hypoglycemia. No symptoms of  polyuria, polydipsia. No numbness in extremities, and No foot ulcers/trauma Meds: Diet controlled   Objective:  There were no vitals taken for this visit. Vitals and nursing note reviewed  General: Patient is in good spirits and motivated Respiratory: Patient is speaking full sentences, no apparent distress, no increased work of breathing   Assessment & Plan:    Pre-diabetes Patient in prediabetic range at A1c of 6.4.  Congratulated patient on her lifestyle changes.  Patient seems very motivated and states "I do not want to have diabetes and be on medications I have seen what this has done to my  friends".  Has cut out all sugar and potato chips.  Is eating healthier.  Is going on daily exercise walks as well.  Advised that she follow-up in 3 months to ensure A1c is improving.  Continue current lifestyle changes.   Follow up in 3 months   Caroline More, DO, PGY-2  Time spent during visit with patient: 10 minutes

## 2019-05-22 ENCOUNTER — Other Ambulatory Visit: Payer: Self-pay

## 2019-05-22 ENCOUNTER — Telehealth (INDEPENDENT_AMBULATORY_CARE_PROVIDER_SITE_OTHER): Payer: Medicare Other | Admitting: Family Medicine

## 2019-05-22 DIAGNOSIS — R7303 Prediabetes: Secondary | ICD-10-CM

## 2019-05-23 DIAGNOSIS — R7303 Prediabetes: Secondary | ICD-10-CM | POA: Insufficient documentation

## 2019-05-23 NOTE — Assessment & Plan Note (Signed)
Patient in prediabetic range at A1c of 6.4.  Congratulated patient on her lifestyle changes.  Patient seems very motivated and states "I do not want to have diabetes and be on medications I have seen what this has done to my friends".  Has cut out all sugar and potato chips.  Is eating healthier.  Is going on daily exercise walks as well.  Advised that she follow-up in 3 months to ensure A1c is improving.  Continue current lifestyle changes.

## 2019-06-08 DIAGNOSIS — H40013 Open angle with borderline findings, low risk, bilateral: Secondary | ICD-10-CM | POA: Diagnosis not present

## 2019-06-21 ENCOUNTER — Encounter: Payer: Self-pay | Admitting: Family Medicine

## 2019-07-10 ENCOUNTER — Ambulatory Visit: Payer: Medicare Other | Admitting: Psychology

## 2019-08-01 ENCOUNTER — Other Ambulatory Visit: Payer: Self-pay | Admitting: Family Medicine

## 2019-08-01 DIAGNOSIS — I10 Essential (primary) hypertension: Secondary | ICD-10-CM

## 2019-08-01 NOTE — [place] Notes (Signed)
Lisinopril discontinued

## 2019-09-06 ENCOUNTER — Telehealth: Payer: Self-pay | Admitting: *Deleted

## 2019-09-06 NOTE — Telephone Encounter (Signed)
Patient called and sated "I'm having some back pain. I also had a brown streak on my panty liner, is that ok?" Explained that I would have the nurse call her back

## 2019-09-06 NOTE — Telephone Encounter (Signed)
Pt states that she has back pain when it rains.  She said her back hurt last night and it has gone away. No pain with urination. Ms Fahl states that she had a small amount of brownish drainage on her panty liner yesterday X 1. She has not had any change in bowel habits, abdominal bloating , or early satiety. Told her that if she begins with pain with urination and low grade teps that she should call her PCP to get a urinalysis to check for UTI. If the vaginal discharge increases and or becomes bright red, she needs to call Dr. Serita Grit  office.   Pt not scheduled for a follow up in November as noted at tele health visit in May. Scheduled her for a follow up on 09-24-19 so she have an internal exam with some discharged note.

## 2019-09-07 ENCOUNTER — Ambulatory Visit (INDEPENDENT_AMBULATORY_CARE_PROVIDER_SITE_OTHER): Payer: Medicare Other | Admitting: Family Medicine

## 2019-09-07 ENCOUNTER — Other Ambulatory Visit: Payer: Self-pay

## 2019-09-07 VITALS — BP 142/90 | HR 85

## 2019-09-07 DIAGNOSIS — R399 Unspecified symptoms and signs involving the genitourinary system: Secondary | ICD-10-CM | POA: Diagnosis not present

## 2019-09-07 DIAGNOSIS — R3121 Asymptomatic microscopic hematuria: Secondary | ICD-10-CM

## 2019-09-07 LAB — POCT URINALYSIS DIP (MANUAL ENTRY)
Bilirubin, UA: NEGATIVE
Glucose, UA: NEGATIVE mg/dL
Ketones, POC UA: NEGATIVE mg/dL
Leukocytes, UA: NEGATIVE
Nitrite, UA: NEGATIVE
Protein Ur, POC: NEGATIVE mg/dL
Spec Grav, UA: 1.015 (ref 1.010–1.025)
Urobilinogen, UA: 0.2 E.U./dL
pH, UA: 5.5 (ref 5.0–8.0)

## 2019-09-07 LAB — POCT UA - MICROSCOPIC ONLY

## 2019-09-07 NOTE — Patient Instructions (Signed)
It looks like you continue to have blood in your urine.  I am not sure exactly why you have this persistent blood.  There is no evidence of a urinary tract infection.  The next best step is to follow up with your urologist again.  I do not suspect that you need a new referral, simply call to tell them that you have blood in your urine again and were told to be seen again by your urologist. Please let me know if you run into any trouble with that.

## 2019-09-09 ENCOUNTER — Other Ambulatory Visit: Payer: Self-pay | Admitting: Family Medicine

## 2019-09-09 ENCOUNTER — Encounter: Payer: Self-pay | Admitting: Family Medicine

## 2019-09-09 DIAGNOSIS — I10 Essential (primary) hypertension: Secondary | ICD-10-CM

## 2019-09-10 ENCOUNTER — Telehealth: Payer: Self-pay | Admitting: Family Medicine

## 2019-09-10 ENCOUNTER — Telehealth: Payer: Self-pay | Admitting: Psychology

## 2019-09-10 NOTE — Telephone Encounter (Signed)
Called pt to ask about if interested in [facility] services with Dr. Hartford Poli. Pt wanted to know about co-pay for mental health services. Pt was advised to call insurance to confirm co-pay.  Pt stated she would call back to set up appt or get more info about mental health service resources.

## 2019-09-10 NOTE — Telephone Encounter (Signed)
Pt calls back, she will need an order faxed to Dr. Marlan Palau office for lab order on it.  She does not have the fax number. Christen Bame, CMA

## 2019-09-10 NOTE — Telephone Encounter (Signed)
Returned call about co-pay being no fee. Left VM

## 2019-09-10 NOTE — Telephone Encounter (Signed)
Med was discontinued back on 08/01/19.  Pt states that she and Dr. Tammi Klippel discussed that she is to only take the lisinopril when she feels her BP is high.    Will forward to PCP for clarification and permission to call pharmacy and continue fills. Christen Bame, CMA

## 2019-09-10 NOTE — Telephone Encounter (Addendum)
Patient sent mychart message discussing lisinopril. I called her to discuss over the phone.   Per patient she was unable to receive a lisinopril refill as her pharmacy said it was sent via mail order.  Per chart review patient was discontinued off of lisinopril back in March due to hypotensive episodes.  Patient reports that she recently started taking lisinopril as needed due to hypertension at home.  States that recently the stress in her life has increased and her blood pressures are around 140/90.  When she takes her lisinopril her blood pressure improves to 120/80.  She has been taking her blood pressure daily.  I informed her that lisinopril is not an as needed medication and she should only take this medication as prescribed by her doctor.  I advised that patient come in to be seen so that we can monitor kidney function as well as check blood pressure here in office to ensure no hypotension.  Appointment made for 9/23, time per her request.   Patient informed me she has a nephrology appointment on 9/22.  I informed patient that if they are able to obtain a BMP at that office to check kidney levels we may not need to repeat it on 9/23.  If that is the case patient states she would prefer to have a telephone visit on 9/23 and will check her blood pressures using her cuff at home.  Per visit on 04/19/2019 with Dr. Garlan Fillers her blood pressure cuff closely matched what was measured in the office.  Patient also informed me that she thinks some of her prescriptions were being sent to a different [facility] per her discussion with her pharmacy.  She stated that pharmacist told her that lisinopril prescriptions were being sent to a mail in pharmacy.  I reviewed her lisinopril prescriptions in epic all of which were sent to [facility].  I am unsure why on [facility] is and it appeared to have been a mail order company.  I advised that if further prescriptions are not at pharmacy to please call and I will call the  pharmacy myself to find where the discrepancy is.  While on the phone patient informed me that she is under significant anxiety and stress right now.  States that her grandson recently attempted to commit suicide.  States that this is causing her and her family and significant stress.  Patient has battled with depression herself and cannot understand why someone as young as her grandson would want to commit suicide.  States that she thinks the quarantining due to coronavirus has caused him to be very isolated and feel alone.  Patient states she thinks she needs to talk to someone.  I offered a listening ear.  I have sent MyChart message to provide patient with therapy resources in [place].  Patient also requested I send a message to Dr. Hartford Poli to have behavioral health in our clinic.  I have also sent MyChart message to Dr. Hartford Poli informing her of this.    We will plan to follow-up with patient for blood pressure and to see if she was able to contact behavioral health during her appointment on 9/23.  Dalphine Handing, PGY-3 [facility] 09/10/2019 12:06 PM

## 2019-09-10 NOTE — Telephone Encounter (Signed)
-----   Message from Caroline More, DO sent at 09/10/2019 12:04 PM EDT ----- Hi Dr. Hartford Poli,  A patient of mine states she would like to behavioral health at our clinic. Her grandson recently attempted suicide and she is having increased anxiety due to this. During a phone call with me she mentioned interested in talking to behavioral health at our clinic. I also provided her with therapy resources in [place] in case you were unable to see her.   Thanks,  [employer]

## 2019-09-10 NOTE — Telephone Encounter (Signed)
I discussed this with patient over the phone. Lisinopril was discontinued for hypotension. This is a daily med not PRN. Patient will come in on 9/23 to discuss further and to determine if she needs further RX.  Dalphine Handing, PGY-3 [facility] 09/10/2019 12:20 PM

## 2019-09-11 ENCOUNTER — Encounter: Payer: Self-pay | Admitting: Family Medicine

## 2019-09-11 ENCOUNTER — Other Ambulatory Visit: Payer: Self-pay | Admitting: Family Medicine

## 2019-09-11 DIAGNOSIS — R3121 Asymptomatic microscopic hematuria: Secondary | ICD-10-CM | POA: Diagnosis not present

## 2019-09-11 DIAGNOSIS — R7303 Prediabetes: Secondary | ICD-10-CM | POA: Diagnosis not present

## 2019-09-11 DIAGNOSIS — I10 Essential (primary) hypertension: Secondary | ICD-10-CM

## 2019-09-11 DIAGNOSIS — R319 Hematuria, unspecified: Secondary | ICD-10-CM | POA: Insufficient documentation

## 2019-09-11 NOTE — Assessment & Plan Note (Signed)
She is found to have microscopic hematuria on urinalysis today in clinic.  She denies any symptoms.  Per recommendations from last available urology note, she was encouraged to follow-up with urology for potential work-up.

## 2019-09-11 NOTE — Telephone Encounter (Signed)
I have ordered BMP, lipid panel, and A1C to be drawn at urology office. Lab requisition forms printed and placed in to be faxed pile in front office.   Dalphine Handing, PGY-3 [facility] 09/11/2019 9:28 AM

## 2019-09-11 NOTE — Telephone Encounter (Signed)
Please call patient and clarify fax number. If she does not know she may need to just come in on 9/23 and have lab draw in our office   Roberta More, DO, PGY-3 [facility] 09/11/2019 8:30 AM

## 2019-09-11 NOTE — Progress Notes (Signed)
    Subjective:  Roberta [name] is a 63 y.o. female who presents to the [facility] today with a chief complaint of blood on panty liner.   HPI:  Hematuria [name] reports that she noticed a dried brown coloration on her panty liner 1 day ago.  She has not noticed any additional symptoms.  She is concerned because she has had issues with blood in her urine previously.  She is here today to assess her urine for blood.  She is previously been seen by urology for microscopic hematuria.  Per the last available urology note, it appears she was supposed to follow-up with urology at 6 months to see if there is still blood in her urine.  If blood were still present, she would begin another hematuria work-up.  She denies dysuria, polyuria, urinary urgency, suprapubic pain, nausea/vomiting, fever, new back pain, hematochezia, melena.  She is previously had a hysterectomy with bilateral salpingo-oophorectomy.  Chief Complaint noted Review of Symptoms - see HPI PMH - Smoking status noted.    Objective:  Physical Exam: BP (!) 142/90   Pulse 85    Gen: NAD, resting comfortably CV: RRR with no murmurs appreciated Pulm: NWOB, CTAB with no crackles, wheezes, or rhonchi GI: Normal bowel sounds present. Soft, Nontender, Nondistended. MSK: no edema, cyanosis, or clubbing noted Skin: warm, dry   No results found for this or any previous visit (from the past 72 hour(s)).   Assessment/Plan:  Hematuria She is found to have microscopic hematuria on urinalysis today in clinic.  She denies any symptoms.  Per recommendations from last available urology note, she was encouraged to follow-up with urology for potential work-up.

## 2019-09-11 NOTE — Telephone Encounter (Signed)
Called patient and was given the name of the office patient attends. Patient needs order for lab draw sent to:  [facility]  Attn: Dr. Gloriann Loan [address]nd floor [phone] fax [phone] office  .Ozella Almond, CMA

## 2019-09-11 NOTE — Progress Notes (Signed)
[place] Telemedicine Visit I connected with  Roberta [name] on 09/12/19 by a video enabled telemedicine application and verified that I am speaking with the correct person using two identifiers.   I discussed the limitations of evaluation and management by telemedicine. The patient expressed understanding and agreed to proceed.  Patient consented to have virtual visit. Method of visit: Video via [place]   Encounter participants: Patient: Roberta [name] - located at Home Provider: Caroline [name] - located at [facility] Others (if applicable): None  Chief Complaint: HTN, pre-diabetes, HLD  HPI: Hypertension: - Medications: none, is using lisinopril PRN  - Compliance: takes lisinopril on occasion  - Checking BP at home: between 121/71 (Home), 117/70 (Dr. Purvis [place] office). These pressures were while taking lisinopril. When not taking medications BP is around 140/80s or higher. Patient at that time has headaches. Pressure seems to increase when she eats salty foods  - Denies any SOB, CP, vision changes, LE edema, medication SEs, or symptoms of hypotension - Diet: does not buy salt in the house. Has a poor appetite 2/2 increased stress in her life. Is doing online school.  - Exercise: hasn't been doing well this week due to life stressors, not getting out as much. Plans to get back to her workout schedule once she helps ease some life stresses   Pre Diabetes Fasting checks: does not check Post prandial does not check  Compliance: eats a healthy diet and daily exercise  Diet: see above   Exercise: see above  A1C: 6.4 3 months ago Symptoms: no symptoms of hypoglycemia. no symptoms of  polyuria, polydipsia. no numbness in extremities, and no foot ulcers/trauma Meds: none  Monitoring Labs and Parameters Last A1C:  Lab Results  Component Value Date   HGBA1C 6.4 05/18/2019   Last Lipid:     Component Value Date/Time   CHOL 146 10/10/2018 1451   HDL 39 (L)  10/10/2018 1451   LDLDIRECT 122 (H) 09/03/2014 1506   Last Bmet  Potassium  Date Value Ref Range Status  10/26/2018 3.5 3.5 - 5.2 mmol/L Final   Sodium  Date Value Ref Range Status  10/26/2018 137 134 - 144 mmol/L Final   Creat  Date Value Ref Range Status  09/22/2016 1.11 (H) 0.50 - 0.99 mg/dL Final    Comment:      For patients > or = 65 years of age: The upper reference limit for Creatinine is approximately 13% higher for people identified as African-American.      Creatinine, Ser  Date Value Ref Range Status  10/26/2018 0.89 0.57 - 1.00 mg/dL Final     Hyperlipidemia Meds: none Diet: see above  Exercise: see above   ROS: per HPI  Pertinent PMHx: HTN, obesity, HLD, pre-diabetes   Exam:  Respiratory: speaking full sentences, no increased WOB   Assessment/Plan:  HYPERTENSION, BENIGN SYSTEMIC Patient appears to be hypertensive with symptoms when not on any blood pressure medications.  Blood pressure reach max of 140/80.  I explained to patient that this is in okay blood pressure for someone her age.  Patient states that she has headaches with this blood pressure.  Given that patient is symptomatic with improved blood pressure and symptoms while taking lisinopril 5 mg I will give her a prescription for lisinopril 5 mg.  Refills also sent to pharmacy.  Patient's BMP showed a creatinine of 0.7 as well as GFR of 106.1.  These labs were obtained by urology office and I will scanned  them in for media.  Follow-up in 1 month so we can ensure her blood pressure is improving.  Pre-diabetes Continues to work on daily exercise as well as healthy diet.  Will try and improve as she has fallen off of her plan recently due to COVID and stress.  I attempted to get A1c with other blood work at urology office but they have obtained hemoglobin instead of hemoglobin A1c.  Will obtain this with next in person visit here in our clinic.  Advised to continue daily exercise as well as healthy  diet.  HYPERLIPIDEMIA ASCVD risk 7% from recent lipid panel.  Advised that patient continue healthy diet and daily exercise.  Patient does not technically have diabetes so will not need a statin at this time as her ASCVD risk is low.  Patient also wants to work on diet and exercise.  Follow-up in 1 year  History of anaphylaxis Patient with history of anaphylaxis.  States that she would really like to get the influenza vaccine as well as pneumonia vaccine.  Patient previously had witnessed anaphylaxis when obtaining the influenza vaccine.  Given that she has severe allergy I will refer to allergist to have further allergy testing prior to giving any [name] immunizations here in our clinic.  Patient also requested this so she can have allergy testing.    Time spent during visit with patient: 20 minutes

## 2019-09-12 ENCOUNTER — Other Ambulatory Visit: Payer: Self-pay

## 2019-09-12 ENCOUNTER — Telehealth: Payer: Medicare Other | Admitting: Family Medicine

## 2019-09-12 ENCOUNTER — Telehealth (INDEPENDENT_AMBULATORY_CARE_PROVIDER_SITE_OTHER): Payer: Medicare Other | Admitting: Family Medicine

## 2019-09-12 DIAGNOSIS — Z87892 Personal history of anaphylaxis: Secondary | ICD-10-CM | POA: Diagnosis not present

## 2019-09-12 DIAGNOSIS — Z1239 Encounter for other screening for malignant neoplasm of breast: Secondary | ICD-10-CM | POA: Diagnosis not present

## 2019-09-12 DIAGNOSIS — F32A Depression, unspecified: Secondary | ICD-10-CM

## 2019-09-12 DIAGNOSIS — R112 Nausea with vomiting, unspecified: Secondary | ICD-10-CM

## 2019-09-12 DIAGNOSIS — I10 Essential (primary) hypertension: Secondary | ICD-10-CM

## 2019-09-12 DIAGNOSIS — R7303 Prediabetes: Secondary | ICD-10-CM | POA: Diagnosis not present

## 2019-09-12 DIAGNOSIS — F329 Major depressive disorder, single episode, unspecified: Secondary | ICD-10-CM

## 2019-09-12 MED ORDER — ONDANSETRON HCL 4 MG PO TABS: ORAL_TABLET | ORAL | 0 refills | Status: DC

## 2019-09-12 MED ORDER — MECLIZINE HCL 12.5 MG PO TABS: 12.5000 mg | ORAL_TABLET | Freq: Three times a day (TID) | ORAL | 0 refills | Status: DC | PRN

## 2019-09-12 NOTE — Assessment & Plan Note (Signed)
ASCVD risk 7% from recent lipid panel.  Advised that patient continue healthy diet and daily exercise.  Patient does not technically have diabetes so will not need a statin at this time as her ASCVD risk is low.  Patient also wants to work on diet and exercise.  Follow-up in 1 year

## 2019-09-12 NOTE — Assessment & Plan Note (Signed)
Patient appears to be hypertensive with symptoms when not on any blood pressure medications.  Blood pressure reach max of 140/80.  I explained to patient that this is in okay blood pressure for someone her age.  Patient states that she has headaches with this blood pressure.  Given that patient is symptomatic with improved blood pressure and symptoms while taking lisinopril 5 mg I will give her a prescription for lisinopril 5 mg.  Refills also sent to pharmacy.  Patient's BMP showed a creatinine of 0.7 as well as GFR of 106.1.  These labs were obtained by urology office and I will scanned them in for media.  Follow-up in 1 month so we can ensure her blood pressure is improving.

## 2019-09-12 NOTE — Telephone Encounter (Signed)
Pt was able to get labs drawn yesterday, so she opted for a virtual ([facility]) visit today. Christen Bame, CMA

## 2019-09-12 NOTE — Assessment & Plan Note (Signed)
Patient with history of anaphylaxis.  States that she would really like to get the influenza vaccine as well as pneumonia vaccine.  Patient previously had witnessed anaphylaxis when obtaining the influenza vaccine.  Given that she has severe allergy I will refer to allergist to have further allergy testing prior to giving any more immunizations here in our clinic.  Patient also requested this so she can have allergy testing.

## 2019-09-12 NOTE — Assessment & Plan Note (Signed)
Continues to work on daily exercise as well as healthy diet.  Will try and improve as she has fallen off of her plan recently due to COVID and stress.  I attempted to get A1c with other blood work at urology office but they have obtained hemoglobin instead of hemoglobin A1c.  Will obtain this with next in person visit here in our clinic.  Advised to continue daily exercise as well as healthy diet.

## 2019-09-13 ENCOUNTER — Ambulatory Visit (INDEPENDENT_AMBULATORY_CARE_PROVIDER_SITE_OTHER): Payer: Medicare Other | Admitting: Psychology

## 2019-09-13 ENCOUNTER — Other Ambulatory Visit: Payer: Self-pay

## 2019-09-13 DIAGNOSIS — F32A Depression, unspecified: Secondary | ICD-10-CM

## 2019-09-13 DIAGNOSIS — F329 Major depressive disorder, single episode, unspecified: Secondary | ICD-10-CM
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
## 2019-09-21 ENCOUNTER — Telehealth: Payer: Self-pay | Admitting: Psychology

## 2019-09-21 NOTE — Telephone Encounter (Signed)
Called pt to check in.  Pt reported stress. Dr. Hartford Poli engaged in encouraging using coping strategies. Pt denied HI/SI.  Pt confirmed appt 10/6.

## 2019-09-24 ENCOUNTER — Encounter: Payer: Self-pay | Admitting: Gynecologic Oncology

## 2019-09-24 ENCOUNTER — Inpatient Hospital Stay: Payer: Medicare Other | Attending: Gynecologic Oncology | Admitting: Gynecologic Oncology

## 2019-09-24 ENCOUNTER — Other Ambulatory Visit: Payer: Self-pay

## 2019-09-24 VITALS — BP 143/95 | HR 80 | Temp 98.0°F | Resp 18 | Wt 248.0 lb

## 2019-09-24 DIAGNOSIS — Z90722 Acquired absence of ovaries, bilateral: Secondary | ICD-10-CM | POA: Diagnosis not present

## 2019-09-24 DIAGNOSIS — Z6841 Body Mass Index (BMI) 40.0 and over, adult: Secondary | ICD-10-CM | POA: Diagnosis not present

## 2019-09-24 DIAGNOSIS — Z90710 Acquired absence of both cervix and uterus: Secondary | ICD-10-CM | POA: Insufficient documentation

## 2019-09-24 DIAGNOSIS — C541 Malignant neoplasm of endometrium: Secondary | ICD-10-CM | POA: Diagnosis not present

## 2019-09-24 NOTE — Progress Notes (Signed)
Gynecologic Oncology Follow-up  Chief Complaint:  Chief Complaint  Patient presents with  . Endometrial cancer [facility])    Assessment:    63 y.o. year old with Stage IA Grade 1 endometrioid endometrial cancer.   S/p robotic assisted total hysterectomy, BSO and bilateral SLN biopsy on 09/04/15. no LVSI, 5% myometrial invasion, neg pelvic washings and negative lymph nodes. Morbid obesity (BMI 40kg/m2)  Plan: 1) endometrial cancer: no evidence for recurrence. Counseled regarding symptoms consistent with recurrence. 2) obesity: counseled patient about the association between obesity and endometrial cancer and poor prognosis.  3)  Return to clinic in 6 months to see me.  HPI:  Roberta [name] is a 63 y.o. year old [id number] initially seen in consultation on 08/11/15 for endometrial cancer.  She then underwent a robotic hysterectomy, BSO and bilateral sentinel node biopsy on [id number] without complications.  Her postoperative course was uncomplicated.  Her final pathologic diagnosis is a Stage IA Grade 1 endometrioid endometrial cancer with no lymphovascular space invasion, 1/17 mm (5%) of myometrial invasion and negative lymph nodes (SLN's were removed from bilateral basins, however, no nodal tissue was removed from the left SLN specimen, therefore nodal evaluation was only complete on the right).  Interval Hx: She is seen today for a routine cancer surveillance visit.  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 She has no other complaints today.  Current Outpatient Medications on File Prior to Visit  Medication Sig Dispense Refill  . albuterol (PROAIR HFA) 108 (90 Base) MCG/ACT inhaler Inhale 2 puffs into the lungs every 4 (four) hours as needed. 2 Inhaler 11  . albuterol (PROVENTIL) (2.5 MG/3ML) 0.083% nebulizer solution Take 3 mLs (2.5 mg total) by nebulization every 6 (six) hours as needed for wheezing or shortness of breath. 75 mL 1   . Ascorbic Acid (VITAMIN C PO) Take by mouth.    . cyclobenzaprine (FLEXERIL) 10 MG tablet Take 10 mg daily as needed by mouth for muscle spasms.    . dorzolamide-timolol (COSOPT) 22.3-6.8 MG/ML ophthalmic solution Place 1 drop into both eyes 2 (two) times daily. 10 mL 12  . esomeprazole (NEXIUM) 20 MG capsule Take 20 mg daily as needed by mouth (acid reflux).    Marland Kitchen lisinopril (ZESTRIL) 5 MG tablet TAKE 1 TABLET BY MOUTH DAILY 90 tablet 3  . meclizine (ANTIVERT) 12.5 MG tablet Take 1 tablet (12.5 mg total) by mouth 3 (three) times daily as needed. 90 tablet 0  . Multiple Vitamins-Minerals (HAIR SKIN AND NAILS FORMULA PO) Take 1 tablet 3 (three) times a week by mouth.     . ondansetron (ZOFRAN) 4 MG tablet TAKE 1 TABLET BY MOUTH EVERY 8 HOURS AS NEEDED FOR NAUSEA AND VOMITING 20 tablet 0  . traMADol (ULTRAM) 50 MG tablet Take 50 mg daily as needed by mouth for moderate pain.    . Travoprost, BAK Free, (TRAVATAN) 0.004 % SOLN ophthalmic solution Place 1 drop into both eyes at bedtime. 2.5 mL 3   No current facility-administered medications on file prior to visit.    Allergies  Allergen Reactions  . Influenza Vaccines Anaphylaxis    Throat swelling reported after flu shot in the past (cannot verify with records but would not give)  . Budesonide Other (See Comments)    Throat infection per patient  . Flonase [Fluticasone Propionate] Other (See Comments)    Upper respitory  issues  . Codeine Nausea And Vomiting  .  Morphine And Related Nausea And Vomiting  . Nickel Rash   Past Medical History:  Diagnosis Date  . Abnormal EKG    nonspecific ST and T-wave changes - Followed by Dr.Berry  . ALLERGIC RHINITIS 10/14/2010  . Anemia   . Asthma   . ASTHMA, INTERMITTENT 02/16/2007  . BACK PAIN, CHRONIC 03/25/2009  . CERVICAL RADICULOPATHY 10/16/2009  . DDD (degenerative disc disease), lumbosacral   . Depression   . DEPRESSIVE DISORDER NOT ELSEWHERE CLASSIFIED 03/25/2009  . Endometrial cancer ([place])    . GERD (gastroesophageal reflux disease) 09/09/2011  . Glaucoma    sees optho every 3 months, drops each night  . Hyperlipidemia   . Hypertension   . PONV (postoperative nausea and vomiting)   . Postmenopausal vaginal bleeding 09/24/2014   Past Surgical History:  Procedure Laterality Date  . ANKLE SURGERY    . BIOPSY BREAST     LEFT  . BREAST CYST INCISION AND DRAINAGE     under breast  . BUNIONECTOMY     bilateral and toe correction  . CARPAL TUNNEL RELEASE Bilateral 2009  . CATARACT SURGERY    . CERVICAL SPINE SURGERY    . CHOLECYSTECTOMY  1980  . DILATATION & CURRETTAGE/HYSTEROSCOPY WITH RESECTOCOPE N/A 07/28/2015   Procedure: DILATATION & CURETTAGE/HYSTEROSCOPY WITH RESECTOCOPE;  Surgeon: Eldred Manges, MD;  Location: [facility];  Service: Gynecology;  Laterality: N/A;  . KNEE ARTHROSCOPY    . ROBOTIC ASSISTED TOTAL HYSTERECTOMY WITH BILATERAL SALPINGO OOPHERECTOMY Bilateral 09/04/2015   Procedure: ROBOTIC ASSISTED HYSTERECTOMY WITH BILATERAL SALPINGO OOPHORECTOMY SENTINAL NODE MAPPING ;  Surgeon: Everitt Amber, MD;  Location: [facility];  Service: Gynecology;  Laterality: Bilateral;  . ROTATOR CUFF REPAIR Right May 2014   Family History  Problem Relation Age of Onset  . Heart disease Mother        MI in 68s  . Lymphoma Mother        related to asbestos  . Cancer Mother        lymphoma (asbestos exposure)  . Alcoholism Father   . Cirrhosis Father        due to alcohol  . Asthma Father   . Cancer Maternal Uncle        lung  . Cancer Maternal Uncle        lung   Social History   Socioeconomic History  . Marital status: Divorced    Spouse name: Not on file  . Number of children: 4  . Years of education: 134  . Highest education level: Not on file  Occupational History  . Occupation: DISABLED    Employer: UNEMPLOYED  . Occupation: Previously a Proofreader  . Financial resource strain: Not on file  . Food insecurity    Worry: Not on file    Inability: Not on  file  . Transportation needs    Medical: Not on file    Non-medical: Not on file  Tobacco Use  . Smoking status: Never Smoker  . Smokeless tobacco: Never Used  Substance and Sexual Activity  . Alcohol use: No    Alcohol/week: 0.0 standard drinks  . Drug use: No  . Sexual activity: Never  Lifestyle  . Physical activity    Days per week: Not on file    Minutes per session: Not on file  . Stress: Not on file  Relationships  . Social Herbalist on phone: Not on file    Gets together: Not on file  Attends religious service: Not on file    Active member of club or organization: Not on file    Attends meetings of clubs or organizations: Not on file    Relationship status: Not on file  . Intimate partner violence    Fear of current or ex partner: Not on file    Emotionally abused: Not on file    Physically abused: Not on file    Forced sexual activity: Not on file  Other Topics Concern  . 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 Part time student due to learning disability. 4 kids.       On disability.       College education. Lives alone. No stairs.               Review of systems: Constitutional:  She has no weight gain or weight loss. She has no fever or chills. Eyes: No blurred vision Ears, Nose, Mouth, Throat: No dizziness, headaches or changes in hearing. No mouth sores. Cardiovascular: No chest pain, palpitations or edema. Respiratory:  No shortness of breath, wheezing or cough Gastrointestinal: She has normal bowel movements without diarrhea or constipation. She denies any nausea or vomiting. She denies blood in her stool or heart burn. no abdominal/low flank pain Genitourinary:  She denies pelvic pain, pelvic pressure or changes in her urinary function. She has no hematuria, dysuria, or incontinence. She has no irregular vaginal  bleeding or vaginal discharge Musculoskeletal: Denies muscle weakness or joint pains.  Skin:  She has no skin changes, rashes or itching Neurological:  Denies dizziness or headaches. No neuropathy, no numbness or tingling. Psychiatric:  She denies depression or anxiety. Hematologic/Lymphatic:   No easy bruising or bleeding   Physical Exam: WD in NAD Neck  Supple NROM, without any enlargements.  Lymph Node Survey No cervical supraclavicular or inguinal adenopathy Cardiovascular  Pulse normal rate, regularity and rhythm. S1 and S2 normal.  Lungs  Clear to auscultation bilateraly, without wheezes/crackles/rhonchi. Good air movement.  Skin  No rash/lesions/breakdown  Psychiatry  Alert and oriented to person, place, and time  Abdomen  Normoactive bowel sounds, abdomen soft, non-tender and obese without evidence of hernia. Soft incisions. Back No CVA tenderness Genito Urinary  Vagina with some prolapse, but no lesions. Rectal  deferred  Extremities  No bilateral cyanosis, clubbing or edema.  Thereasa Solo, MD

## 2019-09-24 NOTE — Patient Instructions (Signed)
Please notify Dr Denman George at phone number [phone] if you notice vaginal bleeding, new pelvic or abdominal pains, bloating, feeling full easy, or a change in bladder or bowel function.   Please contact Dr Serita Grit office (at [phone]) in January, 2021 to request an appointment with her for April, 2021.

## 2019-09-25 ENCOUNTER — Telehealth (INDEPENDENT_AMBULATORY_CARE_PROVIDER_SITE_OTHER): Payer: Medicare Other | Admitting: Psychology

## 2019-09-25 ENCOUNTER — Other Ambulatory Visit: Payer: Self-pay

## 2019-09-25 DIAGNOSIS — F32A Depression, unspecified: Secondary | ICD-10-CM

## 2019-09-25 DIAGNOSIS — F329 Major depressive disorder, single episode, unspecified: Secondary | ICD-10-CM

## 2019-09-26 NOTE — Progress Notes (Signed)
Integrated Behavioral Health Visit via Telemedicine (Telephone)  09/26/2019 Roberta [name] [id number]   Session Start time: 3pm  Session End time: 330pm Total time: 30 minutes  Referring Provider: Dr. Tammi Klippel Type of Visit: Telephonic Patient location: home [facility] Provider location: [facility] All persons participating in visit: pt and provider  Confirmed patient's address: Yes  Confirmed patient's phone number: Yes  Any changes to demographics: No   Confirmed patient's insurance: Yes  Any changes to patient's insurance: No   Discussed confidentiality: Yes    The following statements were read to the patient and/or legal guardian that are established with the [facility] Provider.  "The purpose of this phone visit is to provide behavioral health care while limiting exposure to the coronavirus (COVID19).  There is a possibility of technology failure and discussed alternative modes of communication if that failure occurs."  "By engaging in this telephone visit, you consent to the provision of healthcare.  Additionally, you authorize for your insurance to be billed for the services provided during this telephone visit."   Patient and/or legal guardian consented to telephone visit: Yes   PRESENTING CONCERNS: Patient and/or family reports the following symptoms/concerns: Pt reported that she has been very stressed and frustrated lately. Pt reports that she is stressed with having grandson for virtual school.  Pt reported that she sometimes feels she needs a break.  Pt  and BH discussed coping strategies to anger and stress.  Duration of problem: 3 weeks; Severity of problem: moderate  STRENGTHS (Protective Factors/Coping Skills): Pt engages in praying, knitting, and getting outside as forms of coping   GOALS ADDRESSED: Patient will: 1.  Reduce symptoms of: depression  2.  Increase knowledge and/or ability of: coping skills  3.  Demonstrate ability to: Increase healthy adjustment  to current life circumstances  INTERVENTIONS: Interventions utilized:  Brief CBT Standardized Assessments completed: Not Needed  ASSESSMENT: Patient currently experiencing depression with syx of anger and stress.     Patient may benefit from CBT coping strategies and self-care   PLAN: 1. Follow up with behavioral health clinician on : CBT coping strategies  2. Behavioral recommendations: Utilizing CBT coping strategies to relieve stress and anger  3. Referral(s): [place] (In Clinic)  Erlinda Hong, PhD., LMFT-A

## 2019-09-27 ENCOUNTER — Telehealth: Payer: Medicare Other | Admitting: Psychology

## 2019-10-08 ENCOUNTER — Telehealth: Payer: Medicare Other | Admitting: Psychology

## 2019-10-08 ENCOUNTER — Other Ambulatory Visit: Payer: Self-pay

## 2019-10-08 NOTE — Progress Notes (Signed)
Attempted to call pt twice.  Left VM

## 2019-10-10 NOTE — Progress Notes (Signed)
error 

## 2019-10-11 ENCOUNTER — Telehealth: Payer: Self-pay | Admitting: Psychology

## 2019-10-11 NOTE — Telephone Encounter (Signed)
Returned pt call back. Pt reported on VM left that they missed appt because she had a "rough couple of days" and "shut down".  Returned pt call within same day and left VM to call back along with suicide hotline number if needed.

## 2019-10-12 ENCOUNTER — Telehealth: Payer: Self-pay | Admitting: Psychology

## 2019-10-12 NOTE — Telephone Encounter (Addendum)
Spoke to pt. Pt reported becoming angry at grandson and it shut her down.  She reported she is trying to work on being more mindful of her emotions.  Pt and [place] talked through coping strategies. Pt denied HI/SI. Pt reported she is tried and needs a break.  Pt was encouraged to use coping strategies to decompress.   Pt scheduled appt for 10/27 at 3pm via telephone

## 2019-10-16 ENCOUNTER — Telehealth (INDEPENDENT_AMBULATORY_CARE_PROVIDER_SITE_OTHER): Payer: Medicare Other | Admitting: Psychology

## 2019-10-16 ENCOUNTER — Other Ambulatory Visit: Payer: Self-pay

## 2019-10-16 DIAGNOSIS — F32A Depression, unspecified: Secondary | ICD-10-CM

## 2019-10-16 DIAGNOSIS — F329 Major depressive disorder, single episode, unspecified: Secondary | ICD-10-CM

## 2019-10-17 ENCOUNTER — Encounter: Payer: Self-pay | Admitting: Family Medicine

## 2019-10-17 NOTE — Progress Notes (Signed)
Integrated Behavioral Health Visit via Telemedicine (Telephone)  10/17/2019 DESTINA MCCONVILLE [id number]   Session Start time: 3  Session End time: 330 Total time: 30  Referring Provider: Dr. Tammi Klippel Type of Visit: Telephonic Patient location: home [facility] Provider location: [facility] All persons participating in visit: pt and provider   Confirmed patient's address: Yes   Discussed confidentiality: Yes    The following statements were read to the patient and/or legal guardian that are established with the [facility] Provider.  "The purpose of this phone visit is to provide behavioral health care while limiting exposure to the coronavirus (COVID19).  There is a possibility of technology failure and discussed alternative modes of communication if that failure occurs."  "By engaging in this telephone visit, you consent to the provision of healthcare.  Additionally, you authorize for your insurance to be billed for the services provided during this telephone visit."    PRESENTING CONCERNS: Patient and/or family reports the following symptoms/concerns: Pt reported that she has been engaging to coping strategies to reduce her stress that causes her depression.  Pt reported that she shut down when she became angry with her grandson regarding him paying attention in virtual class.  Pt and [place] talked about what it feels like to be "shut down" and how to recognize it.  Pt and BH discussed understanding triggers for anger and stress and learning to prevent reactions.    Duration of problem: 4 weeks; Severity of problem: moderate  STRENGTHS (Protective Factors/Coping Skills): Pt engages in praying and getting outside to cope   GOALS ADDRESSED: Patient will: 1.  Reduce symptoms of: stress  2.  Increase knowledge and/or ability of: coping skills  3.  Demonstrate ability to: Increase healthy adjustment to current life circumstances  INTERVENTIONS: Interventions utilized:  Brief  CBT Standardized Assessments completed: Not Needed  ASSESSMENT: Patient currently experiencing depression with syx of anger and stress related to current life circumstances.   Patient may benefit from CBT coping strategies and challenging anger.   PLAN: 1. Follow up with behavioral health clinician on : CBT coping strategies  2. Behavioral recommendations: Utilizing CBT coping strategies to relieve stress and anger  3. Referral(s): [place] (In Clinic)  Erlinda Hong, PhD., LMFT-A

## 2019-10-30 ENCOUNTER — Telehealth (INDEPENDENT_AMBULATORY_CARE_PROVIDER_SITE_OTHER): Payer: Medicare Other | Admitting: Psychology

## 2019-10-30 ENCOUNTER — Other Ambulatory Visit: Payer: Self-pay

## 2019-10-30 DIAGNOSIS — F32A Depression, unspecified: Secondary | ICD-10-CM

## 2019-10-30 DIAGNOSIS — F329 Major depressive disorder, single episode, unspecified: Secondary | ICD-10-CM
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
## 2019-11-13 ENCOUNTER — Telehealth: Payer: Self-pay | Admitting: Psychology

## 2019-11-19 ENCOUNTER — Other Ambulatory Visit: Payer: Self-pay

## 2019-11-19 ENCOUNTER — Telehealth (INDEPENDENT_AMBULATORY_CARE_PROVIDER_SITE_OTHER): Payer: Medicare Other | Admitting: Psychology

## 2019-11-19 DIAGNOSIS — F329 Major depressive disorder, single episode, unspecified: Secondary | ICD-10-CM

## 2019-11-19 DIAGNOSIS — F32A Depression, unspecified: Secondary | ICD-10-CM

## 2019-11-19 NOTE — Progress Notes (Signed)
Integrated Behavioral Health Visit via Telemedicine (Telephone)  11/19/2019 Roberta [name] [id number]   Session Start time: 37 Session End time: 250 Total time: 20  Referring Provider: Dr. Tammi Klippel  Type of Visit: Telephonic Patient location: home [facility] Provider location: [facility] All persons participating in visit: pt and provider    The following statements were read to the patient and/or legal guardian that are established with the [facility] Provider.  "The purpose of this phone visit is to provide behavioral health care while limiting exposure to the coronavirus (COVID19).  There is a possibility of technology failure and discussed alternative modes of communication if that failure occurs."  "By engaging in this telephone visit, you consent to the provision of healthcare.  Additionally, you authorize for your insurance to be billed for the services provided during this telephone visit."   Patient and/or legal guardian consented to telephone visit: Yes   PRESENTING CONCERNS: Patient and/or family reports the following symptoms/concerns: Pt reports she is experiencing a great amount of stress related to her relationship with her daughter.  Pt reports that she had a stressful Thanksgiving where her daughter wouldn't social distance and it caused her stress and anger. Pt reported she was able to manage her anger because she cares for her grandson and doesn't want him to be harmed.  Pt reported she struggles to care for her grandson and also engage in safety precautions of COVID with daughter. Pt reported wanting to leave with her grandson for christmas and drive away.  Pt and [place] engaged in the negatives that could result in such decisions. Pt was amendable to not engaging in it. Pt and Dr. Hartford Poli talked about other ways for childcare such as getting help from other family members.   Pt and Dr. Hartford Poli talked about number of visits for therapy with Dr. Hartford Poli and discussed future treatment  outside clinic.  Duration of problem: over a month; Severity of problem: moderate  STRENGTHS (Protective Factors/Coping Skills): Pt engages in coping strategies and insightful of syx, supportive children  GOALS ADDRESSED: Patient will: 1.  Reduce symptoms of: stress  2.  Increase knowledge and/or ability of: coping skills  3.  Demonstrate ability to: Increase healthy adjustment to current life circumstances  INTERVENTIONS: Interventions utilized:  Supportive Counseling Standardized Assessments completed: Not Needed  ASSESSMENT: Patient currently experiencing stress causing depression related to relationship with daughter and current life circumstances.    Patient may benefit from CBT therapy for coping strategies and family therapy with daughter. Marland Kitchen  PLAN: 1. Follow up with behavioral health clinician on : Challenging angry thoughts and engaging in self-care.  2. Behavioral recommendations: Pt should engage in family therapy with daughter and engage in coping strategies for anger and depression.  3. Referral(s): Armed forces logistics/support/administrative officer (LME/Outside Clinic)  Erlinda Hong, PhD., LMFT-A

## 2019-11-20 ENCOUNTER — Telehealth: Payer: Self-pay | Admitting: *Deleted

## 2019-11-20 NOTE — Telephone Encounter (Signed)
Per patient request, routed office note from 10/5 to Dr Charlesetta Garibaldi

## 2019-12-03 ENCOUNTER — Telehealth (INDEPENDENT_AMBULATORY_CARE_PROVIDER_SITE_OTHER): Payer: Medicare Other | Admitting: Psychology

## 2019-12-03 ENCOUNTER — Other Ambulatory Visit: Payer: Self-pay

## 2019-12-03 DIAGNOSIS — F329 Major depressive disorder, single episode, unspecified: Secondary | ICD-10-CM

## 2019-12-03 DIAGNOSIS — F32A Depression, unspecified: Secondary | ICD-10-CM

## 2019-12-03 NOTE — Progress Notes (Signed)
Integrated Behavioral Health Visit via Telemedicine (Telephone)  12/03/2019 Roberta [name] [id number]   Session Start time: 2  Session End time: 44 Total time: 30  Referring Provider: Dr. Tammi Klippel Type of Visit: Telephonic Patient location: home [facility] Provider location: [facility] All persons participating in visit: pt and provider     Discussed confidentiality: Yes    The following statements were read to the patient and/or legal guardian that are established with the [facility] Provider.  "The purpose of this phone visit is to provide behavioral health care while limiting exposure to the coronavirus (COVID19).  There is a possibility of technology failure and discussed alternative modes of communication if that failure occurs."  "By engaging in this telephone visit, you consent to the provision of healthcare.  Additionally, you authorize for your insurance to be billed for the services provided during this telephone visit."   Patient and/or legal guardian consented to telephone visit: Yes   PRESENTING CONCERNS: Patient and/or family reports the following symptoms/concerns: Pt reported that her syx have improved for her depression because she has found ways to decompress her stress. Pt reported that she engaged in walking outside with her family and it was really helpful for her.  Pt and Dr. Hartford Poli dicussed next steps for [facility] therapy.  Pt was encouraged to engage in individual as well as family therapy with her daughter.  Duration of problem: over a month ; Severity of problem: moderate  STRENGTHS (Protective Factors/Coping Skills): Insightful of syx, utilizing coping strategies, and supportive children  GOALS ADDRESSED: Patient will: 1.  Reduce symptoms of: stress  2.  Increase knowledge and/or ability of: coping skills  3.  Demonstrate ability to: Increase healthy adjustment to current life circumstances  INTERVENTIONS: Interventions utilized:  Motivational  Interviewing Standardized Assessments completed: Not Needed  ASSESSMENT: Patient currently experiencing depression related to stressful life transitions and pandemic.   Patient may benefit from continued indivdual therapy and family therapy with daughter   PLAN: 1. Follow up with behavioral health clinician on : Pt will be encouraged to reach out for therapy resources  2. Behavioral recommendations: Cont individual therapy and start family therapy with daughter  3. Referral(s): Armed forces logistics/support/administrative officer (LME/Outside Clinic)  Erlinda Hong, PhD., LMFT-A

## 2019-12-04 ENCOUNTER — Ambulatory Visit: Payer: Self-pay | Admitting: Licensed Clinical Social Worker

## 2019-12-04 NOTE — Telephone Encounter (Signed)
Pt was directed to call insurance for continued [facility] insurance by Dr. Hartford Poli

## 2019-12-05 NOTE — Chronic Care Management (AMB) (Signed)
   Social Work  Care Management Consultation  12/05/2019 Name: Roberta [name] MRN: [id number] DOB: May 28, 1956   Roberta [name] is a 63 y.o. year old female who sees Caroline More, DO for primary care. LCSW was consulted by Dr. Hartford Poli for information /resources to assistance patient with ongoing Mental Health Counseling resources. Patient is established with Dr. Hartford Poli for brief therapeutic interventions. Ms. Swartzentruber was not seen by LCSW during this encounter, however LCSW reviewed chart, notes, insurance and collaboration with Dr. Hartford Poli .     Recommendation: After consultation with provider it is determined that patient may benefit from contacting her insurance provider [facility] for a list of in-network providers.   Intervention: Relevant information and resources discussed with provider including, mental health support provided by [facility] Medicare and contact information on the back of patient's card. Provider will discuss information with patient.  Plan:LCSW is available for further consultation after provider's conversation regarding recommendation.  No further follow up required by LCSW at this time.  Casimer Lanius, LCSW Clinical Social Worker [place] / [place]   [phone] 4:42 PM

## 2019-12-25 ENCOUNTER — Encounter: Payer: Self-pay | Admitting: Family Medicine

## 2020-01-04 ENCOUNTER — Other Ambulatory Visit: Payer: Self-pay

## 2020-01-04 ENCOUNTER — Telehealth: Payer: Self-pay | Admitting: Family Medicine

## 2020-01-04 ENCOUNTER — Encounter: Payer: Self-pay | Admitting: Family Medicine

## 2020-01-04 ENCOUNTER — Ambulatory Visit (INDEPENDENT_AMBULATORY_CARE_PROVIDER_SITE_OTHER): Payer: Medicare Other | Admitting: Family Medicine

## 2020-01-04 ENCOUNTER — Other Ambulatory Visit (HOSPITAL_COMMUNITY): Payer: Self-pay | Admitting: Family Medicine

## 2020-01-04 ENCOUNTER — Other Ambulatory Visit: Payer: Self-pay | Admitting: Family Medicine

## 2020-01-04 VITALS — BP 142/90 | HR 84 | Wt 253.4 lb

## 2020-01-04 DIAGNOSIS — M79661 Pain in right lower leg: Secondary | ICD-10-CM | POA: Diagnosis not present

## 2020-01-04 DIAGNOSIS — R600 Localized edema: Secondary | ICD-10-CM | POA: Diagnosis not present

## 2020-01-04 DIAGNOSIS — I10 Essential (primary) hypertension: Secondary | ICD-10-CM

## 2020-01-04 DIAGNOSIS — F439 Reaction to severe stress, unspecified: Secondary | ICD-10-CM | POA: Diagnosis not present

## 2020-01-04 NOTE — Assessment & Plan Note (Signed)
Patient with right lower extremity edema as well as calf pain.  Negative Homans' sign.  However cannot rule out DVT.  Patient has a history of endometrial cancer but is currently in remission.  Attempted to get ultrasound today however given that it is Friday afternoon: Was unable to do this.  Given that we are unable to get any imaging today we will get a stat D-dimer.  If D-dimer is positive patient will need to come to the emergency department for DVT work-up.  If negative it is likely not a DVT and patient can just follow-up.  I have also called the inpatient team to [place] them of this patient.  Strict return precautions given.  Can consider musculoskeletal etiology given that patient was recently moving furniture.  If this is musculoskeletal advised heating pads and muscle rub creams as needed.  Follow-up if no improvement.

## 2020-01-04 NOTE — Assessment & Plan Note (Signed)
Normotensive today.  Will continue lisinopril 5 mg.  Follow-up in 3 months.

## 2020-01-04 NOTE — Patient Instructions (Signed)
It was a pleasure seeing you today.   Today we discussed your leg pain  For your leg pain: I have ordered an ultrasound.  We will schedule this for you before you leave.  For your blood pressure: Continue the same medications.  Happy that you are finding such great success with Dr. Hartford Poli, continue to see her   The Covid vaccine.  I am unsure what will happen if you get the code vaccine.  If you are still interested when you schedule appointment I would just make sure that someone is there to walk you afterwards to make sure you do not have anaphylaxis.  Please follow up in 3 month or sooner if symptoms persist or worsen. Please call the clinic immediately if you have any concerns.   Our clinic's number is [phone]. Please call with questions or concerns.    Thank you,  Caroline More, DO

## 2020-01-04 NOTE — Assessment & Plan Note (Signed)
Patient with increased stress due to difficult relationship with her daughter.  Has been seeing Dr. Hartford Poli which has significantly helped.  Advised to continue with therapy sessions as this seems to be helping patient.  Patient seems to have a good support system with her sons.  Strict return precautions given.  Follow-up as needed.

## 2020-01-04 NOTE — Telephone Encounter (Signed)
On chart review I noticed that patient's D-dimer had not yet resulted despite this being a stat lab ordered 3 hours ago.  I contacted [facility] who was able to look up the sample and stated that the stat lab courier was unable to find a sample in the lock box.  I explained to them this was placed by the lab technician at our clinic.  The [facility] [employer] employee informed me that either the sample was lost or it could have been picked up by the routine lab courier as there are 2 careers for this company.  I called patient to discuss this.  I informed her of these possibilities.  I explained to her given concern of blood clot she should probably be evaluated in the emergency department tonight as we are unsure if this D-dimer will ever be resulted.  Patient expressed understanding and was appreciative of call.  Dalphine Handing, PGY-3 [facility] 01/04/2020 8:43 PM

## 2020-01-04 NOTE — Progress Notes (Signed)
Subjective:    Patient ID: Roberta [name], female    DOB: 1956-03-28, 64 y.o.   MRN: [id number]   CC: HTN, R calf pain, stress   HPI: Hypertension: - Medications: lisinopril 5mg   - Compliance: good - Denies any SOB, CP, vision changes, LE edema, medication SEs, or symptoms of hypotension  Right calf pain Patient presenting with right calf pain.  States that last week she was moving furniture and the area started become painful.  Hurts with any type of movement.  It is only in her calf.  Feels "twisted".  Does report some edema.  Has history of chronic ankle edema after a broken leg.  Denies any warmth.  Denies any fevers.  Stress Does report some increased stress in her life.  Her daughter is stressing her out.  He is currently watching her grandson and her daughter and her head different ideas of how to raise him.  Patient states she is somewhat religious but her daughter is not.  Daughter and her argue over her grandson frequently.  Patient is also stressed since daughter is not social distancing and she does not want daughter to get sick or for her to spread any illnesses to herself or her grandson.  Patient's sons have good support system and will defend her.  Patient also has been seeing Dr. Hartford Poli for this issue which she says helps a lot.   Objective:  BP (!) 142/90   Pulse 84   Wt 253 lb 6.4 oz (114.9 kg)   SpO2 96%   BMI 40.90 kg/m  Vitals and nursing note reviewed  General: well nourished, in no acute distress HEENT: normocephalic, no scleral icterus or conjunctival pallor Neck: supple Cardiac: RRR, clear S1 and S2, no murmurs, rubs, or gallops Respiratory: clear to auscultation bilaterally, no increased work of breathing Abdomen: soft, nontender, nondistended, no masses or organomegaly. Bowel sounds present Extremities: RLE edema, no cyanosis. Warm, well perfused.  Negative Homans Skin: warm and dry, no rashes noted Neuro: alert and oriented, no focal deficits    Assessment & Plan:    Edema of right lower extremity Patient with right lower extremity edema as well as calf pain.  Negative Homans' sign.  However cannot rule out DVT.  Patient has a history of endometrial cancer but is currently in remission.  Attempted to get ultrasound today however given that it is Friday afternoon: Was unable to do this.  Given that we are unable to get any imaging today we will get a stat D-dimer.  If D-dimer is positive patient will need to come to the emergency department for DVT work-up.  If negative it is likely not a DVT and patient can just follow-up.  I have also called the inpatient team to [place] them of this patient.  Strict return precautions given.  Can consider musculoskeletal etiology given that patient was recently moving furniture.  If this is musculoskeletal advised heating pads and muscle rub creams as needed.  Follow-up if no improvement.  HYPERTENSION, BENIGN SYSTEMIC Normotensive today.  Will continue lisinopril 5 mg.  Follow-up in 3 months.  Stress Patient with increased stress due to difficult relationship with her daughter.  Has been seeing Dr. Hartford Poli which has significantly helped.  Advised to continue with therapy sessions as this seems to be helping patient.  Patient seems to have a good support system with her sons.  Strict return precautions given.  Follow-up as needed.    Return in about 3 months (around 04/03/2020).  Caroline More, DO, PGY-3

## 2020-01-07 ENCOUNTER — Ambulatory Visit (HOSPITAL_COMMUNITY): Payer: Medicare Other | Attending: Family Medicine

## 2020-01-07 ENCOUNTER — Encounter: Payer: Self-pay | Admitting: Family Medicine

## 2020-01-07 ENCOUNTER — Telehealth: Payer: Self-pay | Admitting: *Deleted

## 2020-01-07 NOTE — Telephone Encounter (Signed)
Geri wanted to let provider know that patient no showed her doppler appointment this morning. Bing Duffey,CMA

## 2020-01-07 NOTE — Telephone Encounter (Signed)
I have sent mychart message asking patient to please call to re-schedule vs. Have another d-dimer ordered. If patient calls please re-schedule this for her.  Dalphine Handing, PGY-3 [facility] 01/07/2020 7:47 PM

## 2020-01-08 DIAGNOSIS — H40013 Open angle with borderline findings, low risk, bilateral: Secondary | ICD-10-CM | POA: Diagnosis not present

## 2020-01-10 ENCOUNTER — Telehealth: Payer: Self-pay | Admitting: Family Medicine

## 2020-01-10 ENCOUNTER — Telehealth: Payer: Self-pay

## 2020-01-10 ENCOUNTER — Other Ambulatory Visit: Payer: Self-pay

## 2020-01-10 ENCOUNTER — Other Ambulatory Visit: Payer: Medicare Other

## 2020-01-10 ENCOUNTER — Other Ambulatory Visit (HOSPITAL_COMMUNITY): Payer: Medicare Other

## 2020-01-10 DIAGNOSIS — M79661 Pain in right lower leg: Secondary | ICD-10-CM

## 2020-01-10 DIAGNOSIS — R600 Localized edema: Secondary | ICD-10-CM | POA: Diagnosis not present

## 2020-01-10 NOTE — Telephone Encounter (Signed)
Called patient regarding rescheduling lab work for D-Dimer test. Patient scheduled for this afternoon at 2:00pm. Pt given number for [facility] to schedule [place] appointment.  To PCP  Talbot Grumbling, RN

## 2020-01-10 NOTE — Telephone Encounter (Signed)
Received staff message that D-dimer was 0.95. Age adjusted shows VTE unlikely. Patient also has [place] scheduled. Will await [place] results.   Dalphine Handing, PGY-3 [facility] 01/10/2020 7:19 PM

## 2020-01-11 ENCOUNTER — Ambulatory Visit (HOSPITAL_COMMUNITY)
Admission: RE | Admit: 2020-01-11 | Discharge: 2020-01-11 | Disposition: A | Discharge status: Home / Self Care | Payer: Medicare Other | Source: Ambulatory Visit | Attending: Family Medicine | Admitting: Family Medicine

## 2020-01-11 DIAGNOSIS — M79661 Pain in right lower leg: Secondary | ICD-10-CM | POA: Diagnosis not present

## 2020-01-11 LAB — D-DIMER, QUANTITATIVE (NOT AT ARMC): D-DIMER: 0.95 mg/L FEU — ABNORMAL HIGH (ref 0.00–0.49)

## 2020-01-11 NOTE — Progress Notes (Signed)
Right lower extremity venous duplex completed.  Preliminary results can be found under CV proc under chart review.  01/11/2020 2:53 PM  Aaden Buckman, K., RDMS, RVT

## 2020-01-14 ENCOUNTER — Telehealth: Payer: Self-pay | Admitting: *Deleted

## 2020-01-14 NOTE — Telephone Encounter (Signed)
Pt informed. Gilma Bessette, CMA  

## 2020-01-14 NOTE — Telephone Encounter (Signed)
-----   Message from Caroline More, DO sent at 01/11/2020  7:50 PM EST ----- Please inform patient that results of [place] are negative.

## 2020-01-15 NOTE — Telephone Encounter (Signed)
Performed on 01-11-20.  Azeez Dunker,CMA

## 2020-01-17 DIAGNOSIS — Z1231 Encounter for screening mammogram for malignant neoplasm of breast: Secondary | ICD-10-CM | POA: Diagnosis not present

## 2020-01-29 DIAGNOSIS — M533 Sacrococcygeal disorders, not elsewhere classified: Secondary | ICD-10-CM | POA: Diagnosis not present

## 2020-03-01 ENCOUNTER — Encounter: Payer: Self-pay | Admitting: Family Medicine

## 2020-03-10 NOTE — Progress Notes (Signed)
    SUBJECTIVE:   CHIEF COMPLAINT / HPI:   Ankle swelling Ms. Berardino is being seen to follow-up for her right lower extremity swelling.  Since her last visit, she had a lower extremity Doppler which showed no evidence of deep vein thrombus.  She has started wearing compression socks up to her knees the provided pressure of 25 mmHg.  These have been particularly helpful.  She wants to know if she can continue using HCTZ as an as needed medication for her leg swelling.  HTN She reports that she has a automatic blood pressure cuff that she uses at home occasionally.  She forgot to take her blood pressure medication yesterday but did take it this morning at 7 AM.  Health maintenance She reports that she had a DEXA scan performed 2 years ago by her orthopedist.  She is not interested in a pneumonia vaccine today.  COVID-19 vaccine Ms. Ertz had a small variety of questions regarding the necessity of the Covid 19 vaccination and if she should receive it if she may have already had a COVID-19 infection.  PERTINENT  PMH / PSH: See HPI  OBJECTIVE:   BP (!) 142/86   Pulse 66   Ht 5\' 6"  (1.676 m)   Wt 254 lb (115.2 kg)   SpO2 98%   BMI 41.00 kg/m    General: Seated comfortably in the exam table.  No acute distress. Respiratory: Breathing comfortably on room air.  Normal respiratory effort. Lower extremities: Compression stockings in place.  Once removed, there is trace edema present to the mid tibia on her right leg and without notable edema in the left leg.  ASSESSMENT/PLAN:   HYPERTENSION, BENIGN SYSTEMIC Elevated today though improved on repeat measurement later in the visit.  She reports not taking her lisinopril yesterday.  This may be the reason for elevated blood pressure today.  She is encouraged to check her blood pressure daily and to message me in 1 week's time with her daily readings to see if she would benefit from an increase in her blood pressure medication. -Begin taking  lisinopril at night -Follow-up BMP  Localized swelling of right lower extremity Previously assessed for DVT.  She was informed that as needed blood pressure medication is not the preferred therapy to treat localized lower extremity swelling.  She is encouraged to continue using her compression socks.  She was agreeable to this therapy. -Follow-up CBC, TSH, BMP   COVID-19 vaccine All of her questions were answered with regard to the COVID-19 vaccine.  She was given instructions on how to sign up for vaccine with the [place] [employer].  Matilde Haymaker, MD [place]

## 2020-03-11 ENCOUNTER — Encounter: Payer: Self-pay | Admitting: Family Medicine

## 2020-03-11 ENCOUNTER — Ambulatory Visit (INDEPENDENT_AMBULATORY_CARE_PROVIDER_SITE_OTHER): Payer: Medicare Other | Admitting: Family Medicine

## 2020-03-11 ENCOUNTER — Other Ambulatory Visit: Payer: Self-pay

## 2020-03-11 ENCOUNTER — Ambulatory Visit: Payer: Medicare Other | Admitting: Family Medicine

## 2020-03-11 VITALS — BP 142/86 | HR 66 | Ht 66.0 in | Wt 254.0 lb

## 2020-03-11 DIAGNOSIS — R2241 Localized swelling, mass and lump, right lower limb: Secondary | ICD-10-CM | POA: Insufficient documentation

## 2020-03-11 DIAGNOSIS — I10 Essential (primary) hypertension: Secondary | ICD-10-CM

## 2020-03-11 NOTE — Assessment & Plan Note (Signed)
Elevated today though improved on repeat measurement later in the visit.  She reports not taking her lisinopril yesterday.  This may be the reason for elevated blood pressure today.  She is encouraged to check her blood pressure daily and to message me in 1 week's time with her daily readings to see if she would benefit from an increase in her blood pressure medication. -Begin taking lisinopril at night -Follow-up BMP

## 2020-03-11 NOTE — Assessment & Plan Note (Addendum)
Previously assessed for DVT.  She was informed that as needed blood pressure medication is not the preferred therapy to treat localized lower extremity swelling.  She is encouraged to continue using her compression socks.  She was agreeable to this therapy. -Follow-up CBC, TSH, BMP

## 2020-03-11 NOTE — Patient Instructions (Addendum)
With regard to your right-sided leg swelling, I recommend continuing the compression socks that you are already using. This is somewhat safer and ultimately less expensive than trying to use an as needed blood pressure medication to reduce your swelling.  For your blood pressure, we will recheck your blood pressure today. Start taking your blood pressure medication at night. Please also start checking your blood pressure and once in the morning for the next week. Message me with those readings in 1 week and I will let you know if we should adjust your blood pressure medication.  To Center for the Covid vaccine, go to the website for [facility], that is the [place]. Look for the link that is COVID-19 vaccine information. Follow the link to find out your vaccination group until you find but it is signing up for a vaccine.  I will let you know if there is anything abnormal in your blood work today.

## 2020-03-12 LAB — TSH: TSH: 1.84 u[IU]/mL (ref 0.450–4.500)

## 2020-03-12 LAB — CBC
Hematocrit: 39.7 % (ref 34.0–46.6)
Hemoglobin: 12.7 g/dL (ref 11.1–15.9)
MCH: 26 pg — ABNORMAL LOW (ref 26.6–33.0)
MCHC: 32 g/dL (ref 31.5–35.7)
MCV: 81 fL (ref 79–97)
Platelets: 316 10*3/uL (ref 150–450)
RBC: 4.88 x10E6/uL (ref 3.77–5.28)
RDW: 13.7 % (ref 11.7–15.4)
WBC: 5.9 10*3/uL (ref 3.4–10.8)

## 2020-03-12 LAB — BASIC METABOLIC PANEL
BUN/Creatinine Ratio: 12 (ref 12–28)
BUN: 12 mg/dL (ref 8–27)
CO2: 23 mmol/L (ref 20–29)
Calcium: 9.4 mg/dL (ref 8.7–10.3)
Chloride: 105 mmol/L (ref 96–106)
Creatinine, Ser: 0.98 mg/dL (ref 0.57–1.00)
GFR calc Af Amer: 70 mL/min/{1.73_m2} (ref >59)
GFR calc non Af Amer: 61 mL/min/{1.73_m2} (ref >59)
Glucose: 110 mg/dL — ABNORMAL HIGH (ref 65–99)
Potassium: 4.3 mmol/L (ref 3.5–5.2)
Sodium: 142 mmol/L (ref 134–144)

## 2020-03-19 ENCOUNTER — Encounter: Payer: Self-pay | Admitting: Family Medicine

## 2020-03-20 DIAGNOSIS — M7711 Lateral epicondylitis, right elbow: Secondary | ICD-10-CM | POA: Diagnosis not present

## 2020-03-20 DIAGNOSIS — M25521 Pain in right elbow: Secondary | ICD-10-CM | POA: Diagnosis not present

## 2020-04-03 ENCOUNTER — Other Ambulatory Visit: Payer: Self-pay | Admitting: Family Medicine

## 2020-04-03 DIAGNOSIS — R112 Nausea with vomiting, unspecified: Secondary | ICD-10-CM

## 2020-04-14 DIAGNOSIS — R350 Frequency of micturition: Secondary | ICD-10-CM | POA: Diagnosis not present

## 2020-04-14 DIAGNOSIS — Z124 Encounter for screening for malignant neoplasm of cervix: Secondary | ICD-10-CM | POA: Diagnosis not present

## 2020-04-29 DIAGNOSIS — M25552 Pain in left hip: Secondary | ICD-10-CM | POA: Diagnosis not present

## 2020-04-29 DIAGNOSIS — M25571 Pain in right ankle and joints of right foot: Secondary | ICD-10-CM | POA: Diagnosis not present

## 2020-04-29 DIAGNOSIS — M25561 Pain in right knee: Secondary | ICD-10-CM | POA: Diagnosis not present

## 2020-04-29 DIAGNOSIS — M25572 Pain in left ankle and joints of left foot: Secondary | ICD-10-CM | POA: Diagnosis not present

## 2020-04-30 ENCOUNTER — Encounter: Payer: Self-pay | Admitting: Family Medicine

## 2020-06-13 DIAGNOSIS — R3121 Asymptomatic microscopic hematuria: Secondary | ICD-10-CM | POA: Diagnosis not present

## 2020-06-13 DIAGNOSIS — R310 Gross hematuria: Secondary | ICD-10-CM | POA: Diagnosis not present

## 2020-07-22 DIAGNOSIS — M47816 Spondylosis without myelopathy or radiculopathy, lumbar region: Secondary | ICD-10-CM | POA: Diagnosis not present

## 2020-07-22 DIAGNOSIS — H401131 Primary open-angle glaucoma, bilateral, mild stage: Secondary | ICD-10-CM | POA: Diagnosis not present

## 2020-08-08 ENCOUNTER — Encounter: Payer: Self-pay | Admitting: Family Medicine

## 2020-08-08 ENCOUNTER — Ambulatory Visit (INDEPENDENT_AMBULATORY_CARE_PROVIDER_SITE_OTHER): Payer: Medicare Other | Admitting: Family Medicine

## 2020-08-08 ENCOUNTER — Other Ambulatory Visit: Payer: Self-pay

## 2020-08-08 VITALS — BP 136/90 | HR 82 | Wt 255.6 lb

## 2020-08-08 DIAGNOSIS — E785 Hyperlipidemia, unspecified: Secondary | ICD-10-CM | POA: Diagnosis not present

## 2020-08-08 DIAGNOSIS — I10 Essential (primary) hypertension: Secondary | ICD-10-CM | POA: Diagnosis not present

## 2020-08-08 DIAGNOSIS — Z0000 Encounter for general adult medical examination without abnormal findings: Secondary | ICD-10-CM | POA: Diagnosis not present

## 2020-08-08 DIAGNOSIS — R112 Nausea with vomiting, unspecified: Secondary | ICD-10-CM | POA: Diagnosis not present

## 2020-08-08 DIAGNOSIS — J45909 Unspecified asthma, uncomplicated: Secondary | ICD-10-CM | POA: Diagnosis not present

## 2020-08-08 MED ORDER — ALBUTEROL SULFATE HFA 108 (90 BASE) MCG/ACT IN AERS: 2.0000 | INHALATION_SPRAY | RESPIRATORY_TRACT | 3 refills | Status: DC | PRN

## 2020-08-08 MED ORDER — ONDANSETRON HCL 4 MG PO TABS: ORAL_TABLET | ORAL | 0 refills | Status: DC

## 2020-08-08 NOTE — Patient Instructions (Addendum)
It was very nice to meet you today. Please enjoy the rest of your week.   Today you were seen for a BP recheck. Continue to check your BP if you have symptoms high blood pressure such as change in vision or headache.  Your blood pressure goal is less than 140/90.  We also check your cholesterol levels.  With normal results of her communicate via [place].  If abnormal I will call you.  Continue to follow-up with urology.  I have refilled your albuterol and Zofran.  Please call the clinic at [phone] if your symptoms worsen or you have any concerns. It was our pleasure to serve you.

## 2020-08-08 NOTE — Progress Notes (Signed)
    SUBJECTIVE:   CHIEF COMPLAINT / HPI:   Ms. Podolak is a 64 yo F who presents for the issue below.   Hypertension: - Medications: Lisinopril 5 mg daily - Compliance: Yes - Checking BP at home: Yes, Avg. 130-140/70-80 - Denies any SOB, CP, vision changes. Has chronic lower extremity swelling. Endorse compression socks, elevation, and supportive shoes. - Exercise: Walks frequently   Health maintenance -Mammogram: 2018 -Lipid panel: 2013  -Hgb A1c 6.4 2020 -COVID vaccine: Never; reaction to flu vaccine -PNA vaccine: Declined due to reasons above -Dexa Scan: never  PERTINENT  PMH / PSH: Asthma, GERD, Benign breast mass, Prediabetes, Endometrial cancer (TAH w/ BSO 2016), Urinary incontinence (sees urology every 3 months)  OBJECTIVE:   BP 136/90   Pulse 82   Wt 255 lb 9.6 oz (115.9 kg)   SpO2 96%   BMI 41.25 kg/m   General: Appears well, no acute distress. Age appropriate. Cardiac: RRR, normal heart sounds, no murmurs Respiratory: CTAB, normal effort Extremities: Mild BLE +1 pitting edema  ASSESSMENT/PLAN:   HYPERTENSION, BENIGN SYSTEMIC BP at goal <140/90. Continue current medications.  -F/u PRN   Healthcare maintenance -Mammogram ordered, breast imaging sheet given to patient to schedule -Lipid panel ordered: ASCVD risk 10.4% indicating the need for a mod-high intensity statin. Consider starting statin in the future. Patient would like to make lifestyle modification changes.    Gerlene Fee, [place]

## 2020-08-09 LAB — LIPID PANEL
Chol/HDL Ratio: 4.3 ratio (ref 0.0–4.4)
Cholesterol, Total: 187 mg/dL (ref 100–199)
HDL: 44 mg/dL (ref >39)
LDL Chol Calc (NIH): 122 mg/dL — ABNORMAL HIGH (ref 0–99)
Triglycerides: 114 mg/dL (ref 0–149)
VLDL Cholesterol Cal: 21 mg/dL (ref 5–40)

## 2020-08-10 ENCOUNTER — Encounter: Payer: Self-pay | Admitting: Family Medicine

## 2020-08-11 ENCOUNTER — Encounter: Payer: Self-pay | Admitting: Family Medicine

## 2020-08-11 ENCOUNTER — Telehealth: Payer: Self-pay | Admitting: Family Medicine

## 2020-08-11 NOTE — Assessment & Plan Note (Addendum)
-  Mammogram ordered, breast imaging sheet given to patient to schedule -Lipid panel ordered: ASCVD risk 10.4% indicating the need for a mod-high intensity statin. Consider starting statin in the future. Patient would like to make lifestyle modification changes.

## 2020-08-11 NOTE — Assessment & Plan Note (Signed)
BP at goal <140/90. Continue current medications.  -F/u PRN

## 2020-08-11 NOTE — Telephone Encounter (Signed)
Called patient after receiving mychart message. Discussed lipid results. She would like to bring her LDL down with diet and exercise for now instead of a statin. Believes she tried a statin before and it did not make her feel well. Gave the resource of [employer] [facility] [employer] for patient to consider since she enjoys walking. She voiced appreciation.   Abdi Husak Autry-Lott, DO 08/11/2020, 12:17 PM PGY-2, [place]

## 2020-08-20 DIAGNOSIS — M47816 Spondylosis without myelopathy or radiculopathy, lumbar region: Secondary | ICD-10-CM | POA: Diagnosis not present

## 2020-09-08 ENCOUNTER — Telehealth: Payer: Self-pay | Admitting: *Deleted

## 2020-09-08 ENCOUNTER — Encounter: Payer: Self-pay | Admitting: Family Medicine

## 2020-09-08 DIAGNOSIS — I10 Essential (primary) hypertension: Secondary | ICD-10-CM

## 2020-09-08 MED ORDER — LISINOPRIL 5 MG PO TABS: ORAL_TABLET | ORAL | 3 refills | Status: DC

## 2020-09-08 NOTE — Telephone Encounter (Signed)
Returned patient's call and scheduled a follow up appt for next week

## 2020-09-11 DIAGNOSIS — R3121 Asymptomatic microscopic hematuria: Secondary | ICD-10-CM | POA: Diagnosis not present

## 2020-09-16 ENCOUNTER — Other Ambulatory Visit: Payer: Self-pay

## 2020-09-16 ENCOUNTER — Encounter: Payer: Self-pay | Admitting: Gynecologic Oncology

## 2020-09-16 ENCOUNTER — Inpatient Hospital Stay: Payer: Medicare Other | Attending: Gynecologic Oncology | Admitting: Gynecologic Oncology

## 2020-09-16 VITALS — BP 143/93 | HR 75 | Temp 97.8°F | Resp 20

## 2020-09-16 DIAGNOSIS — E785 Hyperlipidemia, unspecified: Secondary | ICD-10-CM | POA: Diagnosis not present

## 2020-09-16 DIAGNOSIS — Z6841 Body Mass Index (BMI) 40.0 and over, adult: Secondary | ICD-10-CM | POA: Insufficient documentation

## 2020-09-16 DIAGNOSIS — J4520 Mild intermittent asthma, uncomplicated: Secondary | ICD-10-CM | POA: Diagnosis not present

## 2020-09-16 DIAGNOSIS — C541 Malignant neoplasm of endometrium: Secondary | ICD-10-CM | POA: Diagnosis present

## 2020-09-16 DIAGNOSIS — Z79899 Other long term (current) drug therapy: Secondary | ICD-10-CM | POA: Insufficient documentation

## 2020-09-16 DIAGNOSIS — Z90722 Acquired absence of ovaries, bilateral: Secondary | ICD-10-CM | POA: Insufficient documentation

## 2020-09-16 DIAGNOSIS — K219 Gastro-esophageal reflux disease without esophagitis: Secondary | ICD-10-CM | POA: Diagnosis not present

## 2020-09-16 DIAGNOSIS — Z90710 Acquired absence of both cervix and uterus: Secondary | ICD-10-CM | POA: Diagnosis not present

## 2020-09-16 DIAGNOSIS — I10 Essential (primary) hypertension: Secondary | ICD-10-CM | POA: Diagnosis not present

## 2020-09-16 DIAGNOSIS — H409 Unspecified glaucoma: Secondary | ICD-10-CM | POA: Diagnosis not present

## 2020-09-16 NOTE — Progress Notes (Signed)
Gynecologic Oncology Follow-up  Chief Complaint:  Chief Complaint  Patient presents with  . Endometrial cancer    follow up    Assessment:    64 y.o. year old with Stage IA Grade 1 endometrioid endometrial cancer.   S/p robotic assisted total hysterectomy, BSO and bilateral SLN biopsy on 09/04/15. no LVSI, 5% myometrial invasion, neg pelvic washings and negative lymph nodes. Morbid obesity (BMI 40kg/m2)  Plan: Endometrial cancer: no evidence for recurrence. Counseled regarding symptoms consistent with recurrence. She has no evidence of disease at 5 years s/p intervention therefore she is released from cancer-specific follow-up. It is reasonable for her to have annual checks with her general gynecologist or primary care physician, but does not require evaluation for cancer recurrence.   We will work to see if it is possible to administer the COVID19 vaccine here in clinic (on a morning) with 30 minute observation following the vaccine.   HPI:  Roberta [name] is a 64 y.o. year old [id number] initially seen in consultation on 08/11/15 for endometrial cancer.  She then underwent a robotic hysterectomy, BSO and bilateral sentinel node biopsy on 6/72/09 without complications.  Her postoperative course was uncomplicated.  Her final pathologic diagnosis is a Stage IA Grade 1 endometrioid endometrial cancer with no lymphovascular space invasion, 1/17 mm (5%) of myometrial invasion and negative lymph nodes (SLN's were removed from bilateral basins, however, no nodal tissue was removed from the left SLN specimen, therefore nodal evaluation was only complete on the right).  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   Interval Hx: She is seen today for a routine cancer surveillance visit.  She has no complaints today.  She is interested in obtaining the moderna COVID19 vaccine but needs to get this in a hospital based clinic setting due to a history of anaphylaxis with prior flu  vaccine.   Current Outpatient Medications on File Prior to Visit  Medication Sig Dispense Refill  . albuterol (PROAIR HFA) 108 (90 Base) MCG/ACT inhaler Inhale 2 puffs into the lungs every 4 (four) hours as needed. 18 g 3  . albuterol (PROVENTIL) (2.5 MG/3ML) 0.083% nebulizer solution Take 3 mLs (2.5 mg total) by nebulization every 6 (six) hours as needed for wheezing or shortness of breath. 75 mL 1  . Ascorbic Acid (VITAMIN C PO) Take by mouth.    . cyclobenzaprine (FLEXERIL) 10 MG tablet Take 10 mg daily as needed by mouth for muscle spasms.    . dorzolamide-timolol (COSOPT) 22.3-6.8 MG/ML ophthalmic solution Place 1 drop into both eyes 2 (two) times daily. 10 mL 12  . esomeprazole (NEXIUM) 20 MG capsule Take 20 mg daily as needed by mouth (acid reflux).    Marland Kitchen lisinopril (ZESTRIL) 5 MG tablet TAKE 1 TABLET BY MOUTH DAILY 90 tablet 3  . meclizine (ANTIVERT) 12.5 MG tablet Take 1 tablet (12.5 mg total) by mouth 3 (three) times daily as needed. 90 tablet 0  . Multiple Vitamins-Minerals (HAIR SKIN AND NAILS FORMULA PO) Take 1 tablet 3 (three) times a week by mouth.     . ondansetron (ZOFRAN) 4 MG tablet TAKE 1 TABLET BY MOUTH EVERY 8 HOURS AS NEEDED FOR NAUSEA AND VOMITING 20 tablet 0  . traMADol (ULTRAM) 50 MG tablet Take 50 mg daily as needed by mouth for moderate pain.    . Travoprost, BAK Free, (TRAVATAN) 0.004 % SOLN ophthalmic solution Place 1 drop into both eyes at bedtime. 2.5 mL 3   No current  facility-administered medications on file prior to visit.   Allergies  Allergen Reactions  . Influenza Vaccines Anaphylaxis    Throat swelling reported after flu shot in the past (cannot verify with records but would not give)  . Budesonide Other (See Comments)    Throat infection per patient  . Flonase [Fluticasone Propionate] Other (See Comments)    Upper respitory  issues  . Codeine Nausea And Vomiting  . Morphine And Related Nausea And Vomiting  . Nickel Rash   Past Medical History:   Diagnosis Date  . Abnormal EKG    nonspecific ST and T-wave changes - Followed by Dr.Berry  . ALLERGIC RHINITIS 10/14/2010  . Anemia   . Asthma   . ASTHMA, INTERMITTENT 02/16/2007  . BACK PAIN, CHRONIC 03/25/2009  . CERVICAL RADICULOPATHY 10/16/2009  . DDD (degenerative disc disease), lumbosacral   . Depression   . DEPRESSIVE DISORDER NOT ELSEWHERE CLASSIFIED 03/25/2009  . Endometrial cancer ([place])   . GERD (gastroesophageal reflux disease) 09/09/2011  . Glaucoma    sees optho every 3 months, drops each night  . Hyperlipidemia   . Hypertension   . PONV (postoperative nausea and vomiting)   . Postmenopausal vaginal bleeding 09/24/2014   Past Surgical History:  Procedure Laterality Date  . ANKLE SURGERY    . BIOPSY BREAST     LEFT  . BREAST CYST INCISION AND DRAINAGE     under breast  . BUNIONECTOMY     bilateral and toe correction  . CARPAL TUNNEL RELEASE Bilateral 2009  . CATARACT SURGERY    . CERVICAL SPINE SURGERY    . CHOLECYSTECTOMY  1980  . DILATATION & CURRETTAGE/HYSTEROSCOPY WITH RESECTOCOPE N/A 07/28/2015   Procedure: DILATATION & CURETTAGE/HYSTEROSCOPY WITH RESECTOCOPE;  Surgeon: Eldred Manges, MD;  Location: [facility];  Service: Gynecology;  Laterality: N/A;  . KNEE ARTHROSCOPY    . ROBOTIC ASSISTED TOTAL HYSTERECTOMY WITH BILATERAL SALPINGO OOPHERECTOMY Bilateral 09/04/2015   Procedure: ROBOTIC ASSISTED HYSTERECTOMY WITH BILATERAL SALPINGO OOPHORECTOMY SENTINAL NODE MAPPING ;  Surgeon: Everitt Amber, MD;  Location: [facility];  Service: Gynecology;  Laterality: Bilateral;  . ROTATOR CUFF REPAIR Right May 2014   Family History  Problem Relation Age of Onset  . Heart disease Mother        MI in 17s  . Lymphoma Mother        related to asbestos  . Cancer Mother        lymphoma (asbestos exposure)  . Alcoholism Father   . Cirrhosis Father        due to alcohol  . Asthma Father   . Cancer Maternal Uncle        lung  . Cancer Maternal Uncle        lung   Social History    Socioeconomic History  . Marital status: Divorced    Spouse name: Not on file  . Number of children: 4  . Years of education: 134  . Highest education level: Not on file  Occupational History  . Occupation: DISABLED    Employer: UNEMPLOYED  . Occupation: Previously a Quarry manager  Tobacco Use  . Smoking status: Never Smoker  . Smokeless tobacco: Never Used  Vaping Use  . Vaping Use: Never used  Substance and Sexual Activity  . Alcohol use: No    Alcohol/week: 0.0 standard drinks  . Drug use: No  . Sexual activity: Never  Other Topics Concern  . Not on file  Social History Narrative   Patient is a  student at [employer] getting a double major (art major and online   Criminal investigation). Part time student due to learning disability. 4 kids.       On disability.       College education. Lives alone. No stairs.             Social Determinants of Health   Financial Resource Strain:   . Difficulty of Paying Living Expenses: Not on file  Food Insecurity:   . Worried About Charity fundraiser in the Last Year: Not on file  . Ran Out of Food in the Last Year: Not on file  Transportation Needs:   . Lack of Transportation (Medical): Not on file  . Lack of Transportation (Non-Medical): Not on file  Physical Activity:   . Days of Exercise per Week: Not on file  . Minutes of Exercise per Session: Not on file  Stress:   . Feeling of Stress : Not on file  Social Connections:   . Frequency of Communication with Friends and Family: Not on file  . Frequency of Social Gatherings with Friends and Family: Not on file  . Attends Religious Services: Not on file  . Active Member of Clubs or Organizations: Not on file  . Attends Archivist Meetings: Not on file  . Marital Status: Not on file  Intimate Partner Violence:   . Fear of Current or Ex-Partner: Not on file  . Emotionally Abused: Not on file  . Physically Abused: Not on file  . Sexually Abused: Not  on file     Review of systems: Constitutional:  She has no weight gain or weight loss. She has no fever or chills. Eyes: No blurred vision Ears, Nose, Mouth, Throat: No dizziness, headaches or changes in hearing. No mouth sores. Cardiovascular: No chest pain, palpitations or edema. Respiratory:  No shortness of breath, wheezing or cough Gastrointestinal: She has normal bowel movements without diarrhea or constipation. She denies any nausea or vomiting. She denies blood in her stool or heart burn. no abdominal/low flank pain Genitourinary:  She denies pelvic pain, pelvic pressure or changes in her urinary function. She has no hematuria, dysuria, or incontinence. She has no irregular vaginal bleeding or vaginal discharge Musculoskeletal: Denies muscle weakness or joint pains.  Skin:  She has no skin changes, rashes or itching Neurological:  Denies dizziness or headaches. No neuropathy, no numbness or tingling. Psychiatric:  She denies depression or anxiety. Hematologic/Lymphatic:   No easy bruising or bleeding   Physical Exam: WD in NAD Neck  Supple NROM, without any enlargements.  Lymph Node Survey No cervical supraclavicular or inguinal adenopathy Cardiovascular  Pulse normal rate, regularity and rhythm. S1 and S2 normal.  Lungs  Clear to auscultation bilateraly, without wheezes/crackles/rhonchi. Good air movement.  Skin  No rash/lesions/breakdown  Psychiatry  Alert and oriented to person, place, and time  Abdomen  Normoactive bowel sounds, abdomen soft, non-tender and obese without evidence of hernia. Soft incisions. Back No CVA tenderness Genito Urinary  Vagina with some prolapse, but no lesions. Rectal  deferred  Extremities  No bilateral cyanosis, clubbing or edema.  Thereasa Solo, MD

## 2020-09-16 NOTE — Patient Instructions (Signed)
You no longer require check ups at the [facility] as you have completed 5 years of follow-up for your stage 1 endometrial cancer and have had no recurrences. You do not need pap smears any more.  It is reasonable for your to follow-up with your regular gynecologist for ongoing wellness checks.  If you develop symptoms of a yeast infection of the vagina, the first step would be to try Monistat which can be purchased over-the-counter at the supermarket or pharmacy.  Dr. Serita Grit office will attempt to obtain Moderna vaccine for you for COVID-19 vaccination and let she know if this is possible for [place] to administer here in our clinic.

## 2020-09-18 ENCOUNTER — Telehealth: Payer: Self-pay | Admitting: *Deleted

## 2020-09-18 NOTE — Telephone Encounter (Signed)
Explained to the patient that [facility] only offers the [place] vaccine. Explained that once she is ready to have the vaccine to call the office back and we will schedule her for the infusion to be monitored per her PCP. Patient stated that she is waiting to her from her niece, who is coming to stay the night with her

## 2020-10-06 ENCOUNTER — Encounter: Payer: Self-pay | Admitting: Family Medicine

## 2020-10-08 ENCOUNTER — Other Ambulatory Visit: Payer: Self-pay | Admitting: Family Medicine

## 2020-10-08 MED ORDER — ESOMEPRAZOLE MAGNESIUM 20 MG PO CPDR
20.0000 mg | DELAYED_RELEASE_CAPSULE | Freq: Every day | ORAL | 0 refills | Status: DC | PRN
Start: 2020-10-08 — End: 2020-10-08

## 2020-10-10 ENCOUNTER — Other Ambulatory Visit: Payer: Self-pay

## 2020-10-10 ENCOUNTER — Encounter: Payer: Self-pay | Admitting: Family Medicine

## 2020-10-10 ENCOUNTER — Ambulatory Visit (INDEPENDENT_AMBULATORY_CARE_PROVIDER_SITE_OTHER): Payer: Medicare Other | Admitting: Family Medicine

## 2020-10-10 VITALS — BP 140/80 | HR 73 | Ht 66.0 in | Wt 253.4 lb

## 2020-10-10 DIAGNOSIS — K219 Gastro-esophageal reflux disease without esophagitis: Secondary | ICD-10-CM | POA: Diagnosis not present

## 2020-10-10 DIAGNOSIS — I781 Nevus, non-neoplastic: Secondary | ICD-10-CM

## 2020-10-10 MED ORDER — ESOMEPRAZOLE MAGNESIUM 20 MG PO CPDR
DELAYED_RELEASE_CAPSULE | ORAL | 1 refills | Status: DC
Start: 2020-10-10 — End: 2021-07-15

## 2020-10-10 NOTE — Progress Notes (Signed)
° ° °  SUBJECTIVE:   CHIEF COMPLAINT / HPI:   GERD:Patient here for medication refill. She denies abdominal pain, no N/V, no change in her bowel habit.   Spider vein: C/O right thigh vein popping following exercise at home. She had similar episode many years ago of her left calf vein popping out following exercise. She uses compression stockings as needed.  She denies any pain today.  No other concerns.  PERTINENT  PMH / PSH: PMX reviewed.  OBJECTIVE:   BP 140/80    Pulse 73    Ht 5\' 6"  (1.676 m)    Wt 253 lb 6 oz (114.9 kg)    SpO2 99%    BMI 40.90 kg/m   Physical Exam Vitals and nursing note reviewed.  Cardiovascular:     Rate and Rhythm: Normal rate and regular rhythm.     Heart sounds: Normal heart sounds. No murmur heard.   Pulmonary:     Effort: Pulmonary effort is normal. No respiratory distress.     Breath sounds: Normal breath sounds. No wheezing or rhonchi.  Abdominal:     General: Abdomen is flat. Bowel sounds are normal. There is no distension.     Palpations: Abdomen is soft. There is no mass.     Tenderness: There is no abdominal tenderness.  Musculoskeletal:     Right lower leg: No edema.     Left lower leg: No edema.  Skin:    Comments: Purplish, palpable, non tender vein on her right inner thigh.        ASSESSMENT/PLAN:   GERD (gastroesophageal reflux disease) Recent colonoscopy reviewed and discussed with her. I refilled her Nexium. F/U with PCP for further management.   Spider vein: Patient reassured, this is benign. Can monitor if asymptomatic. Discuss vascular specialist referral with PCP if it becomes bothersome. She agreed with the plan.  Andrena Mews, MD [place]

## 2020-10-10 NOTE — Assessment & Plan Note (Signed)
Recent colonoscopy reviewed and discussed with her. I refilled her Nexium. F/U with PCP for further management.

## 2020-10-10 NOTE — Patient Instructions (Addendum)
Varicose Veins Varicose veins are veins that have become enlarged, bulged, and twisted. They most often appear in the legs. What are the causes? This condition is caused by damage to the valves in the vein. These valves help blood return to your heart. When they are damaged and they stop working properly, blood may flow backward and back up in the veins near the skin, causing the veins to get larger and appear twisted. The condition can result from any issue that causes blood to back up, like pregnancy, prolonged standing, or obesity. What increases the risk? This condition is more likely to develop in people who are:  On their feet a lot.  Pregnant.  Overweight. What are the signs or symptoms? Symptoms of this condition include:  Bulging, twisted, and bluish veins.  A feeling of heaviness. This may be worse at the end of the day.  Leg pain. This may be worse at the end of the day.  Swelling in the leg.  Changes in skin color over the veins. How is this diagnosed? This condition may be diagnosed based on your symptoms, a physical exam, and an ultrasound test. How is this treated? Treatment for this condition may involve:  Avoiding sitting or standing in one position for long periods of time.  Wearing compression stockings. These stockings help to prevent blood clots and reduce swelling in the legs.  Raising (elevating) the legs when resting.  Losing weight.  Exercising regularly. If you have persistent symptoms or want to improve the way your varicose veins look, you may choose to have a procedure to close the varicose veins off or to remove them. Treatments to close off the veins include:  Sclerotherapy. In this treatment, a solution is injected into a vein to close it off.  Laser treatment. In this treatment, the vein is heated with a laser to close it off.  Radiofrequency vein ablation. In this treatment, an electrical current produced by radio waves is used to close  off the vein. Treatments to remove the veins include:  Phlebectomy. In this treatment, the veins are removed through small incisions made over the veins.  Vein ligation and stripping. In this treatment, incisions are made over the veins. The veins are then removed after being tied (ligated) with stitches (sutures). Follow these instructions at home: Activity  Walk as much as possible. Walking increases blood flow. This helps blood return to the heart and takes pressure off your veins. It also increases your cardiovascular strength.  Follow your health care provider's instructions about exercising.  Do not stand or sit in one position for a long period of time.  Do not sit with your legs crossed.  Rest with your legs raised during the day. General instructions   Follow any diet instructions given to you by your health care provider.  Wear compression stockings as directed by your health care provider. Do not wear other kinds of tight clothing around your legs, pelvis, or waist.  Elevate your legs at night to above the level of your heart.  If you get a cut in the skin over the varicose vein and the vein bleeds: ? Lie down with your leg raised. ? Apply firm pressure to the cut with a clean cloth until the bleeding stops. ? Place a bandage (dressing) on the cut. Contact a health care provider if:  The skin around your varicose veins starts to break down.  You have pain, redness, tenderness, or hard swelling over a vein.  You   are uncomfortable because of pain.  You get a cut in the skin over a varicose vein and it will not stop bleeding. Summary  Varicose veins are veins that have become enlarged, bulged, and twisted. They most often appear in the legs.  This condition is caused by damage to the valves in the vein. These valves help blood return to your heart.  Treatment for this condition includes frequent movements, wearing compression stockings, losing weight, and  exercising regularly. In some cases, procedures are done to close off or remove the veins.  Treatment for this condition may include wearing compression stockings, elevating the legs, losing weight, and engaging in regular activity. In some cases, procedures are done to close off or remove the veins. This information is not intended to replace advice given to you by your health care provider. Make sure you discuss any questions you have with your health care provider. Document Revised: 02/01/2019 Document Reviewed: 12/29/2016 Elsevier Patient Education  [place].  Gastroesophageal Reflux Disease, Adult Gastroesophageal reflux (GER) happens when acid from the stomach flows up into the tube that connects the mouth and the stomach (esophagus). Normally, food travels down the esophagus and stays in the stomach to be digested. With GER, food and stomach acid sometimes move back up into the esophagus. You may have a disease called gastroesophageal reflux disease (GERD) if the reflux:  Happens often.  Causes frequent or very bad symptoms.  Causes problems such as damage to the esophagus. When this happens, the esophagus becomes sore and swollen (inflamed). Over time, GERD can make small holes (ulcers) in the lining of the esophagus. What are the causes? This condition is caused by a problem with the muscle between the esophagus and the stomach. When this muscle is weak or not normal, it does not close properly to keep food and acid from coming back up from the stomach. The muscle can be weak because of:  Tobacco use.  Pregnancy.  Having a certain type of hernia (hiatal hernia).  Alcohol use.  Certain foods and drinks, such as coffee, chocolate, onions, and peppermint. What increases the risk? You are more likely to develop this condition if you:  Are overweight.  Have a disease that affects your connective tissue.  Use NSAID medicines. What are the signs or symptoms? Symptoms  of this condition include:  Heartburn.  Difficult or painful swallowing.  The feeling of having a lump in the throat.  A bitter taste in the mouth.  Bad breath.  Having a lot of saliva.  Having an upset or bloated stomach.  Belching.  Chest pain. Different conditions can cause chest pain. Make sure you see your doctor if you have chest pain.  Shortness of breath or noisy breathing (wheezing).  Ongoing (chronic) cough or a cough at night.  Wearing away of the surface of teeth (tooth enamel).  Weight loss. How is this treated? Treatment will depend on how bad your symptoms are. Your doctor may suggest:  Changes to your diet.  Medicine.  Surgery. Follow these instructions at home: Eating and drinking   Follow a diet as told by your doctor. You may need to avoid foods and drinks such as: ? Coffee and tea (with or without caffeine). ? Drinks that contain alcohol. ? Energy drinks and sports drinks. ? Bubbly (carbonated) drinks or sodas. ? Chocolate and cocoa. ? Peppermint and mint flavorings. ? Garlic and onions. ? Horseradish. ? Spicy and acidic foods. These include peppers, chili powder, curry powder,  vinegar, hot sauces, and BBQ sauce. ? Citrus fruit juices and citrus fruits, such as oranges, lemons, and limes. ? Tomato-based foods. These include red sauce, chili, salsa, and pizza with red sauce. ? Fried and fatty foods. These include donuts, french fries, potato chips, and high-fat dressings. ? High-fat meats. These include hot dogs, rib eye steak, sausage, ham, and bacon. ? High-fat dairy items, such as whole milk, butter, and cream cheese.  Eat small meals often. Avoid eating large meals.  Avoid drinking large amounts of liquid with your meals.  Avoid eating meals during the 2-3 hours before bedtime.  Avoid lying down right after you eat.  Do not exercise right after you eat. Lifestyle   Do not use any products that contain nicotine or tobacco. These  include cigarettes, e-cigarettes, and chewing tobacco. If you need help quitting, ask your doctor.  Try to lower your stress. If you need help doing this, ask your doctor.  If you are overweight, lose an amount of weight that is healthy for you. Ask your doctor about a safe weight loss goal. General instructions  Pay attention to any changes in your symptoms.  Take over-the-counter and prescription medicines only as told by your doctor. Do not take aspirin, ibuprofen, or other NSAIDs unless your doctor says it is okay.  Wear loose clothes. Do not wear anything tight around your waist.  Raise (elevate) the head of your bed about 6 inches (15 cm).  Avoid bending over if this makes your symptoms worse.  Keep all follow-up visits as told by your doctor. This is important. Contact a doctor if:  You have new symptoms.  You lose weight and you do not know why.  You have trouble swallowing or it hurts to swallow.  You have wheezing or a cough that keeps happening.  Your symptoms do not get better with treatment.  You have a hoarse voice. Get help right away if:  You have pain in your arms, neck, jaw, teeth, or back.  You feel sweaty, dizzy, or light-headed.  You have chest pain or shortness of breath.  You throw up (vomit) and your throw-up looks like blood or coffee grounds.  You pass out (faint).  Your poop (stool) is bloody or black.  You cannot swallow, drink, or eat. Summary  If a person has gastroesophageal reflux disease (GERD), food and stomach acid move back up into the esophagus and cause symptoms or problems such as damage to the esophagus.  Treatment will depend on how bad your symptoms are.  Follow a diet as told by your doctor.  Take all medicines only as told by your doctor. This information is not intended to replace advice given to you by your health care provider. Make sure you discuss any questions you have with your health care provider. Document  Revised: 06/14/2018 Document Reviewed: 06/14/2018 Elsevier Patient Education  [place].

## 2020-10-23 ENCOUNTER — Ambulatory Visit (INDEPENDENT_AMBULATORY_CARE_PROVIDER_SITE_OTHER): Payer: Medicare Other

## 2020-10-23 VITALS — BP 115/84

## 2020-10-23 DIAGNOSIS — Z0000 Encounter for general adult medical examination without abnormal findings: Secondary | ICD-10-CM | POA: Diagnosis not present

## 2020-10-23 NOTE — Patient Instructions (Signed)
You spoke to Roberta [name], Roberta [name] over the phone for your annual wellness visit.  We discussed goals: Goals    . Patient Stated     Continue vegetarian diet.     . Patient Stated     Get covid vaccine!       We also discussed recommended health maintenance. As discussed, you are pretty much up to date with everything! We will get you to sign a health maintenance release to obtain you DEXA scan. Talk with PCP about PNA Vaccine.   Health Maintenance  Topic Date Due  . COVID-19 Vaccine (1) Never done  . DEXA SCAN  Never done  . PNA vac Low Risk Adult (1 of 2 - PCV13) Never done  . PAP SMEAR-Modifier  09/26/2020  . COLONOSCOPY  08/25/2021  . MAMMOGRAM  01/16/2022  . TETANUS/TDAP  04/08/2029  . Hepatitis C Screening  Completed   Call to schedule you Covid Vaccine! Mammogram due January 2022.  Followup with PCP as needed.   Health Maintenance, Female Adopting a healthy lifestyle and getting preventive care are important in promoting health and wellness. Ask your health care provider about:  The right schedule for you to have regular tests and exams.  Things you can do on your own to prevent diseases and keep yourself healthy. What should I know about diet, weight, and exercise? Eat a healthy diet   Eat a diet that includes plenty of vegetables, fruits, low-fat dairy products, and lean protein.  Do not eat a lot of foods that are high in solid fats, added sugars, or sodium. Maintain a healthy weight Body mass index (BMI) is used to identify weight problems. It estimates body fat based on height and weight. Your health care provider can help determine your BMI and help you achieve or maintain a healthy weight. Get regular exercise Get regular exercise. This is one of the most important things you can do for your health. Most adults should:  Exercise for at least 150 minutes each week. The exercise should increase your heart rate and make you sweat (moderate-intensity  exercise).  Do strengthening exercises at least twice a week. This is in addition to the moderate-intensity exercise.  Spend less time sitting. Even light physical activity can be beneficial. Watch cholesterol and blood lipids Have your blood tested for lipids and cholesterol at 66 years of age, then have this test every 5 years. Have your cholesterol levels checked more often if:  Your lipid or cholesterol levels are high.  You are older than 66 years of age.  You are at high risk for heart disease. What should I know about cancer screening? Depending on your health history and family history, you may need to have cancer screening at various ages. This may include screening for:  Breast cancer.  Cervical cancer.  Colorectal cancer.  Skin cancer.  Lung cancer. What should I know about heart disease, diabetes, and high blood pressure? Blood pressure and heart disease  High blood pressure causes heart disease and increases the risk of stroke. This is more likely to develop in people who have high blood pressure readings, are of African descent, or are overweight.  Have your blood pressure checked: ? Every 3-5 years if you are 80-78 years of age. ? Every year if you are 56 years old or older. Diabetes Have regular diabetes screenings. This checks your fasting blood sugar level. Have the screening done:  Once every three years after age 11 if you are  at a normal weight and have a low risk for diabetes.  More often and at a younger age if you are overweight or have a high risk for diabetes. What should I know about preventing infection? Hepatitis B If you have a higher risk for hepatitis B, you should be screened for this virus. Talk with your health care provider to find out if you are at risk for hepatitis B infection. Hepatitis C Testing is recommended for:  Everyone born from [birth year] through 1967.  Anyone with known risk factors for hepatitis C. Sexually transmitted  infections (STIs)  Get screened for STIs, including gonorrhea and chlamydia, if: ? You are sexually active and are younger than 66 years of age. ? You are older than 66 years of age and your health care provider tells you that you are at risk for this type of infection. ? Your sexual activity has changed since you were last screened, and you are at increased risk for chlamydia or gonorrhea. Ask your health care provider if you are at risk.  Ask your health care provider about whether you are at high risk for HIV. Your health care provider may recommend a prescription medicine to help prevent HIV infection. If you choose to take medicine to prevent HIV, you should first get tested for HIV. You should then be tested every 3 months for as long as you are taking the medicine. Pregnancy  If you are about to stop having your period (premenopausal) and you may become pregnant, seek counseling before you get pregnant.  Take 400 to 800 micrograms (mcg) of folic acid every day if you become pregnant.  Ask for birth control (contraception) if you want to prevent pregnancy. Osteoporosis and menopause Osteoporosis is a disease in which the bones lose minerals and strength with aging. This can result in bone fractures. If you are 29 years old or older, or if you are at risk for osteoporosis and fractures, ask your health care provider if you should:  Be screened for bone loss.  Take a calcium or vitamin D supplement to lower your risk of fractures.  Be given hormone replacement therapy (HRT) to treat symptoms of menopause. Follow these instructions at home: Lifestyle  Do not use any products that contain nicotine or tobacco, such as cigarettes, e-cigarettes, and chewing tobacco. If you need help quitting, ask your health care provider.  Do not use street drugs.  Do not share needles.  Ask your health care provider for help if you need support or information about quitting drugs. Alcohol use  Do  not drink alcohol if: ? Your health care provider tells you not to drink. ? You are pregnant, may be pregnant, or are planning to become pregnant.  If you drink alcohol: ? Limit how much you use to 0-1 drink a day. ? Limit intake if you are breastfeeding.  Be aware of how much alcohol is in your drink. In the [place] one drink equals one 12 oz bottle of beer (355 mL), one 5 oz glass of wine (148 mL), or one 1 oz glass of hard liquor (44 mL). General instructions  Schedule regular health, dental, and eye exams.  Stay current with your vaccines.  Tell your health care provider if: ? You often feel depressed. ? You have ever been abused or do not feel safe at home. Summary  Adopting a healthy lifestyle and getting preventive care are important in promoting health and wellness.  Follow your health care provider's instructions about  healthy diet, exercising, and getting tested or screened for diseases.  Follow your health care provider's instructions on monitoring your cholesterol and blood pressure. This information is not intended to replace advice given to you by your health care provider. Make sure you discuss any questions you have with your health care provider. Document Revised: 11/29/2018 Document Reviewed: 11/29/2018 Elsevier Patient Education  2020 [place] clinic's number is [phone]. Please call with questions or concerns about what we discussed today.

## 2020-10-23 NOTE — Progress Notes (Signed)
Subjective:   Roberta [name] is a 64 y.o. female who presents for Medicare Annual (Subsequent) preventive examination.  Patient consented to have virtual visit and was identified by name and date of birth. Method of visit: Telephone  Encounter participants: Patient: Roberta [name] - located at Home Nurse/Provider: Dorna Bloom - located at [facility] Others (if applicable): NA  Time spent with patient: 40 minutes  Review of Systems: Defer to PCP.  Cardiac Risk Factors include: advanced age (>60mn, >>18women);hypertension  Objective:    Vitals: BP 115/84 Comment: Pateint reported- took BP while on the phone  There is no height or weight on file to calculate BMI.  Advanced Directives 10/23/2020 10/10/2020 09/16/2020 08/08/2020 05/18/2019 04/19/2019 04/08/2019  Does Patient Have a Medical Advance Directive? Yes No Yes No Yes Yes No  Type of Advance Directive Living will;Healthcare Power of AWalesLiving will - Living will Living will -  Does patient want to make changes to medical advance directive? No - Patient declined - No - Patient declined - - Yes (MAU/Ambulatory/Procedural Areas - Information given) -  Copy of HVirgilinain Chart? Yes - validated most recent copy scanned in chart (See row information) - No - copy requested - - - -  Would patient like information on creating a medical advance directive? No - Patient declined No - Patient declined - No - Patient declined - Yes (MAU/Ambulatory/Procedural Areas - Information given) No - Patient declined   Tobacco Social History   Tobacco Use  Smoking Status Never Smoker  Smokeless Tobacco Never Used     Clinical Intake:  Pre-visit preparation completed: Yes  Pain Score: 0-No pain  How often do you need to have someone help you when you read instructions, pamphlets, or other written materials from your doctor or pharmacy?: 1 - Never What is the last grade level you completed in  school?: College  Past Medical History:  Diagnosis Date  . Abnormal EKG    nonspecific ST and T-wave changes - Followed by Dr.Berry  . ALLERGIC RHINITIS 10/14/2010  . Anemia   . Asthma   . ASTHMA, INTERMITTENT 02/16/2007  . BACK PAIN, CHRONIC 03/25/2009  . CERVICAL RADICULOPATHY 10/16/2009  . DDD (degenerative disc disease), lumbosacral   . Depression   . DEPRESSIVE DISORDER NOT ELSEWHERE CLASSIFIED 03/25/2009  . Endometrial cancer ([facility]   . GERD (gastroesophageal reflux disease) 09/09/2011  . Glaucoma    sees optho every 3 months, drops each night  . Hyperlipidemia   . Hypertension   . PONV (postoperative nausea and vomiting)   . Postmenopausal vaginal bleeding 09/24/2014   Past Surgical History:  Procedure Laterality Date  . ANKLE SURGERY    . BIOPSY BREAST     LEFT  . BREAST CYST INCISION AND DRAINAGE     under breast  . BUNIONECTOMY     bilateral and toe correction  . CARPAL TUNNEL RELEASE Bilateral 2009  . CATARACT SURGERY    . CERVICAL SPINE SURGERY    . CHOLECYSTECTOMY  1980  . DILATATION & CURRETTAGE/HYSTEROSCOPY WITH RESECTOCOPE N/A 07/28/2015   Procedure: DILATATION & CURETTAGE/HYSTEROSCOPY WITH RESECTOCOPE;  Surgeon: VEldred Manges MD;  [facility];  Service: Gynecology;  Laterality: N/A;  . KNEE ARTHROSCOPY    . ROBOTIC ASSISTED TOTAL HYSTERECTOMY WITH BILATERAL SALPINGO OOPHERECTOMY Bilateral 09/04/2015   Procedure: ROBOTIC ASSISTED HYSTERECTOMY WITH BILATERAL SALPINGO OOPHORECTOMY SENTINAL NODE MAPPING ;  Surgeon: EEveritt Amber MD;  Location: [facility];  Service: Gynecology;  Laterality: Bilateral;  . ROTATOR CUFF REPAIR Right May 2014   Family History  Problem Relation Age of Onset  . Heart disease Mother        MI in 43s  . Lymphoma Mother        related to asbestos  . Cancer Mother        lymphoma (asbestos exposure)  . Alcoholism Father   . Cirrhosis Father        due to alcohol  . Asthma Father   . Cancer Maternal Uncle        lung  . Cancer  Maternal Uncle        lung   Social History   Socioeconomic History  . Marital status: Divorced    Spouse name: Not on file  . Number of children: 4  . Years of education: 16  . Highest education level: Bachelor's degree (e.g., BA, AB, BS)  Occupational History  . Occupation: DISABLED    Employer: UNEMPLOYED  Tobacco Use  . Smoking status: Never Smoker  . Smokeless tobacco: Never Used  Vaping Use  . Vaping Use: Never used  Substance and Sexual Activity  . Alcohol use: No    Alcohol/week: 0.0 standard drinks  . Drug use: No  . Sexual activity: Not Currently  Other Topics Concern  . Not on file  Social History Narrative   Patient lives alone in [place].   Patient is very close with her 4 grown children and her grandchildren.   Patient enjoys puzzles, building Lego cities, crafts, knitting blankets, and bible studies at home.    Patient has started a vegetarian diet.          Social Determinants of Health   Financial Resource Strain: Low Risk   . Difficulty of Paying Living Expenses: Not hard at all  Food Insecurity: No Food Insecurity  . Worried About Charity fundraiser in the Last Year: Never true  . Ran Out of Food in the Last Year: Never true  Transportation Needs: No Transportation Needs  . Lack of Transportation (Medical): No  . Lack of Transportation (Non-Medical): No  Physical Activity: Insufficiently Active  . Days of Exercise per Week: 3 days  . Minutes of Exercise per Session: 30 min  Stress: Stress Concern Present  . Feeling of Stress : To some extent  Social Connections: Moderately Isolated  . Frequency of Communication with Friends and Family: More than three times a week  . Frequency of Social Gatherings with Friends and Family: More than three times a week  . Attends Religious Services: More than 4 times per year  . Active Member of Clubs or Organizations: No  . Attends Archivist Meetings: Never  . Marital Status: Divorced    Outpatient Encounter Medications as of 10/23/2020  Medication Sig  . albuterol (PROAIR HFA) 108 (90 Base) MCG/ACT inhaler Inhale 2 puffs into the lungs every 4 (four) hours as needed.  Marland Kitchen albuterol (PROVENTIL) (2.5 MG/3ML) 0.083% nebulizer solution Take 3 mLs (2.5 mg total) by nebulization every 6 (six) hours as needed for wheezing or shortness of breath.  . Ascorbic Acid (VITAMIN C PO) Take by mouth.  . Aspirin (VAZALORE PO) Take 81 mg by mouth daily in the afternoon.  . cyclobenzaprine (FLEXERIL) 10 MG tablet Take 10 mg daily as needed by mouth for muscle spasms.  . dorzolamide-timolol (COSOPT) 22.3-6.8 MG/ML ophthalmic solution Place 1 drop into both eyes 2 (two) times daily.  Marland Kitchen esomeprazole (  NEXIUM) 20 MG capsule TAKE 1 CAPSULE(20 MG) BY MOUTH DAILY AS NEEDED FOR ACID REFLUX  . lisinopril (ZESTRIL) 5 MG tablet TAKE 1 TABLET BY MOUTH DAILY  . meclizine (ANTIVERT) 12.5 MG tablet Take 1 tablet (12.5 mg total) by mouth 3 (three) times daily as needed.  . Multiple Vitamins-Minerals (HAIR SKIN AND NAILS FORMULA PO) Take 1 tablet 3 (three) times a week by mouth.   . ondansetron (ZOFRAN) 4 MG tablet TAKE 1 TABLET BY MOUTH EVERY 8 HOURS AS NEEDED FOR NAUSEA AND VOMITING  . traMADol (ULTRAM) 50 MG tablet Take 50 mg daily as needed by mouth for moderate pain.  . Travoprost, BAK Free, (TRAVATAN) 0.004 % SOLN ophthalmic solution Place 1 drop into both eyes at bedtime.   No facility-administered encounter medications on file as of 10/23/2020.   Activities of Daily Living In your present state of health, do you have any difficulty performing the following activities: 10/23/2020  Hearing? N  Vision? N  Difficulty concentrating or making decisions? N  Walking or climbing stairs? N  Dressing or bathing? N  Doing errands, shopping? N  Preparing Food and eating ? N  Using the Toilet? N  In the past six months, have you accidently leaked urine? N  Do you have problems with loss of bowel control? N   Managing your Medications? N  Managing your Finances? N  Housekeeping or managing your Housekeeping? N  Some recent data might be hidden   Patient Care Team: Gerlene Fee, DO as PCP - General (Family Medicine) Normajean Glasgow, MD (Physical Medicine and Rehabilitation) Phylliss Bob, MD (Orthopedic Surgery) Tania Ade, MD (Orthopedic Surgery) Rutherford Guys, MD (Ophthalmology)    Assessment:   This is a routine wellness examination for Roberta [name].  Exercise Activities and Dietary recommendations Current Exercise Habits: Home exercise routine, Type of exercise: walking, Time (Minutes): 30, Frequency (Times/Week): 3, Weekly Exercise (Minutes/Week): 90, Exercise limited by: orthopedic condition(s)  Goals    . Patient Stated     Continue vegetarian diet.     . Patient Stated     Get covid vaccine!       Fall Risk Fall Risk  10/23/2020 10/10/2020 08/08/2020 03/11/2020 01/04/2020  Falls in the past year? 0 0 0 0 0  Number falls in past yr: - 0 0 - 0  Injury with Fall? - 0 0 - 0  Risk for fall due to : - - - - -   Is the patient's home free of loose throw rugs in walkways, pet beds, electrical cords, etc?   yes      Grab bars in the bathroom? yes      Handrails on the stairs?   yes      Adequate lighting?   yes  Patient rating of health (0-10) scale: 9  Depression Screen PHQ 2/9 Scores 10/23/2020 10/10/2020 10/10/2020 03/11/2020  PHQ - 2 Score 0 0 0 0  PHQ- 9 Score 0 0 0 -    Cognitive Function MMSE - Mini Mental State Exam 04/24/2014  Orientation to time 5  Orientation to Place 5  Registration 3  Attention/ Calculation 4  Recall 3  Language- name 2 objects 2  Language- repeat 1  Language- follow 3 step command 3  Language- read & follow direction 1  Write a sentence 1  Copy design 1  Total score 29   Immunization History  Administered Date(s) Administered  . Influenza Whole 10/30/2007, 10/02/2008, 10/05/2010  . MMR 04/19/1957, 04/22/1957, 04/29/1957  .  Td  12/20/2000  . Tdap 09/19/2017, 04/09/2019  . Varicella 04/19/1957, 04/22/1957, 04/29/1957   Screening Tests Health Maintenance  Topic Date Due  . COVID-19 Vaccine (1) Never done  . DEXA SCAN  Never done  . PNA vac Low Risk Adult (1 of 2 - PCV13) Never done  . PAP SMEAR-Modifier  09/26/2020  . COLONOSCOPY  08/25/2021  . MAMMOGRAM  01/16/2022  . TETANUS/TDAP  04/08/2029  . Hepatitis C Screening  Completed   Cancer Screenings: Lung: Low Dose CT Chest recommended if Age 67-80 years, 30 pack-year currently smoking OR have quit w/in 15years. Patient does not qualify. Breast:  Up to date on Mammogram? Yes Up to date of Bone Density/Dexa? Yes Colorectal: UTD  Additional Screenings: Hepatitis C Screening: Completed   Plan:  Call to schedule you Covid Vaccine! Mammogram due January 2022.  Followup with PCP as needed.   I have personally reviewed and noted the following in the patient's chart:   . Medical and social history . Use of alcohol, tobacco or illicit drugs  . Current medications and supplements . Functional ability and status . Nutritional status . Physical activity . Advanced directives . List of other physicians . Hospitalizations, surgeries, and ER visits in previous 12 months . Vitals . Screenings to include cognitive, depression, and falls . Referrals and appointments  In addition, I have reviewed and discussed with patient certain preventive protocols, quality metrics, and best practice recommendations. A written personalized care plan for preventive services as well as general preventive health recommendations were provided to patient.  This visit was conducted virtually in the setting of the [place] pandemic.    Dorna Bloom, [place]  10/23/2020

## 2020-11-05 ENCOUNTER — Telehealth: Payer: Self-pay

## 2020-11-05 NOTE — Telephone Encounter (Signed)
Received phone call on nurse line regarding COVID vaccine. Patient attempted to receive Moderna vaccine at local pharmacy, however, was turned away due to swollen glands under neck. Patient reports previous history of salivary stones. Patient is also receiving COVID testing tomorrow morning due to the swollen glands. Patient is otherwise asymptomatic.   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 Pharmacy is recommending patient have vaccine in clinic or hospital setting. Patient prefers to receive the Moderna vaccine.   Please advise safety of receiving vaccine due to anaphylactic history.   To PCP  Talbot Grumbling, RN

## 2020-11-06 ENCOUNTER — Other Ambulatory Visit: Payer: Medicare Other

## 2020-11-06 DIAGNOSIS — Z20822 Contact with and (suspected) exposure to covid-19: Secondary | ICD-10-CM | POA: Diagnosis not present

## 2020-11-07 LAB — NOVEL CORONAVIRUS, NAA: SARS-CoV-2, NAA: NOT DETECTED

## 2020-11-07 LAB — SARS-COV-2, NAA 2 DAY TAT

## 2020-11-07 NOTE — Telephone Encounter (Signed)
Hey I reviewed the prior reaction to the flu vaccine and I think we need to have an office visit to discuss risks and benefits of receiving the covid vaccine given her history. If we decide to move forward with the vaccine she should be able to get it that day as well.   Roberta Wenk Autry-Lott, DO 11/07/2020, 7:35 AM PGY-2, [place]

## 2020-11-07 NOTE — Telephone Encounter (Signed)
Patient scheduled for 11/29 to further discuss COVID vaccination.   Talbot Grumbling, RN

## 2020-11-07 NOTE — Telephone Encounter (Signed)
Thank you for getting this patient scheduled.   Britley Gashi Autry-Lott, DO 11/07/2020, 8:32 PM PGY-2, [place]

## 2020-11-16 NOTE — Progress Notes (Signed)
    SUBJECTIVE:   CHIEF COMPLAINT / HPI:   [name] is a 64 yo F who presents to discuss her options for the COVID vaccine.  Need for vaccine against COVID-19 Would like to receive vaccine to protect her family but has fears around it due to her history 2011 anaphylaxis episode with the flu vaccine as well as now deceased brother having received the vaccine and shortly after contracting meningitis. Would like to discuss options about how to safely go about receiving the vaccine. Attempted to get it at the pharmacy but was told she could not get it due to salivary gland stones.   Hypertension: - Medications: Lisinopril 5 mg daily listed but patient is now taking 10 mg  - Compliance: Yes - Checking BP at home: Yes wrist cuff today in office 139/97 reading.  - Denies any SOB, CP, vision changes, LE edema, medication SEs, or symptoms of hypotension - Diet: Endorsing recent salty food in take due to [place]  PMH / PSH: HLD, prediabetes  OBJECTIVE:   BP (!) 162/92   Pulse 85   SpO2 98%   General: Appears well, no acute distress. Age appropriate. Cardiac: RRR, normal heart sounds, no murmurs Respiratory: normal effort Extremities: No edema or cyanosis.  ASSESSMENT/PLAN:  Need for COVID-19 vaccine Hx of anaphylaxis with flu vaccine. Discussed that benefits of getting the COVID vaccine outweigh the risk and components between flu and covid vaccines are different. We would quarantine her for 30 minutes, crash cart available and hospital proximity if necessary. She would like to proceed with her son present.  -1st dose scheduled for  -Needs to be quarantined for 30 minutes  HYPERTENSION, BENIGN SYSTEMIC BP elevated today in office. Patient took 5 mg tablet this morning and 5 mg tablet just before OV.  -Continue lisinopril 10 mg daily, keep BP log -Needs BP check with follow up   Gerlene Fee, [place]

## 2020-11-17 ENCOUNTER — Ambulatory Visit (INDEPENDENT_AMBULATORY_CARE_PROVIDER_SITE_OTHER): Payer: Medicare Other | Admitting: Family Medicine

## 2020-11-17 ENCOUNTER — Other Ambulatory Visit: Payer: Self-pay

## 2020-11-17 ENCOUNTER — Encounter: Payer: Self-pay | Admitting: Family Medicine

## 2020-11-17 VITALS — BP 162/92 | HR 85

## 2020-11-17 DIAGNOSIS — Z23 Encounter for immunization: Secondary | ICD-10-CM

## 2020-11-17 DIAGNOSIS — I10 Essential (primary) hypertension: Secondary | ICD-10-CM

## 2020-11-17 NOTE — Patient Instructions (Signed)
It was wonderful to see you today.  Please bring ALL of your medications with you to every visit.   Today we talked about:  Getting scheduled to get your 1st COVID.   Your BP was elevated today. We will check your BP next week and make adjustments. Take 10mg  of lisinopril until then. We also discussed monitoring it and minimizing salty foods.   Please call the clinic at [phone] if your symptoms worsen or you have any concerns. It was our pleasure to serve you.  Dr. Janus Molder

## 2020-11-18 NOTE — Assessment & Plan Note (Signed)
BP elevated today in office. Patient took 5 mg tablet this morning and 5 mg tablet just before OV.  -Continue lisinopril 10 mg daily, keep BP log -Needs BP check with follow up

## 2020-11-20 ENCOUNTER — Encounter: Payer: Self-pay | Admitting: Family Medicine

## 2020-11-21 ENCOUNTER — Encounter: Payer: Self-pay | Admitting: Family Medicine

## 2020-11-21 DIAGNOSIS — I10 Essential (primary) hypertension: Secondary | ICD-10-CM

## 2020-11-21 DIAGNOSIS — M542 Cervicalgia: Secondary | ICD-10-CM | POA: Diagnosis not present

## 2020-11-21 DIAGNOSIS — R112 Nausea with vomiting, unspecified: Secondary | ICD-10-CM

## 2020-11-22 MED ORDER — LISINOPRIL 5 MG PO TABS: ORAL_TABLET | ORAL | 3 refills | Status: DC

## 2020-11-22 MED ORDER — MECLIZINE HCL 12.5 MG PO TABS: 12.5000 mg | ORAL_TABLET | Freq: Three times a day (TID) | ORAL | 0 refills | Status: DC | PRN

## 2020-11-22 MED ORDER — ONDANSETRON HCL 4 MG PO TABS: ORAL_TABLET | ORAL | 0 refills | Status: DC

## 2020-11-24 ENCOUNTER — Other Ambulatory Visit: Payer: Self-pay

## 2020-11-24 ENCOUNTER — Ambulatory Visit (INDEPENDENT_AMBULATORY_CARE_PROVIDER_SITE_OTHER): Payer: Medicare Other | Admitting: Family Medicine

## 2020-11-24 VITALS — BP 146/82 | HR 75

## 2020-11-24 DIAGNOSIS — Z23 Encounter for immunization: Secondary | ICD-10-CM | POA: Diagnosis not present

## 2020-11-24 DIAGNOSIS — I10 Essential (primary) hypertension: Secondary | ICD-10-CM

## 2020-11-24 NOTE — Progress Notes (Signed)
   Covid-19 Vaccination Clinic  Name:  Roberta [name]    MRN: [id number] DOB: 06-18-1956  11/24/2020  Roberta [name] was observed post Covid-19 immunization for 30 minutes based on pre-vaccination screening without incident. She was provided with Vaccine Information Sheet and instruction to access the V-Safe system.   Roberta [name] was instructed to call 911 with any severe reactions post vaccine: Marland Kitchen Difficulty breathing  . Swelling of face and throat  . A fast heartbeat  . A bad rash all over body  . Dizziness and weakness   #1 Covid Vaccine administered RD without complication.  # 2 Covid Vaccine due 12/15/2020.   Patients BP today 146/82.  Patient reports compliance to her Lisinopril. Last took Lisinopril 5mg  this morning.

## 2020-12-03 DIAGNOSIS — M542 Cervicalgia: Secondary | ICD-10-CM | POA: Diagnosis not present

## 2020-12-15 ENCOUNTER — Other Ambulatory Visit: Payer: Self-pay

## 2020-12-15 ENCOUNTER — Ambulatory Visit (INDEPENDENT_AMBULATORY_CARE_PROVIDER_SITE_OTHER): Payer: Medicare Other

## 2020-12-15 DIAGNOSIS — Z23 Encounter for immunization: Secondary | ICD-10-CM

## 2020-12-15 NOTE — Progress Notes (Signed)
   Covid-19 Vaccination Clinic  Name:  Roberta [name]    MRN: [id number] DOB: 05/23/1956  12/15/2020  [name] was observed post Covid-19 immunization for 30 minutes based on pre-vaccination screening without incident. She was provided with Vaccine Information Sheet and instruction to access the V-Safe system.   [name] was instructed to call 911 with any severe reactions post vaccine: Marland Kitchen Difficulty breathing  . Swelling of face and throat  . A fast heartbeat  . A bad rash all over body  . Dizziness and weakness   #2 Covid Vaccine administered LD without complication.

## 2021-01-09 ENCOUNTER — Ambulatory Visit (HOSPITAL_COMMUNITY)
Admission: EM | Admit: 2021-01-09 | Discharge: 2021-01-09 | Disposition: A | Discharge status: Home / Self Care | Payer: Medicare Other | Attending: Emergency Medicine | Admitting: Emergency Medicine

## 2021-01-09 ENCOUNTER — Other Ambulatory Visit: Payer: Self-pay

## 2021-01-09 ENCOUNTER — Encounter (HOSPITAL_COMMUNITY): Payer: Self-pay

## 2021-01-09 ENCOUNTER — Ambulatory Visit (INDEPENDENT_AMBULATORY_CARE_PROVIDER_SITE_OTHER): Payer: Medicare Other

## 2021-01-09 DIAGNOSIS — M19041 Primary osteoarthritis, right hand: Secondary | ICD-10-CM | POA: Diagnosis not present

## 2021-01-09 DIAGNOSIS — M79644 Pain in right finger(s): Secondary | ICD-10-CM

## 2021-01-09 DIAGNOSIS — S61214A Laceration without foreign body of right ring finger without damage to nail, initial encounter: Secondary | ICD-10-CM | POA: Diagnosis not present

## 2021-01-09 DIAGNOSIS — R600 Localized edema: Secondary | ICD-10-CM | POA: Diagnosis not present

## 2021-01-09 DIAGNOSIS — M7989 Other specified soft tissue disorders: Secondary | ICD-10-CM | POA: Diagnosis not present

## 2021-01-09 DIAGNOSIS — S6991XA Unspecified injury of right wrist, hand and finger(s), initial encounter: Secondary | ICD-10-CM | POA: Diagnosis not present

## 2021-01-09 DIAGNOSIS — W230XXA Caught, crushed, jammed, or pinched between moving objects, initial encounter: Secondary | ICD-10-CM | POA: Diagnosis not present

## 2021-01-09 NOTE — Discharge Instructions (Signed)
Call Dr. Shelbie Ammons office on Monday for follow up next week.  Ice, elevation, tylenol or ibuprofen as needed for pain.  Return in 7-10 days for suture removal unless otherwise directed by orthopedics.

## 2021-01-09 NOTE — ED Triage Notes (Signed)
Pt states she slammed her right hand in the sliding door of her van approx 45 PTA. Reports pain to lateral aspect of hand, 5th finger  Pt reports her last tetanus immunization approx 5 years ago. Pt reports numbness to entire 5th finger, has difficulty spreading & curling, adducting 5th finger. Approx 2cm length tear/laceration to anteromedial aspect of proximal 5th finger, and 1.5 cm approx laceration to dorsal area of 5th finger.  Notified Rondel Oh, NP of pt status and she arrived for BS eval. Orders for hand xray received.

## 2021-01-09 NOTE — ED Notes (Signed)
Ortho tech called for ulnar gutter placement

## 2021-01-09 NOTE — ED Provider Notes (Signed)
[facility]    CSN: [id number] Arrival date & time: 01/09/21  1804      History   Chief Complaint Chief Complaint  Patient presents with  . Hand Injury    HPI Roberta [name] is a 65 y.o. female.   Roberta [name] presents with complaints of a right hand pinky finger laceration- slammed in her [employer] door today PTA. Numbness to the finger, particularly surrounding the laceration.  Limited ROM. She is unable to adduct the pinky finger, and is unable to flex at the MCP joint.  Scant bleeding. tdap within past 5 years; no previous injury to the finger.     ROS per HPI, negative if not otherwise mentioned.      Past Medical History:  Diagnosis Date  . Abnormal EKG    nonspecific ST and T-wave changes - Followed by Dr.Berry  . ALLERGIC RHINITIS 10/14/2010  . Anemia   . Asthma   . ASTHMA, INTERMITTENT 02/16/2007  . BACK PAIN, CHRONIC 03/25/2009  . CERVICAL RADICULOPATHY 10/16/2009  . DDD (degenerative disc disease), lumbosacral   . Depression   . DEPRESSIVE DISORDER NOT ELSEWHERE CLASSIFIED 03/25/2009  . Endometrial cancer ([place])   . GERD (gastroesophageal reflux disease) 09/09/2011  . Glaucoma    sees optho every 3 months, drops each night  . Hyperlipidemia   . Hypertension   . PONV (postoperative nausea and vomiting)   . Postmenopausal vaginal bleeding 09/24/2014    Patient Active Problem List   Diagnosis Date Noted  . Localized swelling of right lower extremity 03/11/2020  . Edema of right lower extremity 01/04/2020  . Stress 01/04/2020  . History of anaphylaxis 09/12/2019  . Pre-diabetes 05/23/2019  . Localized swelling, mass or lump of neck 10/26/2018  . Neuropathy 04/18/2018  . Pineal gland cyst 10/01/2015  . Exophthalmos 10/01/2015  . Endometrial cancer ([place]) 08/11/2015  . Atypical endometrial hyperplasia 07/28/2015  . Benign paroxysmal positional vertigo 09/24/2014  . Healthcare maintenance 04/24/2014  . Uterine fibroid 01/04/2014  . Cervical  disc disorder with radiculopathy of cervical region 10/24/2013  . Glaucoma   . GERD (gastroesophageal reflux disease) 09/09/2011  . Gastric polyp 09/09/2011  . ALLERGIC RHINITIS 10/14/2010  . BREAST MASS, BENIGN 02/04/2010  . BACK PAIN, CHRONIC 03/25/2009  . HYPERLIPIDEMIA 02/16/2007  . OBESITY, NOS 02/16/2007  . HYPERTENSION, BENIGN SYSTEMIC 02/16/2007  . Asthma 02/16/2007    Past Surgical History:  Procedure Laterality Date  . ANKLE SURGERY    . BIOPSY BREAST     LEFT  . BREAST CYST INCISION AND DRAINAGE     under breast  . BUNIONECTOMY     bilateral and toe correction  . CARPAL TUNNEL RELEASE Bilateral 2009  . CATARACT SURGERY    . CERVICAL SPINE SURGERY    . CHOLECYSTECTOMY  1980  . DILATATION & CURRETTAGE/HYSTEROSCOPY WITH RESECTOCOPE N/A 07/28/2015   Procedure: DILATATION & CURETTAGE/HYSTEROSCOPY WITH RESECTOCOPE;  Surgeon: Eldred Manges, MD;  Location: [facility];  Service: Gynecology;  Laterality: N/A;  . KNEE ARTHROSCOPY    . ROBOTIC ASSISTED TOTAL HYSTERECTOMY WITH BILATERAL SALPINGO OOPHERECTOMY Bilateral 09/04/2015   Procedure: ROBOTIC ASSISTED HYSTERECTOMY WITH BILATERAL SALPINGO OOPHORECTOMY SENTINAL NODE MAPPING ;  Surgeon: Everitt Amber, MD;  Location: [facility];  Service: Gynecology;  Laterality: Bilateral;  . ROTATOR CUFF REPAIR Right May 2014    OB History    Gravida  4   Para  3   Term  3   Preterm      AB  Living  4     SAB      IAB      Ectopic      Multiple  0   Live Births  4            Home Medications    Prior to Admission medications   Medication Sig Start Date End Date Taking? Authorizing Provider  Aspirin (VAZALORE PO) Take 81 mg by mouth daily in the afternoon.   Yes [provider]  esomeprazole (NEXIUM) 20 MG capsule TAKE 1 CAPSULE(20 MG) BY MOUTH DAILY AS NEEDED FOR ACID REFLUX 10/10/20  Yes Eniola, Kehinde T, MD  lisinopril (ZESTRIL) 5 MG tablet TAKE 1 TABLET BY MOUTH DAILY 11/22/20  Yes Autry-Lott, Naaman Plummer, DO   albuterol (PROAIR HFA) 108 (90 Base) MCG/ACT inhaler Inhale 2 puffs into the lungs every 4 (four) hours as needed. 08/08/20   Autry-Lott, Naaman Plummer, DO  albuterol (PROVENTIL) (2.5 MG/3ML) 0.083% nebulizer solution Take 3 mLs (2.5 mg total) by nebulization every 6 (six) hours as needed for wheezing or shortness of breath. 01/28/17   Eloise Levels, MD  Ascorbic Acid (VITAMIN C PO) Take by mouth.    [provider]  cyclobenzaprine (FLEXERIL) 10 MG tablet Take 10 mg daily as needed by mouth for muscle spasms.    [provider]  dorzolamide-timolol (COSOPT) 22.3-6.8 MG/ML ophthalmic solution Place 1 drop into both eyes 2 (two) times daily. 10/05/17   Eloise Levels, MD  meclizine (ANTIVERT) 12.5 MG tablet Take 1 tablet (12.5 mg total) by mouth 3 (three) times daily as needed. 11/22/20   Autry-Lott, Naaman Plummer, DO  Multiple Vitamins-Minerals (HAIR SKIN AND NAILS FORMULA PO) Take 1 tablet 3 (three) times a week by mouth.     [provider]  ondansetron (ZOFRAN) 4 MG tablet TAKE 1 TABLET BY MOUTH EVERY 8 HOURS AS NEEDED FOR NAUSEA AND VOMITING 11/22/20   Autry-Lott, Naaman Plummer, DO  traMADol (ULTRAM) 50 MG tablet Take 50 mg daily as needed by mouth for moderate pain.    [provider]  Travoprost, BAK Free, (TRAVATAN) 0.004 % SOLN ophthalmic solution Place 1 drop into both eyes at bedtime. 10/23/14   Bernadene Bell, MD    Family History Family History  Problem Relation Age of Onset  . Heart disease Mother        MI in 22s  . Lymphoma Mother        related to asbestos  . Cancer Mother        lymphoma (asbestos exposure)  . Alcoholism Father   . Cirrhosis Father        due to alcohol  . Asthma Father   . Cancer Maternal Uncle        lung  . Cancer Maternal Uncle        lung    Social History Social History   Tobacco Use  . Smoking status: Never Smoker  . Smokeless tobacco: Never Used  Vaping Use  . Vaping Use: Never used  Substance Use Topics  . Alcohol  use: No    Alcohol/week: 0.0 standard drinks  . Drug use: No     Allergies   Influenza vaccines, Budesonide, Flonase [fluticasone propionate], Codeine, Morphine and related, and Nickel   Review of Systems Review of Systems   Physical Exam Triage Vital Signs ED Triage Vitals  Enc Vitals Group     BP 01/09/21 1826 (!) 171/109     Pulse Rate 01/09/21 1826 75  Resp 01/09/21 1826 20     Temp 01/09/21 1826 98.6 F (37 C)     Temp Source 01/09/21 1826 Oral     SpO2 01/09/21 1826 95 %     Weight --      Height --      Head Circumference --      Peak Flow --      Pain Score 01/09/21 1823 8     Pain Loc --      Pain Edu? --      Excl. in [place]? --    No data found.  Updated Vital Signs BP (!) 171/109 (BP Location: Left Arm)   Pulse 75   Temp 98.6 F (37 C) (Oral)   Resp 20   SpO2 95%   Visual Acuity Right Eye Distance:   Left Eye Distance:   Bilateral Distance:    Right Eye Near:   Left Eye Near:    Bilateral Near:     Physical Exam Constitutional:      General: She is not in acute distress.    Appearance: She is well-developed.  Cardiovascular:     Rate and Rhythm: Normal rate.  Pulmonary:     Effort: Pulmonary effort is normal.  Musculoskeletal:     Right hand: Laceration present. Decreased range of motion.     Comments: 1.5 cm laceration to the medial aspect of right hand pinky finger proximal phalanx; approximately 4-5 mm in depth? Dorsum of finger also with approxiamtely .75cm laceration over proximal phalanx with only approximately 2 mm in depth; minimal ROM to DIPJ and to PIP joint, finger prefers extension; unable to flex at MCP joint; finger is unable to adduct; with passive adduction it automatically spreads away from ring finger; cap refill < 2 seconds ; no active bleeding   Skin:    General: Skin is warm and dry.  Neurological:     Mental Status: She is alert and oriented to person, place, and time.      UC Treatments / Results  Labs (all  labs ordered are listed, but only abnormal results are displayed) Labs Reviewed - No data to display  EKG   Radiology DG Finger Little Right  Result Date: 01/09/2021 CLINICAL DATA:  Slammed fifth digit in car door, pain EXAM: RIGHT LITTLE FINGER 2+V COMPARISON:  None. FINDINGS: Frontal, oblique, lateral views of the right fifth digit are obtained. There is diffuse soft tissue edema. No acute fracture, subluxation, or dislocation. Moderate osteoarthritis of the fifth distal interphalangeal joint. IMPRESSION: 1. Soft tissue swelling.  No acute fracture. 2. Osteoarthritis. Electronically Signed   By: Randa Ngo M.D.   On: 01/09/2021 19:00    Procedures Laceration Repair  Date/Time: 01/11/2021 10:12 AM Performed by: Zigmund Gottron, NP Authorized by: Zigmund Gottron, NP   Consent:    Consent obtained:  Verbal   Consent given by:  Patient   Risks, benefits, and alternatives were discussed: yes     Risks discussed:  Infection, need for additional repair, poor cosmetic result, tendon damage, pain and nerve damage   Alternatives discussed:  Referral and no treatment Universal protocol:    Patient identity confirmed:  Verbally with patient Anesthesia:    Anesthesia method:  None Laceration details:    Location:  Finger   Finger location:  R small finger   Length (cm):  1.5 (dorsal laceration 0.75 cm and 9mm deep)   Depth (mm):  5 Pre-procedure details:    Preparation:  Patient  was prepped and draped in usual sterile fashion Exploration:    Hemostasis achieved with:  Direct pressure   Imaging obtained: x-ray     Imaging outcome: foreign body not noted     Wound exploration: wound explored through full range of motion     Wound extent: no foreign bodies/material noted and no underlying fracture noted     Wound extent comment:  Concern for tendon damage although unable to visualize this  Treatment:    Area cleansed with:  Povidone-iodine and Shur-Clens   Amount of cleaning:   Extensive Skin repair:    Repair method:  Sutures   Suture size:  5-0   Suture material:  Nylon   Suture technique:  Simple interrupted   Number of sutures:  2 (1 placed to dorsal wound ) Approximation:    Approximation:  Close Repair type:    Repair type:  Simple Post-procedure details:    Dressing:  Non-adherent dressing and splint for protection   Procedure completion:  Tolerated   (including critical care time)  Medications Ordered in UC Medications - No data to display  Initial Impression / Assessment and Plan / UC Course  I have reviewed the triage vital signs and the nursing notes.  Pertinent labs & imaging results that were available during my care of the patient were reviewed by me and considered in my medical decision making (see chart for details).     Xray without acute findings. Call to on-call hand surgeon dr. Jeannie Fend to discuss physical exam findings- ok to close wounds, place ulnar gutter, and he will see her in office next week. Patient verbalized understanding and agreeable to plan.  Return precautions provided.  Final Clinical Impressions(s) / UC Diagnoses   Final diagnoses:  Laceration of right ring finger without foreign body without damage to nail, initial encounter     Discharge Instructions     Call Dr. Shelbie Ammons office on Monday for follow up next week.  Ice, elevation, tylenol or ibuprofen as needed for pain.  Return in 7-10 days for suture removal unless otherwise directed by orthopedics.     ED Prescriptions    None     PDMP not reviewed this encounter.   Zigmund Gottron, NP 01/11/21 1015

## 2021-01-09 NOTE — Progress Notes (Signed)
Orthopedic Tech Progress Note Patient Details:  Roberta [name] 1956-08-26 [id number]  Ortho Devices Type of Ortho Device: Ulna gutter splint Ortho Device/Splint Location: Right Hand Ortho Device/Splint Interventions: Application   Post Interventions Patient Tolerated: Well Instructions Provided: Care of device   Kenyona Rena E Tila Millirons 01/09/2021, 8:58 PM

## 2021-01-11 DIAGNOSIS — S61214A Laceration without foreign body of right ring finger without damage to nail, initial encounter: Secondary | ICD-10-CM | POA: Diagnosis not present

## 2021-01-13 ENCOUNTER — Encounter: Payer: Self-pay | Admitting: Family Medicine

## 2021-01-13 DIAGNOSIS — S61216A Laceration without foreign body of right little finger without damage to nail, initial encounter: Secondary | ICD-10-CM | POA: Diagnosis not present

## 2021-01-20 DIAGNOSIS — S61216D Laceration without foreign body of right little finger without damage to nail, subsequent encounter: Secondary | ICD-10-CM | POA: Diagnosis not present

## 2021-01-20 DIAGNOSIS — S6440XA Injury of digital nerve of unspecified finger, initial encounter: Secondary | ICD-10-CM | POA: Diagnosis not present

## 2021-01-23 DIAGNOSIS — G8918 Other acute postprocedural pain: Secondary | ICD-10-CM | POA: Diagnosis not present

## 2021-01-23 DIAGNOSIS — S61216A Laceration without foreign body of right little finger without damage to nail, initial encounter: Secondary | ICD-10-CM | POA: Diagnosis not present

## 2021-01-23 DIAGNOSIS — S64496A Injury of digital nerve of right little finger, initial encounter: Secondary | ICD-10-CM | POA: Diagnosis not present

## 2021-01-25 ENCOUNTER — Encounter: Payer: Self-pay | Admitting: Family Medicine

## 2021-01-25 DIAGNOSIS — I10 Essential (primary) hypertension: Secondary | ICD-10-CM

## 2021-01-26 ENCOUNTER — Other Ambulatory Visit: Payer: Self-pay

## 2021-01-26 DIAGNOSIS — I10 Essential (primary) hypertension: Secondary | ICD-10-CM

## 2021-01-26 MED ORDER — LISINOPRIL 10 MG PO TABS: 10.0000 mg | ORAL_TABLET | Freq: Every day | ORAL | 3 refills | Status: DC

## 2021-01-26 MED ORDER — LISINOPRIL 5 MG PO TABS: ORAL_TABLET | ORAL | 3 refills | Status: DC

## 2021-01-26 NOTE — Addendum Note (Signed)
Addended by: Dorna Bloom on: 01/26/2021 12:13 PM   Modules accepted: Orders

## 2021-01-26 NOTE — Telephone Encounter (Signed)
Patient calls nurse line stating her Lisinopril was sent in as one per day, patient reports she takes one 5mg  tab twice daily. Please resend in to her pharmacy if this is correct. Please advise.

## 2021-01-27 ENCOUNTER — Ambulatory Visit (INDEPENDENT_AMBULATORY_CARE_PROVIDER_SITE_OTHER): Payer: Medicare Other | Admitting: Family Medicine

## 2021-01-27 ENCOUNTER — Other Ambulatory Visit: Payer: Self-pay

## 2021-01-27 ENCOUNTER — Encounter: Payer: Self-pay | Admitting: Family Medicine

## 2021-01-27 VITALS — BP 150/100 | HR 66 | Ht 66.0 in | Wt 255.0 lb

## 2021-01-27 DIAGNOSIS — I10 Essential (primary) hypertension: Secondary | ICD-10-CM

## 2021-01-27 DIAGNOSIS — Z6841 Body Mass Index (BMI) 40.0 and over, adult: Secondary | ICD-10-CM | POA: Diagnosis not present

## 2021-01-27 NOTE — Patient Instructions (Addendum)
It was nice seeing you today!  Please start taking the 10 mg daily and no additional 5 mg dose.  I'd like to see how you do with consistent dosing with the 10 mg.  Please continue recording your blood pressure readings at home.  Please follow-up in 1 week to see how your blood pressure is doing.  Please bring in your blood pressure cuff to next visit.  We can compare with our blood pressure cuff and calibrate it if necessary.  Stay well, Zola Button, MD [place] [phone]    Managing Your Hypertension Hypertension, also called high blood pressure, is when the force of the blood pressing against the walls of the arteries is too strong. Arteries are blood vessels that carry blood from your heart throughout your body. Hypertension forces the heart to work harder to pump blood and may cause the arteries to become narrow or stiff. Understanding blood pressure readings Your personal target blood pressure may vary depending on your medical conditions, your age, and other factors. A blood pressure reading includes a higher number over a lower number. Ideally, your blood pressure should be below 120/80. You should know that:  The first, or top, number is called the systolic pressure. It is a measure of the pressure in your arteries as your heart beats.  The second, or bottom number, is called the diastolic pressure. It is a measure of the pressure in your arteries as the heart relaxes. Blood pressure is classified into four stages. Based on your blood pressure reading, your health care provider may use the following stages to determine what type of treatment you need, if any. Systolic pressure and diastolic pressure are measured in a unit called mmHg. Normal  Systolic pressure: below 947.  Diastolic pressure: below 80. Elevated  Systolic pressure: 654-650.  Diastolic pressure: below 80. Hypertension stage 1  Systolic pressure: 354-656.  Diastolic pressure:  81-27. Hypertension stage 2  Systolic pressure: 517 or above.  Diastolic pressure: 90 or above. How can this condition affect me? Managing your hypertension is an important responsibility. Over time, hypertension can damage the arteries and decrease blood flow to important parts of the body, including the brain, heart, and kidneys. Having untreated or uncontrolled hypertension can lead to:  A heart attack.  A stroke.  A weakened blood vessel (aneurysm).  Heart failure.  Kidney damage.  Eye damage.  Metabolic syndrome.  Memory and concentration problems.  Vascular dementia. What actions can I take to manage this condition? Hypertension can be managed by making lifestyle changes and possibly by taking medicines. Your health care provider will help you make a plan to bring your blood pressure within a normal range. Nutrition  Eat a diet that is high in fiber and potassium, and low in salt (sodium), added sugar, and fat. An example eating plan is called the Dietary Approaches to Stop Hypertension (DASH) diet. To eat this way: ? Eat plenty of fresh fruits and vegetables. Try to fill one-half of your plate at each meal with fruits and vegetables. ? Eat whole grains, such as whole-wheat pasta, brown rice, or whole-grain bread. Fill about one-fourth of your plate with whole grains. ? Eat low-fat dairy products. ? Avoid fatty cuts of meat, processed or cured meats, and poultry with skin. Fill about one-fourth of your plate with lean proteins such as fish, chicken without skin, beans, eggs, and tofu. ? Avoid pre-made and processed foods. These tend to be higher in sodium, added sugar,  and fat.  Reduce your daily sodium intake. Most people with hypertension should eat less than 1,500 mg of sodium a day.   Lifestyle  Work with your health care provider to maintain a healthy body weight or to lose weight. Ask what an ideal weight is for you.  Get at least 30 minutes of exercise that  causes your heart to beat faster (aerobic exercise) most days of the week. Activities may include walking, swimming, or biking.  Include exercise to strengthen your muscles (resistance exercise), such as weight lifting, as part of your weekly exercise routine. Try to do these types of exercises for 30 minutes at least 3 days a week.  Do not use any products that contain nicotine or tobacco, such as cigarettes, e-cigarettes, and chewing tobacco. If you need help quitting, ask your health care provider.  Control any long-term (chronic) conditions you have, such as high cholesterol or diabetes.  Identify your sources of stress and find ways to manage stress. This may include meditation, deep breathing, or making time for fun activities.   Alcohol use  Do not drink alcohol if: ? Your health care provider tells you not to drink. ? You are pregnant, may be pregnant, or are planning to become pregnant.  If you drink alcohol: ? Limit how much you use to:  0-1 drink a day for women.  0-2 drinks a day for men. ? Be aware of how much alcohol is in your drink. In the [place] one drink equals one 12 oz bottle of beer (355 mL), one 5 oz glass of wine (148 mL), or one 1 oz glass of hard liquor (44 mL). Medicines Your health care provider may prescribe medicine if lifestyle changes are not enough to get your blood pressure under control and if:  Your systolic blood pressure is 130 or higher.  Your diastolic blood pressure is 80 or higher. Take medicines only as told by your health care provider. Follow the directions carefully. Blood pressure medicines must be taken as told by your health care provider. The medicine does not work as well when you skip doses. Skipping doses also puts you at risk for problems. Monitoring Before you monitor your blood pressure:  Do not smoke, drink caffeinated beverages, or exercise within 30 minutes before taking a measurement.  Use the bathroom and empty your bladder  (urinate).  Sit quietly for at least 5 minutes before taking measurements. Monitor your blood pressure at home as told by your health care provider. To do this:  Sit with your back straight and supported.  Place your feet flat on the floor. Do not cross your legs.  Support your arm on a flat surface, such as a table. Make sure your upper arm is at heart level.  Each time you measure, take two or three readings one minute apart and record the results. You may also need to have your blood pressure checked regularly by your health care provider.   General information  Talk with your health care provider about your diet, exercise habits, and other lifestyle factors that may be contributing to hypertension.  Review all the medicines you take with your health care provider because there may be side effects or interactions.  Keep all visits as told by your health care provider. Your health care provider can help you create and adjust your plan for managing your high blood pressure. Where to find more information  National Heart, Lung, and Blood Institute: https://wilson-eaton.com/  American Heart Association: www.heart.org  Contact a health care provider if:  You think you are having a reaction to medicines you have taken.  You have repeated (recurrent) headaches.  You feel dizzy.  You have swelling in your ankles.  You have trouble with your vision. Get help right away if:  You develop a severe headache or confusion.  You have unusual weakness or numbness, or you feel faint.  You have severe pain in your chest or abdomen.  You vomit repeatedly.  You have trouble breathing. These symptoms may represent a serious problem that is an emergency. Do not wait to see if the symptoms will go away. Get medical help right away. Call your local emergency services (911 in the [place] Do not drive yourself to the hospital. Summary  Hypertension is when the force of blood pumping through your  arteries is too strong. If this condition is not controlled, it may put you at risk for serious complications.  Your personal target blood pressure may vary depending on your medical conditions, your age, and other factors. For most people, a normal blood pressure is less than 120/80.  Hypertension is managed by lifestyle changes, medicines, or both.  Lifestyle changes to help manage hypertension include losing weight, eating a healthy, low-sodium diet, exercising more, stopping smoking, and limiting alcohol. This information is not intended to replace advice given to you by your health care provider. Make sure you discuss any questions you have with your health care provider. Document Revised: 01/11/2020 Document Reviewed: 11/06/2019 Elsevier Patient Education  2021 [employer].

## 2021-01-27 NOTE — Assessment & Plan Note (Signed)
Suboptimal control though lisinopril dosing has been sporadic 5 to 10 mg total per day.  We will have her take the lisinopril 10 mg daily in one dose. - lisinopril 10 mg daily - keep BP log, bring cuff to next visit - f/u 1 week

## 2021-01-27 NOTE — Progress Notes (Signed)
    SUBJECTIVE:   CHIEF COMPLAINT / HPI:   HTN Meds: lisinopril 10 mg daily Took lisinopril around 8am this morning. Was taking 5 mg BID previously but would have to space them out sometimes due to running low on medication. Took 10 mg total 3 days this past week. Had a stressful week, someone (sister's adopted family sister) passed away and niece and brother went to the hospital this past week. Also had hand surgery this past Friday (tendon repair). Checks BP at home with wrist cuff, readings 140s-150s/80s-90s. Having difficulty finding an arm cuff that fits her. Did have a reading of 116/64 once this past week so then took only 5 mg the following day. Did feel lightheaded with this. Feels head pressure when her pressures are elevated.  PERTINENT  PMH / PSH: HTN, HLD, obesity, GERD, asthma, allergic rhinitis  OBJECTIVE:   BP (!) 150/100   Pulse 66   Ht 5\' 6"  (1.676 m)   Wt 255 lb (115.7 kg)   SpO2 99%   BMI 41.16 kg/m   General: Obese elderly lady, NAD CV: RRR, no murmurs Pulm: CTAB, no wheezes or rales  ASSESSMENT/PLAN:   HYPERTENSION, BENIGN SYSTEMIC Suboptimal control though lisinopril dosing has been sporadic 5 to 10 mg total per day.  We will have her take the lisinopril 10 mg daily in one dose. - lisinopril 10 mg daily - keep BP log, bring cuff to next visit - f/u 1 week    Zola Button, MD [place]

## 2021-01-28 DIAGNOSIS — Z1231 Encounter for screening mammogram for malignant neoplasm of breast: Secondary | ICD-10-CM | POA: Diagnosis not present

## 2021-01-30 NOTE — Progress Notes (Signed)
    SUBJECTIVE:   CHIEF COMPLAINT / HPI:   HTN Meds: lisinopril 10 mg daily At last visit, BP 150/100 with elevated readings at home though was taking inconsistent dosing of her lisinopril (taking 5 mg some days, 10 mg other days).  Patient instructed to take the lisinopril 10 mg daily in one dose, keep a BP log, and to bring BP cuff to follow-up visit. Today, she states she did not have much sleep last night and also had a dental procedure yesterday. Also had two deaths in the family last week so has been having increased stress. Avoids salty and processed foods. Afraid to go to the gym because of the pandemic but has been walking. Has felt lightheaded like her BP was too low once this past week (BP 96/61 then) in the morning. Waited a few hours to take her lisinopril after that. Patient states she was on HCTZ previously but was taken off 3-4 years ago. Has been taking her BP at home, using wrist cuff. Patient asking about walking programs as she does not feel safe walking in her neighborhood due to [place].  Recommended Silver sneakers program, patient states she is waiting for her card in the mail.  BP log: 2/9 130/82 2/10 146/92 2/11 142/97 2/12 96/61 2/13 102/66 2/14 124/73 2/15 118/76  PERTINENT  PMH / PSH: HTN, HLD, obesity, GERD, asthma, allergic rhinitis, pre-diabetes  OBJECTIVE:   BP (!) 157/104   Pulse 81   Ht 5\' 6"  (1.676 m)   Wt 255 lb 6.4 oz (115.8 kg)   SpO2 97%   BMI 41.22 kg/m   General: Obese elderly lady, NAD CV: RRR, no murmurs Pulm: CTAB, no wheezes or rales  ASSESSMENT/PLAN:   HYPERTENSION, BENIGN SYSTEMIC Not well controlled on lisinopril 10 mg daily.  Did have one low BP reading this past week, though there was discrepancy in her home BP cuff and cuff here (150/90 on our cuff versus 140/95 on her home wrist cuff).  Will increase to lisinopril 20 mg and recommend patient take this at night.  Patient plans to get a new BP cuff. - increase lisinopril  to 20 mg daily - BMP - f/u 2 weeks - consider adding second agent if persistently elevated at f/u  Pre-diabetes Most recent HbA1c 6.4% about 2 years ago.  Patient has been working on diet and exercise. - repeat HbA1c today   HCM - PCV13 declined  Zola Button, MD [place]

## 2021-01-30 NOTE — Patient Instructions (Addendum)
It was nice seeing you today!  Please increase your lisinopril to 20 mg daily to take at night.  I will write you a new prescription for the taking increased dose.  You can also continue to take your 10 mg tablets until they run out, just take 2 pills at night.  As far as walking programs, I would call your insurance about the Silver sneakers program at the [facility].  They may have some walking programs.  I will update you with the results of your blood work.  Please schedule a follow-up appointment in 2 weeks.  Stay well, Roberta Button, MD [place] [phone]    Managing Your Hypertension Hypertension, also called high blood pressure, is when the force of the blood pressing against the walls of the arteries is too strong. Arteries are blood vessels that carry blood from your heart throughout your body. Hypertension forces the heart to work harder to pump blood and may cause the arteries to become narrow or stiff. Understanding blood pressure readings Your personal target blood pressure may vary depending on your medical conditions, your age, and other factors. A blood pressure reading includes a higher number over a lower number. Ideally, your blood pressure should be below 120/80. You should know that:  The first, or top, number is called the systolic pressure. It is a measure of the pressure in your arteries as your heart beats.  The second, or bottom number, is called the diastolic pressure. It is a measure of the pressure in your arteries as the heart relaxes. Blood pressure is classified into four stages. Based on your blood pressure reading, your health care provider may use the following stages to determine what type of treatment you need, if any. Systolic pressure and diastolic pressure are measured in a unit called mmHg. Normal  Systolic pressure: below 706.  Diastolic pressure: below 80. Elevated  Systolic pressure: 237-628.  Diastolic pressure:  below 80. Hypertension stage 1  Systolic pressure: 315-176.  Diastolic pressure: 16-07. Hypertension stage 2  Systolic pressure: 371 or above.  Diastolic pressure: 90 or above. How can this condition affect me? Managing your hypertension is an important responsibility. Over time, hypertension can damage the arteries and decrease blood flow to important parts of the body, including the brain, heart, and kidneys. Having untreated or uncontrolled hypertension can lead to:  A heart attack.  A stroke.  A weakened blood vessel (aneurysm).  Heart failure.  Kidney damage.  Eye damage.  Metabolic syndrome.  Memory and concentration problems.  Vascular dementia. What actions can I take to manage this condition? Hypertension can be managed by making lifestyle changes and possibly by taking medicines. Your health care provider will help you make a plan to bring your blood pressure within a normal range. Nutrition  Eat a diet that is high in fiber and potassium, and low in salt (sodium), added sugar, and fat. An example eating plan is called the Dietary Approaches to Stop Hypertension (DASH) diet. To eat this way: ? Eat plenty of fresh fruits and vegetables. Try to fill one-half of your plate at each meal with fruits and vegetables. ? Eat whole grains, such as whole-wheat pasta, brown rice, or whole-grain bread. Fill about one-fourth of your plate with whole grains. ? Eat low-fat dairy products. ? Avoid fatty cuts of meat, processed or cured meats, and poultry with skin. Fill about one-fourth of your plate with lean proteins such as fish, chicken without skin, beans, eggs, and  tofu. ? Avoid pre-made and processed foods. These tend to be higher in sodium, added sugar, and fat.  Reduce your daily sodium intake. Most people with hypertension should eat less than 1,500 mg of sodium a day.   Lifestyle  Work with your health care provider to maintain a healthy body weight or to lose weight.  Ask what an ideal weight is for you.  Get at least 30 minutes of exercise that causes your heart to beat faster (aerobic exercise) most days of the week. Activities may include walking, swimming, or biking.  Include exercise to strengthen your muscles (resistance exercise), such as weight lifting, as part of your weekly exercise routine. Try to do these types of exercises for 30 minutes at least 3 days a week.  Do not use any products that contain nicotine or tobacco, such as cigarettes, e-cigarettes, and chewing tobacco. If you need help quitting, ask your health care provider.  Control any long-term (chronic) conditions you have, such as high cholesterol or diabetes.  Identify your sources of stress and find ways to manage stress. This may include meditation, deep breathing, or making time for fun activities.   Alcohol use  Do not drink alcohol if: ? Your health care provider tells you not to drink. ? You are pregnant, may be pregnant, or are planning to become pregnant.  If you drink alcohol: ? Limit how much you use to:  0-1 drink a day for women.  0-2 drinks a day for men. ? Be aware of how much alcohol is in your drink. In the U.S., one drink equals one 12 oz bottle of beer (355 mL), one 5 oz glass of wine (148 mL), or one 1 oz glass of hard liquor (44 mL). Medicines Your health care provider may prescribe medicine if lifestyle changes are not enough to get your blood pressure under control and if:  Your systolic blood pressure is 130 or higher.  Your diastolic blood pressure is 80 or higher. Take medicines only as told by your health care provider. Follow the directions carefully. Blood pressure medicines must be taken as told by your health care provider. The medicine does not work as well when you skip doses. Skipping doses also puts you at risk for problems. Monitoring Before you monitor your blood pressure:  Do not smoke, drink caffeinated beverages, or exercise within  30 minutes before taking a measurement.  Use the bathroom and empty your bladder (urinate).  Sit quietly for at least 5 minutes before taking measurements. Monitor your blood pressure at home as told by your health care provider. To do this:  Sit with your back straight and supported.  Place your feet flat on the floor. Do not cross your legs.  Support your arm on a flat surface, such as a table. Make sure your upper arm is at heart level.  Each time you measure, take two or three readings one minute apart and record the results. You may also need to have your blood pressure checked regularly by your health care provider.   General information  Talk with your health care provider about your diet, exercise habits, and other lifestyle factors that may be contributing to hypertension.  Review all the medicines you take with your health care provider because there may be side effects or interactions.  Keep all visits as told by your health care provider. Your health care provider can help you create and adjust your plan for managing your high blood pressure. Where to  find more information  National Heart, Lung, and Blood Institute: https://wilson-eaton.com/  American Heart Association: www.heart.org Contact a health care provider if:  You think you are having a reaction to medicines you have taken.  You have repeated (recurrent) headaches.  You feel dizzy.  You have swelling in your ankles.  You have trouble with your vision. Get help right away if:  You develop a severe headache or confusion.  You have unusual weakness or numbness, or you feel faint.  You have severe pain in your chest or abdomen.  You vomit repeatedly.  You have trouble breathing. These symptoms may represent a serious problem that is an emergency. Do not wait to see if the symptoms will go away. Get medical help right away. Call your local emergency services (911 in the U.S.). Do not drive yourself to the  hospital. Summary  Hypertension is when the force of blood pumping through your arteries is too strong. If this condition is not controlled, it may put you at risk for serious complications.  Your personal target blood pressure may vary depending on your medical conditions, your age, and other factors. For most people, a normal blood pressure is less than 120/80.  Hypertension is managed by lifestyle changes, medicines, or both.  Lifestyle changes to help manage hypertension include losing weight, eating a healthy, low-sodium diet, exercising more, stopping smoking, and limiting alcohol. This information is not intended to replace advice given to you by your health care provider. Make sure you discuss any questions you have with your health care provider. Document Revised: 01/11/2020 Document Reviewed: 11/06/2019 Elsevier Patient Education  2021 [employer].

## 2021-02-04 ENCOUNTER — Other Ambulatory Visit: Payer: Self-pay

## 2021-02-04 ENCOUNTER — Ambulatory Visit (INDEPENDENT_AMBULATORY_CARE_PROVIDER_SITE_OTHER): Payer: Medicare Other | Admitting: Family Medicine

## 2021-02-04 ENCOUNTER — Encounter: Payer: Self-pay | Admitting: Family Medicine

## 2021-02-04 VITALS — BP 157/104 | HR 81 | Ht 66.0 in | Wt 255.4 lb

## 2021-02-04 DIAGNOSIS — Z6841 Body Mass Index (BMI) 40.0 and over, adult: Secondary | ICD-10-CM | POA: Diagnosis not present

## 2021-02-04 DIAGNOSIS — I10 Essential (primary) hypertension: Secondary | ICD-10-CM

## 2021-02-04 DIAGNOSIS — R7303 Prediabetes: Secondary | ICD-10-CM | POA: Diagnosis not present

## 2021-02-04 LAB — POCT GLYCOSYLATED HEMOGLOBIN (HGB A1C): Hemoglobin A1C: 6.3 % — AB (ref 4.0–5.6)

## 2021-02-04 NOTE — Assessment & Plan Note (Addendum)
Not well controlled on lisinopril 10 mg daily.  Did have one low BP reading this past week, though there was discrepancy in her home BP cuff and cuff here (150/90 on our cuff versus 140/95 on her home wrist cuff).  Will increase to lisinopril 20 mg and recommend patient take this at night.  Patient plans to get a new BP cuff. - increase lisinopril to 20 mg daily - BMP - f/u 2 weeks - consider adding second agent if persistently elevated at f/u

## 2021-02-04 NOTE — Assessment & Plan Note (Signed)
Most recent HbA1c 6.4% about 2 years ago.  Patient has been working on diet and exercise. - repeat HbA1c today

## 2021-02-05 LAB — BASIC METABOLIC PANEL
BUN/Creatinine Ratio: 15 (ref 12–28)
BUN: 15 mg/dL (ref 8–27)
CO2: 26 mmol/L (ref 20–29)
Calcium: 9.4 mg/dL (ref 8.7–10.3)
Chloride: 102 mmol/L (ref 96–106)
Creatinine, Ser: 1.01 mg/dL — ABNORMAL HIGH (ref 0.57–1.00)
GFR calc Af Amer: 67 mL/min/{1.73_m2} (ref >59)
GFR calc non Af Amer: 58 mL/min/{1.73_m2} — ABNORMAL LOW (ref >59)
Glucose: 98 mg/dL (ref 65–99)
Potassium: 4.5 mmol/L (ref 3.5–5.2)
Sodium: 142 mmol/L (ref 134–144)

## 2021-02-16 ENCOUNTER — Ambulatory Visit (INDEPENDENT_AMBULATORY_CARE_PROVIDER_SITE_OTHER): Payer: Medicare Other | Admitting: Family Medicine

## 2021-02-16 ENCOUNTER — Encounter: Payer: Self-pay | Admitting: Family Medicine

## 2021-02-16 ENCOUNTER — Other Ambulatory Visit: Payer: Self-pay

## 2021-02-16 VITALS — BP 144/90 | HR 87 | Ht 66.0 in | Wt 253.5 lb

## 2021-02-16 DIAGNOSIS — H9191 Unspecified hearing loss, right ear: Secondary | ICD-10-CM

## 2021-02-16 DIAGNOSIS — I10 Essential (primary) hypertension: Secondary | ICD-10-CM

## 2021-02-16 DIAGNOSIS — F5102 Adjustment insomnia: Secondary | ICD-10-CM

## 2021-02-16 MED ORDER — LISINOPRIL 20 MG PO TABS: 20.0000 mg | ORAL_TABLET | Freq: Every day | ORAL | 2 refills | Status: DC

## 2021-02-16 MED ORDER — HYDROCHLOROTHIAZIDE 12.5 MG PO TABS: 12.5000 mg | ORAL_TABLET | Freq: Every day | ORAL | 2 refills | Status: DC

## 2021-02-16 NOTE — Patient Instructions (Addendum)
It was nice seeing you today!  Start taking the HCTZ 12.5 mg every day and continue taking the lisinopril 20 mg at night.  Below are some resources for therapy and counseling. You can try over-the-counter melatonin for sleep.  I have referred you to audiology for a full hearing evaluation.  Please follow-up in 2 weeks.  We will repeat blood work then.  Please arrive at least 15 minutes prior to your scheduled appointments.  Stay well, Zola Button, MD [place] [phone]    Insomnia Insomnia is a sleep disorder that makes it difficult to fall asleep or stay asleep. Insomnia can cause fatigue, low energy, difficulty concentrating, mood swings, and poor performance at work or school. There are three different ways to classify insomnia:  Difficulty falling asleep.  Difficulty staying asleep.  Waking up too early in the morning. Any type of insomnia can be long-term (chronic) or short-term (acute). Both are common. Short-term insomnia usually lasts for three months or less. Chronic insomnia occurs at least three times a week for longer than three months. What are the causes? Insomnia may be caused by another condition, situation, or substance, such as:  Anxiety.  Certain medicines.  Gastroesophageal reflux disease (GERD) or other gastrointestinal conditions.  Asthma or other breathing conditions.  Restless legs syndrome, sleep apnea, or other sleep disorders.  Chronic pain.  Menopause.  Stroke.  Abuse of alcohol, tobacco, or illegal drugs.  Mental health conditions, such as depression.  Caffeine.  Neurological disorders, such as Alzheimer's disease.  An overactive thyroid (hyperthyroidism). Sometimes, the cause of insomnia may not be known. What increases the risk? Risk factors for insomnia include:  Gender. Women are affected more often than men.  Age. Insomnia is more common as you get older.  Stress.  Lack of  exercise.  Irregular work schedule or working night shifts.  Traveling between different time zones.  Certain medical and mental health conditions. What are the signs or symptoms? If you have insomnia, the main symptom is having trouble falling asleep or having trouble staying asleep. This may lead to other symptoms, such as:  Feeling fatigued or having low energy.  Feeling nervous about going to sleep.  Not feeling rested in the morning.  Having trouble concentrating.  Feeling irritable, anxious, or depressed. How is this diagnosed? This condition may be diagnosed based on:  Your symptoms and medical history. Your health care provider may ask about: ? Your sleep habits. ? Any medical conditions you have. ? Your mental health.  A physical exam. How is this treated? Treatment for insomnia depends on the cause. Treatment may focus on treating an underlying condition that is causing insomnia. Treatment may also include:  Medicines to help you sleep.  Counseling or therapy.  Lifestyle adjustments to help you sleep better. Follow these instructions at home: Eating and drinking  Limit or avoid alcohol, caffeinated beverages, and cigarettes, especially close to bedtime. These can disrupt your sleep.  Do not eat a large meal or eat spicy foods right before bedtime. This can lead to digestive discomfort that can make it hard for you to sleep.   Sleep habits  Keep a sleep diary to help you and your health care provider figure out what could be causing your insomnia. Write down: ? When you sleep. ? When you wake up during the night. ? How well you sleep. ? How rested you feel the next day. ? Any side effects of medicines you are taking. ?  What you eat and drink.  Make your bedroom a dark, comfortable place where it is easy to fall asleep. ? Put up shades or blackout curtains to block light from outside. ? Use a white noise machine to block noise. ? Keep the temperature  cool.  Limit screen use before bedtime. This includes: ? Watching TV. ? Using your smartphone, tablet, or computer.  Stick to a routine that includes going to bed and waking up at the same times every day and night. This can help you fall asleep faster. Consider making a quiet activity, such as reading, part of your nighttime routine.  Try to avoid taking naps during the day so that you sleep better at night.  Get out of bed if you are still awake after 15 minutes of trying to sleep. Keep the lights down, but try reading or doing a quiet activity. When you feel sleepy, go back to bed.   General instructions  Take over-the-counter and prescription medicines only as told by your health care provider.  Exercise regularly, as told by your health care provider. Avoid exercise starting several hours before bedtime.  Use relaxation techniques to manage stress. Ask your health care provider to suggest some techniques that may work well for you. These may include: ? Breathing exercises. ? Routines to release muscle tension. ? Visualizing peaceful scenes.  Make sure that you drive carefully. Avoid driving if you feel very sleepy.  Keep all follow-up visits as told by your health care provider. This is important. Contact a health care provider if:  You are tired throughout the day.  You have trouble in your daily routine due to sleepiness.  You continue to have sleep problems, or your sleep problems get worse. Get help right away if:  You have serious thoughts about hurting yourself or someone else. If you ever feel like you may hurt yourself or others, or have thoughts about taking your own life, get help right away. You can go to your nearest emergency department or call:  Your local emergency services (911 in the U.S.).  A suicide crisis helpline, such as the [place] at 215-613-7160. This is open 24 hours a day. Summary  Insomnia is a sleep disorder  that makes it difficult to fall asleep or stay asleep.  Insomnia can be long-term (chronic) or short-term (acute).  Treatment for insomnia depends on the cause. Treatment may focus on treating an underlying condition that is causing insomnia.  Keep a sleep diary to help you and your health care provider figure out what could be causing your insomnia. This information is not intended to replace advice given to you by your health care provider. Make sure you discuss any questions you have with your health care provider. Document Revised: 10/16/2020 Document Reviewed: 10/16/2020 Elsevier Patient Education  2021 [place] and Counseling Resources Most providers on this list will take Medicaid. Patients with commercial insurance or Medicare should contact their insurance company to get a list of in network providers.  [facility]:Psychiatry and Counseling [address] [facility] [place]. [address], [place] [address] [phone]  [facility] [facility]  [address], [place], [place] [address]      [place] [address]  [place], [place] [address] [place] [address] [place], [place] [address]       [facility] [address] [place], [place]  [facility]:  [place]. [place] [phone]  [facility]:  [address] [place] [phone]  [facility] (under & uninsured) [address] [place] [phone]    [email or web address]    [place] [address] . [place]    [phone]  [facility] [place]     Phone:  [phone]     [place]  [place] [address]      [place], [place]  ext [place]: [place], [place], [place]   [place] ([place] therapist) [email or web address]  [place] [address]   [place] [address]    [phone]    [facility]   [address],  [place], [place]   [place]  [place]  [facility]  [address] [place], [place]        [phone]  Open Access/Walk In Clinic under & uninsured  [facility]  [address] [place], [place] Crisis [phone]  [facility] [place],  ([place])   [place], [place]: [phone]) 8:30 - 12; 1 - 2:30  [facility] of the [place],  [place], [place]    [phone]):8:30 - 12; 2 - 3PM  [facility] [employer],  [address],  [place]; [phone]):   Mon - Fri 8 AM - 5 PM  [facility] [place]  MWF 12:30 to 3:00 or call to schedule an appointment  [phone]  Specific Provider options Psychology Today  [email or web address] 1. click on find a therapist  2. enter your zip code 3. left side and select or tailor a therapist for your specific need.   [facility] Provider Directory [email or web address]  (Medicaid)   Follow all drop down to find a provider  [place] [phone] or [email or web address] [address], [place], [place] Recovery support and educational   24- Hour Availability:  .  [place] [facility]  . [place], [place] Crisis [phone]  . [facility] the [employer] [phone]  [facility] Crisis Service  [phone]   . [place]  [phone] (after hours)  . Therapeutic  Alternative/Mobile Crisis   [phone]  . [place] National Suicide Hotline  217-404-6855 ([place])  . Call 911 or go to emergency room  . [employer]  [phone]);  [place] and [employer]   . [facility] ACCESS  [phone]); [place], [place], [place], [place], [place], [place], [place]

## 2021-02-16 NOTE — Progress Notes (Signed)
    SUBJECTIVE:   CHIEF COMPLAINT / HPI:   HTN At last visit 2/16, lisinopril was increased to 20 mg daily. Has been taking her medications consistently though has sometimes been splitting up her dose (10 mg in the morning, 10 mg a day) - she will complete her 10 mg tablets before starting with 20 mg tablets Got her [employer] card in the mail today via [place]. Has not gotten a new BP cuff yet, waiting for it to become in stock Denies headaches, feels her BP has not been too high Walking and going to the gym more  Insomnia Difficulty sleeping for the past 3 weeks Working on getting her sleep schedule on track Has trouble staying asleep and falling asleep Feels her mind is very active at night Two family members have died in the past few weeks and she has been dealing with increased stress - some family members have not been taking it well Niece also in hospice Has been drinking tea at night which has been helping Interested in therapy, preference for video visits  Hearing loss Ongoing for 1 year 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, [employer] Thinks it may be in her right ear but unsure Daughter has noticed that she has been turning up the TV volume higher Has episodic vertigo related to BPPV  PERTINENT  PMH / PSH: HTN, HLD, obesity, GERD, asthma, allergic rhinitis, pre-diabetes, BPPV  OBJECTIVE:   BP (!) 144/90   Pulse 87   Ht 5\' 6"  (1.676 m)   Wt 253 lb 8 oz (115 kg)   SpO2 97%   BMI 40.92 kg/m   General: Obese female, NAD HEENT: TMs clear bilaterally CV: RRR, no murmurs Pulm: CTAB, no wheezes or rales   Hearing Screening   125Hz  250Hz  500Hz  1000Hz  2000Hz  3000Hz  4000Hz  6000Hz  8000Hz   Right ear:  Fail Fail Fail Fail  Fail    Left ear:  Pass Pass Pass Fail  Fail      Wt Readings from Last 3 Encounters:  02/16/21 253 lb 8 oz (115 kg)  02/04/21 255 lb 6.4 oz (115.8 kg)  01/27/21 255 lb (115.7 kg)     ASSESSMENT/PLAN:    HYPERTENSION, BENIGN SYSTEMIC Improved, but still not at goal.  Discussed option of adding on a second agent, patient opted for HCTZ as she had been on this previously. - continue lisinopril 20 mg - start HCTZ 12.5 mg - BMP today - f/u 2 weeks, will repeat BMP then  Hearing loss of right ear Hearing loss over the past year in the setting of remote exposure to loud sounds.  Hearing screen failed at all frequencies on the right, failed at 2000 Hz on the left.  No evidence of cerumen impaction.  Consider Mnire's disease given episodic vertigo, though she has been diagnosed with BPPV. - ref audiology - may need ENT referral pending results   Insomnia Suspect most likely related to stress and anxiety from recent family stressors/deaths.  Patient interested in therapy. - therapy resources given - recommended OTC melatonin - sleep hygiene info given  Zola Button, MD [place]

## 2021-02-17 LAB — BASIC METABOLIC PANEL
BUN/Creatinine Ratio: 18 (ref 12–28)
BUN: 19 mg/dL (ref 8–27)
CO2: 22 mmol/L (ref 20–29)
Calcium: 9.5 mg/dL (ref 8.7–10.3)
Chloride: 105 mmol/L (ref 96–106)
Creatinine, Ser: 1.04 mg/dL — ABNORMAL HIGH (ref 0.57–1.00)
Glucose: 97 mg/dL (ref 65–99)
Potassium: 4.3 mmol/L (ref 3.5–5.2)
Sodium: 142 mmol/L (ref 134–144)
eGFR: 59 mL/min/{1.73_m2} — ABNORMAL LOW (ref >59)

## 2021-02-17 NOTE — Assessment & Plan Note (Signed)
Improved, but still not at goal.  Discussed option of adding on a second agent, patient opted for HCTZ as she had been on this previously. - continue lisinopril 20 mg - start HCTZ 12.5 mg - BMP today - f/u 2 weeks, will repeat BMP then

## 2021-02-17 NOTE — Assessment & Plan Note (Signed)
Hearing loss over the past year in the setting of remote exposure to loud sounds.  Hearing screen failed at all frequencies on the right, failed at 2000 Hz on the left.  No evidence of cerumen impaction.  Consider Mnire's disease given episodic vertigo, though she has been diagnosed with BPPV. - ref audiology - may need ENT referral pending results

## 2021-02-24 ENCOUNTER — Ambulatory Visit: Payer: Medicare Other | Attending: Family Medicine | Admitting: Audiology

## 2021-02-24 ENCOUNTER — Other Ambulatory Visit: Payer: Self-pay

## 2021-02-24 DIAGNOSIS — H903 Sensorineural hearing loss, bilateral: Secondary | ICD-10-CM | POA: Insufficient documentation

## 2021-02-24 DIAGNOSIS — H9313 Tinnitus, bilateral: Secondary | ICD-10-CM | POA: Insufficient documentation

## 2021-02-24 NOTE — Procedures (Signed)
  Outpatient Audiology and [facility] [place], [place]  [address] [phone]  AUDIOLOGICAL  EVALUATION  NAME: Roberta [name]     DOB:   Nov 10, 1956      MRN: [id number]                                                                                     DATE: 02/24/2021     REFERENT: Gerlene Fee, DO STATUS: Outpatient DIAGNOSIS: Sensorineural Hearing Loss, bilateral    History: [name] was seen for an audiological evaluation due to decreased hearing occurring for 1 year. She reports increased difficulty hearing the television and communicating with her family. Sawyer has a history of BPPV and dizziness. She has undergone treatment for the BPPV. She continues to experience episodes of dizziness which she describes as "lightheadedness." She reports intermittent tinnitus and denies otalgia and aural fullness.   Evaluation:   Otoscopy showed a clear view of the tympanic membranes, bilaterally  Tympanometry results were consistent with normal middle ear pressure and normal tympanic membrane mobility, bilaterally.   Audiometric testing was completed using Conventional Audiometry techniques with insert earphones and TDH headphones. Test results are consistent with normal hearing sensitivity sloping to a moderate sensorineural hearing loss, bilaterally. Sensorineural asymmetry noted at 1000-1500 Hz, worse in the right ear. Speech Recognition Thresholds were obtained at 25 dB HL in the right ear and at 20 dB HL in the left ear. Word Recognition Testing was completed at 70 dB HL and Latondra scored 88% in the right ear and 92% in the left ear.   Results:  Today's test results are consistent with normal hearing sensitivity sloping to a moderate sensorineural hearing loss. Sensorineural asymmetry noted at 1000-1500 Hz, worse in the right ear. [name] will have communication difficulty in many listening situations. She will benefit from the use of good communication  strategies and the use of amplification.  The test results were reviewed with [name]. She was given handouts on good communication strategies and hearing aid centers in the [place] area.   Recommendations: 1. Referral to an Ear, Nose, and Throat Physician due to asymmetry.  2. Communication Needs Assessment with an Audiologist to discuss amplification and assistive listening technology if motivated.     Bari Mantis Audiologist, Au.D., CCC-A 02/24/2021  2:19 PM  Cc: Gerlene Fee, DO

## 2021-03-02 ENCOUNTER — Other Ambulatory Visit: Payer: Self-pay | Admitting: Family Medicine

## 2021-03-02 DIAGNOSIS — M79661 Pain in right lower leg: Secondary | ICD-10-CM

## 2021-03-09 NOTE — Patient Instructions (Incomplete)
It was nice seeing you today!  Stop taking the aspirin.  Follow-up in 3 months for blood pressure check.  Please arrive at least 15 minutes prior to your scheduled appointments.  Stay well, Zola Button, MD [place] [phone]

## 2021-03-09 NOTE — Progress Notes (Signed)
    SUBJECTIVE:   CHIEF COMPLAINT / HPI:   HTN Meds: lisinopril 20 mg daily, HCTZ 12.5 mg daily At last visit, started on HCTZ as BP not at goal. Home BP readings mostly 110s/70s, still using a wrist one. Waiting to get a new cuff but waiting for it to come back in stock. Denies headache. Does not feel like her BP has been high. Recently started an exercise plan - doing line dancing classes and doing the elliptical at the [facility], working on building up to 4 days/week  Hearing loss At last visit, referred to audiology for hearing loss and had failed hearing screen. She had an audiology eval 3/8 which revealed sensorineural asymmetry noted at 1000-1500 Hz, worse in right ear. Recommended ENT referral. She will be fitted with hearing aids soon.   PERTINENT  PMH / PSH: HTN, HLD, obesity, GERD, asthma, allergic rhinitis, pre-diabetes, BPPV  OBJECTIVE:   BP 130/82   Pulse 72   Wt 251 lb 12.8 oz (114.2 kg)   SpO2 96%   BMI 40.64 kg/m   General: Obese female, NAD CV: RRR, no murmurs Pulm: CTAB, no wheezes or rales  Wt Readings from Last 3 Encounters:  03/10/21 251 lb 12.8 oz (114.2 kg)  02/16/21 253 lb 8 oz (115 kg)  02/04/21 255 lb 6.4 oz (115.8 kg)     ASSESSMENT/PLAN:   Hearing loss, sensorineural, asymmetrical Sensorineural asymmetry noted with formal audiology evaluation, receiving hearing aids and has been referred to ENT already. Instructed patient to call if any issues, can place referral if needed.  HYPERTENSION, BENIGN SYSTEMIC Well-controlled, initially elevated in office but repeat at goal. - BMP - f/u 3 months   ASA discontinued, no longer recommended for primary prevention. No history of MI or CVA.  Zola Button, MD [place]

## 2021-03-10 ENCOUNTER — Ambulatory Visit (INDEPENDENT_AMBULATORY_CARE_PROVIDER_SITE_OTHER): Payer: Medicare Other | Admitting: Family Medicine

## 2021-03-10 ENCOUNTER — Other Ambulatory Visit: Payer: Self-pay

## 2021-03-10 ENCOUNTER — Encounter: Payer: Self-pay | Admitting: Family Medicine

## 2021-03-10 VITALS — BP 130/82 | HR 72 | Wt 251.8 lb

## 2021-03-10 DIAGNOSIS — I10 Essential (primary) hypertension: Secondary | ICD-10-CM

## 2021-03-10 DIAGNOSIS — H903 Sensorineural hearing loss, bilateral: Secondary | ICD-10-CM

## 2021-03-10 NOTE — Assessment & Plan Note (Signed)
Well-controlled, initially elevated in office but repeat at goal. - BMP - f/u 3 months

## 2021-03-10 NOTE — Assessment & Plan Note (Addendum)
Sensorineural asymmetry noted with formal audiology evaluation, receiving hearing aids and has been referred to ENT already. Instructed patient to call if any issues, can place referral if needed.

## 2021-03-11 DIAGNOSIS — H905 Unspecified sensorineural hearing loss: Secondary | ICD-10-CM | POA: Diagnosis not present

## 2021-03-11 LAB — BASIC METABOLIC PANEL
BUN/Creatinine Ratio: 17 (ref 12–28)
BUN: 17 mg/dL (ref 8–27)
CO2: 23 mmol/L (ref 20–29)
Calcium: 9.4 mg/dL (ref 8.7–10.3)
Chloride: 103 mmol/L (ref 96–106)
Creatinine, Ser: 0.98 mg/dL (ref 0.57–1.00)
Glucose: 95 mg/dL (ref 65–99)
Potassium: 4.1 mmol/L (ref 3.5–5.2)
Sodium: 142 mmol/L (ref 134–144)
eGFR: 64 mL/min/{1.73_m2} (ref >59)

## 2021-03-19 DIAGNOSIS — H2513 Age-related nuclear cataract, bilateral: Secondary | ICD-10-CM | POA: Diagnosis not present

## 2021-03-30 ENCOUNTER — Encounter: Payer: Self-pay | Admitting: Family Medicine

## 2021-03-30 ENCOUNTER — Telehealth (INDEPENDENT_AMBULATORY_CARE_PROVIDER_SITE_OTHER): Payer: Medicare Other | Admitting: Family Medicine

## 2021-03-30 DIAGNOSIS — J309 Allergic rhinitis, unspecified: Secondary | ICD-10-CM | POA: Diagnosis not present

## 2021-03-30 MED ORDER — CETIRIZINE HCL 10 MG PO TABS: 10.0000 mg | ORAL_TABLET | Freq: Every day | ORAL | 1 refills | Status: DC

## 2021-03-30 MED ORDER — SALINE SPRAY 0.65 % NA SOLN: 1.0000 | NASAL | 1 refills | Status: DC | PRN

## 2021-03-30 NOTE — Progress Notes (Signed)
[facility] Telemedicine Visit  Patient consented to have virtual visit and was identified by name and date of birth. Method of visit: Telephone  Encounter participants: Patient: Roberta [name] - located at home Provider: Carollee Leitz - located at home office Others (if applicable): none  Chief Complaint: not feeling good  HPI:  Reports went to park Sat and noticed alot of pollen on car after to which she attributes her symptoms to.  Reports nasal congestion and pressure, cough with thick mucus, intermittent nausea without vomiting.  Also endorses feeling cold. Denies any fevers,worseing shortness of breath or wheezing.  Reports having had 3 asthma attacks on Sat that resolved with albuterol inhaler.  Did not seek medical attention at that time.  She reports that her oxygen sats are 99% and she has not used her inhaler since yesterday morning.  Appetite has slightly decreased and she is drinking lemon honey teas that helps with symptoms. She also endorses that she was recently in contact with her grandson who had similar symptoms, they both tested negative for COVID.   ROS: per HPI  Pertinent PMHx:  Seasonal allergies  Exam:  There were no vitals taken for this visit.  Respiratory: sounds congested, no SOB or IWOB, able to speak in full sentences  Assessment/Plan:  Allergic rhinitis Likely symptoms caused by allergies given onset after day in park. Less likely viral/bacterial given no fevers. Patient reports that she was never tested for asthma just told that she had allergies and to use inhaler when acts up.  Per chart review last Spirometry completed on 2016 showed normal lung function. -Recommend repeat PFTs when symptoms improve to confirm -Start Zyrtec 10mg  daily -Nasal spray as needed -Can try Neti Pot daily -Trial Humidifier at night -Albuterol q4-6x24 hrs, advised to make an appointment with PCP to discuss if needing controller and as I cannot exam  her lungs today she would benefit from in office visit -Strict return precautions provided     Time spent during visit with patient: 13 minutes

## 2021-03-30 NOTE — Assessment & Plan Note (Signed)
Likely symptoms caused by allergies given onset after day in park. Less likely viral/bacterial given no fevers. Patient reports that she was never tested for asthma just told that she had allergies and to use inhaler when acts up.  Per chart review last Spirometry completed on 2016 showed normal lung function. -Recommend repeat PFTs when symptoms improve to confirm -Start Zyrtec 10mg  daily -Nasal spray as needed -Can try Neti Pot daily -Trial Humidifier at night -Albuterol q4-6x24 hrs, advised to make an appointment with PCP to discuss if needing controller and as I cannot exam her lungs today she would benefit from in office visit -Strict return precautions provided

## 2021-04-01 ENCOUNTER — Ambulatory Visit (INDEPENDENT_AMBULATORY_CARE_PROVIDER_SITE_OTHER): Payer: Medicare Other | Admitting: Family Medicine

## 2021-04-01 ENCOUNTER — Encounter: Payer: Self-pay | Admitting: Family Medicine

## 2021-04-01 ENCOUNTER — Other Ambulatory Visit: Payer: Self-pay

## 2021-04-01 DIAGNOSIS — J45909 Unspecified asthma, uncomplicated: Secondary | ICD-10-CM | POA: Diagnosis not present

## 2021-04-01 MED ORDER — PREDNISONE 20 MG PO TABS: 40.0000 mg | ORAL_TABLET | Freq: Every day | ORAL | 0 refills | Status: DC

## 2021-04-01 NOTE — Patient Instructions (Signed)
You are having an asthma flare.  I sent in a prescription for prednisone.   Hopefully the nausea and the dizziness will calm down as the asthma flare calms. See [place] next week if you are not improving.

## 2021-04-02 ENCOUNTER — Encounter: Payer: Self-pay | Admitting: Family Medicine

## 2021-04-02 NOTE — Assessment & Plan Note (Signed)
Acute exacerbation.  Treat with oral steroids.  I feel she would benefit from a controler, but problems with 2 previous ICS

## 2021-04-02 NOTE — Progress Notes (Signed)
    SUBJECTIVE:   CHIEF COMPLAINT / HPI:   Know allergies and asthma.  Not on controler (inhaled steroids) because of previous adverse reaction with two different inhalers.  Tolerates PRN albuterol well.  Spring is difficult time for her.  She began feeling ill with cough and SOB after spending considerable time outside 5 days ago.  Not improving.  Cough and wheezing, especially at night.  Tolerates oral steroids OK. No fever.  Home COVID test negative.  Has 1-2 asthma flares per year. She also has nausea and dizziness.  These are chronic problems for her and treated with meclizine and zofran.  Both are worse with current illness.    OBJECTIVE:   BP 114/76   Pulse 84   Ht 5\' 6"  (1.676 m)   Wt 248 lb 6.4 oz (112.7 kg)   SpO2 97%   BMI 40.09 kg/m   Neck, no sig adenopathy Lungs prolonged exp phase and diffuse end exp wheeze.  No rales.  ASSESSMENT/PLAN:   Asthma Acute exacerbation.  Treat with oral steroids.  I feel she would benefit from a controler, but problems with 2 previous [place], MD [place]

## 2021-04-03 ENCOUNTER — Telehealth: Payer: Self-pay | Admitting: Family Medicine

## 2021-04-03 DIAGNOSIS — J45909 Unspecified asthma, uncomplicated: Secondary | ICD-10-CM

## 2021-04-03 MED ORDER — ALBUTEROL SULFATE (2.5 MG/3ML) 0.083% IN NEBU: 2.5000 mg | INHALATION_SOLUTION | Freq: Four times a day (QID) | RESPIRATORY_TRACT | 1 refills | Status: DC | PRN

## 2021-04-03 NOTE — Telephone Encounter (Signed)
Patient reports she has been having back to back asthma attacks since Saturday. She reports having a virtual visit and in-person visit. She has been trying to use nebulizer for albuterol inhaler and is requesting a refill as her current supply is marked as "expired". Patient reports that she begins to wheeze with lying down and had a hard time sleeping last night which required her to use her inhaler to get to sleep. Patient denies currently experiencing respiratory distress. She reports she has been taking her recently prescribed PO steroids.  -albuterol Nebulizer solution sent to patient's pharmacy  Eulis Foster, [place], PGY-2  [place] Intern Pager [phone]

## 2021-04-15 NOTE — Progress Notes (Signed)
    SUBJECTIVE:   CHIEF COMPLAINT / HPI: f/u asthma  Asthma Patient was most recently seen in clinic by Dr. Andria Frames on 4/13 for asthma exacerbation.  She was treated with oral steroids, prednisone 40 mg daily for 5 days. Not on ICS due to previous adverse reaction with 2 different inhalers. Today, patient reports symptoms are significantly improved after completing steroids. No longer wheezing, still having occasional cough. Reports allergy medications are helping with her symptoms (cough, sneezing, congestion). No longer using albuterol. Patient states she was outside a few weeks ago with her mask off, then allergies acted up causing her most recent asthma exacerbation.  PERTINENT  PMH / PSH: asthma, allergic rhinitis  OBJECTIVE:   BP (!) 122/92   Pulse 80   Ht 5\' 6"  (1.676 m)   Wt 254 lb (115.2 kg)   SpO2 97%   BMI 41.00 kg/m   General: Obese elderly female, NAD CV: RRR, no murmurs Pulm: CTAB, no wheezes or rales  ASSESSMENT/PLAN:   Asthma Recent asthma exacerbation secondary to allergic rhinitis now resolved s/p 5-day course of corticosteroid.  No longer using albuterol inhaler and allergy symptoms are controlled with cetirizine.  Very rarely has asthma exacerbations, consider adding controller or montelukast if having more frequent exacerbations.     Zola Button, MD [place]

## 2021-04-15 NOTE — Patient Instructions (Addendum)
It was nice seeing you today!  I am glad you are feeling better.  If you find yourself using the albuterol inhaler multiple times a week, please schedule a follow-up appointment to discuss how to prevent further asthma flareups.  Please arrive at least 15 minutes prior to your scheduled appointments.  Stay well, Roberta Button, MD [place] [phone]    [email or web address]  Asthma, Adult  Asthma is a long-term (chronic) condition that causes recurrent episodes in which the airways become tight and narrow. The airways are the passages that lead from the nose and mouth down into the lungs. Asthma episodes, also called asthma attacks, can cause coughing, wheezing, shortness of breath, and chest pain. The airways can also fill with mucus. During an attack, it can be difficult to breathe. Asthma attacks can range from minor to life threatening. Asthma cannot be cured, but medicines and lifestyle changes can help control it and treat acute attacks. What are the causes? This condition is believed to be caused by inherited (genetic) and environmental factors, but its exact cause is not known. There are many things that can bring on an asthma attack or make asthma symptoms worse (triggers). Asthma triggers are different for each person. Common triggers include:  Mold.  Dust.  Cigarette smoke.  Cockroaches.  Things that can cause allergy symptoms (allergens), such as animal dander or pollen from trees or grass.  Air pollutants such as household cleaners, wood smoke, smog, or Advertising account planner.  Cold air, weather changes, and winds (which increase molds and pollen in the air).  Strong emotional expressions such as crying or laughing hard.  Stress.  Certain medicines (such as aspirin) or types of medicines (such as beta-blockers).  Sulfites in foods and drinks. Foods and drinks that may contain sulfites include dried fruit,  potato chips, and sparkling grape juice.  Infections or inflammatory conditions such as the flu, a cold, or inflammation of the nasal membranes (rhinitis).  Gastroesophageal reflux disease (GERD).  Exercise or strenuous activity. What are the signs or symptoms? Symptoms of this condition may occur right after asthma is triggered or many hours later. Symptoms include:  Wheezing. This can sound like whistling when you breathe.  Excessive nighttime or early morning coughing.  Frequent or severe coughing with a common cold.  Chest tightness.  Shortness of breath.  Tiredness (fatigue) with minimal activity. How is this diagnosed? This condition is diagnosed based on:  Your medical history.  A physical exam.  Tests, which may include: ? Lung function studies and pulmonary studies (spirometry). These tests can evaluate the flow of air in your lungs. ? Allergy tests. ? Imaging tests, such as X-rays. How is this treated? There is no cure for this condition, but treatment can help control your symptoms. Treatment for asthma usually involves:  Identifying and avoiding your asthma triggers.  Using medicines to control your symptoms. Generally, two types of medicines are used to treat asthma: ? Controller medicines. These help prevent asthma symptoms from occurring. They are usually taken every day. ? Fast-acting reliever or rescue medicines. These quickly relieve asthma symptoms by widening the narrow and tight airways. They are used as needed and provide short-term relief.  Using supplemental oxygen. This may be needed during a severe episode.  Using other medicines, such as: ? Allergy medicines, such as antihistamines, if your asthma attacks are triggered by allergens. ? Immune medicines (immunomodulators). These are medicines that help control the immune system.  Creating an asthma action  plan. An asthma action plan is a written plan for managing and treating your asthma  attacks. This plan includes: ? A list of your asthma triggers and how to avoid them. ? Information about when medicines should be taken and when their dosage should be changed. ? Instructions about using a device called a peak flow meter. A peak flow meter measures how well the lungs are working and the severity of your asthma. It helps you monitor your condition. Follow these instructions at home: Controlling your home environment Control your home environment in the following ways to help avoid triggers and prevent asthma attacks:  Change your heating and air conditioning filter regularly.  Limit your use of fireplaces and wood stoves.  Get rid of pests (such as roaches and mice) and their droppings.  Throw away plants if you see mold on them.  Clean floors and dust surfaces regularly. Use unscented cleaning products.  Try to have someone else vacuum for you regularly. Stay out of rooms while they are being vacuumed and for a short while afterward. If you vacuum, use a dust mask from a hardware store, a double-layered or microfilter vacuum cleaner bag, or a vacuum cleaner with a HEPA filter.  Replace carpet with wood, tile, or vinyl flooring. Carpet can trap dander and dust.  Use allergy-proof pillows, mattress covers, and box spring covers.  Keep your bedroom a trigger-free room.  Avoid pets and keep windows closed when allergens are in the air.  Wash beddings every week in hot water and dry them in a dryer.  Use blankets that are made of polyester or cotton.  Clean bathrooms and kitchens with bleach. If possible, have someone repaint the walls in these rooms with mold-resistant paint. Stay out of the rooms that are being cleaned and painted.  Wash your hands often with soap and water. If soap and water are not available, use hand sanitizer.  Do not allow anyone to smoke in your home. General instructions  Take over-the-counter and prescription medicines only as told by your  health care provider. ? Speak with your health care provider if you have questions about how or when to take the medicines. ? Make note if you are requiring more frequent dosages.  Do not use any products that contain nicotine or tobacco, such as cigarettes and e-cigarettes. If you need help quitting, ask your health care provider. Also, avoid being exposed to secondhand smoke.  Use a peak flow meter as told by your health care provider. Record and keep track of the readings.  Understand and use the asthma action plan to help minimize, or stop an asthma attack, without needing to seek medical care.  Make sure you stay up to date on your yearly vaccinations as told by your health care provider. This may include vaccines for the flu and pneumonia.  Avoid outdoor activities when allergen counts are high and when air quality is low.  Wear a ski mask that covers your nose and mouth during outdoor winter activities. Exercise indoors on cold days if you can.  Warm up before exercising, and take time for a cool-down period after exercise.  Keep all follow-up visits as told by your health care provider. This is important. Where to find more information  For information about asthma, turn to the Centers for Disease Control and Prevention at http://www.clark.net/  For air quality information, turn to [employer] at https://www.miller-reyes.info/ Contact a health care provider if:  You have wheezing, shortness of breath, or a  cough even while you are taking medicine to prevent attacks.  The mucus you cough up (sputum) is thicker than usual.  Your sputum changes from clear or white to yellow, green, gray, or bloody.  Your medicines are causing side effects, such as a rash, itching, swelling, or trouble breathing.  You need to use a reliever medicine more than 2-3 times a week.  Your peak flow reading is still at 50-79% of your personal best after following your action plan for 1 hour.  You have a fever. Get help  right away if:  You are getting worse and do not respond to treatment during an asthma attack.  You are short of breath when at rest or when doing very little physical activity.  You have difficulty eating, drinking, or talking.  You have chest pain or tightness.  You develop a fast heartbeat or palpitations.  You have a bluish color to your lips or fingernails.  You are light-headed or dizzy, or you faint.  Your peak flow reading is less than 50% of your personal best.  You feel too tired to breathe normally. Summary  Asthma is a long-term (chronic) condition that causes recurrent episodes in which the airways become tight and narrow. These episodes can cause coughing, wheezing, shortness of breath, and chest pain.  Asthma cannot be cured, but medicines and lifestyle changes can help control it and treat acute attacks.  Make sure you understand how to avoid triggers and how and when to use your medicines.  Asthma attacks can range from minor to life threatening. Get help right away if you have an asthma attack and do not respond to treatment with your usual rescue medicines. This information is not intended to replace advice given to you by your health care provider. Make sure you discuss any questions you have with your health care provider. Document Revised: 09/05/2020 Document Reviewed: 04/09/2020 Elsevier Patient Education  2021 [employer].

## 2021-04-16 ENCOUNTER — Ambulatory Visit (INDEPENDENT_AMBULATORY_CARE_PROVIDER_SITE_OTHER): Payer: Medicare Other | Admitting: Family Medicine

## 2021-04-16 ENCOUNTER — Other Ambulatory Visit: Payer: Self-pay

## 2021-04-16 ENCOUNTER — Encounter: Payer: Self-pay | Admitting: Family Medicine

## 2021-04-16 VITALS — BP 122/92 | HR 80 | Ht 66.0 in | Wt 254.0 lb

## 2021-04-16 DIAGNOSIS — J301 Allergic rhinitis due to pollen: Secondary | ICD-10-CM

## 2021-04-16 DIAGNOSIS — J4520 Mild intermittent asthma, uncomplicated: Secondary | ICD-10-CM

## 2021-04-16 NOTE — Assessment & Plan Note (Signed)
Recent asthma exacerbation secondary to allergic rhinitis now resolved s/p 5-day course of corticosteroid.  No longer using albuterol inhaler and allergy symptoms are controlled with cetirizine.  Very rarely has asthma exacerbations, consider adding controller or montelukast if having more frequent exacerbations.

## 2021-05-07 DIAGNOSIS — M67912 Unspecified disorder of synovium and tendon, left shoulder: Secondary | ICD-10-CM | POA: Diagnosis not present

## 2021-05-07 DIAGNOSIS — M545 Low back pain, unspecified: Secondary | ICD-10-CM | POA: Diagnosis not present

## 2021-05-13 NOTE — Patient Instructions (Addendum)
It was nice seeing you today!  I am prescribing you lorazepam for the airplane ride. Take one tablet when you get seated on the plane. Do NOT drink any alcohol with this.  You can get your second Covid booster after 4 months.   Please arrive at least 15 minutes prior to your scheduled appointments.  Stay well, Zola Button, MD [place] [phone]

## 2021-05-13 NOTE — Progress Notes (Signed)
    SUBJECTIVE:   CHIEF COMPLAINT / HPI:   HTN Meds: lisinopril 20 mg daily, HCTZ 12.5 mg daily Got a new forearm cuff  Flying phobia Will be going to [place] soon to visit his brother and will be riding a plane for the first time. Requesting an anxiolytic such as Valium for the plane ride. Has taken Valium before years ago after having an anxiety attack surrounding marriage.  PERTINENT  PMH / PSH: HTN, asthma, allergic rhinitis  OBJECTIVE:   BP 122/75   Pulse 83   Ht 5\' 6"  (1.676 m)   Wt 250 lb 12.8 oz (113.8 kg)   SpO2 96%   BMI 40.48 kg/m   General: Obese elderly female, NAD CV: RRR, no murmurs Pulm: CTAB, no wheezes or rales  Wt Readings from Last 3 Encounters:  05/19/21 250 lb 12.8 oz (113.8 kg)  04/16/21 254 lb (115.2 kg)  04/01/21 248 lb 6.4 oz (112.7 kg)     ASSESSMENT/PLAN:   HYPERTENSION, BENIGN SYSTEMIC Well controlled on current regimen   Flying phobia Low dose lorazepam x 2 doses rx'ed for each plane ride. Counseled to not drink alcohol with this.  HCM - Covid booster given today - Shingrix declined, willing to get it later (does not want to combine vaccines) - can call to order later if desired - PNA vaccine declined, willing to get it later - can schedule RN visit for this - DEXA scan done at her orthopedist office, reportedly normal  Zola Button, MD [place]

## 2021-05-19 ENCOUNTER — Other Ambulatory Visit: Payer: Self-pay

## 2021-05-19 ENCOUNTER — Encounter: Payer: Self-pay | Admitting: Family Medicine

## 2021-05-19 ENCOUNTER — Ambulatory Visit (INDEPENDENT_AMBULATORY_CARE_PROVIDER_SITE_OTHER): Payer: Medicare Other

## 2021-05-19 ENCOUNTER — Ambulatory Visit (INDEPENDENT_AMBULATORY_CARE_PROVIDER_SITE_OTHER): Payer: Medicare Other | Admitting: Family Medicine

## 2021-05-19 VITALS — BP 122/75 | HR 83 | Ht 66.0 in | Wt 250.8 lb

## 2021-05-19 DIAGNOSIS — Z23 Encounter for immunization: Secondary | ICD-10-CM | POA: Diagnosis not present

## 2021-05-19 DIAGNOSIS — F40243 Fear of flying: Secondary | ICD-10-CM

## 2021-05-19 DIAGNOSIS — I10 Essential (primary) hypertension: Secondary | ICD-10-CM | POA: Diagnosis not present

## 2021-05-19 MED ORDER — LORAZEPAM 0.5 MG PO TABS: 0.5000 mg | ORAL_TABLET | Freq: Every day | ORAL | 0 refills | Status: DC | PRN

## 2021-05-19 NOTE — Assessment & Plan Note (Signed)
Well-controlled on current regimen. ?

## 2021-06-11 DIAGNOSIS — M545 Low back pain, unspecified: Secondary | ICD-10-CM | POA: Diagnosis not present

## 2021-07-03 ENCOUNTER — Encounter: Payer: Self-pay | Admitting: Family Medicine

## 2021-07-15 ENCOUNTER — Other Ambulatory Visit: Payer: Self-pay | Admitting: Family Medicine

## 2021-07-15 DIAGNOSIS — K219 Gastro-esophageal reflux disease without esophagitis: Secondary | ICD-10-CM

## 2021-07-15 MED ORDER — ESOMEPRAZOLE MAGNESIUM 20 MG PO CPDR: DELAYED_RELEASE_CAPSULE | ORAL | 1 refills | Status: DC

## 2021-07-22 DIAGNOSIS — H40013 Open angle with borderline findings, low risk, bilateral: Secondary | ICD-10-CM | POA: Diagnosis not present

## 2021-09-01 ENCOUNTER — Other Ambulatory Visit: Payer: Self-pay

## 2021-09-01 ENCOUNTER — Encounter (HOSPITAL_COMMUNITY): Payer: Self-pay | Admitting: Emergency Medicine

## 2021-09-01 ENCOUNTER — Ambulatory Visit (HOSPITAL_COMMUNITY)
Admission: EM | Admit: 2021-09-01 | Discharge: 2021-09-01 | Disposition: A | Discharge status: Home / Self Care | Payer: Medicare Other | Attending: Emergency Medicine | Admitting: Emergency Medicine

## 2021-09-01 DIAGNOSIS — S8012XA Contusion of left lower leg, initial encounter: Secondary | ICD-10-CM | POA: Diagnosis not present

## 2021-09-01 MED ORDER — DICLOFENAC SODIUM 1 % EX GEL: 2.0000 g | Freq: Four times a day (QID) | CUTANEOUS | 0 refills | Status: DC

## 2021-09-01 NOTE — ED Triage Notes (Signed)
Pt reports is packing up to move. Unsure if bumped LLE or not. Reports feels a knot on posterior LLE. Reports had superficial blood clot before. Denies being on blood thinners.

## 2021-09-01 NOTE — Discharge Instructions (Addendum)
The area of concern appears to be a superficial hematoma which typically will be reabsorbed without complications  You may apply diclofenac gel 4 times a day as needed to affected area  At any point if you begin to have persistent calf pain, your skin feels hot you begin to have calf swelling you begin to have shortness of breath, difficulty breathing pain or tightness please go to the nearest emergency for evaluation

## 2021-09-01 NOTE — ED Provider Notes (Signed)
[place]    CSN: [id number] Arrival date & time: 09/01/21  0825      History   Chief Complaint Chief Complaint  Patient presents with   Leg Pain    HPI Roberta [name] is a 64 y.o. female.   Patient presents with tender knot on the posterior lower left extremity for 1 day.  Was moving and packing boxes yesterday and unsure if leg bumped object.  Range of motion intact. denies swelling, calf pain, warmth to skin, shortness of breath, chest pain or tightness, difficulty breathing, wheezing, or tingling, prior injury or trauma.  History of asthma, hyperlipidemia, hypertension, prediabetes cervical cancer.  Has not attempted treatment.  Past Medical History:  Diagnosis Date   Abnormal EKG    nonspecific ST and T-wave changes - Followed by Dr.Berry   ALLERGIC RHINITIS 10/14/2010   Anemia    Asthma    ASTHMA, INTERMITTENT 02/16/2007   BACK PAIN, CHRONIC 03/25/2009   CERVICAL RADICULOPATHY 10/16/2009   DDD (degenerative disc disease), lumbosacral    Depression    DEPRESSIVE DISORDER NOT ELSEWHERE CLASSIFIED 03/25/2009   Endometrial cancer ([place])    GERD (gastroesophageal reflux disease) 09/09/2011   Glaucoma    sees optho every 3 months, drops each night   Hyperlipidemia    Hypertension    PONV (postoperative nausea and vomiting)    Postmenopausal vaginal bleeding 09/24/2014    Patient Active Problem List   Diagnosis Date Noted   Localized swelling of right lower extremity 03/11/2020   Edema of right lower extremity 01/04/2020   Stress 01/04/2020   History of anaphylaxis 09/12/2019   Pre-diabetes 05/23/2019   Localized swelling, mass or lump of neck 10/26/2018   Neuropathy 04/18/2018   Pineal gland cyst 10/01/2015   Exophthalmos 10/01/2015   Endometrial cancer ([place]) 08/11/2015   Atypical endometrial hyperplasia 07/28/2015   Hearing loss, sensorineural, asymmetrical 01/19/2015   Benign paroxysmal positional vertigo 09/24/2014   Healthcare maintenance  04/24/2014   Uterine fibroid 01/04/2014   Cervical disc disorder with radiculopathy of cervical region 10/24/2013   Glaucoma    GERD (gastroesophageal reflux disease) 09/09/2011   Gastric polyp 09/09/2011   Allergic rhinitis 10/14/2010   BREAST MASS, BENIGN 02/04/2010   BACK PAIN, CHRONIC 03/25/2009   HYPERLIPIDEMIA 02/16/2007   OBESITY, NOS 02/16/2007   HYPERTENSION, BENIGN SYSTEMIC 02/16/2007   Asthma 02/16/2007    Past Surgical History:  Procedure Laterality Date   ANKLE SURGERY     BIOPSY BREAST     LEFT   BREAST CYST INCISION AND DRAINAGE     under breast   BUNIONECTOMY     bilateral and toe correction   CARPAL TUNNEL RELEASE Bilateral 2009   CATARACT SURGERY     CERVICAL SPINE SURGERY     CHOLECYSTECTOMY  1980   DILATATION & CURRETTAGE/HYSTEROSCOPY WITH RESECTOCOPE N/A 07/28/2015   Procedure: DILATATION & CURETTAGE/HYSTEROSCOPY WITH RESECTOCOPE;  Surgeon: Eldred Manges, MD;  Location: [facility];  Service: Gynecology;  Laterality: N/A;   KNEE ARTHROSCOPY     ROBOTIC ASSISTED TOTAL HYSTERECTOMY WITH BILATERAL SALPINGO OOPHERECTOMY Bilateral 09/04/2015   Procedure: ROBOTIC ASSISTED HYSTERECTOMY WITH BILATERAL SALPINGO OOPHORECTOMY SENTINAL NODE MAPPING ;  Surgeon: Everitt Amber, MD;  Location: [facility];  Service: Gynecology;  Laterality: Bilateral;   ROTATOR CUFF REPAIR Right May 2014    OB History     Gravida  4   Para  3   Term  3   Preterm      AB  Living  4      SAB      IAB      Ectopic      Multiple  0   Live Births  4            Home Medications    Prior to Admission medications   Medication Sig Start Date End Date Taking? Authorizing Provider  diclofenac Sodium (VOLTAREN) 1 % GEL Apply 2 g topically 4 (four) times daily. 09/01/21  Yes Scarleth Brame R, NP  albuterol (PROAIR HFA) 108 (90 Base) MCG/ACT inhaler Inhale 2 puffs into the lungs every 4 (four) hours as needed. 08/08/20   Autry-Lott, Naaman Plummer, DO  albuterol (PROVENTIL) (2.5  MG/3ML) 0.083% nebulizer solution Take 3 mLs (2.5 mg total) by nebulization every 6 (six) hours as needed for wheezing or shortness of breath. 04/03/21   Simmons-Robinson, Mak[place], MD  Ascorbic Acid (VITAMIN C PO) Take by mouth.    [provider]  cetirizine (ZYRTEC) 10 MG tablet Take 1 tablet (10 mg total) by mouth daily. 03/30/21   Carollee Leitz, MD  cyclobenzaprine (FLEXERIL) 10 MG tablet Take 10 mg daily as needed by mouth for muscle spasms.    [provider]  dorzolamide-timolol (COSOPT) 22.3-6.8 MG/ML ophthalmic solution Place 1 drop into both eyes 2 (two) times daily. 10/05/17   Eloise Levels, MD  esomeprazole (NEXIUM) 20 MG capsule TAKE 1 CAPSULE(20 MG) BY MOUTH DAILY AS NEEDED FOR ACID REFLUX 07/15/21   Autry-Lott, Naaman Plummer, DO  hydrochlorothiazide (HYDRODIURIL) 12.5 MG tablet Take 1 tablet (12.5 mg total) by mouth daily. Patient taking differently: Take 12.5 mg by mouth daily as needed (swelling). 02/16/21   Zola Button, MD  lisinopril (ZESTRIL) 20 MG tablet Take 1 tablet (20 mg total) by mouth at bedtime. 02/16/21   Zola Button, MD  LORazepam (ATIVAN) 0.5 MG tablet Take 1 tablet (0.5 mg total) by mouth daily as needed for anxiety (airplane ride). 05/19/21   Zola Button, MD  meclizine (ANTIVERT) 12.5 MG tablet Take 1 tablet (12.5 mg total) by mouth 3 (three) times daily as needed. 11/22/20   Autry-Lott, Naaman Plummer, DO  Multiple Vitamins-Minerals (HAIR SKIN AND NAILS FORMULA PO) Take 1 tablet 3 (three) times a week by mouth.     [provider]  ondansetron (ZOFRAN) 4 MG tablet TAKE 1 TABLET BY MOUTH EVERY 8 HOURS AS NEEDED FOR NAUSEA AND VOMITING 11/22/20   Autry-Lott, Naaman Plummer, DO  traMADol (ULTRAM) 50 MG tablet Take 50 mg daily as needed by mouth for moderate pain.    [provider]  Travoprost, BAK Free, (TRAVATAN) 0.004 % SOLN ophthalmic solution Place 1 drop into both eyes at bedtime. 10/23/14   Bernadene Bell, MD    Family History Family History   Problem Relation Age of Onset   Heart disease Mother        MI in 3s   Lymphoma Mother        related to asbestos   Cancer Mother        lymphoma (asbestos exposure)   Alcoholism Father    Cirrhosis Father        due to alcohol   Asthma Father    Cancer Maternal Uncle        lung   Cancer Maternal Uncle        lung    Social History Social History   Tobacco Use   Smoking status: Never   Smokeless tobacco: Never  Vaping Use   Vaping Use:  Never used  Substance Use Topics   Alcohol use: No    Alcohol/week: 0.0 standard drinks   Drug use: No     Allergies   Influenza vaccines, Budesonide, Flonase [fluticasone propionate], Codeine, Morphine and related, and Nickel   Review of Systems Review of Systems  Constitutional: Negative.   Respiratory: Negative.    Cardiovascular: Negative.   Musculoskeletal: Negative.   Neurological: Negative.     Physical Exam Triage Vital Signs ED Triage Vitals  Enc Vitals Group     BP 09/01/21 0859 (!) 140/91     Pulse Rate 09/01/21 0859 75     Resp --      Temp 09/01/21 0859 98.6 F (37 C)     Temp Source 09/01/21 0859 Oral     SpO2 09/01/21 0859 97 %     Weight --      Height --      Head Circumference --      Peak Flow --      Pain Score 09/01/21 0905 5     Pain Loc --      Pain Edu? --      Excl. in [place]? --    No data found.  Updated Vital Signs BP (!) 140/91 (BP Location: Left Arm)   Pulse 75   Temp 98.6 F (37 C) (Oral)   SpO2 97%   Visual Acuity Right Eye Distance:   Left Eye Distance:   Bilateral Distance:    Right Eye Near:   Left Eye Near:    Bilateral Near:     Physical Exam Constitutional:      Appearance: Normal appearance. She is normal weight.  HENT:     Head: Normocephalic.  Eyes:     Extraocular Movements: Extraocular movements intact.  Pulmonary:     Effort: Pulmonary effort is normal.  Skin:         Comments: 2 cm hematoma on the posterior aspect of the left lower extremity, no  swelling, tenderness noted, skin warm to touch, range of motion intact, 2+ popliteal and dorsalis pedis pulse, skin color appropriate for ethnicity  Neurological:     Mental Status: She is alert and oriented to person, place, and time. Mental status is at baseline.  Psychiatric:        Mood and Affect: Mood normal.        Behavior: Behavior normal.     UC Treatments / Results  Labs (all labs ordered are listed, but only abnormal results are displayed) Labs Reviewed - No data to display  EKG   Radiology No results found.  Procedures Procedures (including critical care time)  Medications Ordered in UC Medications - No data to display  Initial Impression / Assessment and Plan / UC Course  I have reviewed the triage vital signs and the nursing notes.  Pertinent labs & imaging results that were available during my care of the patient were reviewed by me and considered in my medical decision making (see chart for details).  Hematoma of left lower leg  Discussed with patient expected resolution of symptoms and given strict return precautions to go to nearest emergency department for signs of DVT or PE, patient not on blood thinners, verbalized understanding, prescribed diclofenac gel to be used as needed for comfort Final Clinical Impressions(s) / UC Diagnoses   Final diagnoses:  Hematoma of left lower leg     Discharge Instructions      The area of concern appears to be a  superficial hematoma which typically will be reabsorbed without complications  You may apply diclofenac gel 4 times a day as needed to affected area  At any point if you begin to have persistent calf pain, your skin feels hot you begin to have calf swelling you begin to have shortness of breath, difficulty breathing pain or tightness please go to the nearest emergency for evaluation   ED Prescriptions     Medication Sig Dispense Auth. Provider   diclofenac Sodium (VOLTAREN) 1 % GEL Apply 2 g topically  4 (four) times daily. 50 g Hans Eden, NP      PDMP not reviewed this encounter.   Hans Eden, [place] 09/01/21 [phone]

## 2021-10-05 ENCOUNTER — Other Ambulatory Visit: Payer: Self-pay | Admitting: Family Medicine

## 2021-10-07 ENCOUNTER — Telehealth: Payer: Self-pay

## 2021-10-07 DIAGNOSIS — Z86010 Personal history of colonic polyps: Secondary | ICD-10-CM | POA: Diagnosis not present

## 2021-10-07 DIAGNOSIS — R1013 Epigastric pain: Secondary | ICD-10-CM | POA: Diagnosis not present

## 2021-10-07 DIAGNOSIS — K219 Gastro-esophageal reflux disease without esophagitis: Secondary | ICD-10-CM | POA: Diagnosis not present

## 2021-10-07 NOTE — Telephone Encounter (Signed)
Patient calls nurse line regarding elevated BP. Patient reports that she was having a "colon check up" and that BP was elevated during this visit. Unable to find these records in the chart, however, patient reports systolic BP [id number] and diastolic BP high [id number]-431V.  Patient is asymptomatic. Scheduled for next available appointment on 10/19/2021.  ED precautions given.   Talbot Grumbling, RN

## 2021-10-19 ENCOUNTER — Encounter: Payer: Self-pay | Admitting: Student

## 2021-10-19 ENCOUNTER — Ambulatory Visit (INDEPENDENT_AMBULATORY_CARE_PROVIDER_SITE_OTHER): Payer: Medicare Other | Admitting: Student

## 2021-10-19 ENCOUNTER — Other Ambulatory Visit: Payer: Self-pay

## 2021-10-19 VITALS — BP 148/101 | HR 66 | Ht 66.0 in | Wt 255.2 lb

## 2021-10-19 DIAGNOSIS — Z0000 Encounter for general adult medical examination without abnormal findings: Secondary | ICD-10-CM

## 2021-10-19 DIAGNOSIS — I10 Essential (primary) hypertension: Secondary | ICD-10-CM

## 2021-10-19 DIAGNOSIS — F439 Reaction to severe stress, unspecified: Secondary | ICD-10-CM

## 2021-10-19 NOTE — Assessment & Plan Note (Signed)
152/99 in Left Lower and 148/101 in right upper. (Taking HCTZ 1/week for swelling) -Advised 1.5 tablets lisinopril instead of 2 tablets she was taking (due to feeling lightheaded) -f/u in 4 weeks for BP and check kidney function

## 2021-10-19 NOTE — Assessment & Plan Note (Addendum)
Significant stress in her home life regarding neighbors. Gave list of counselors/therapists for her and she wants to start after her move. Could also be playing into elevated BP in office.
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
## 2021-10-19 NOTE — Patient Instructions (Addendum)
It was great to see you! Thank you for allowing me to participate in your care!   I recommend that you always bring your medications to each appointment as this makes it easy to ensure we are on the correct medications and helps [place] not miss when refills are needed.  Our plans for today:  - I have attached a list of counselors below for you in case you want to talk about your situation more! - Please take 1 and a half tablets of lisinopril for your blood pressure to see if that helps. Continue taking your HCTZ as you normally do (as needed). We will have you come back in a month to check your BP and kidney levels. Please check your blood pressures and write down what they are for next time. Let us know if you have any issues! -I put a referral in for a DEXA scan  Take care and seek immediate care sooner if you develop any concerns. Please remember to show up 15 minutes before your scheduled appointment time!  Gerrit Heck, MD [facility]    Therapy and Counseling Resources Most providers on this list will take Medicaid. Patients with commercial insurance or Medicare should contact their insurance company to get a list of in network providers.  [facility]:Psychiatry and Counseling [address] [facility] [place]. [place], [place] [address] [place], [place], [place] [address]      [place] [address]  [place], [place] [address] [phone]  [place] [address], [place]  [place], [place] [address]       [phone]     [facility] (virtual only) [phone]  [facility] Bl[facility] [address], [place], [place]  Family Solutions:  [place]. [place] [phone]  [facility] Counseling:  [place] [address] [phone]  [facility] (under & uninsured) [address] [place] [phone]    [email or web address]    [place] [address]  [place]    [phone]  [facility] [place]     Phone:  [phone]     [place]  [place] [address]      [place], [place] ext [place]: [place], [place], [place]   [place] ([place] therapist) [email or web address]  [place] [address]   [place] [address]    [phone]    [facility]   [address],  [place], [place]   [place]  [place]  [facility]  [address] [place], [place]        [phone]  Open Access/Walk In Clinic under & uninsured  [facility]  [address] [place], [place] Crisis [phone]  [facility] of the [place],  ([place])   [place], [place]: [phone]) 8:30 - 12; 1 - 2:30  Family Service of the [place],  [place], [place]    ([phone]):8:30 - 12; 2 - 3PM  [facility] [employer],  [address],  [place]; [phone]):   Mon -  Fri 8 AM - 5 PM  [facility] [place]  MWF 12:30 to 3:00 or call to schedule an appointment  [phone]  Specific Provider options Psychology Today  [email or web address] click on find a therapist  enter your zip code left side and select or tailor a therapist for your specific need.   [facility] Provider Directory [email or web address]  (Medicaid)   Follow all drop down to find a provider  [place] or [email or web address] [address], [place], [place] Recovery support  and educational   24- Hour Availability:   [facility]  [address] [place], [place] Crisis [phone]  [facility] Service of the [employer] [phone]  [place]  [phone]   [place]  [phone] (after hours)  Therapeutic Alternative/Mobile Crisis   [phone]  [place] National Suicide Hotline  [phone] Diamantina Monks)  Call 911 or go to emergency room  [facility]  [phone]);  [place] and [employer]  [phone]); [place], [place], [place], [place], [place], [place], [place]

## 2021-10-19 NOTE — Assessment & Plan Note (Signed)
Counseled on vaccines, not interested in covid, pcv, shingrix currently. -DEXA ordered.

## 2021-10-29 DIAGNOSIS — M1711 Unilateral primary osteoarthritis, right knee: Secondary | ICD-10-CM | POA: Diagnosis not present

## 2021-11-16 ENCOUNTER — Ambulatory Visit (INDEPENDENT_AMBULATORY_CARE_PROVIDER_SITE_OTHER): Payer: Medicare Other | Admitting: Family Medicine

## 2021-11-16 ENCOUNTER — Other Ambulatory Visit: Payer: Self-pay

## 2021-11-16 ENCOUNTER — Encounter: Payer: Self-pay | Admitting: Family Medicine

## 2021-11-16 VITALS — BP 158/97 | HR 72 | Ht 66.0 in | Wt 254.4 lb

## 2021-11-16 DIAGNOSIS — L989 Disorder of the skin and subcutaneous tissue, unspecified: Secondary | ICD-10-CM

## 2021-11-16 DIAGNOSIS — I10 Essential (primary) hypertension: Secondary | ICD-10-CM | POA: Diagnosis not present

## 2021-11-16 NOTE — Patient Instructions (Addendum)
Thank for coming in today.  Today we discussed a few skin findings that we think are pimples that are healing on their own.  If you find that they are increasing in size becoming more painful or developing redness please call us back and request to be seen in our dermatology clinic.  At this time I will not make any medication changes for your blood pressure.  Please continue to monitor at home.  Your goal is less than 140/90.  Dr. Janus Molder

## 2021-11-16 NOTE — Progress Notes (Signed)
    SUBJECTIVE:   CHIEF COMPLAINT / HPI:   Ms. Leitz is a 65 yo F who presents for the reasons below.   Lesion on neck and left side Neck: there for 3-4 years appear to be enlarging when she recently took her braids out last weekend.  Wants someone to look at it to see if anything needs to be done.  It only bothers her cosmetically it is not painful or pruritic. Left side: Noticed that over the weekend is located under her left breast and is painful.  She has been wearing a Band-Aid over it so that it does not rub up against her skin.  She denies any fevers known trauma in the area.  Hypertension: - Medications: Hydrochlorothiazide 12.5 mg daily as needed for LE edema, lisinopril 20 mg nightly - Compliance: Yes - Checking BP at home: Yes 130s over 90s - Denies any SOB, CP, vision changes, LE edema, medication SEs, or symptoms of hypotension  PERTINENT  PMH / PSH: Hx of folliculitis  OBJECTIVE:   BP (!) 128/96   Pulse 83   Ht 5\' 6"  (1.676 m)   Wt 254 lb 6.4 oz (115.4 kg)   SpO2 98%   BMI 41.06 kg/m   General: Appears well, no acute distress. Age appropriate. Respiratory:  normal effort Extremities: No edema or cyanosis. Skin: Neck <1cm nodule that is easily compressible and non-painful. No surrounding erythema or drainage. Left side under breast painful to palpation <1cm nodule that is non-compressible. No surrounding erythema or drainage.  Media Information  Media Information   Neuro: alert and oriented   ASSESSMENT/PLAN:   1. Nodular lesion on surface of skin These appears to be simple pimples or hair follicles that are healing on their own. We can conservatively watch them for now. Suspicion for infection is very low given their appearance and lack of systemic symptoms. Discussed dermatology clinic with patient if not improving.   2. HYPERTENSION, BENIGN SYSTEMIC BP initially close to goal. Patient desired repeat which was more elevated. Discussed BP goals and  medication adherence. Will continue to monitor at subsequent visits.    Gerlene Fee, [place]

## 2021-12-07 ENCOUNTER — Encounter: Payer: Self-pay | Admitting: Family Medicine

## 2021-12-07 DIAGNOSIS — R112 Nausea with vomiting, unspecified: Secondary | ICD-10-CM

## 2021-12-08 MED ORDER — ONDANSETRON HCL 4 MG PO TABS: ORAL_TABLET | ORAL | 0 refills | Status: DC

## 2021-12-08 MED ORDER — LISINOPRIL 20 MG PO TABS: 20.0000 mg | ORAL_TABLET | Freq: Every day | ORAL | 3 refills | Status: DC

## 2021-12-29 DIAGNOSIS — M65331 Trigger finger, right middle finger: Secondary | ICD-10-CM | POA: Diagnosis not present

## 2022-01-10 ENCOUNTER — Other Ambulatory Visit: Payer: Self-pay | Admitting: Family Medicine

## 2022-01-10 DIAGNOSIS — K219 Gastro-esophageal reflux disease without esophagitis: Secondary | ICD-10-CM

## 2022-01-20 ENCOUNTER — Encounter: Payer: Self-pay | Admitting: Family Medicine

## 2022-01-20 ENCOUNTER — Other Ambulatory Visit: Payer: Self-pay

## 2022-01-20 ENCOUNTER — Ambulatory Visit (INDEPENDENT_AMBULATORY_CARE_PROVIDER_SITE_OTHER): Payer: Medicare Other | Admitting: Family Medicine

## 2022-01-20 VITALS — BP 172/100 | HR 90 | Wt 245.6 lb

## 2022-01-20 DIAGNOSIS — I10 Essential (primary) hypertension: Secondary | ICD-10-CM

## 2022-01-20 DIAGNOSIS — K317 Polyp of stomach and duodenum: Secondary | ICD-10-CM | POA: Diagnosis not present

## 2022-01-20 DIAGNOSIS — K2930 Chronic superficial gastritis without bleeding: Secondary | ICD-10-CM | POA: Diagnosis not present

## 2022-01-20 DIAGNOSIS — K648 Other hemorrhoids: Secondary | ICD-10-CM | POA: Diagnosis not present

## 2022-01-20 DIAGNOSIS — K2970 Gastritis, unspecified, without bleeding: Secondary | ICD-10-CM | POA: Diagnosis not present

## 2022-01-20 DIAGNOSIS — R1013 Epigastric pain: Secondary | ICD-10-CM | POA: Diagnosis not present

## 2022-01-20 DIAGNOSIS — K5730 Diverticulosis of large intestine without perforation or abscess without bleeding: Secondary | ICD-10-CM | POA: Diagnosis not present

## 2022-01-20 DIAGNOSIS — Z86010 Personal history of colonic polyps: Secondary | ICD-10-CM | POA: Diagnosis not present

## 2022-01-20 DIAGNOSIS — D124 Benign neoplasm of descending colon: Secondary | ICD-10-CM | POA: Diagnosis not present

## 2022-01-20 MED ORDER — LISINOPRIL 30 MG PO TABS: 30.0000 mg | ORAL_TABLET | Freq: Every day | ORAL | 3 refills | Status: DC

## 2022-01-20 NOTE — Patient Instructions (Signed)
Thank for coming in today.  We will increase your lisinopril to 30 mg daily.  Follow-up in 2 weeks for blood pressure recheck.

## 2022-01-20 NOTE — Progress Notes (Signed)
° ° °  SUBJECTIVE:   CHIEF COMPLAINT / HPI:   Roberta [name] is a 66  yo F who presents for follow up on the below.   Hypertension: - Medications: Lisinopril 20 mg daily, HCTZ 12.5 mg PRN for LE swelling - Compliance: Yes - Checking BP at home: unknown - Denies any SOB, CP, vision changes, LE edema, medication SEs, or symptoms of hypotension  PERTINENT  PMH / PSH: GERD, hx of endometrial cancer, HLD, pre diabetes  OBJECTIVE:   BP (!) 172/100    Pulse 90    Wt 245 lb 9.6 oz (111.4 kg)    SpO2 99%    BMI 39.64 kg/m   General: Appears well, no acute distress. Age appropriate. Cardiac: RRR, normal heart sounds, no murmurs Respiratory: CTAB, normal effort Extremities: No edema or cyanosis. Neuro: alert and oriented, no focal deficits Psych: normal affect  ASSESSMENT/PLAN:   HYPERTENSION, BENIGN SYSTEMIC Uncontrolled. Significantly elevated. Asymptomatic. Will increase lisinopril to 30 mg and consider changing HCTZ from PRN to daily. Obtain BMP now and follow up in 2 weeks for BP check and repeat BMP.  - Basic Metabolic Panel - lisinopril (ZESTRIL) 30 MG tablet; Take 1 tablet (30 mg total) by mouth at bedtime.  Dispense: 90 tablet; Refill: [place]

## 2022-01-21 LAB — BASIC METABOLIC PANEL
BUN/Creatinine Ratio: 11 — ABNORMAL LOW (ref 12–28)
BUN: 10 mg/dL (ref 8–27)
CO2: 26 mmol/L (ref 20–29)
Calcium: 9.9 mg/dL (ref 8.7–10.3)
Chloride: 106 mmol/L (ref 96–106)
Creatinine, Ser: 0.93 mg/dL (ref 0.57–1.00)
Glucose: 104 mg/dL — ABNORMAL HIGH (ref 70–99)
Potassium: 4.5 mmol/L (ref 3.5–5.2)
Sodium: 145 mmol/L — ABNORMAL HIGH (ref 134–144)
eGFR: 67 mL/min/{1.73_m2} (ref >59)

## 2022-01-27 DIAGNOSIS — K2930 Chronic superficial gastritis without bleeding: Secondary | ICD-10-CM | POA: Diagnosis not present

## 2022-01-27 DIAGNOSIS — D124 Benign neoplasm of descending colon: Secondary | ICD-10-CM | POA: Diagnosis not present

## 2022-02-02 NOTE — Progress Notes (Signed)
° ° °  SUBJECTIVE:   CHIEF COMPLAINT / HPI:   Hypertension - Medications: Lisinopril 30 mg daily, HCTZ 12.5 mg PRN for LE swelling - Compliance: Yes - Checking BP at home: Yes, with wrist cuff. She brought it today and reading of 129/81.  - Denies any SOB, CP, vision changes, LE edema, medication SEs, or symptoms of hypotension  PERTINENT  PMH / PSH: HLD  OBJECTIVE:   BP 129/85    Pulse 72    Wt 245 lb 9.6 oz (111.4 kg)    SpO2 99%    BMI 39.64 kg/m   General: Appears well, no acute distress. Age appropriate. Cardiac: RRR, normal heart sounds, no murmurs Respiratory: CTAB, normal effort Neuro: alert and oriented, no focal deficits Psych: normal affect  ASSESSMENT/PLAN:   1. HYPERTENSION, BENIGN SYSTEMIC BP at goal today. Will check BMP with increase in ACE. Continue current dose. Will monitor at subsequent visit. - Basic Metabolic Panel     Gerlene Fee, [place]

## 2022-02-03 ENCOUNTER — Other Ambulatory Visit: Payer: Self-pay

## 2022-02-03 ENCOUNTER — Ambulatory Visit (INDEPENDENT_AMBULATORY_CARE_PROVIDER_SITE_OTHER): Payer: Medicare Other | Admitting: Family Medicine

## 2022-02-03 VITALS — BP 129/85 | HR 72 | Ht 66.0 in | Wt 245.6 lb

## 2022-02-03 DIAGNOSIS — I10 Essential (primary) hypertension: Secondary | ICD-10-CM | POA: Diagnosis not present

## 2022-02-03 NOTE — Patient Instructions (Signed)
Thank you for coming in today.  Your blood pressure is at your goal of less than 140/90.  Continue lisinopril 30 mg.  I will call you with results if abnormal otherwise I will communicate via MyChart.  Follow-up as needed.   Dr. Janus Molder

## 2022-02-04 LAB — BASIC METABOLIC PANEL
BUN/Creatinine Ratio: 13 (ref 12–28)
BUN: 13 mg/dL (ref 8–27)
CO2: 26 mmol/L (ref 20–29)
Calcium: 9.9 mg/dL (ref 8.7–10.3)
Chloride: 102 mmol/L (ref 96–106)
Creatinine, Ser: 0.97 mg/dL (ref 0.57–1.00)
Glucose: 97 mg/dL (ref 70–99)
Potassium: 4.2 mmol/L (ref 3.5–5.2)
Sodium: 141 mmol/L (ref 134–144)
eGFR: 64 mL/min/{1.73_m2} (ref >59)

## 2022-02-05 DIAGNOSIS — Z1231 Encounter for screening mammogram for malignant neoplasm of breast: Secondary | ICD-10-CM | POA: Diagnosis not present

## 2022-02-12 ENCOUNTER — Telehealth: Payer: Self-pay | Admitting: *Deleted

## 2022-02-12 NOTE — Telephone Encounter (Signed)
Called patient back regarding her swollen salivary glands.  She has seen [facility] in past for this same complaint.  Patient thought she would need a referral but their office went ahead and scheduled her since she has been there within 3 years.  Sharnese Heath,CMA

## 2022-02-17 ENCOUNTER — Ambulatory Visit (INDEPENDENT_AMBULATORY_CARE_PROVIDER_SITE_OTHER): Payer: Medicare Other | Admitting: Family Medicine

## 2022-02-17 ENCOUNTER — Other Ambulatory Visit: Payer: Self-pay

## 2022-02-17 ENCOUNTER — Encounter: Payer: Self-pay | Admitting: Family Medicine

## 2022-02-17 VITALS — BP 164/91 | HR 77 | Wt 250.8 lb

## 2022-02-17 DIAGNOSIS — K1370 Unspecified lesions of oral mucosa: Secondary | ICD-10-CM | POA: Diagnosis not present

## 2022-02-17 DIAGNOSIS — F419 Anxiety disorder, unspecified: Secondary | ICD-10-CM

## 2022-02-17 DIAGNOSIS — I10 Essential (primary) hypertension: Secondary | ICD-10-CM | POA: Diagnosis not present

## 2022-02-17 DIAGNOSIS — F32A Depression, unspecified: Secondary | ICD-10-CM | POA: Diagnosis not present

## 2022-02-17 DIAGNOSIS — M79605 Pain in left leg: Secondary | ICD-10-CM

## 2022-02-17 MED ORDER — HYDROCHLOROTHIAZIDE 12.5 MG PO TABS: 12.5000 mg | ORAL_TABLET | Freq: Every day | ORAL | 0 refills | Status: DC

## 2022-02-17 NOTE — Patient Instructions (Addendum)
[email or web address] ?Start taking hydrochlorothiazide daily along with your lisinopril.  Follow-up in 1 week. ?If your leg soreness continues please let me know about it and then a week and I can send you for an ultrasound. ?Follow-up with ENT for the lump in your tongue. ? ? ?Psychiatry Resource List (Adults and Children) ?Most of these providers will take Medicaid. please consult your insurance for a complete and updated list of available providers. When calling to make an appointment have your insurance information available to confirm you are covered. ? ? ?[facility] ?[place]. [place], [place] [address] ?[phone] ? ?[facility]  ?[place], [place] ?[place] [phone] ?[place] [phone] ? ? ?[place]:   ?[place]: [address]     [phone]   ?[place]: [id number],        [phone] ?[place]: [address] [place],    [phone] ?5 ?[place]: [address],                   [phone] ?Children: [facility] [place] [address] [phone] ? ?[facility] (virtual only) ?[phone] ? ? ?[facility]  (Psychiatry only; Adults /children 12 and over, will take Medicaid)  ?[place], [place], [place] [address]       [phone] ? ? ?[facility] (Psychiatry & counseling ; adults & children ; will take Medicaid ?[address]  [place] [address]  ?Go on-line to complete referral ( [email or web address] ?[phone]    ?(Spanish speaking therapists) ? ?[facility]  Psychiatry & counseling; Adults and children;  ?Call Registration prior to scheduling an appointment [phone] ?[address]    [place], [place] [address]    [phone] ? ?[facility] (Psychiatry & counseling;  adults & children; Medicare no Medicaid)  ?[place], [place]  [address]      [phone]   ? ?[facility] (up to age 34)  ?Psychiatry & counseling ,will take Medicaid, must do counseling to receive psychiatry services  ?[place] [address]        [phone] ? ?[facility] (Psychiatry & counseling; adults & children; will take Medicaid) ?Will need a referral from provider ?[address] [place],  [place], [place]  [phone] ? ? ?[facility] --- Walk-In Mon-Friday 8am-3pm ( will take Medicaid, Psychiatry, Adults & children,  ?[address] [place], [place]   [phone] ? ? ?[facility]--, Walk-in M-F 8am-12pm and 1pm -3pm   ?(Counseling, Psychiatry, will take Medicaid, adults & children) ? [address], [place], [place]  [phone]   ? ? ?
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
## 2022-02-18 DIAGNOSIS — M19041 Primary osteoarthritis, right hand: Secondary | ICD-10-CM | POA: Diagnosis not present

## 2022-02-18 DIAGNOSIS — M67441 Ganglion, right hand: Secondary | ICD-10-CM | POA: Diagnosis not present

## 2022-02-23 DIAGNOSIS — K115 Sialolithiasis: Secondary | ICD-10-CM | POA: Diagnosis not present

## 2022-02-24 ENCOUNTER — Ambulatory Visit (INDEPENDENT_AMBULATORY_CARE_PROVIDER_SITE_OTHER): Payer: Medicare Other | Admitting: Family Medicine

## 2022-02-24 ENCOUNTER — Other Ambulatory Visit: Payer: Self-pay | Admitting: Physician Assistant

## 2022-02-24 ENCOUNTER — Encounter: Payer: Self-pay | Admitting: Family Medicine

## 2022-02-24 ENCOUNTER — Other Ambulatory Visit: Payer: Self-pay

## 2022-02-24 VITALS — BP 142/82 | HR 74 | Wt 251.2 lb

## 2022-02-24 DIAGNOSIS — Z87898 Personal history of other specified conditions: Secondary | ICD-10-CM | POA: Diagnosis not present

## 2022-02-24 DIAGNOSIS — I10 Essential (primary) hypertension: Secondary | ICD-10-CM | POA: Diagnosis not present

## 2022-02-24 DIAGNOSIS — R7303 Prediabetes: Secondary | ICD-10-CM | POA: Diagnosis not present

## 2022-02-24 DIAGNOSIS — R7309 Other abnormal glucose: Secondary | ICD-10-CM

## 2022-02-24 DIAGNOSIS — K115 Sialolithiasis: Secondary | ICD-10-CM

## 2022-02-24 LAB — POCT GLYCOSYLATED HEMOGLOBIN (HGB A1C): HbA1c, POC (prediabetic range): 6.2 % (ref 5.7–6.4)

## 2022-02-24 MED ORDER — OZEMPIC (0.25 OR 0.5 MG/DOSE) 2 MG/1.5ML ~~LOC~~ SOPN: 0.5000 mg | PEN_INJECTOR | SUBCUTANEOUS | 0 refills | Status: DC

## 2022-02-24 NOTE — Progress Notes (Addendum)
? ? ?  SUBJECTIVE:  ? ?CHIEF COMPLAINT / HPI:  ? ?Hypertension ?Medications: Lisinopril 30 mg daily, HCTZ 12.5 mg daily ?- Compliance: Yes ?- Checking BP at home: Yes, with wrist cuff reading 140/80 ?- Denies any SOB, CP, vision changes, LE edema, medication SEs, or symptoms of hypotension ? ?Hx of prediabetes ?Several years ago. Desires injectable for weight loss. Has lost a lot of weight by walking. 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 ? ?HM ?Declines Shingles vaccine, PCV, and COVID booster. Afraid to get COVID booster due to losing 2 family members shortly after they received the booster.  ? ?PERTINENT  PMH / PSH:  ? ?OBJECTIVE:  ? ?BP (!) 142/82   Pulse 74   Wt 251 lb 3.2 oz (113.9 kg)   SpO2 98%   BMI 40.54 kg/m?   ?General: Appears well, no acute distress. Age appropriate. ?Cardiac: RRR, normal heart sounds, no murmurs ?Respiratory: CTAB, normal effort ?Extremities: No LE edema or cyanosis. ?Neuro: alert and oriented ?Psych: normal affect ? ?ASSESSMENT/PLAN:  ? ?1. HYPERTENSION, BENIGN SYSTEMIC ?Within goal today. Continue current medications. Continue to monitor with subsequent visits. ?- Basic Metabolic Panel ? ?2. Pre-diabetes ?Hgb 6.3 02/04/21. Repeat today. Prediabetes likely improved by weight loss. Will start ozempic to further aid in weight loss and pre diabetes.  ?- HgB A1c ?- Semaglutide,0.25 or 0.'5MG'$ /DOS, (OZEMPIC, 0.25 OR 0.5 MG/DOSE,) 2 MG/1.5ML SOPN; Inject 0.5 mg into the skin once a week.  Dispense: 3 mL; Refill: 0 ?- Increase every 4 weeks ?- Follow up in 1 month for titration and tolerance ?- Repeat A1c in 3 months ? ?Rudra Hobbins Autry-Lott, DO ?[place]  ?

## 2022-02-24 NOTE — Patient Instructions (Addendum)
It was wonderful to see you today. ? ?Please bring ALL of your medications with you to every visit.  ? ?Today we talked about: ? ?Blood pressure-within goal today.  Continue current medications. ?Elevated A1c reading-repeated labs today.  Also prescribed Ozempic for possible prediabetes and weight loss. ? ?Please be sure to schedule follow up at the front  desk before you leave today.  ? ?If you haven't already, sign up for My Chart to have easy access to your labs results, and communication with your primary care physician. ? ?Please call the clinic at [phone] if your symptoms worsen or you have any concerns. It was our pleasure to serve you. ? ?Dr. Janus Molder ? ?

## 2022-02-25 ENCOUNTER — Other Ambulatory Visit: Payer: Self-pay | Admitting: Family Medicine

## 2022-02-25 DIAGNOSIS — Z87898 Personal history of other specified conditions: Secondary | ICD-10-CM

## 2022-02-25 LAB — BASIC METABOLIC PANEL WITH GFR
BUN/Creatinine Ratio: 12 (ref 12–28)
BUN: 11 mg/dL (ref 8–27)
CO2: 26 mmol/L (ref 20–29)
Calcium: 9.7 mg/dL (ref 8.7–10.3)
Chloride: 106 mmol/L (ref 96–106)
Creatinine, Ser: 0.95 mg/dL (ref 0.57–1.00)
Glucose: 91 mg/dL (ref 70–99)
Potassium: 4.9 mmol/L (ref 3.5–5.2)
Sodium: 142 mmol/L (ref 134–144)
eGFR: 66 mL/min/{1.73_m2}

## 2022-02-25 MED ORDER — OZEMPIC (0.25 OR 0.5 MG/DOSE) 2 MG/1.5ML ~~LOC~~ SOPN: 0.2500 mg | PEN_INJECTOR | SUBCUTANEOUS | 0 refills | Status: DC

## 2022-02-25 NOTE — Progress Notes (Signed)
Called patient. Has not picked up semaglutide. Will change script to 0.25 mg instead of 0.5 mg. Message left with pharmacy. 0.5 mg script cancelled. Patient aware. Plan to follow up in 1 month.  ? ?Gerlene Fee, DO ?02/25/2022, 4:01 PM ?PGY-3, [place] ? ?

## 2022-03-04 ENCOUNTER — Ambulatory Visit (INDEPENDENT_AMBULATORY_CARE_PROVIDER_SITE_OTHER): Payer: Medicare Other

## 2022-03-04 ENCOUNTER — Other Ambulatory Visit: Payer: Self-pay

## 2022-03-04 DIAGNOSIS — Z0000 Encounter for general adult medical examination without abnormal findings: Secondary | ICD-10-CM | POA: Diagnosis not present

## 2022-03-04 NOTE — Patient Instructions (Addendum)
Thank you for taking time to come for your Medicare Wellness Visit. I appreciate your ongoing commitment to your health goals. Please review the following plan we discussed and let me know if I can assist you in the future.  ?  ?These are the goals we discussed: ? ? Goals   ? ?  Exercise 3x per week (30 min per time)   ? ?  ? ?We also discussed recommended health maintenance. Please call our office and schedule a visit. As discussed, you are due for: ?Health Maintenance  ?Topic Date Due  ? Zoster Vaccines- Shingrix (1 of 2) Never done  ? Pneumonia Vaccine 72+ Years old (1 - PCV) Never done  ? DEXA SCAN  Never done  ? COVID-19 Vaccine (4 - Booster for Pfizer series) 07/14/2021  ? MAMMOGRAM  01/28/2023  ? COLONOSCOPY (Pts 45-37yr Insurance coverage will need to be confirmed)  01/20/2025  ? TETANUS/TDAP  04/08/2029  ? Hepatitis C Screening  Completed  ? HPV VACCINES  Aged Out  ? ?Will discuss with PCP about next apt and call you. ?DEXA scan (bone density test) has been ordered. Get this done with your next mammogram.  ?Shingles vaccine can be given at your local pharmacy.  ? ?Preventive Care 665Years and Older, Female ?Preventive care refers to lifestyle choices and visits with your health care provider that can promote health and wellness. Preventive care visits are also called wellness exams. ?What can I expect for my preventive care visit? ?Counseling ?Your health care provider may ask you questions about your: ?Medical history, including: ?Past medical problems. ?Family medical history. ?Pregnancy and menstrual history. ?History of falls. ?Current health, including: ?Memory and ability to understand (cognition). ?Emotional well-being. ?Home life and relationship well-being. ?Sexual activity and sexual health. ?Lifestyle, including: ?Alcohol, nicotine or tobacco, and drug use. ?Access to firearms. ?Diet, exercise, and sleep habits. ?Work and work eStatistician ?Sunscreen use. ?Safety issues such as seatbelt and bike  helmet use. ?Physical exam ?Your health care provider will check your: ?Height and weight. These may be used to calculate your BMI (body mass index). BMI is a measurement that tells if you are at a healthy weight. ?Waist circumference. This measures the distance around your waistline. This measurement also tells if you are at a healthy weight and may help predict your risk of certain diseases, such as type 2 diabetes and high blood pressure. ?Heart rate and blood pressure. ?Body temperature. ?Skin for abnormal spots. ?What immunizations do I need? ?Vaccines are usually given at various ages, according to a schedule. Your health care provider will recommend vaccines for you based on your age, medical history, and lifestyle or other factors, such as travel or where you work. ?What tests do I need? ?Screening ?Your health care provider may recommend screening tests for certain conditions. This may include: ?Lipid and cholesterol levels. ?Hepatitis C test. ?Hepatitis B test. ?HIV (human immunodeficiency virus) test. ?STI (sexually transmitted infection) testing, if you are at risk. ?Lung cancer screening. ?Colorectal cancer screening. ?Diabetes screening. This is done by checking your blood sugar (glucose) after you have not eaten for a while (fasting). ?Mammogram. Talk with your health care provider about how often you should have regular mammograms. ?BRCA-related cancer screening. This may be done if you have a family history of breast, ovarian, tubal, or peritoneal cancers. ?Bone density scan. This is done to screen for osteoporosis. ?Talk with your health care provider about your test results, treatment options, and if necessary, the need for  more tests. ?Follow these instructions at home: ?Eating and drinking ? ?Eat a diet that includes fresh fruits and vegetables, whole grains, lean protein, and low-fat dairy products. Limit your intake of foods with high amounts of sugar, saturated fats, and salt. ?Take vitamin  and mineral supplements as recommended by your health care provider. ?Do not drink alcohol if your health care provider tells you not to drink. ?If you drink alcohol: ?Limit how much you have to 0-1 drink a day. ?Know how much alcohol is in your drink. In the [place] one drink equals one 12 oz bottle of beer (355 mL), one 5 oz glass of wine (148 mL), or one 1? oz glass of hard liquor (44 mL). ?Lifestyle ?Brush your teeth every morning and night with fluoride toothpaste. Floss one time each day. ?Exercise for at least 30 minutes 5 or more days each week. ?Do not use any products that contain nicotine or tobacco. These products include cigarettes, chewing tobacco, and vaping devices, such as e-cigarettes. If you need help quitting, ask your health care provider. ?Do not use drugs. ?If you are sexually active, practice safe sex. Use a condom or other form of protection in order to prevent STIs. ?Take aspirin only as told by your health care provider. Make sure that you understand how much to take and what form to take. Work with your health care provider to find out whether it is safe and beneficial for you to take aspirin daily. ?Ask your health care provider if you need to take a cholesterol-lowering medicine (statin). ?Find healthy ways to manage stress, such as: ?Meditation, yoga, or listening to music. ?Journaling. ?Talking to a trusted person. ?Spending time with friends and family. ?Minimize exposure to UV radiation to reduce your risk of skin cancer. ?Safety ?Always wear your seat belt while driving or riding in a vehicle. ?Do not drive: ?If you have been drinking alcohol. Do not ride with someone who has been drinking. ?When you are tired or distracted. ?While texting. ?If you have been using any mind-altering substances or drugs. ?Wear a helmet and other protective equipment during sports activities. ?If you have firearms in your house, make sure you follow all gun safety procedures. ?What's next? ?Visit your  health care provider once a year for an annual wellness visit. ?Ask your health care provider how often you should have your eyes and teeth checked. ?Stay up to date on all vaccines. ?This information is not intended to replace advice given to you by your health care provider. Make sure you discuss any questions you have with your health care provider. ?Document Revised: 06/03/2021 Document Reviewed: 06/03/2021 ?Elsevier Patient Education ? [place]. ? ?Bone Density Test ?A bone density test uses a type of X-ray to measure the amount of calcium and other minerals in a person's bones. It can measure bone density in the hip and the spine. The test is similar to having a regular X-ray. This test may also be called: ?Bone densitometry. ?Bone mineral density test. ?Dual-energy X-ray absorptiometry (DEXA). ?You may have this test to: ?Diagnose a condition that causes weak or thin bones (osteoporosis). ?Screen you for osteoporosis. ?Predict your risk for a broken bone (fracture). ?Determine how well your osteoporosis treatment is working. ?Tell a health care provider about: ?Any allergies you have. ?All medicines you are taking, including vitamins, herbs, eye drops, creams, and over-the-counter medicines. ?Any problems you or family members have had with anesthetic medicines. ?Any blood disorders you have. ?Any surgeries you  have had. ?Any medical conditions you have. ?Whether you are pregnant or may be pregnant. ?Any medical tests you have had within the past 14 days that used contrast material. ?What are the risks? ?Generally, this is a safe test. However, it does expose you to a small amount of radiation, which can slightly increase your cancer risk. ?What happens before the test? ?Do not take any calcium supplements within the 24 hours before your test. ?You will need to remove all metal jewelry, eyeglasses, removable dental appliances, and any other metal objects on your body. ?What happens during the  test? ? ?You will lie down on an exam table. There will be an X-ray generator below you and an imaging device above you. ?Other devices, such as boxes or braces, may be used to position your body properly for

## 2022-03-04 NOTE — Progress Notes (Signed)
Subjective:   Roberta [name] is a 66 y.o. female who presents for Medicare Annual (Subsequent) preventive examination.  Patient consented to have virtual visit and was identified by name and date of birth. Method of visit: Telephone  Encounter participants: Patient: Roberta [name] - located at Home Nurse/Provider: Steva Colder - located at [facility] Others (if applicable): NA  Review of Systems: Defer to PCP.   Cardiac Risk Factors include: advanced age (>44men, >49 women);hypertension  Objective:   Vitals: There were no vitals taken for this visit.  There is no height or weight on file to calculate BMI.  Advanced Directives 03/04/2022 02/24/2022 02/17/2022 02/03/2022 01/20/2022 05/19/2021 03/10/2021  Does Patient Have a Medical Advance Directive? No No No Yes No No No  Type of Advance Directive - - Web designer;Living will - - Healthcare Power of Attorney  Does patient want to make changes to medical advance directive? - - - - - - -  Copy of Healthcare Power of Attorney in Chart? - - - - - - -  Would patient like information on creating a medical advance directive? Yes (MAU/Ambulatory/Procedural Areas - Information given) No - Patient declined No - Patient declined - No - Patient declined No - Patient declined No - Patient declined   Tobacco Social History   Tobacco Use  Smoking Status Never   Passive exposure: Never  Smokeless Tobacco Never   Clinical Intake:  Pre-visit preparation completed: Yes  How often do you need to have someone help you when you read instructions, pamphlets, or other written materials from your doctor or pharmacy?: 1 - Never What is the last grade level you completed in school?: College  Interpreter Needed?: No  Diabetes: Prediabetic a1c 6.2 02/24/2022  Past Medical History:  Diagnosis Date   Abnormal EKG    nonspecific ST and T-wave changes - Followed by Dr.Berry   ALLERGIC RHINITIS 10/14/2010   Anemia    Asthma    ASTHMA,  INTERMITTENT 02/16/2007   BACK PAIN, CHRONIC 03/25/2009   CERVICAL RADICULOPATHY 10/16/2009   DDD (degenerative disc disease), lumbosacral    Depression    DEPRESSIVE DISORDER NOT ELSEWHERE CLASSIFIED 03/25/2009   Endometrial cancer (HCC)    GERD (gastroesophageal reflux disease) 09/09/2011   Glaucoma    sees optho every 3 months, drops each night   Hyperlipidemia    Hypertension    PONV (postoperative nausea and vomiting)    Postmenopausal vaginal bleeding 09/24/2014   Past Surgical History:  Procedure Laterality Date   ANKLE SURGERY     BIOPSY BREAST     LEFT   BREAST CYST INCISION AND DRAINAGE     under breast   BUNIONECTOMY     bilateral and toe correction   CARPAL TUNNEL RELEASE Bilateral 2009   CATARACT SURGERY     CERVICAL SPINE SURGERY     CHOLECYSTECTOMY  1980   DILATATION & CURRETTAGE/HYSTEROSCOPY WITH RESECTOCOPE N/A 07/28/2015   Procedure: DILATATION & CURETTAGE/HYSTEROSCOPY WITH RESECTOCOPE;  Surgeon: Hal Morales, MD;  Location: [facility];  Service: Gynecology;  Laterality: N/A;   KNEE ARTHROSCOPY     ROBOTIC ASSISTED TOTAL HYSTERECTOMY WITH BILATERAL SALPINGO OOPHERECTOMY Bilateral 09/04/2015   Procedure: ROBOTIC ASSISTED HYSTERECTOMY WITH BILATERAL SALPINGO OOPHORECTOMY SENTINAL NODE MAPPING ;  Surgeon: Adolphus Birchwood, MD;  Location: [facility];  Service: Gynecology;  Laterality: Bilateral;   ROTATOR CUFF REPAIR Right May 2014   Family History  Problem Relation Age of Onset   Lymphoma Mother  related to asbestos   Cancer Mother        lymphoma (asbestos exposure)   Alcoholism Father    Cirrhosis Father        due to alcohol   Asthma Father    Cancer Brother    Cancer Maternal Uncle        lung   Cancer Maternal Uncle        lung   Social History   Socioeconomic History   Marital status: Divorced    Spouse name: Not on file   Number of children: 4   Years of education: 16   Highest education level: Bachelor's degree (e.g., BA, AB, BS)  Occupational  History   Occupation: DISABLED    Employer: UNEMPLOYED   Occupation: Retired    Comment: Arts administrator- troubleshoots  Tobacco Use   Smoking status: Never    Passive exposure: Never   Smokeless tobacco: Never  Vaping Use   Vaping Use: Never used  Substance and Sexual Activity   Alcohol use: No    Alcohol/week: 0.0 standard drinks   Drug use: No   Sexual activity: Not Currently  Other Topics Concern   Not on file  Social History Narrative   Patient lives alone in [place].   Patient is very close with her 4 grown children and her grandchildren.   Patient enjoys puzzles, building Lego cities, crafts, knitting blankets, and bible studies at home.    Patient doesn't eat pork and eats very little red meat          Social Determinants of Health   Financial Resource Strain: Low Risk    Difficulty of Paying Living Expenses: Not hard at all  Food Insecurity: No Food Insecurity   Worried About Programme researcher, broadcasting/film/video in the Last Year: Never true   Ran Out of Food in the Last Year: Never true  Transportation Needs: No Transportation Needs   Lack of Transportation (Medical): No   Lack of Transportation (Non-Medical): No  Physical Activity: Inactive   Days of Exercise per Week: 0 days   Minutes of Exercise per Session: 0 min  Stress: Stress Concern Present   Feeling of Stress : To some extent  Social Connections: Moderately Isolated   Frequency of Communication with Friends and Family: More than three times a week   Frequency of Social Gatherings with Friends and Family: More than three times a week   Attends Religious Services: More than 4 times per year   Active Member of [employer] or Organizations: No   Attends Banker Meetings: Never   Marital Status: Divorced   Outpatient Encounter Medications as of 03/04/2022  Medication Sig   albuterol (PROAIR HFA) 108 (90 Base) MCG/ACT inhaler Inhale 2 puffs into the lungs every 4 (four) hours as needed.   albuterol (PROVENTIL) (2.5  MG/3ML) 0.083% nebulizer solution Take 3 mLs (2.5 mg total) by nebulization every 6 (six) hours as needed for wheezing or shortness of breath.   Ascorbic Acid (VITAMIN C PO) Take by mouth.   cyclobenzaprine (FLEXERIL) 10 MG tablet Take 10 mg daily as needed by mouth for muscle spasms.   dorzolamide-timolol (COSOPT) 22.3-6.8 MG/ML ophthalmic solution Place 1 drop into both eyes 2 (two) times daily.   esomeprazole (NEXIUM) 20 MG capsule TAKE 1 CAPSULE(20 MG) BY MOUTH DAILY AS NEEDED FOR STOMACH ACID OR REFLUX   hydrochlorothiazide (HYDRODIURIL) 12.5 MG tablet Take 1 tablet (12.5 mg total) by mouth daily.   lisinopril (ZESTRIL) 30 MG tablet  Take 1 tablet (30 mg total) by mouth at bedtime.   meclizine (ANTIVERT) 12.5 MG tablet Take 1 tablet (12.5 mg total) by mouth 3 (three) times daily as needed.   Multiple Vitamins-Minerals (HAIR SKIN AND NAILS FORMULA PO) Take 1 tablet 3 (three) times a week by mouth.    ondansetron (ZOFRAN) 4 MG tablet TAKE 1 TABLET BY MOUTH EVERY 8 HOURS AS NEEDED FOR NAUSEA AND VOMITING   traMADol (ULTRAM) 50 MG tablet Take 50 mg daily as needed by mouth for moderate pain.   Travoprost, BAK Free, (TRAVATAN) 0.004 % SOLN ophthalmic solution Place 1 drop into both eyes at bedtime.   cetirizine (ZYRTEC) 10 MG tablet Take 1 tablet (10 mg total) by mouth daily.   diclofenac Sodium (VOLTAREN) 1 % GEL Apply 2 g topically 4 (four) times daily. (Patient not taking: Reported on 10/19/2021)   LORazepam (ATIVAN) 0.5 MG tablet Take 1 tablet (0.5 mg total) by mouth daily as needed for anxiety (airplane ride). (Patient not taking: Reported on 10/19/2021)   Semaglutide,0.25 or 0.5MG /DOS, (OZEMPIC, 0.25 OR 0.5 MG/DOSE,) 2 MG/1.5ML SOPN Inject 0.25 mg into the skin once a week. (Patient not taking: Reported on 03/04/2022)   No facility-administered encounter medications on file as of 03/04/2022.   Activities of Daily Living In your present state of health, do you have any difficulty performing the  following activities: 03/04/2022  Hearing? N  Vision? N  Difficulty concentrating or making decisions? N  Walking or climbing stairs? Y  Dressing or bathing? N  Preparing Food and eating ? N  Using the Toilet? N  In the past six months, have you accidently leaked urine? N  Do you have problems with loss of bowel control? N  Managing your Medications? N  Managing your Finances? N  Housekeeping or managing your Housekeeping? N  Some recent data might be hidden   Patient Care Team: Lavonda Jumbo, DO as PCP - General (Family Medicine) Jethro Bolus, MD (Ophthalmology) Aquilla Hacker, PA-C as Physician Assistant (Otolaryngology) Charlott Rakes, MD as Consulting Physician (Gastroenterology)    Assessment:   This is a routine wellness examination for Ebelia.  Exercise Activities and Dietary recommendations Current Exercise Habits: The patient does not participate in regular exercise at present, Exercise limited by: orthopedic condition(s);cardiac condition(s)   Goals      Exercise 3x per week (30 min per time)       Fall Risk Fall Risk  03/04/2022 02/24/2022 02/17/2022 01/20/2022 05/19/2021  Falls in the past year? 0 0 0 0 0  Number falls in past yr: 0 0 0 0 0  Injury with Fall? 0 0 0 0 0  Risk for fall due to : Orthopedic patient - - - -  Follow up Falls prevention discussed - - - -   Patient reports normal gait and balance. Patient does report using a cain occasionally due to orthopedic conditions.   Is the patient's home free of loose throw rugs in walkways, pet beds, electrical cords, etc?   yes      Grab bars in the bathroom? yes      Handrails on the stairs?   yes      Adequate lighting?   yes  Patient rating of health (0-10) scale: 8  Depression Screen PHQ 2/9 Scores 03/04/2022 02/24/2022 02/17/2022 01/20/2022  PHQ - 2 Score 0 0 0 0  PHQ- 9 Score 0 0 0 0    Cognitive Function MMSE - Mini Mental State Exam 04/24/2014  Orientation  to time 5  Orientation to Place 5   Registration 3  Attention/ Calculation 4  Recall 3  Language- name 2 objects 2  Language- repeat 1  Language- follow 3 step command 3  Language- read & follow direction 1  Write a sentence 1  Copy design 1  Total score 29   6CIT Screen 03/04/2022 10/23/2020  What Year? 0 points 0 points  What month? 0 points 0 points  What time? 0 points 0 points  Count back from 20 0 points 0 points  Months in reverse 0 points 0 points  Repeat phrase 0 points 0 points  Total Score 0 0   Immunization History  Administered Date(s) Administered   Influenza Whole 10/30/2007, 10/02/2008, 10/05/2010   MMR 04/19/1959, 04/22/1957, 04/29/1957   PFIZER Comirnaty(Gray Top)Covid-19 Tri-Sucrose Vaccine 05/19/2021   PFIZER(Purple Top)SARS-COV-2 Vaccination 11/24/2020, 12/15/2020   Td 12/20/2000   Tdap 09/19/2017, 04/09/2019   Varicella 04/19/1959, 04/22/1957, 04/29/1957   Qualifies for Shingles Vaccine? Yes   Shingrix Completed: No, Education has been provided regarding the importance of this vaccine. Advised may receive this vaccine at local pharmacy or Health Dept. Aware to provide a copy of the vaccination record if obtained from local pharmacy or Health Dept. Verbalized acceptance and understanding.  Qualifies for PNA Vaccine? Yes   PNA Completed: No, Education has been provided regarding the importance of this vaccine. Advised may receive this vaccine at local pharmacy or Health Dept. Aware to provide a copy of the vaccination record if obtained from local pharmacy or Health Dept. Verbalized acceptance and understanding.  Screening Tests Health Maintenance  Topic Date Due   Zoster Vaccines- Shingrix (1 of 2) Never done   Pneumonia Vaccine 1+ Years old (1 - PCV) Never done   DEXA SCAN  Never done   COVID-19 Vaccine (4 - Booster for Pfizer series) 07/14/2021   MAMMOGRAM  01/28/2023   COLONOSCOPY (Pts 45-52yrs Insurance coverage will need to be confirmed)  01/20/2025   TETANUS/TDAP  04/08/2029    Hepatitis C Screening  Completed   HPV VACCINES  Aged Out   Cancer Screenings: Lung: Low Dose CT Chest recommended if Age 31-80 years, 20 pack-year currently smoking OR have quit w/in 15years. Patient does not qualify. Breast:  Up to date on Mammogram? Yes   Up to date of Bone Density/Dexa? No- Order has been placed. Colorectal: UTD 01/20/2022  Additional Screenings: Hepatitis C Screening: Completed   Plan:  Will discuss with PCP about next apt and call you. DEXA scan (bone density test) has been ordered. Get this done with your next mammogram.  Shingles vaccine can be given at your local pharmacy.   I have personally reviewed and noted the following in the patients chart:   Medical and social history Use of alcohol, tobacco or illicit drugs  Current medications and supplements Functional ability and status Nutritional status Physical activity Advanced directives List of other physicians Hospitalizations, surgeries, and ER visits in previous 12 months Vitals Screenings to include cognitive, depression, and falls Referrals and appointments  In addition, I have reviewed and discussed with patient certain preventive protocols, quality metrics, and best practice recommendations. A written personalized care plan for preventive services as well as general preventive health recommendations were provided to patient.  This visit was conducted virtually in the setting of the COVID19 pandemic.    Steva Colder, CMA  03/04/2022

## 2022-03-04 NOTE — Progress Notes (Signed)
I have reviewed this visit and agree with the documentation.   

## 2022-03-12 DIAGNOSIS — M79672 Pain in left foot: Secondary | ICD-10-CM | POA: Diagnosis not present

## 2022-03-16 ENCOUNTER — Ambulatory Visit
Admission: RE | Admit: 2022-03-16 | Discharge: 2022-03-16 | Disposition: A | Discharge status: Home / Self Care | Payer: Medicare Other | Source: Ambulatory Visit | Attending: Physician Assistant | Admitting: Physician Assistant

## 2022-03-16 DIAGNOSIS — K115 Sialolithiasis: Secondary | ICD-10-CM

## 2022-03-16 DIAGNOSIS — M47812 Spondylosis without myelopathy or radiculopathy, cervical region: Secondary | ICD-10-CM | POA: Diagnosis not present

## 2022-03-16 MED ORDER — IOPAMIDOL (ISOVUE-300) INJECTION 61%
75.0000 mL | Freq: Once | INTRAVENOUS | Status: AC | PRN
  Administered 2022-03-16: 75 mL via INTRAVENOUS

## 2022-03-22 DIAGNOSIS — H2513 Age-related nuclear cataract, bilateral: Secondary | ICD-10-CM | POA: Diagnosis not present

## 2022-03-23 DIAGNOSIS — M67441 Ganglion, right hand: Secondary | ICD-10-CM | POA: Diagnosis not present

## 2022-03-23 DIAGNOSIS — M19041 Primary osteoarthritis, right hand: Secondary | ICD-10-CM | POA: Diagnosis not present

## 2022-04-01 DIAGNOSIS — K115 Sialolithiasis: Secondary | ICD-10-CM | POA: Diagnosis not present

## 2022-04-07 ENCOUNTER — Encounter: Payer: Self-pay | Admitting: Family Medicine

## 2022-04-07 ENCOUNTER — Ambulatory Visit (INDEPENDENT_AMBULATORY_CARE_PROVIDER_SITE_OTHER): Payer: Medicare Other | Admitting: Family Medicine

## 2022-04-07 VITALS — BP 144/88 | HR 79 | Wt 250.6 lb

## 2022-04-07 DIAGNOSIS — J069 Acute upper respiratory infection, unspecified: Secondary | ICD-10-CM | POA: Diagnosis not present

## 2022-04-07 MED ORDER — BENZONATATE 100 MG PO CAPS: 100.0000 mg | ORAL_CAPSULE | Freq: Two times a day (BID) | ORAL | 0 refills | Status: DC | PRN

## 2022-04-07 NOTE — Progress Notes (Signed)
    SUBJECTIVE:   CHIEF COMPLAINT / HPI:   Cough and congestion Patient presents for cough, congestion, runny nose, sore throat which started last Friday.  She reports that her grandson was sick approximately 1 week ago with similar symptoms and her daughter is also had similar symptoms.  Her daughter reports that she has the flu.  She reports that she feels her symptoms have gotten better over the weekend but that she still has a cough that affects her sleep.  She has been drinking hot tea with honey to help with the sore throat.  She reports that she is going to get a COVID test when she leaves the clinic from her drugstore.  OBJECTIVE:   BP (!) 144/88   Pulse 79   Wt 250 lb 9.6 oz (113.7 kg)   SpO2 98%   BMI 40.45 kg/m   General: Pleasant 66 year old female in no acute distress HEENT: Moist mucous membranes, mild rhinorrhea, erythema of nasal turbinates, posterior oropharynx erythema Cardiac: Regular rate and rhythm Respiratory: Normal work of breathing, lungs clear to auscultation bilaterally Abdomen: Soft, nontender  ASSESSMENT/PLAN:   Viral URI with cough Signs and symptoms consistent with viral URI.  Discussed supportive care measures such as hot tea with honey for the sore throat, Tylenol for fever or discomfort.  Prescribed Tessalon Perles to help with the cough.  Discussed that she needs to keep these out of reach of children.  She is going to take a COVID test at home.  Discussed strict return precautions.  Follow-up as needed.     Gifford Shave, MD [place]

## 2022-04-07 NOTE — Patient Instructions (Signed)
It was good seeing you today.  Your symptoms are consistent with some kind of viral upper respiratory infection.  Treatment for this is symptomatic such as hot tea with honey for the sore throat and cough, drinking plenty of fluids, Tylenol for fever or pain.  I have sent a prescription for Tessalon Perles to help with the cough.  Please be sure to keep these out of reach from your grandson.  I also recommend you take your Zyrtec daily because it appears that you may also have some allergies.  If you have any worsening symptoms such as shortness of breath, chest pain please be evaluated immediately.  I hope you have a wonderful day! ?

## 2022-04-08 NOTE — Assessment & Plan Note (Signed)
Signs and symptoms consistent with viral URI.  Discussed supportive care measures such as hot tea with honey for the sore throat, Tylenol for fever or discomfort.  Prescribed Tessalon Perles to help with the cough.  Discussed that she needs to keep these out of reach of children.  She is going to take a COVID test at home.  Discussed strict return precautions.  Follow-up as needed. ?

## 2022-04-14 ENCOUNTER — Encounter: Payer: Self-pay | Admitting: Student

## 2022-04-14 ENCOUNTER — Ambulatory Visit (INDEPENDENT_AMBULATORY_CARE_PROVIDER_SITE_OTHER): Payer: Medicare Other | Admitting: Student

## 2022-04-14 VITALS — BP 130/78 | HR 80 | Wt 248.0 lb

## 2022-04-14 DIAGNOSIS — J302 Other seasonal allergic rhinitis: Secondary | ICD-10-CM | POA: Diagnosis not present

## 2022-04-14 DIAGNOSIS — R051 Acute cough: Secondary | ICD-10-CM

## 2022-04-14 MED ORDER — CETIRIZINE HCL 10 MG PO TABS: 10.0000 mg | ORAL_TABLET | Freq: Every day | ORAL | 1 refills | Status: DC

## 2022-04-14 NOTE — Progress Notes (Signed)
? ? ?  SUBJECTIVE:  ? ?CHIEF COMPLAINT / HPI:  ? ?Cough  ?Patient was seen here on 4/19 for cough, sore throat, congestion and rhinorrhea, had a negative COVID test at home.  At that visit she was diagnosed with viral URI and prescribed Tessalon Perles to help with the cough at that visit but did not take the tessalon perles d/t reading about side effects.  ? ?Has now had cough for about 3 weeks, sore throat is worse with eating and coughing. Denies any fevers for past 2 weeks, had a tactile fever about 3 weeks ago.  ?Does have seasonal allergies and asthma. Is also having tickling in her throat and sounding horse. Used to take zyrtec which . Uses her albuterol as needed which is infrequent and is typically only required when her allergies are very bad. ? ?PERTINENT  PMH / PSH: Asthma, seasonal allergies ? ?OBJECTIVE:  ? ?BP 130/78   Pulse 80   Wt 248 lb (112.5 kg)   SpO2 98%   BMI 40.03 kg/m?   ? ?General: NAD, pleasant, able to participate in exam ?Cardiac: RRR, no murmurs. ?Respiratry: CTAB, normal effort, No wheezes, rales or rhonchi ?Extremities: no edema or cyanosis. ?Neuro: alert, no obvious focal deficits ?Psych: Normal affect and mood ? ?ASSESSMENT/PLAN:  ? ?Acute cough ?Cough could be multifactorial.  Could be lingering cough due to viral URI a couple weeks ago and could also be related to seasonal allergies as patient reports history of seasonal allergies but is not currently on any allergy medication.  Patient cannot take Flonase due to past reaction but agrees to try Zyrtec.  As patient also has asthma, can consider adding Singulair in the future if indicated. ?-Zyrtec 10 mg daily ?-Return for further evaluation if cough persists beyond 2 to 3 more weeks and no improvement after initiating Zyrtec ?  ?-Patient was offered pneumococcal vaccine today but declined  ? ?Dr. Precious Gilding, DO ?[place]  ? ? ? ? ?

## 2022-04-14 NOTE — Assessment & Plan Note (Signed)
Cough could be multifactorial.  Could be lingering cough due to viral URI a couple weeks ago and could also be related to seasonal allergies as patient reports history of seasonal allergies but is not currently on any allergy medication.  Patient cannot take Flonase due to past reaction but agrees to try Zyrtec.  As patient also has asthma, can consider adding Singulair in the future if indicated. ?-Zyrtec 10 mg daily ?-Return for further evaluation if cough persists beyond 2 to 3 more weeks and no improvement after initiating Zyrtec ?

## 2022-04-14 NOTE — Patient Instructions (Signed)
It was great to see you! Thank you for allowing me to participate in your care! ? ?I recommend that you always bring your medications to each appointment as this makes it easy to ensure you are on the correct medications and helps [place] not miss when refills are needed. ? ?Our plans for today:  ?- I sent in a prescription for Zyrtec to your pharmacy.  I recommend taking this medication every day during allergy season ?-If your cough does not improve over the next 2 to 3 weeks recommend returning for further evaluation ? ? ?Take care and seek immediate care sooner if you develop any concerns.  ? ?Dr. Precious Gilding, DO ?[facility] ? ?

## 2022-04-20 ENCOUNTER — Ambulatory Visit (INDEPENDENT_AMBULATORY_CARE_PROVIDER_SITE_OTHER): Payer: Medicare Other | Admitting: Family Medicine

## 2022-04-20 ENCOUNTER — Encounter: Payer: Self-pay | Admitting: Family Medicine

## 2022-04-20 ENCOUNTER — Other Ambulatory Visit: Payer: Self-pay

## 2022-04-20 VITALS — BP 145/84 | HR 83 | Wt 248.2 lb

## 2022-04-20 DIAGNOSIS — F419 Anxiety disorder, unspecified: Secondary | ICD-10-CM | POA: Diagnosis not present

## 2022-04-20 DIAGNOSIS — F32A Depression, unspecified: Secondary | ICD-10-CM | POA: Diagnosis not present

## 2022-04-20 DIAGNOSIS — G479 Sleep disorder, unspecified: Secondary | ICD-10-CM | POA: Diagnosis not present

## 2022-04-20 DIAGNOSIS — M19041 Primary osteoarthritis, right hand: Secondary | ICD-10-CM | POA: Diagnosis not present

## 2022-04-20 DIAGNOSIS — M67441 Ganglion, right hand: Secondary | ICD-10-CM | POA: Diagnosis not present

## 2022-04-20 MED ORDER — MELATONIN 1 MG PO TABS: 1.0000 mg | ORAL_TABLET | Freq: Every day | ORAL | 0 refills | Status: DC

## 2022-04-20 NOTE — Patient Instructions (Addendum)
Follow up in 1 week. You can take melatonin 1-2 mg tab at bedtime.  ? ?Medicare should contact their insurance company to get a list of in network providers. ? ?Dr. Janus Molder ? ?

## 2022-04-20 NOTE — Progress Notes (Signed)
? ? ?  SUBJECTIVE:  ? ?CHIEF COMPLAINT / HPI:  ? ?Anxious mood, follow up ?Experiencing a lot of life stressors such as possible eviction, family member death, and issues with sleeping. She has reached out to her sister who is helping her but she states her children to not want to have anything to do with her. She states she has not sought out therapy since our last visit because things were looking up then. She denies SI but states she just wants to runaway from her problems. ? ?Trouble sleeping ?This is issue began with the increased life stressors as discussed above. Able to get to sleep but only sleeps a few hours before she is back up. She admits to having increased anxiety surrounding the above issues.  ? ?PERTINENT  PMH / PSH: As abaove. ? ?OBJECTIVE:  ? ?BP (!) 145/84   Pulse 83   Wt 248 lb 3.2 oz (112.6 kg)   SpO2 99%   BMI 40.06 kg/m?   ? ?  04/20/2022  ?  4:35 PM 04/14/2022  ?  1:54 PM 03/04/2022  ?  2:34 PM  ?PHQ9 SCORE ONLY  ?PHQ-9 Total Score 3 0 0  ?General: No acute distress. Age appropriate. ?Respiratory: normal effort ?Skin: Warm and dry, no rashes noted ?Neuro: alert and oriented ?Psych: very tearful during encounter ? ?ASSESSMENT/PLAN:  ? ?Anxiety and depression ?Hx of depression but today symptoms appear to be more anxious surrounding currently living situation. Was told she was behind on her rent and feels as though she is being bullied to pay more money. She is in the mist of getting legal aid and taking action. She has her sister for support but is emotional when discussing her son's response of stating he has his own problems. When asked how can I help as her provider she stated I do not know. She denies SI but wants to runaway from her current problems. She was given resources for therapy our last OV in which she did not seek help at the time because things started to look up. Encouraged her to review the list and also call her insurance to get in network therapy that is covered. She states  she has both medicare and medicaid. Will follow up in 1 week. Obtain MDQ and GAD7 at follow up.  ? ?Sleeping difficulties ?Since our last OV has developed issues with staying asleep due to increased life stressors. We discussed sleep hygiene and writing down thoughts and to dos before bed to clear the mind. Can take 1-2 mg of melatonin at night to help with sleep.  ? ?Connee Ikner Autry-Lott, DO ?[place]  ?

## 2022-05-03 ENCOUNTER — Encounter: Payer: Self-pay | Admitting: Family Medicine

## 2022-05-03 DIAGNOSIS — J45909 Unspecified asthma, uncomplicated: Secondary | ICD-10-CM

## 2022-05-04 MED ORDER — ALBUTEROL SULFATE HFA 108 (90 BASE) MCG/ACT IN AERS: 2.0000 | INHALATION_SPRAY | RESPIRATORY_TRACT | 3 refills | Status: DC | PRN

## 2022-05-25 ENCOUNTER — Encounter: Payer: Self-pay | Admitting: *Deleted

## 2022-06-09 ENCOUNTER — Encounter: Payer: Self-pay | Admitting: Family Medicine

## 2022-07-14 ENCOUNTER — Other Ambulatory Visit: Payer: Self-pay | Admitting: Family Medicine

## 2022-07-16 ENCOUNTER — Other Ambulatory Visit: Payer: Self-pay | Admitting: Family Medicine

## 2022-07-16 DIAGNOSIS — K219 Gastro-esophageal reflux disease without esophagitis: Secondary | ICD-10-CM

## 2022-07-23 ENCOUNTER — Emergency Department (HOSPITAL_COMMUNITY)
Admission: EM | Admit: 2022-07-23 | Discharge: 2022-07-23 | Disposition: A | Discharge status: Home / Self Care | Payer: Medicare Other | Attending: Emergency Medicine | Admitting: Emergency Medicine

## 2022-07-23 ENCOUNTER — Encounter (HOSPITAL_COMMUNITY): Payer: Self-pay

## 2022-07-23 ENCOUNTER — Emergency Department (HOSPITAL_BASED_OUTPATIENT_CLINIC_OR_DEPARTMENT_OTHER): Payer: Medicare Other

## 2022-07-23 ENCOUNTER — Other Ambulatory Visit: Payer: Self-pay

## 2022-07-23 DIAGNOSIS — M79662 Pain in left lower leg: Secondary | ICD-10-CM | POA: Diagnosis not present

## 2022-07-23 DIAGNOSIS — I82402 Acute embolism and thrombosis of unspecified deep veins of left lower extremity: Secondary | ICD-10-CM | POA: Diagnosis not present

## 2022-07-23 DIAGNOSIS — M79605 Pain in left leg: Secondary | ICD-10-CM

## 2022-07-23 DIAGNOSIS — Z7901 Long term (current) use of anticoagulants: Secondary | ICD-10-CM | POA: Diagnosis not present

## 2022-07-23 DIAGNOSIS — I82432 Acute embolism and thrombosis of left popliteal vein: Secondary | ICD-10-CM | POA: Diagnosis not present

## 2022-07-23 LAB — BASIC METABOLIC PANEL
Anion gap: 5 (ref 5–15)
BUN: 16 mg/dL (ref 8–23)
CO2: 26 mmol/L (ref 22–32)
Calcium: 9.4 mg/dL (ref 8.9–10.3)
Chloride: 108 mmol/L (ref 98–111)
Creatinine, Ser: 0.98 mg/dL (ref 0.44–1.00)
GFR, Estimated: >60 mL/min (ref >60)
Glucose, Bld: 95 mg/dL (ref 70–99)
Potassium: 4.1 mmol/L (ref 3.5–5.1)
Sodium: 139 mmol/L (ref 135–145)

## 2022-07-23 LAB — CBC
HCT: 44.2 % (ref 36.0–46.0)
Hemoglobin: 13.1 g/dL (ref 12.0–15.0)
MCH: 25.6 pg — ABNORMAL LOW (ref 26.0–34.0)
MCHC: 29.6 g/dL — ABNORMAL LOW (ref 30.0–36.0)
MCV: 86.3 fL (ref 80.0–100.0)
Platelets: 280 10*3/uL (ref 150–400)
RBC: 5.12 MIL/uL — ABNORMAL HIGH (ref 3.87–5.11)
RDW: 14.8 % (ref 11.5–15.5)
WBC: 7.3 10*3/uL (ref 4.0–10.5)
nRBC: 0 % (ref 0.0–0.2)

## 2022-07-23 MED ORDER — APIXABAN (ELIQUIS) VTE STARTER PACK (10MG AND 5MG)
ORAL_TABLET | ORAL | 0 refills | Status: DC
  Filled 2022-07-23: qty 74, 30d supply, fill #0

## 2022-07-23 MED ORDER — APIXABAN (ELIQUIS) VTE STARTER PACK (10MG AND 5MG): ORAL_TABLET | ORAL | 0 refills | Status: DC

## 2022-07-23 MED ORDER — APIXABAN 5 MG PO TABS
10.0000 mg | ORAL_TABLET | Freq: Once | ORAL | Status: AC
  Administered 2022-07-23: 10 mg via ORAL
  Filled 2022-07-23: qty 2

## 2022-07-23 NOTE — ED Triage Notes (Signed)
Pt reports she was sent by Urgent Care for left posterior thigh and calf cramping. Pt reports driving 6 hr round trip on Monday without stopping and the next day she developed left calf pain. Denies taking blood thinners. Ambulatory with cane. Denies CP or SOB.

## 2022-07-23 NOTE — Progress Notes (Signed)
Lower extremity venous left study completed.  Preliminary results relayed to [place], [place] in triage upon completion of exam.  See CV Proc for preliminary results report.   Darlin Coco, RDMS, RVT

## 2022-07-23 NOTE — ED Provider Notes (Signed)
[facility] DEPT Provider Note   CSN: [id number] Arrival date & time: 07/23/22  1747     History  Chief Complaint  Patient presents with   Leg Pain    Roberta [name] is a 66 y.o. female.  Patient is a 66 year old female presenting for left lower leg pain.  Patient admits to sitting in a car for 3 hours with the car seat causing pressure in her left knee.  States she then went on to sit on a low couch for several hours.  Admits to severe pain behind the left knee that is worsening.  Admits to left lower leg swelling.  Prior history of superficial DVT not currently on blood thinners.  Denies any chest pain or shortness of breath.  The history is provided by the patient. No language interpreter was used.  Leg Pain Associated symptoms: no back pain and no fever        Home Medications Prior to Admission medications   Medication Sig Start Date End Date Taking? Authorizing Provider  albuterol (PROAIR HFA) 108 (90 Base) MCG/ACT inhaler Inhale 2 puffs into the lungs every 4 (four) hours as needed. 05/04/22   Autry-Lott, Naaman Plummer, DO  albuterol (PROVENTIL) (2.5 MG/3ML) 0.083% nebulizer solution Take 3 mLs (2.5 mg total) by nebulization every 6 (six) hours as needed for wheezing or shortness of breath. 04/03/21   Simmons-Robinson, Riki Sheer, MD  APIXABAN Arne [place]) VTE STARTER PACK ('10MG'$  AND '5MG'$ ) Take as directed on package: start with two-'5mg'$  tablets twice daily for 7 days. On day 8, switch to one-'5mg'$  tablet twice daily. 12/28/27   Campbell Stall P, DO  Ascorbic Acid (VITAMIN C PO) Take by mouth.    [provider]  benzonatate (TESSALON) 100 MG capsule Take 1 capsule (100 mg total) by mouth 2 (two) times daily as needed for cough. Patient not taking: Reported on 04/14/2022 04/07/22   Concepcion Living, MD  cetirizine (ZYRTEC) 10 MG tablet Take 1 tablet (10 mg total) by mouth daily. 04/14/22   Precious Gilding, DO  cyclobenzaprine (FLEXERIL) 10 MG tablet Take 10 mg  daily as needed by mouth for muscle spasms.    [provider]  diclofenac Sodium (VOLTAREN) 1 % GEL Apply 2 g topically 4 (four) times daily. Patient not taking: Reported on 10/19/2021 09/01/21   Hans Eden, NP  dorzolamide-timolol (COSOPT) 22.3-6.8 MG/ML ophthalmic solution Place 1 drop into both eyes 2 (two) times daily. 10/05/17   Eloise Levels, MD  esomeprazole (NEXIUM) 20 MG capsule TAKE 1 CAPSULE(20 MG) BY MOUTH DAILY AS NEEDED FOR STOMACH ACID OR REFLUX 07/16/22   Precious Gilding, DO  hydrochlorothiazide (HYDRODIURIL) 12.5 MG tablet TAKE 1 TABLET(12.5 MG) BY MOUTH DAILY AS NEEDED FOR SWELLING 07/15/22   Precious Gilding, DO  lisinopril (ZESTRIL) 30 MG tablet Take 1 tablet (30 mg total) by mouth at bedtime. 01/20/22   Autry-Lott, Naaman Plummer, DO  LORazepam (ATIVAN) 0.5 MG tablet Take 1 tablet (0.5 mg total) by mouth daily as needed for anxiety (airplane ride). Patient not taking: Reported on 10/19/2021 05/19/21   Zola Button, MD  meclizine (ANTIVERT) 12.5 MG tablet Take 1 tablet (12.5 mg total) by mouth 3 (three) times daily as needed. 11/22/20   Autry-Lott, Naaman Plummer, DO  melatonin 1 MG TABS tablet Take 1 tablet (1 mg total) by mouth at bedtime. 04/20/22   Autry-Lott, Naaman Plummer, DO  Multiple Vitamins-Minerals (HAIR SKIN AND NAILS FORMULA PO) Take 1 tablet 3 (three) times a week by mouth.  [provider]  ondansetron (ZOFRAN) 4 MG tablet TAKE 1 TABLET BY MOUTH EVERY 8 HOURS AS NEEDED FOR NAUSEA AND VOMITING 12/08/21   Autry-Lott, Naaman Plummer, DO  Semaglutide,0.25 or 0.'5MG'$ /DOS, (OZEMPIC, 0.25 OR 0.5 MG/DOSE,) 2 MG/1.5ML SOPN Inject 0.25 mg into the skin once a week. Patient not taking: Reported on 03/04/2022 02/25/22   Autry-Lott, Naaman Plummer, DO  traMADol (ULTRAM) 50 MG tablet Take 50 mg daily as needed by mouth for moderate pain.    [provider]  Travoprost, BAK Free, (TRAVATAN) 0.004 % SOLN ophthalmic solution Place 1 drop into both eyes at bedtime. 10/23/14   Bernadene Bell, MD       Allergies    Influenza vaccines, Budesonide, Flonase [fluticasone propionate], Codeine, Morphine and related, and Nickel    Review of Systems   Review of Systems  Constitutional:  Negative for chills and fever.  HENT:  Negative for ear pain and sore throat.   Eyes:  Negative for pain and visual disturbance.  Respiratory:  Negative for cough and shortness of breath.   Cardiovascular:  Positive for leg swelling. Negative for chest pain and palpitations.  Gastrointestinal:  Negative for abdominal pain and vomiting.  Genitourinary:  Negative for dysuria and hematuria.  Musculoskeletal:  Negative for arthralgias and back pain.  Skin:  Negative for color change and rash.  Neurological:  Negative for seizures and syncope.  All other systems reviewed and are negative.   Physical Exam Updated Vital Signs BP 132/87   Pulse 85   Temp 98.2 F (36.8 C) (Oral)   Resp 18   Ht '5\' 6"'$  (1.676 m)   Wt 110.7 kg   SpO2 98%   BMI 39.38 kg/m  Physical Exam Vitals and nursing note reviewed.  Constitutional:      General: She is not in acute distress.    Appearance: She is well-developed.  HENT:     Head: Normocephalic and atraumatic.  Eyes:     Conjunctiva/sclera: Conjunctivae normal.  Cardiovascular:     Rate and Rhythm: Normal rate and regular rhythm.     Heart sounds: No murmur heard. Pulmonary:     Effort: Pulmonary effort is normal. No respiratory distress.     Breath sounds: Normal breath sounds.  Abdominal:     Palpations: Abdomen is soft.     Tenderness: There is no abdominal tenderness.  Musculoskeletal:        General: No swelling.     Cervical back: Neck supple.  Skin:    General: Skin is warm and dry.     Capillary Refill: Capillary refill takes less than 2 seconds.  Neurological:     Mental Status: She is alert.  Psychiatric:        Mood and Affect: Mood normal.     ED Results / Procedures / Treatments   Labs (all labs ordered are listed, but only abnormal  results are displayed) Labs Reviewed  CBC - Abnormal; Notable for the following components:      Result Value   RBC 5.12 (*)    MCH 25.6 (*)    MCHC 29.6 (*)    All other components within normal limits  BASIC METABOLIC PANEL    EKG None  Radiology VAS [place] LOWER EXTREMITY VENOUS (DVT) (7a-7p)  Result Date: 07/23/2022  Lower Venous DVT Study Patient Name:  JERILYNN FELDMEIER  Date of Exam:   07/23/2022 Medical Rec #: [id number]         Accession #:  [id number] Date of Birth: 17-Apr-1956        Patient Gender: F Patient Age:   49 years Exam Location:  [facility] Procedure:      VAS [place] LOWER EXTREMITY VENOUS (DVT) Referring Phys: AMJAD ALI --------------------------------------------------------------------------------  Indications: Pain and swelling of left leg w/ palpable cord/varicosities.  Risk Factors: Recent extended travel. Limitations: Compressions contraindicated distally secondary to more proximal findings. Comparison Study: No prior studies. Performing Technologist: Darlin Coco RDMS, RVT  Examination Guidelines: A complete evaluation includes B-mode imaging, spectral Doppler, color Doppler, and power Doppler as needed of all accessible portions of each vessel. Bilateral testing is considered an integral part of a complete examination. Limited examinations for reoccurring indications may be performed as noted. The reflux portion of the exam is performed with the patient in reverse Trendelenburg.  +-----+---------------+---------+-----------+----------+--------------+ RIGHTCompressibilityPhasicitySpontaneityPropertiesThrombus Aging +-----+---------------+---------+-----------+----------+--------------+ CFV  Full           Yes      Yes                                 +-----+---------------+---------+-----------+----------+--------------+   +---------+---------------+---------+-----------+---------------+--------------+ LEFT      CompressibilityPhasicitySpontaneityProperties     Thrombus Aging +---------+---------------+---------+-----------+---------------+--------------+ CFV      Full           Yes      Yes                                      +---------+---------------+---------+-----------+---------------+--------------+ SFJ      Full                                                             +---------+---------------+---------+-----------+---------------+--------------+ FV Prox  Full                                                             +---------+---------------+---------+-----------+---------------+--------------+ FV Mid   Full                                                             +---------+---------------+---------+-----------+---------------+--------------+ FV DistalFull                                                             +---------+---------------+---------+-----------+---------------+--------------+ PFV      Full                                                             +---------+---------------+---------+-----------+---------------+--------------+  POP      Partial        Yes      Yes        Mobile, poorly Acute                                                      attached                      +---------+---------------+---------+-----------+---------------+--------------+ PTV                     Yes      Yes                                      +---------+---------------+---------+-----------+---------------+--------------+ PERO                    Yes      Yes                                      +---------+---------------+---------+-----------+---------------+--------------+ Gastroc  None           No       No                        Acute          +---------+---------------+---------+-----------+---------------+--------------+ SSV      None           No       No                        Acute           +---------+---------------+---------+-----------+---------------+--------------+     Summary: RIGHT: - No evidence of common femoral vein obstruction.  LEFT: - Findings consistent with acute deep vein thrombosis involving the left popliteal vein, and left gastrocnemius veins. Popliteal vein thrombus observed to be mobile and poorly attached.  - Findings consistent with acute superficial vein thrombosis involving the left small saphenous vein.  - No cystic structure found in the popliteal fossa.  *See table(s) above for measurements and observations. Electronically signed by Deitra Mayo MD on 07/23/2022 at 7:25:28 PM.    Final     Procedures Procedures    Medications Ordered in ED Medications  apixaban (ELIQUIS) tablet 10 mg (has no administration in time range)    ED Course/ Medical Decision Making/ A&P                           Medical Decision Making Risk Prescription drug management.   10:34 PM Patient is a 66 year old female presenting for left lower leg pain.  Patient is alert and oriented x3, no acute distress, afebrile, stable vital signs.  No appreciable swelling of the left lower extremity When compared to the right.  Patient had significant pain in the popliteal region.  Ultrasound of the left lower extremity demonstrates superficial thromboses as well as thrombosis in the popliteal region.  I spoke with vascular surgeon Dr. Scot Dock regarding the mobile-ness of the blood clot of the initial  ultrasound read.  He states this is no indication to admit.  Patient again denies any chest pain or shortness of breath at this time.  Denies any history of brain bleeds or gastric hemorrhages.  Eliquis started in ED and starter pack brought to patient in ED and recommended for close follow-up with vascular surgeon outpatient in the next month.  Patient in no distress and overall condition improved here in the ED. Detailed discussions were had with the patient regarding current findings, and  need for close f/u with PCP or on call doctor. The patient has been instructed to return immediately if the symptoms worsen in any way for re-evaluation. Patient verbalized understanding and is in agreement with current care plan. All questions answered prior to discharge.          Final Clinical Impression(s) / ED Diagnoses Final diagnoses:  Acute deep vein thrombosis (DVT) of popliteal vein of left lower extremity ([place])    Rx / DC Orders ED Discharge Orders          Ordered    APIXABAN (ELIQUIS) VTE STARTER PACK ('10MG'$  AND '5MG'$ )  Status:  Discontinued        07/23/22 2111    APIXABAN (ELIQUIS) VTE STARTER PACK ('10MG'$  AND '5MG'$ )        07/23/22 2114              Campbell Stall P, DO 11/55/20 2234

## 2022-07-23 NOTE — ED Provider Triage Note (Signed)
Emergency Medicine Provider Triage Evaluation Note  Roberta [name] , a 66 y.o. female  was evaluated in triage.  Pt complains of left lower extremity swelling.  Initially noticed this on Tuesday.  Reports she had a recent road trip of about 4 hours each way, and 2 hours in between which she has been sitting.  Did not take break in between.  Denies prior history of blood clots.  Was evaluated at PCP office and referred for further evaluation of DVT.Marland Kitchen  Review of Systems  Positive: As above Negative: As above  Physical Exam  BP (!) 143/82 (BP Location: Right Arm)   Pulse 86   Temp 98.3 F (36.8 C) (Oral)   Resp 18   Ht '5\' 6"'$  (1.676 m)   Wt 110.7 kg   SpO2 95%   BMI 39.38 kg/m  Gen:   Awake, no distress   Resp:  Normal effort  MSK:   Moves extremities without difficulty  Other:    Medical Decision Making  Medically screening exam initiated at 6:19 PM.  Appropriate orders placed.  Roberta [name] was informed that the remainder of the evaluation will be completed by another provider, this initial triage assessment does not replace that evaluation, and the importance of remaining in the ED until their evaluation is complete.    Roberta Courier, PA-C 07/23/22 1820

## 2022-07-24 ENCOUNTER — Other Ambulatory Visit (HOSPITAL_COMMUNITY): Payer: Self-pay

## 2022-07-25 ENCOUNTER — Other Ambulatory Visit: Payer: Self-pay | Admitting: Family Medicine

## 2022-07-25 DIAGNOSIS — R112 Nausea with vomiting, unspecified: Secondary | ICD-10-CM

## 2022-08-01 ENCOUNTER — Encounter (HOSPITAL_COMMUNITY): Payer: Self-pay

## 2022-08-01 ENCOUNTER — Other Ambulatory Visit: Payer: Self-pay

## 2022-08-01 ENCOUNTER — Emergency Department (HOSPITAL_COMMUNITY)
Admission: EM | Admit: 2022-08-01 | Discharge: 2022-08-01 | Disposition: A | Discharge status: Home / Self Care | Payer: Medicare Other | Attending: Emergency Medicine | Admitting: Emergency Medicine

## 2022-08-01 ENCOUNTER — Emergency Department (HOSPITAL_COMMUNITY): Payer: Medicare Other

## 2022-08-01 DIAGNOSIS — M1711 Unilateral primary osteoarthritis, right knee: Secondary | ICD-10-CM | POA: Diagnosis not present

## 2022-08-01 DIAGNOSIS — I10 Essential (primary) hypertension: Secondary | ICD-10-CM | POA: Insufficient documentation

## 2022-08-01 DIAGNOSIS — S8001XA Contusion of right knee, initial encounter: Secondary | ICD-10-CM | POA: Diagnosis not present

## 2022-08-01 DIAGNOSIS — M25561 Pain in right knee: Secondary | ICD-10-CM

## 2022-08-01 DIAGNOSIS — J45909 Unspecified asthma, uncomplicated: Secondary | ICD-10-CM | POA: Insufficient documentation

## 2022-08-01 DIAGNOSIS — W010XXA Fall on same level from slipping, tripping and stumbling without subsequent striking against object, initial encounter: Secondary | ICD-10-CM | POA: Insufficient documentation

## 2022-08-01 DIAGNOSIS — S8991XA Unspecified injury of right lower leg, initial encounter: Secondary | ICD-10-CM | POA: Diagnosis present

## 2022-08-01 NOTE — ED Triage Notes (Signed)
Pt c/o right knee pain s/p mechanical fall yesterday. Pt denies hitting head or loc. Currently on Xarelto

## 2022-08-01 NOTE — ED Provider Notes (Addendum)
[facility] DEPT Provider Note   CSN: [id number] Arrival date & time: 08/01/22  0941     History  Chief Complaint  Patient presents with   Knee Pain    Roberta [name] is a 66 y.o. female.  Patient presents to the hospital complaining of right knee pain after a fall.  Patient states she was walking through her house last night when she slipped and tripped over one of her grandchildren's toys.  She states that she landed with her arm stretched out and hit her right knee.  She states that she had a few small abrasions but that she has pain on the medial side of her right knee.  The patient was recently diagnosed with a DVT on the left side and is currently on Eliquis.  She denies hitting her head or losing consciousness.  Other past medical history is significant for chronic back pain, degenerative disc disease, GERD, asthma, hypertension  HPI     Home Medications Prior to Admission medications   Medication Sig Start Date End Date Taking? Authorizing Provider  albuterol (PROAIR HFA) 108 (90 Base) MCG/ACT inhaler Inhale 2 puffs into the lungs every 4 (four) hours as needed. 05/04/22   Autry-Lott, Naaman Plummer, DO  albuterol (PROVENTIL) (2.5 MG/3ML) 0.083% nebulizer solution Take 3 mLs (2.5 mg total) by nebulization every 6 (six) hours as needed for wheezing or shortness of breath. 04/03/21   Simmons-Robinson, Riki Sheer, MD  APIXABAN Arne [place]) VTE STARTER PACK ('10MG'$  AND '5MG'$ ) Take as directed on package: start with two-'5mg'$  tablets twice daily for 7 days. On day 8, switch to one-'5mg'$  tablet twice daily. 12/23/76   Campbell Stall P, DO  Ascorbic Acid (VITAMIN C PO) Take by mouth.    [provider]  benzonatate (TESSALON) 100 MG capsule Take 1 capsule (100 mg total) by mouth 2 (two) times daily as needed for cough. Patient not taking: Reported on 04/14/2022 04/07/22   Concepcion Living, MD  cetirizine (ZYRTEC) 10 MG tablet Take 1 tablet (10 mg total) by mouth daily.  04/14/22   Precious Gilding, DO  cyclobenzaprine (FLEXERIL) 10 MG tablet Take 10 mg daily as needed by mouth for muscle spasms.    [provider]  diclofenac Sodium (VOLTAREN) 1 % GEL Apply 2 g topically 4 (four) times daily. Patient not taking: Reported on 10/19/2021 09/01/21   Hans Eden, NP  dorzolamide-timolol (COSOPT) 22.3-6.8 MG/ML ophthalmic solution Place 1 drop into both eyes 2 (two) times daily. 10/05/17   Eloise Levels, MD  esomeprazole (NEXIUM) 20 MG capsule TAKE 1 CAPSULE(20 MG) BY MOUTH DAILY AS NEEDED FOR STOMACH ACID OR REFLUX 07/16/22   Precious Gilding, DO  hydrochlorothiazide (HYDRODIURIL) 12.5 MG tablet TAKE 1 TABLET(12.5 MG) BY MOUTH DAILY AS NEEDED FOR SWELLING 07/15/22   Precious Gilding, DO  lisinopril (ZESTRIL) 30 MG tablet Take 1 tablet (30 mg total) by mouth at bedtime. 01/20/22   Autry-Lott, Naaman Plummer, DO  LORazepam (ATIVAN) 0.5 MG tablet Take 1 tablet (0.5 mg total) by mouth daily as needed for anxiety (airplane ride). Patient not taking: Reported on 10/19/2021 05/19/21   Zola Button, MD  meclizine (ANTIVERT) 12.5 MG tablet Take 1 tablet (12.5 mg total) by mouth 3 (three) times daily as needed. 11/22/20   Autry-Lott, Naaman Plummer, DO  melatonin 1 MG TABS tablet Take 1 tablet (1 mg total) by mouth at bedtime. 04/20/22   Autry-Lott, Naaman Plummer, DO  Multiple Vitamins-Minerals (HAIR SKIN AND NAILS FORMULA PO) Take 1 tablet 3 (three)  times a week by mouth.     [provider]  ondansetron (ZOFRAN) 4 MG tablet TAKE 1 TABLET BY MOUTH EVERY 8 HOURS AS NEEDED FOR NAUSEA OR VOMITING 07/26/22   Precious Gilding, DO  Semaglutide,0.25 or 0.'5MG'$ /DOS, (OZEMPIC, 0.25 OR 0.5 MG/DOSE,) 2 MG/1.5ML SOPN Inject 0.25 mg into the skin once a week. Patient not taking: Reported on 03/04/2022 02/25/22   Autry-Lott, Naaman Plummer, DO  traMADol (ULTRAM) 50 MG tablet Take 50 mg daily as needed by mouth for moderate pain.    [provider]  Travoprost, BAK Free, (TRAVATAN) 0.004 % SOLN ophthalmic solution Place  1 drop into both eyes at bedtime. 10/23/14   Bernadene Bell, MD      Allergies    Influenza vaccines, Budesonide, Flonase [fluticasone propionate], Codeine, Morphine and related, and Nickel    Review of Systems   Review of Systems  Respiratory:  Negative for shortness of breath.   Musculoskeletal:  Positive for arthralgias.  Skin:  Positive for wound.  Neurological:  Negative for headaches.    Physical Exam Updated Vital Signs BP (!) 146/90 (BP Location: Right Arm)   Pulse 88   Temp 98.3 F (36.8 C) (Oral)   Resp 18   Ht '5\' 6"'$  (1.676 m)   Wt 110.6 kg   SpO2 99%   BMI 39.35 kg/m  Physical Exam Vitals and nursing note reviewed.  Constitutional:      General: She is not in acute distress. HENT:     Head: Normocephalic and atraumatic.     Mouth/Throat:     Mouth: Mucous membranes are moist.  Eyes:     Extraocular Movements: Extraocular movements intact.     Conjunctiva/sclera: Conjunctivae normal.  Cardiovascular:     Rate and Rhythm: Normal rate.  Pulmonary:     Effort: Pulmonary effort is normal.  Musculoskeletal:        General: Swelling (Minimal swelling in the medial portion of the right knee) and tenderness (Tenderness to palpation of the anterior medial portion of the right knee.) present. Normal range of motion.     Cervical back: Normal range of motion and neck supple.  Skin:    Capillary Refill: Capillary refill takes less than 2 seconds.     Findings: Bruising (Anteromedial right knee) present.  Neurological:     Mental Status: She is alert and oriented to person, place, and time.  Psychiatric:        Behavior: Behavior normal.     ED Results / Procedures / Treatments   Labs (all labs ordered are listed, but only abnormal results are displayed) Labs Reviewed - No data to display  EKG None  Radiology DG Knee Complete 4 Views Right  Result Date: 08/01/2022 CLINICAL DATA:  Pain after fall EXAM: RIGHT KNEE - COMPLETE 4+ VIEW COMPARISON:  None  Available. FINDINGS: Tricompartmental degenerative changes.  No fracture or effusion. IMPRESSION: No fracture or effusion.  Tricompartmental degenerative changes. Electronically Signed   By: Dorise Bullion III M.D.   On: 08/01/2022 10:56    Procedures Procedures    Medications Ordered in ED Medications - No data to display  ED Course/ Medical Decision Making/ A&P                           Medical Decision Making Amount and/or Complexity of Data Reviewed Radiology: ordered.   This patient presents to the ED for concern of right knee pain, this involves an extensive  number of treatment options, and is a complaint that carries with it a high risk of complications and morbidity.  The differential diagnosis includes fracture, dislocation, hematoma, joint effusion, soft tissue injury, others   Co morbidities that complicate the patient evaluation  Currently on Eliquis   Additional history obtained:   External records from outside source obtained and reviewed including notes showing diagnosis of DVT in the left leg   Imaging Studies ordered:  I ordered imaging studies including plain films of the right knee I independently visualized and interpreted imaging which showed tricompartmental degenerative changes with no fracture or effusion noted I agree with the radiologist interpretation   Test / Admission - Considered:  No fracture or effusion was noted on imaging.  There is no visible hematoma.  This seems to be most likely a contusion.  Patient may take Tylenol as needed for pain.  She is unable to take anti-inflammatories due to being on Eliquis at this time.  There is no indication for admission or further work-up at this time.  Patient may discharge home.  She does have tricompartmental degenerative changes.  Orthopedic on-call provider information provided for follow-up if needed        Final Clinical Impression(s) / ED Diagnoses Final diagnoses:  Acute pain of right  knee    Rx / DC Orders ED Discharge Orders     None         Ronny Bacon 08/01/22 1104    Ronny Bacon 08/01/22 1114    Hayden Rasmussen, MD 08/01/22 1737

## 2022-08-01 NOTE — Discharge Instructions (Addendum)
You were seen today for right knee pain.  Imaging did not show a fracture or dislocation.  There was no sign of a joint effusion.  This is likely pain due to bruising.  I recommend icing the area and elevating as you are able.  You may take Tylenol as needed for pain.  If you continue to have pain in the right knee, I recommend follow-up with orthopedics.  You do have signs of arthritis in the right knee.  Orthopedic providers contact information is provided

## 2022-08-04 ENCOUNTER — Ambulatory Visit (INDEPENDENT_AMBULATORY_CARE_PROVIDER_SITE_OTHER): Payer: Medicare Other | Admitting: Vascular Surgery

## 2022-08-04 ENCOUNTER — Encounter: Payer: Self-pay | Admitting: Vascular Surgery

## 2022-08-04 VITALS — BP 141/82 | HR 71 | Temp 97.9°F | Ht 66.0 in | Wt 246.5 lb

## 2022-08-04 DIAGNOSIS — I82432 Acute embolism and thrombosis of left popliteal vein: Secondary | ICD-10-CM | POA: Diagnosis not present

## 2022-08-04 NOTE — Progress Notes (Signed)
Patient ID: Roberta [name], female   DOB: 10-04-1956, 66 y.o.   MRN: [id number]  Reason for Consult: New Patient (Initial Visit)   Referred by Precious Gilding, DO  Subjective:     HPI:  Roberta [name] is a 66 y.o. female without history of DVT was recently diagnosed with left lower extremity DVT after recent travel.  She is now taking Eliquis states that she was on baby aspirin at the time of the clot.  She states that she drove 3 hours and then sent for an additional time.  Drove back 3 hours at the time of the blood clot but had never done this before.  She is up-to-date on her cancer screenings and follows closely for previous diagnosis of uterine cancer 2017.  States that she has mammograms and colonoscopies that are up-to-date as well.  She no longer has any left lower extremity swelling.  She does not wear compression socks.  She walks without limitation.  Past Medical History:  Diagnosis Date   Abnormal EKG    nonspecific ST and T-wave changes - Followed by Dr.Berry   ALLERGIC RHINITIS 10/14/2010   Anemia    Asthma    ASTHMA, INTERMITTENT 02/16/2007   BACK PAIN, CHRONIC 03/25/2009   CERVICAL RADICULOPATHY 10/16/2009   DDD (degenerative disc disease), lumbosacral    Depression    DEPRESSIVE DISORDER NOT ELSEWHERE CLASSIFIED 03/25/2009   Endometrial cancer (HCC)    GERD (gastroesophageal reflux disease) 09/09/2011   Glaucoma    sees optho every 3 months, drops each night   Hyperlipidemia    Hypertension    PONV (postoperative nausea and vomiting)    Postmenopausal vaginal bleeding 09/24/2014   Family History  Problem Relation Age of Onset   Lymphoma Mother        related to asbestos   Cancer Mother        lymphoma (asbestos exposure)   Alcoholism Father    Cirrhosis Father        due to alcohol   Asthma Father    Cancer Brother    Cancer Maternal Uncle        lung   Cancer Maternal Uncle        lung   Past Surgical History:  Procedure Laterality Date   ANKLE  SURGERY     BIOPSY BREAST     LEFT   BREAST CYST INCISION AND DRAINAGE     under breast   BUNIONECTOMY     bilateral and toe correction   CARPAL TUNNEL RELEASE Bilateral 2009   CATARACT SURGERY     CERVICAL SPINE SURGERY     CHOLECYSTECTOMY  1980   DILATATION & CURRETTAGE/HYSTEROSCOPY WITH RESECTOCOPE N/A 07/28/2015   Procedure: [place];  Surgeon: Eldred Manges, MD;  Location: [facility];  Service: Gynecology;  Laterality: N/A;   KNEE ARTHROSCOPY     ROBOTIC ASSISTED TOTAL HYSTERECTOMY WITH BILATERAL SALPINGO OOPHERECTOMY Bilateral 09/04/2015   Procedure: ROBOTIC ASSISTED HYSTERECTOMY WITH BILATERAL SALPINGO OOPHORECTOMY SENTINAL NODE MAPPING ;  Surgeon: Everitt Amber, MD;  Location: [facility];  Service: Gynecology;  Laterality: Bilateral;   ROTATOR CUFF REPAIR Right May 2014    Short Social History:  Social History   Tobacco Use   Smoking status: Never    Passive exposure: Never   Smokeless tobacco: Never  Substance Use Topics   Alcohol use: No    Alcohol/week: 0.0 standard drinks of alcohol    Allergies  Allergen Reactions  Influenza Vaccines Anaphylaxis    Throat swelling reported after flu shot in the past (cannot verify with records but would not give)   Budesonide Other (See Comments)    Throat infection per patient   Flonase [Fluticasone Propionate] Other (See Comments)    Upper respitory  issues   Hydrocodone-Acetaminophen Nausea And Vomiting   Codeine Nausea And Vomiting   Morphine And Related Nausea And Vomiting   Nickel Rash   Phenobarbital Hypertension and Rash    Current Outpatient Medications  Medication Sig Dispense Refill   albuterol (PROAIR HFA) 108 (90 Base) MCG/ACT inhaler Inhale 2 puffs into the lungs every 4 (four) hours as needed. 18 g 3   albuterol (PROVENTIL) (2.5 MG/3ML) 0.083% nebulizer solution Take 3 mLs (2.5 mg total) by nebulization every 6 (six) hours as needed for wheezing or shortness of breath. 75  mL 1   APIXABAN (ELIQUIS) VTE STARTER PACK ('10MG'$  AND '5MG'$ ) Take as directed on package: start with two-'5mg'$  tablets twice daily for 7 days. On day 8, switch to one-'5mg'$  tablet twice daily. 74 tablet 0   Ascorbic Acid (VITAMIN C PO) Take by mouth.     cetirizine (ZYRTEC) 10 MG tablet Take 1 tablet (10 mg total) by mouth daily. 30 tablet 1   cyclobenzaprine (FLEXERIL) 10 MG tablet Take 10 mg daily as needed by mouth for muscle spasms.     dorzolamide-timolol (COSOPT) 22.3-6.8 MG/ML ophthalmic solution Place 1 drop into both eyes 2 (two) times daily. 10 mL 12   esomeprazole (NEXIUM) 20 MG capsule TAKE 1 CAPSULE(20 MG) BY MOUTH DAILY AS NEEDED FOR STOMACH ACID OR REFLUX 90 capsule 1   hydrochlorothiazide (HYDRODIURIL) 12.5 MG tablet TAKE 1 TABLET(12.5 MG) BY MOUTH DAILY AS NEEDED FOR SWELLING 90 tablet 0   lisinopril (ZESTRIL) 30 MG tablet Take 1 tablet (30 mg total) by mouth at bedtime. 90 tablet 3   LORazepam (ATIVAN) 0.5 MG tablet Take 1 tablet (0.5 mg total) by mouth daily as needed for anxiety (airplane ride). 2 tablet 0   meclizine (ANTIVERT) 12.5 MG tablet Take 1 tablet (12.5 mg total) by mouth 3 (three) times daily as needed. 90 tablet 0   Multiple Vitamins-Minerals (HAIR SKIN AND NAILS FORMULA PO) Take 1 tablet 3 (three) times a week by mouth.      ondansetron (ZOFRAN) 4 MG tablet TAKE 1 TABLET BY MOUTH EVERY 8 HOURS AS NEEDED FOR NAUSEA OR VOMITING 20 tablet 0   traMADol (ULTRAM) 50 MG tablet Take 50 mg daily as needed by mouth for moderate pain.     Travoprost, BAK Free, (TRAVATAN) 0.004 % SOLN ophthalmic solution Place 1 drop into both eyes at bedtime. 2.5 mL 3   No current facility-administered medications for this visit.    Review of Systems  Constitutional:  Constitutional negative. HENT: HENT negative.  Eyes: Eyes negative.  Respiratory: Respiratory negative.  Cardiovascular: Cardiovascular negative.  GI: Gastrointestinal negative.  Musculoskeletal: Musculoskeletal negative.   Skin: Skin negative.  Neurological: Neurological negative. Hematologic: Hematologic/lymphatic negative.  Psychiatric: Psychiatric negative.        Objective:  Objective   Vitals:   08/04/22 1132  BP: (!) 141/82  Pulse: 71  Temp: 97.9 F (36.6 C)  SpO2: 95%    Physical Exam HENT:     Head: Normocephalic.     Nose: Nose normal.  Eyes:     Pupils: Pupils are equal, round, and reactive to light.  Cardiovascular:     Rate and Rhythm: Normal rate.  Pulmonary:  Effort: Pulmonary effort is normal.  Abdominal:     General: Abdomen is flat.  Musculoskeletal:     Cervical back: Normal range of motion and neck supple.     Right lower leg: No edema.     Left lower leg: No edema.  Skin:    General: Skin is warm and dry.     Capillary Refill: Capillary refill takes less than 2 seconds.  Neurological:     General: No focal deficit present.     Mental Status: She is alert.  Psychiatric:        Mood and Affect: Mood normal.        Thought Content: Thought content normal.        Judgment: Judgment normal.     Data: LEFT     CompressibilityPhasicitySpontaneityProperties     Thrombus  Aging  +---------+---------------+---------+-----------+---------------+----------  ----+  CFV      Full           Yes      Yes                                         +---------+---------------+---------+-----------+---------------+----------  ----+  SFJ      Full                                                                +---------+---------------+---------+-----------+---------------+----------  ----+  FV Prox  Full                                                                +---------+---------------+---------+-----------+---------------+----------  ----+  FV Mid   Full                                                                +---------+---------------+---------+-----------+---------------+----------  ----+  FV DistalFull                                                                 +---------+---------------+---------+-----------+---------------+----------  ----+  PFV      Full                                                                +---------+---------------+---------+-----------+---------------+----------  ----+  POP      Partial        Yes  Yes        Mobile, poorly Acute                                                        attached                         +---------+---------------+---------+-----------+---------------+----------  ----+  PTV                     Yes      Yes                                         +---------+---------------+---------+-----------+---------------+----------  ----+  PERO                    Yes      Yes                                         +---------+---------------+---------+-----------+---------------+----------  ----+  Gastroc  None           No       No                        Acute            +---------+---------------+---------+-----------+---------------+----------  ----+  SSV      None           No       No                        Acute            +---------+---------------+---------+-----------+---------------+----------  ----+          Assessment/Plan:    65 year old female with recent popliteal DVT now on Eliquis.  She will need to complete 3 months of Eliquis and then can transition back to baby aspirin.  I recommended not driving long periods of time and also wearing compression stockings knee-high that can be mild to moderate intensity.  She needs to continue to walk daily as a form of exercise elevating her legs when she is recumbent.  She can follow-up with me on an as-needed basis.     Waynetta Sandy MD [facility]

## 2022-08-05 ENCOUNTER — Other Ambulatory Visit (HOSPITAL_BASED_OUTPATIENT_CLINIC_OR_DEPARTMENT_OTHER): Payer: Self-pay

## 2022-08-05 ENCOUNTER — Telehealth (HOSPITAL_BASED_OUTPATIENT_CLINIC_OR_DEPARTMENT_OTHER): Payer: Self-pay

## 2022-08-05 NOTE — Telephone Encounter (Signed)
Pharmacy Transitions of Care Follow-up Telephone Call  Date of discharge: 07/23/22  Discharge Diagnosis: DVT  How have you been since you were released from the hospital? Patient is doing well despite a fall and feeling weak. Has another follow up appointment next week and she will ask for more refills.    Medication changes made at discharge: START taking these medications  START taking these medications  Eliquis DVT/PE Starter Pack Generic drug: Apixaban Starter Pack ('10mg'$  and '5mg'$ ) Take as directed on package: start with two-'5mg'$  tablets twice daily for 7 days. On day 8, switch to one-'5mg'$  tablet twice daily.   ASK your doctor about these medications  ASK your doctor about these medications  * albuterol (2.5 MG/3ML) 0.083% nebulizer solution Commonly known as: PROVENTIL Take 3 mLs (2.5 mg total) by nebulization every 6 (six) hours as needed for wheezing or shortness of breath.  * albuterol 108 (90 Base) MCG/ACT inhaler Commonly known as: ProAir HFA Inhale 2 puffs into the lungs every 4 (four) hours as needed.  cetirizine 10 MG tablet Commonly known as: ZYRTEC Take 1 tablet (10 mg total) by mouth daily.  cyclobenzaprine 10 MG tablet Commonly known as: FLEXERIL  dorzolamide-timolol 22.3-6.8 MG/ML ophthalmic solution Commonly known as: COSOPT Place 1 drop into both eyes 2 (two) times daily.  esomeprazole 20 MG capsule Commonly known as: NEXIUM TAKE 1 CAPSULE(20 MG) BY MOUTH DAILY AS NEEDED FOR STOMACH ACID OR REFLUX  HAIR SKIN AND NAILS FORMULA PO  hydrochlorothiazide 12.5 MG tablet Commonly known as: HYDRODIURIL TAKE 1 TABLET(12.5 MG) BY MOUTH DAILY AS NEEDED FOR SWELLING  lisinopril 30 MG tablet Commonly known as: ZESTRIL Take 1 tablet (30 mg total) by mouth at bedtime.  LORazepam 0.5 MG tablet Commonly known as: ATIVAN Take 1 tablet (0.5 mg total) by mouth daily as needed for anxiety (airplane ride).  meclizine 12.5 MG tablet Commonly known as: ANTIVERT Take 1 tablet  (12.5 mg total) by mouth 3 (three) times daily as needed.  traMADol 50 MG tablet Commonly known as: ULTRAM  Travoprost (BAK Free) 0.004 % Soln ophthalmic solution Commonly known as: TRAVATAN Place 1 drop into both eyes at bedtime.  VITAMIN C PO    Medication changes verified by the patient? yes    Medication Accessibility:  Home Pharmacy: [facility]   Was the patient provided with refills on discharged medications? no   Have all prescriptions been transferred from [facility] to home pharmacy? N/a   Is the patient interested in using a [place]? possibly  Is the patient able to afford medications? N/a  Referred patient to patient care advocate for medication assistance? no  Medication Review:  APIXABAN (ELIQUIS)  Apixaban 10 mg BID initiated on 07/24/22 for DVT. Will switch to apixaban 5 mg BID after 7 days (DATE 07/31/22).  - Discussed importance of taking medication around the same time every day.  - Advised patient of medications to avoid (NSAIDs, ASA maintenance doses>100 mg daily)  - Educated that Tylenol (acetaminophen) will be the preferred analgesic to prevent risk of bleeding. - Emphasized importance of monitoring for signs and symptoms of bleeding (abnormal bruising, prolonged bleeding, nose bleeds, bleeding from gums, discolored urine, black tarry stools)  - Advised patient to alert all providers of anticoagulation therapy prior to starting a new medication or having a procedure.   Follow-up Appointments: Date Visit Type Length Department    08/10/2022  3:35 PM OFFICE VISIT 20 min [place] [[id number]]    If  their condition worsens, is the pt aware to call PCP or go to the Emergency Dept.? yes  Final Patient Assessment: Patient is doing well despite a fall and feeling weak. Has another follow up appointment next week and she will ask for more refills.    Darcus Austin, PharmD Clinical Pharmacist [facility]patient Pharm[facility] 08/05/2022 2:35 PM

## 2022-08-05 NOTE — Telephone Encounter (Signed)
Transitions of Care Pharmacy   Call attempted for a pharmacy transitions of care follow-up. HIPAA appropriate voicemail was left with call back information provided.   Call attempt #1. Will follow-up in 2-3 days.   Darcus Austin, PharmD Clinical Pharmacist [facility] 08/05/2022 2:24 PM

## 2022-08-09 NOTE — Progress Notes (Signed)
    SUBJECTIVE:   CHIEF COMPLAINT / HPI:   Acute DVT Patient was seen in ED on 07/23/2022 for acute thought to be provoked DVT after traveling.  She was started on Eliquis and recommended to have close follow-up with vascular surgeon outpatient. Pt saw vascular surgeon on 8/16 who advised her to continue on Eliquis for 3 months and then transition to ASA 81 mg daily and f/u with him only as needed. Today pt asks if she should take Eliquis longer than 3 months to ensure she does not still have a clot or at risk for PE. Pt denies any pain or swelling in her L leg currently. She does have a questionable hx of prior superficial DVTs.   Burning pain in R lower extremity For past a week and a half pt has had paresthesias located on lateral aspect of calf of R LE. She states it is worse after walking, the sensation is not terrible, she rubs her skin and it will eventually go away but then later return.     PERTINENT  PMH / PSH: HTN, HLD  OBJECTIVE:   Today's Vitals   08/10/22 1555  BP: 136/84  Pulse: 71  SpO2: 98%  Weight: 246 lb 12.8 oz (111.9 kg)  Height: '5\' 6"'$  (1.676 m)   Body mass index is 39.83 kg/m.   General: NAD, pleasant, able to participate in exam Cardiac: RRR, no murmurs. Respiratory: CTAB, normal effort, No wheezes, rales or rhonchi Extremities: no edema or erythema of BLEs, no reproducible paresthesias of R LE with light touch.  Skin: warm and dry, no rashes noted Neuro: alert, no obvious focal deficits Psych: Normal affect and mood  ASSESSMENT/PLAN:   Acute deep vein thrombosis (DVT) of popliteal vein of left lower extremity (HCC) Pt will continue Eliquis for 3 months with a plan to return at least a week prior to the end of the 3 month period to discuss cessation and switching to bASA vs continuation. She asked about any labs/studies that should be done at follow up.  Trending a D-dimer is not typically done after a few months of treatment in the setting of a provoked  DVT but can be used if desired to help guide decision making about duration of therapy for an unprovoked DVT. Hers is thought to be provoked as it occurred after over 3 hours of traveling in a car with no breaks. However, she does have a questionable history of prior DVTs per chart review. Shared decision making should be used to determine if repeat d-dimer at follow up is appropriate and risk/benefit discussion had on continuing Eliquis for longer duration than 3 months (bleeding risk vs need for anticoagulation).  Right leg paresthesias This problem is very acute and not particularly bothersome to pt. This sounds likely nerve related but I am unsure of the etiology. There is no swelling or erythema of either leg and paresthesias are not reproducible on exam.  She is advised to monitor these symptoms and return if they worsen or persist.   Dr. Precious Gilding, [place]

## 2022-08-10 ENCOUNTER — Encounter: Payer: Self-pay | Admitting: Student

## 2022-08-10 ENCOUNTER — Ambulatory Visit (INDEPENDENT_AMBULATORY_CARE_PROVIDER_SITE_OTHER): Payer: Medicare Other | Admitting: Student

## 2022-08-10 VITALS — BP 136/84 | HR 71 | Ht 66.0 in | Wt 246.8 lb

## 2022-08-10 DIAGNOSIS — R202 Paresthesia of skin: Secondary | ICD-10-CM | POA: Diagnosis not present

## 2022-08-10 DIAGNOSIS — I82432 Acute embolism and thrombosis of left popliteal vein: Secondary | ICD-10-CM | POA: Diagnosis not present

## 2022-08-10 NOTE — Patient Instructions (Signed)
It was great to see you! Thank you for allowing me to participate in your care!  I recommend that you always bring your medications to each appointment as this makes it easy to ensure you are on the correct medications and helps [place] not miss when refills are needed.  Our plans for today:  - Return about a week before Eliquis is complete to discuss transition to aspirin - Return as needed for leg pain  Take care and seek immediate care sooner if you develop any concerns.   Dr. Precious Gilding, DO [facility]

## 2022-08-11 DIAGNOSIS — I82432 Acute embolism and thrombosis of left popliteal vein: Secondary | ICD-10-CM | POA: Insufficient documentation

## 2022-08-11 DIAGNOSIS — R202 Paresthesia of skin: Secondary | ICD-10-CM | POA: Insufficient documentation

## 2022-08-11 NOTE — Assessment & Plan Note (Addendum)
Pt will continue Eliquis for 3 months with a plan to return at least a week prior to the end of the 3 month period to discuss cessation and switching to bASA vs continuation. She asked about any labs/studies that should be done at follow up.  Trending a D-dimer is not typically done after a few months of treatment in the setting of a provoked DVT but can be used if desired to help guide decision making about duration of therapy for an unprovoked DVT. Hers is thought to be provoked as it occurred after over 3 hours of traveling in a car with no breaks. However, she does have a questionable history of prior DVTs per chart review. Shared decision making should be used to determine if repeat d-dimer at follow up is appropriate and risk/benefit discussion had on continuing Eliquis for longer duration than 3 months (bleeding risk vs need for anticoagulation).

## 2022-08-11 NOTE — Assessment & Plan Note (Signed)
This problem is very acute and not particularly bothersome to pt. This sounds likely nerve related but I am unsure of the etiology. There is no swelling or erythema of either leg and paresthesias are not reproducible on exam.  She is advised to monitor these symptoms and return if they worsen or persist.

## 2022-08-15 ENCOUNTER — Other Ambulatory Visit (HOSPITAL_COMMUNITY): Payer: Self-pay

## 2022-08-15 ENCOUNTER — Encounter: Payer: Self-pay | Admitting: Student

## 2022-08-16 ENCOUNTER — Other Ambulatory Visit: Payer: Self-pay | Admitting: Student

## 2022-08-16 DIAGNOSIS — I82432 Acute embolism and thrombosis of left popliteal vein: Secondary | ICD-10-CM

## 2022-08-16 MED ORDER — APIXABAN 5 MG PO TABS: 5.0000 mg | ORAL_TABLET | Freq: Two times a day (BID) | ORAL | 0 refills | Status: DC

## 2022-08-18 ENCOUNTER — Encounter: Payer: Self-pay | Admitting: Student

## 2022-08-19 MED ORDER — MECLIZINE HCL 12.5 MG PO TABS
12.5000 mg | ORAL_TABLET | Freq: Three times a day (TID) | ORAL | 0 refills | Status: AC | PRN
Start: 2022-08-19 — End: ?

## 2022-09-14 ENCOUNTER — Other Ambulatory Visit (HOSPITAL_COMMUNITY): Payer: Self-pay

## 2022-09-29 DIAGNOSIS — H40013 Open angle with borderline findings, low risk, bilateral: Secondary | ICD-10-CM | POA: Diagnosis not present

## 2022-10-10 ENCOUNTER — Other Ambulatory Visit: Payer: Self-pay | Admitting: Student

## 2022-10-11 ENCOUNTER — Other Ambulatory Visit: Payer: Self-pay | Admitting: Student

## 2022-10-11 DIAGNOSIS — I82432 Acute embolism and thrombosis of left popliteal vein: Secondary | ICD-10-CM

## 2022-10-27 ENCOUNTER — Telehealth: Payer: Self-pay

## 2022-10-27 NOTE — Patient Outreach (Signed)
  Care Coordination   Initial Visit Note   10/27/2022 Name: Roberta [name] MRN: [id number] DOB: Aug 08, 1956  Roberta [name] is a 66 y.o. year old female who sees Precious Gilding, DO for primary care. I spoke with  Roberta [name] by phone today.  What matters to the patients health and wellness today?  "Thank you for letting me know about your services and I will be in touch with the office if I need anything".    Goals Addressed               This Visit's Progress     COMPLETED: "I will let you know if I need assistance" (pt-stated)        Care Coordination Interventions: Discussed plans with patient for ongoing care management follow up and provided patient with direct contact information for care management team Assessed social determinant of health barriers          SDOH assessments and interventions completed:  Yes  SDOH Interventions Today    Flowsheet Row Most Recent Value  SDOH Interventions   Transportation Interventions Intervention Not Indicated  Financial Strain Interventions Intervention Not Indicated        Care Coordination Interventions Activated:  Yes  Care Coordination Interventions:  No, not indicated   Follow up plan: No further intervention required.   Encounter Outcome:  Pt. Visit Completed

## 2022-10-27 NOTE — Telephone Encounter (Signed)
error 

## 2022-11-02 ENCOUNTER — Ambulatory Visit (INDEPENDENT_AMBULATORY_CARE_PROVIDER_SITE_OTHER): Payer: Medicare Other | Admitting: Student

## 2022-11-02 ENCOUNTER — Ambulatory Visit: Payer: Medicare Other | Admitting: Student

## 2022-11-02 ENCOUNTER — Encounter: Payer: Self-pay | Admitting: Student

## 2022-11-02 VITALS — BP 120/82 | HR 74 | Ht 66.0 in | Wt 245.0 lb

## 2022-11-02 DIAGNOSIS — M79671 Pain in right foot: Secondary | ICD-10-CM | POA: Diagnosis not present

## 2022-11-02 DIAGNOSIS — M79672 Pain in left foot: Secondary | ICD-10-CM

## 2022-11-02 DIAGNOSIS — R7303 Prediabetes: Secondary | ICD-10-CM

## 2022-11-02 DIAGNOSIS — I82432 Acute embolism and thrombosis of left popliteal vein: Secondary | ICD-10-CM | POA: Diagnosis not present

## 2022-11-02 LAB — POCT GLYCOSYLATED HEMOGLOBIN (HGB A1C): HbA1c, POC (controlled diabetic range): 6 % (ref 0.0–7.0)

## 2022-11-02 MED ORDER — ASPIRIN 81 MG PO TBEC: 81.0000 mg | DELAYED_RELEASE_TABLET | Freq: Every day | ORAL | 12 refills | Status: DC

## 2022-11-02 NOTE — Progress Notes (Unsigned)
    SUBJECTIVE:   CHIEF COMPLAINT / HPI:   Acute DVT Previous note from vascular surgeon advised patient to transition to baby aspirin daily after 3 months of Eliquis. It has now been just over 3 months.  States she does not wish to continue Eliquis and feels ready to make the transition.  Painful feet Painful "tight" feeling calluses on L foot for past 4 months after wearing a pair of new shoes.  Similar areas developing on the right foot which is also becoming painful.  Now can only wear slippers.  States she has had problems with her feet previously due to them being very flat and has tried inserts before.  Obesity and prediabetes Patient was previously diagnosed prediabetic due to A1c of 6.3.  She desires weight loss but is having a difficult time with exercising currently because of her feet.  Would like A1c checked today.  PERTINENT  PMH / PSH: DVT  OBJECTIVE:   Vitals:   11/02/22 1502  BP: 120/82  Pulse: 74  SpO2: 93%     General: NAD, pleasant, able to participate in exam Cardiac: RRR, no murmurs. Respiratory: CTAB, normal effort, No wheezes, rales or rhonchi Extremities: no edema or cyanosis.  +2/4 dorsalis pedis pulses bilaterally.  Callused areas on balls of feet and plantar side of great toes bilaterally with vascular appearing lesions under calluses Neuro: alert, no obvious focal deficits Psych: Normal affect and mood  ASSESSMENT/PLAN:   Acute deep vein thrombosis (DVT) of popliteal vein of left lower extremity ([place]) As the acute DVT was likely provoked by traveling long distance without a break, she is comfortable with stopping the blood thinner as its been over 3 months and transitioning to baby aspirin daily starting tomorrow.  Foot pain, bilateral Patient agrees to see a podiatrist for the painful areas on the balls of both feet which are callused areas with likely blood blisters underneath.  She was advised to bring any previous shoe inserts, braces, etc. to  the podiatry appointment  Pre-diabetes Patient is still prediabetic today with A1c of 6.  She would really like to bring this down and lose weight.  Advised patient to make a separate appointment in the near future if she would like to further discuss this.  I did provide patient with handout on diabetes and nutrition which we briefly discussed.     Dr. Precious Gilding, [place]

## 2022-11-02 NOTE — Patient Instructions (Signed)
It was great to see you! Thank you for allowing me to participate in your care!   Our plans for today:  - STOP taking eliquis -START taking baby aspirin (81 mg) once daily  - The podiatrist should call you to schedule an appointment  -Return in 4 to 6 months or sooner if needed   Take care and seek immediate care sooner if you develop any concerns.   Dr. Precious Gilding, DO [facility]

## 2022-11-03 DIAGNOSIS — M79671 Pain in right foot: Secondary | ICD-10-CM | POA: Insufficient documentation

## 2022-11-03 NOTE — Assessment & Plan Note (Signed)
As the acute DVT was likely provoked by traveling long distance without a break, she is comfortable with stopping the blood thinner as its been over 3 months and transitioning to baby aspirin daily starting tomorrow.

## 2022-11-03 NOTE — Assessment & Plan Note (Signed)
Patient is still prediabetic today with A1c of 6.  She would really like to bring this down and lose weight.  Advised patient to make a separate appointment in the near future if she would like to further discuss this.  I did provide patient with handout on diabetes and nutrition which we briefly discussed.

## 2022-11-03 NOTE — Assessment & Plan Note (Signed)
Patient agrees to see a podiatrist for the painful areas on the balls of both feet which are callused areas with likely blood blisters underneath.  She was advised to bring any previous shoe inserts, braces, etc. to the podiatry appointment

## 2022-11-08 ENCOUNTER — Other Ambulatory Visit (HOSPITAL_COMMUNITY): Payer: Self-pay

## 2022-11-09 ENCOUNTER — Telehealth: Payer: Self-pay

## 2022-11-09 NOTE — Telephone Encounter (Signed)
Patient calls nurse line regarding referral for podiatrist.   She reports that she missed initial phone call on 11/17 and has been trying to call and schedule ever since. She has not been able to reach anyone in regards to scheduling new patient appointment.   Verified that patient is calling the correct phone number.   Patient is requesting that referral be sent to a different podiatry office.   Will forward to [place].   Talbot Grumbling, RN

## 2022-11-15 ENCOUNTER — Ambulatory Visit (INDEPENDENT_AMBULATORY_CARE_PROVIDER_SITE_OTHER): Payer: Medicare Other | Admitting: Family Medicine

## 2022-11-15 ENCOUNTER — Encounter: Payer: Self-pay | Admitting: Family Medicine

## 2022-11-15 VITALS — BP 139/70 | HR 72 | Temp 98.4°F | Wt 248.5 lb

## 2022-11-15 DIAGNOSIS — J029 Acute pharyngitis, unspecified: Secondary | ICD-10-CM

## 2022-11-15 DIAGNOSIS — R059 Cough, unspecified: Secondary | ICD-10-CM | POA: Diagnosis not present

## 2022-11-15 LAB — POCT INFLUENZA A/B
Influenza A, POC: NEGATIVE
Influenza B, POC: NEGATIVE

## 2022-11-15 LAB — POCT RAPID STREP A (OFFICE): Rapid Strep A Screen: NEGATIVE

## 2022-11-15 NOTE — Progress Notes (Signed)
    SUBJECTIVE:   CHIEF COMPLAINT / HPI: sick  Around someone sick on the Saturday before thanks giving. Noticed scratchy throat and runny nose and feeling bad. Sore throat has improved. Some wheezing two days ago. Cough from drainage at night. No fevers. Appetite has been decreased, but still able to eat. No nausea or vomiting. Did not have to use albuterol over the past week. Took some Nyquil yesterday. Helped get some rest overnight. Has sinus pressure and intermittent headache.  No nausea or vomiting, no hemoptysis.  PERTINENT  PMH / PSH: HTN, hx of acute DVT, asthma  OBJECTIVE:   BP 139/70   Pulse 72   Temp 98.4 F (36.9 C) (Oral)   Wt 248 lb 8 oz (112.7 kg)   SpO2 100%   BMI 40.11 kg/m   General: NAD  HEENT: Pupils equal round and reactive, moist mucous membranes, posterior oropharyngeal erythema, no signs of tonsillar adenitis or exudates, bilateral nasal turbinates noninflamed, left-sided rhinorrhea, external auditory canals normal,  Cardiovascular: RRR, no murmurs, no peripheral edema Respiratory: normal WOB on room air, CTAB, no wheezes, ronchi or rales Extremities: Moving all 4 extremities equally   ASSESSMENT/PLAN:   Acute pharyngitis Patient likely has viral URI in the setting of recent contact with sick person.  Vitals are stable.  Patient has not required use of inhaler.  No indications of bacterial infection at this time.  Discussed with patient if symptoms do not improve over the next 3 days please call the clinic for possible antibiotic treatment. -Strep test negative, rapid flu negative - COVID test pending    Salvadore Oxford, MD [place]

## 2022-11-15 NOTE — Patient Instructions (Signed)
It was great to see you! Thank you for allowing me to participate in your care!  I recommend that you always bring your medications to each appointment as this makes it easy to ensure we are on the correct medications and helps [place] not miss when refills are needed.  Our plans for today:  -Continue supportive measures for your viral infection.  Please use Tylenol as needed and cough drops for your sore throat. -Please call the clinic if your symptoms do not improve in the next 3 days.  As we will likely treat this as a bacterial infection with antibiotics. - I will let you know if any of your infection tests today are positive.  We are checking some labs today, I will call you if they are abnormal will send you a MyChart message or a letter if they are normal.  If you do not hear about your labs in the next 2 weeks please let us know.  Take care and seek immediate care sooner if you develop any concerns.   Dr. Salvadore Oxford, MD [place]

## 2022-11-15 NOTE — Telephone Encounter (Signed)
Patient has been scheduled with [facility] for 11/24/22

## 2022-11-15 NOTE — Assessment & Plan Note (Addendum)
Patient likely has viral URI in the setting of recent contact with sick person.  Vitals are stable.  Patient has not required use of inhaler.  No indications of bacterial infection at this time.  Discussed with patient if symptoms do not improve over the next 3 days please call the clinic for possible antibiotic treatment. -Strep test negative, rapid flu negative - COVID test pending

## 2022-11-17 LAB — NOVEL CORONAVIRUS, NAA: SARS-CoV-2, NAA: NOT DETECTED

## 2022-11-24 ENCOUNTER — Encounter: Payer: Self-pay | Admitting: Podiatry

## 2022-11-24 ENCOUNTER — Ambulatory Visit (INDEPENDENT_AMBULATORY_CARE_PROVIDER_SITE_OTHER): Payer: Medicare Other

## 2022-11-24 ENCOUNTER — Ambulatory Visit (INDEPENDENT_AMBULATORY_CARE_PROVIDER_SITE_OTHER): Payer: Medicare Other | Admitting: Podiatry

## 2022-11-24 VITALS — BP 173/97 | HR 119

## 2022-11-24 DIAGNOSIS — M2141 Flat foot [pes planus] (acquired), right foot: Secondary | ICD-10-CM | POA: Diagnosis not present

## 2022-11-24 DIAGNOSIS — M2142 Flat foot [pes planus] (acquired), left foot: Secondary | ICD-10-CM

## 2022-11-24 NOTE — Progress Notes (Signed)
Chief Complaint  Patient presents with   Foot Pain    Patient is here for bilateral foot pain for 1 year, she wears foot brace on both feet.    Subjective:  66 y.o. female presenting today as a new patient for evaluation of symptomatic calluses that have developed to the patient's bilateral great toes.  Patient states that she does have a history of flatfeet and history of bunion surgery correction several years prior.  She states that recently she changed her shoes and developed calluses which are symptomatic and tender to the bilateral feet.  She presents for further treatment and evaluation   Past Medical History:  Diagnosis Date   Abnormal EKG    nonspecific ST and T-wave changes - Followed by Dr.Berry   ALLERGIC RHINITIS 10/14/2010   Anemia    Asthma    ASTHMA, INTERMITTENT 02/16/2007   BACK PAIN, CHRONIC 03/25/2009   CERVICAL RADICULOPATHY 10/16/2009   DDD (degenerative disc disease), lumbosacral    Depression    DEPRESSIVE DISORDER NOT ELSEWHERE CLASSIFIED 03/25/2009   Endometrial cancer ([place])    GERD (gastroesophageal reflux disease) 09/09/2011   Glaucoma    sees optho every 3 months, drops each night   Hyperlipidemia    Hypertension    PONV (postoperative nausea and vomiting)    Postmenopausal vaginal bleeding 09/24/2014   Viral URI with cough 03/28/2013   Past Surgical History:  Procedure Laterality Date   ANKLE SURGERY     BIOPSY BREAST     LEFT   BREAST CYST INCISION AND DRAINAGE     under breast   BUNIONECTOMY     bilateral and toe correction   CARPAL TUNNEL RELEASE Bilateral 2009   CATARACT SURGERY     CERVICAL [place] N/A 07/28/2015   Procedure: [place];  Surgeon: Eldred Manges, MD;  Location: [facility];  Service: Gynecology;  Laterality: N/A;   KNEE ARTHROSCOPY     ROBOTIC ASSISTED TOTAL HYSTERECTOMY WITH BILATERAL  SALPINGO OOPHERECTOMY Bilateral 09/04/2015   Procedure: ROBOTIC ASSISTED HYSTERECTOMY WITH BILATERAL SALPINGO OOPHORECTOMY SENTINAL NODE MAPPING ;  Surgeon: Everitt Amber, MD;  Location: [facility];  Service: Gynecology;  Laterality: Bilateral;   ROTATOR CUFF REPAIR Right May 2014   Allergies  Allergen Reactions   Influenza Vaccines Anaphylaxis    Throat swelling reported after flu shot in the past (cannot verify with records but would not give)   Budesonide Other (See Comments)    Throat infection per patient   Flonase [Fluticasone Propionate] Other (See Comments)    Upper respitory  issues   Hydrocodone-Acetaminophen Nausea And Vomiting   Codeine Nausea And Vomiting   Morphine And Related Nausea And Vomiting   Nickel Rash   Phenobarbital Hypertension and Rash       Objective/Physical Exam General: The patient is alert and oriented x3 in no acute distress.  Dermatology: Skin is warm, dry and supple bilateral lower extremities.  There is some hyperkeratotic callus tissue noted to the weightbearing surfaces of the bilateral great toes.  No open wounds or drainage.  Vascular: Palpable pedal pulses bilaterally. No edema or erythema noted. Capillary refill within normal limits.  Neurological: Epicritic and protective threshold grossly intact bilaterally.   Musculoskeletal Exam: Range of motion within normal limits to all pedal and ankle joints bilateral. Muscle strength 5/5 in all groups bilateral.  Upon weightbearing there is a medial longitudinal arch  collapse bilaterally. Remove foot valgus noted to the bilateral lower extremities with excessive pronation upon mid stance.  Radiographic Exam B/L feet 11/24/2022:  Normal osseous mineralization. Joint spaces preserved. No fracture/dislocation/boney destruction.  Pes planus noted on radiographic exam lateral views. Decreased calcaneal inclination and metatarsal declination angle is noted. Anterior break in the cyma line noted on lateral views.  Medial talar head to deviation noted on AP radiograph.  Orthopedic screws also noted along the base of the first metatarsals bilateral.  These appear stable and intact.  History of prior bunion surgery  Assessment: 1. pes planus bilateral 2.  History of prior bunionectomy surgery 3.  Symptomatic calluses bilateral feet   Plan of Care:  1. Patient was evaluated. X-Rays reviewed.  2.  Continue wearing good supportive custom insoles.  Recommend shoes at the shoe market on [address]. 3.  Excisional debridement of the hyperkeratotic preulcerative callus tissue was performed today using a 312 scalpel without incident or bleeding.  Patient felt relief 4.  Return to clinic as needed   Edrick Kins, DPM [facility]  Dr. Edrick Kins, [address]                                        [place], [place] [address]                Office [phone]  Fax [phone]

## 2022-11-29 ENCOUNTER — Encounter: Payer: Self-pay | Admitting: Student

## 2022-12-01 ENCOUNTER — Encounter: Payer: Self-pay | Admitting: Student

## 2022-12-01 ENCOUNTER — Ambulatory Visit (INDEPENDENT_AMBULATORY_CARE_PROVIDER_SITE_OTHER): Payer: Medicare Other | Admitting: Family Medicine

## 2022-12-01 ENCOUNTER — Encounter: Payer: Self-pay | Admitting: Family Medicine

## 2022-12-01 VITALS — BP 138/94 | HR 66 | Ht 66.0 in | Wt 248.8 lb

## 2022-12-01 DIAGNOSIS — R053 Chronic cough: Secondary | ICD-10-CM | POA: Insufficient documentation

## 2022-12-01 DIAGNOSIS — I10 Essential (primary) hypertension: Secondary | ICD-10-CM | POA: Diagnosis not present

## 2022-12-01 DIAGNOSIS — R059 Cough, unspecified: Secondary | ICD-10-CM | POA: Insufficient documentation

## 2022-12-01 NOTE — Progress Notes (Signed)
  SUBJECTIVE:   CHIEF COMPLAINT / HPI:   Nonproductive Cough x51mo-Present since right before thanksgiving -also had flu like symptoms at that time, negative for covid/flu/strep -lost her voice for about 3 weeks, just now starting to come back -Some mild pain in throat while coughing. No vomiting -No CP, SOB, recent fevers, vomiting, diarrhea, abd pain -Has tried honey lemon tea which helps a lot. Nyquil helps sleep.  On lisinopril, dose was increased in February. Home Bps usually run in the 120s/80s. Has not noticed swelling in legs or face. States she does not wish to switch lisinopril to anything else today, since she's been on it for a while and feels good with it.  PERTINENT  PMH / PSH: asthma  OBJECTIVE:  BP (!) 138/94   Pulse 66   Ht '5\' 6"'$  (1.676 m)   Wt 248 lb 12.8 oz (112.9 kg)   SpO2 100%   BMI 40.16 kg/m   General: NAD, pleasant, able to participate in exam Cardiac: RRR, no murmurs auscultated Respiratory: CTAB, normal WOB Abdomen: soft, non-tender, non-distended, normoactive bowel sounds Extremities: warm and well perfused, no edema or cyanosis Skin: warm and dry, no rashes noted Neuro: alert, no obvious focal deficits, speech normal Psych: Normal affect and mood  ASSESSMENT/PLAN:   Persistent cough Assessment & Plan: Ongoing for almost a month. Initially associated w/ URI like symptoms including postnasal drip. Then progressed to laryngitis which she is now recovering from. Feel that the source of her cough is likely related to this initial viral URI (negative for flu/COVID/strep).  However, also considered that this patient is on lisinopril and her dose was increased in February 2023.  Discussed changing this to losartan but patient declined.  Will follow-up in 4 to 6 weeks   HYPERTENSION, BENIGN SYSTEMIC Assessment & Plan: 138/94 today on recheck. Home BP normally runs in 120s/80s per patient.  [facility]  Considered  switching lisinopril to losartan given patient's cough, discussed this with patient who would prefer to continue her lisinopril and follow-up in 4 to 6 weeks to revisit. No other signs of angioedema.     Return in about 4 weeks (around 12/29/2022) for cough, htn.  AAugust Albino MD [facility]

## 2022-12-01 NOTE — Patient Instructions (Signed)
It was wonderful to see you today.  Please bring ALL of your medications with you to every visit.   Updates from today's visit:  Please continue using honey and over the counter remedies for your cough  Please follow up in 1-1.5 months to discuss further and consider switching your blood pressure medicine  Thank you for choosing [facility].   Please call [phone] with any questions about today's appointment.  Please be sure to schedule follow up at the front  desk before you leave today.   August Albino, MD  Family Medicine

## 2022-12-01 NOTE — Assessment & Plan Note (Signed)
Ongoing for almost a month. Initially associated w/ URI like symptoms including postnasal drip. Then progressed to laryngitis which she is now recovering from. Feel that the source of her cough is likely related to this initial viral URI (negative for flu/COVID/strep).  However, also considered that this patient is on lisinopril and her dose was increased in February 2023.  Discussed changing this to losartan but patient declined.  Will follow-up in 4 to 6 weeks

## 2022-12-01 NOTE — Assessment & Plan Note (Signed)
138/94 today on recheck. Home BP normally runs in 120s/80s per patient.  Also feels there is a component of [facility] today.  Considered switching lisinopril to losartan given patient's cough, discussed this with patient who would prefer to continue her lisinopril and follow-up in 4 to 6 weeks to revisit. No other signs of angioedema.

## 2022-12-02 ENCOUNTER — Encounter: Payer: Self-pay | Admitting: Family Medicine

## 2022-12-23 DIAGNOSIS — C801 Malignant (primary) neoplasm, unspecified: Secondary | ICD-10-CM | POA: Diagnosis not present

## 2022-12-23 DIAGNOSIS — K115 Sialolithiasis: Secondary | ICD-10-CM | POA: Diagnosis not present

## 2022-12-23 DIAGNOSIS — J358 Other chronic diseases of tonsils and adenoids: Secondary | ICD-10-CM | POA: Diagnosis not present

## 2022-12-24 DIAGNOSIS — M542 Cervicalgia: Secondary | ICD-10-CM | POA: Diagnosis not present

## 2022-12-27 NOTE — Progress Notes (Deleted)
    SUBJECTIVE:   CHIEF COMPLAINT / HPI:   Cough Patient seen on 12/13 for nonproductive cough present since Thanksgiving at which time she also had URI symptoms. ***Lisinopril versus postviral  PERTINENT  PMH / PSH: ***  OBJECTIVE:   There were no vitals taken for this visit. ***  General: NAD, pleasant, able to participate in exam Cardiac: RRR, no murmurs. Respiratory: CTAB, normal effort, No wheezes, rales or rhonchi Abdomen: Bowel sounds present, nontender, nondistended, no hepatosplenomegaly. Extremities: no edema or cyanosis. Skin: warm and dry, no rashes noted Neuro: alert, no obvious focal deficits Psych: Normal affect and mood  ASSESSMENT/PLAN:   No problem-specific Assessment & Plan notes found for this encounter.     Dr. Precious Gilding, [place]    {    This will disappear when note is signed, click to select method of visit    :1}

## 2022-12-28 ENCOUNTER — Ambulatory Visit: Payer: Medicare Other | Admitting: Student

## 2022-12-28 ENCOUNTER — Encounter: Payer: Self-pay | Admitting: Student

## 2023-01-07 ENCOUNTER — Other Ambulatory Visit: Payer: Self-pay | Admitting: Student

## 2023-01-07 DIAGNOSIS — K219 Gastro-esophageal reflux disease without esophagitis: Secondary | ICD-10-CM

## 2023-01-08 ENCOUNTER — Other Ambulatory Visit: Payer: Self-pay | Admitting: Student

## 2023-01-25 ENCOUNTER — Ambulatory Visit (INDEPENDENT_AMBULATORY_CARE_PROVIDER_SITE_OTHER): Payer: 59 | Admitting: Student

## 2023-01-25 ENCOUNTER — Encounter: Payer: Self-pay | Admitting: Student

## 2023-01-25 ENCOUNTER — Ambulatory Visit
Admission: RE | Admit: 2023-01-25 | Discharge: 2023-01-25 | Disposition: A | Discharge status: Home / Self Care | Payer: 59 | Source: Ambulatory Visit | Attending: Family Medicine | Admitting: Family Medicine

## 2023-01-25 VITALS — BP 132/82 | HR 74 | Ht 66.0 in | Wt 251.0 lb

## 2023-01-25 DIAGNOSIS — R053 Chronic cough: Secondary | ICD-10-CM | POA: Diagnosis not present

## 2023-01-25 DIAGNOSIS — H6990 Unspecified Eustachian tube disorder, unspecified ear: Secondary | ICD-10-CM

## 2023-01-25 MED ORDER — CETIRIZINE HCL 10 MG PO TABS: 10.0000 mg | ORAL_TABLET | Freq: Every day | ORAL | 11 refills | Status: DC

## 2023-01-25 NOTE — Patient Instructions (Addendum)
It was great to see you! Thank you for allowing me to participate in your care!  I recommend that you always bring your medications to each appointment as this makes it easy to ensure you are on the correct medications and helps [place] not miss when refills are needed.  Our plans for today:  - Start taking Zyrtec daily - Go to [facility] imaging, [address] for your chest x-ray. You do not need an appointment.  -Return in about a month for a follow up visit    Take care and seek immediate care sooner if you develop any concerns.   Dr. Precious Gilding, DO [facility]

## 2023-01-25 NOTE — Progress Notes (Unsigned)
    SUBJECTIVE:   CHIEF COMPLAINT / HPI:   Cough Dry cough for past 3 months, started after URI like symptoms in November. Sometimes it will stop for a few days to a week but then comes back like a cough attack. She does have asthma but has not wheezed and doesn't think the cough is related to asthma. She does have a hx of environmental allergies and wonders if she is having allergies. Does have mold in her home which is supposed to be removed by [facility]. Does not want to take flonase, had a rxn years ago. Denies CP, SOB, or fever.   Ear fullness Follows with ENT (they want to take out tonsils d/t halitosis and take out salivary gland - wants referral to ENT )   PERTINENT  PMH / PSH: Asthma   OBJECTIVE:   Vitals:   01/25/23 1350  BP: (!) 140/80  Pulse: 74  SpO2: 94%     General: NAD, pleasant, able to participate in exam Cardiac: RRR, no murmurs. Respiratory: CTAB, normal effort, No wheezes, rales or rhonchi Abdomen: Bowel sounds present, nontender, nondistended, no hepatosplenomegaly. Extremities: no edema or cyanosis. Skin: warm and dry, no rashes noted Neuro: alert, no obvious focal deficits Psych: Normal affect and mood  ASSESSMENT/PLAN:   No problem-specific Assessment & Plan notes found for this encounter.     Dr. Precious Gilding, [place]    {    This will disappear when note is signed, click to select method of visit    :1}

## 2023-01-26 DIAGNOSIS — H6990 Unspecified Eustachian tube disorder, unspecified ear: Secondary | ICD-10-CM | POA: Insufficient documentation

## 2023-01-26 NOTE — Addendum Note (Signed)
Addended by: Micheline Rough on: 01/26/2023 06:19 AM   Modules accepted: Orders

## 2023-01-26 NOTE — Assessment & Plan Note (Signed)
Both TMs are retracted.  This is likely d/t eustachian tube dysfunction causing her ear fullness.  I have placed a referral for patient to follow with ENT in [place] as requested.  Patient cannot do trial of Flonase as she thinks she has a reaction to Flonase in the past.

## 2023-01-26 NOTE — Assessment & Plan Note (Signed)
-  Zyrtec daily -CXR -If cough persists after 1 month, will D/C lisinopril and start losartan

## 2023-01-28 DIAGNOSIS — R196 Halitosis: Secondary | ICD-10-CM | POA: Diagnosis not present

## 2023-01-28 DIAGNOSIS — K115 Sialolithiasis: Secondary | ICD-10-CM | POA: Diagnosis not present

## 2023-02-08 DIAGNOSIS — Z1231 Encounter for screening mammogram for malignant neoplasm of breast: Secondary | ICD-10-CM | POA: Diagnosis not present

## 2023-02-08 DIAGNOSIS — R3915 Urgency of urination: Secondary | ICD-10-CM | POA: Diagnosis not present

## 2023-02-22 ENCOUNTER — Other Ambulatory Visit: Payer: Self-pay

## 2023-02-22 ENCOUNTER — Encounter: Payer: Self-pay | Admitting: Student

## 2023-02-22 ENCOUNTER — Ambulatory Visit (INDEPENDENT_AMBULATORY_CARE_PROVIDER_SITE_OTHER): Payer: 59 | Admitting: Student

## 2023-02-22 VITALS — BP 136/78 | HR 66 | Ht 66.0 in | Wt 252.4 lb

## 2023-02-22 DIAGNOSIS — L918 Other hypertrophic disorders of the skin: Secondary | ICD-10-CM

## 2023-02-22 DIAGNOSIS — I10 Essential (primary) hypertension: Secondary | ICD-10-CM | POA: Diagnosis not present

## 2023-02-22 DIAGNOSIS — Z13820 Encounter for screening for osteoporosis: Secondary | ICD-10-CM

## 2023-02-22 NOTE — Progress Notes (Unsigned)
    SUBJECTIVE:   CHIEF COMPLAINT / HPI:   Cough Has been taking Zyrtec prn since last month. Cough is completely resolved.   Health maintenacne Vaccines? Declines all vaccines Dexa  HTN Still taking lisinopril and HCTZ. Does not check BP at home. No blurry vision, headache, CP, or SOB  Bump on neck Present for many years. Seen for this in 2022. Has not grown. Does not bother her, no itching or irritation. Sometimes seems harder than other times. Was previously thought to be ingrown hair but it never resolved.    PERTINENT  PMH / PSH: ***  OBJECTIVE:   Vitals:   02/22/23 0843  BP: (!) 149/87  Pulse: 66  SpO2: 95%     General: NAD, pleasant, able to participate in exam Cardiac: RRR, no murmurs. Respiratory: CTAB, normal effort, No wheezes, rales or rhonchi Abdomen: Bowel sounds present, nontender, nondistended, no hepatosplenomegaly. Extremities: no edema or cyanosis. Skin: warm and dry, no rashes noted Neuro: alert, no obvious focal deficits Psych: Normal affect and mood  ASSESSMENT/PLAN:   No problem-specific Assessment & Plan notes found for this encounter.     Dr. Precious Gilding, [place]    {    This will disappear when note is signed, click to select method of visit    :1}

## 2023-02-22 NOTE — Patient Instructions (Addendum)
It was great to see you! Thank you for allowing me to participate in your care!  Our plans for today:  - Call the breast center at [phone] to schedule DEXA scan - Return for blood pressure check in 6 months or sooner if needed  Take care and seek immediate care sooner if you develop any concerns.   Dr. Precious Gilding, DO [facility]

## 2023-02-23 DIAGNOSIS — L918 Other hypertrophic disorders of the skin: Secondary | ICD-10-CM | POA: Insufficient documentation

## 2023-02-23 NOTE — Assessment & Plan Note (Signed)
Well-controlled, continue current regimen. 

## 2023-02-23 NOTE — Assessment & Plan Note (Signed)
This skin lesion has been present for many years and unchanged from last photo in 2022. It is very soft and feels/looks like a skin tag. No concerning features for malignancy. Pt advised if she would like it removed to let me know and we can get her scheduled in derm clinic.

## 2023-02-24 DIAGNOSIS — M67912 Unspecified disorder of synovium and tendon, left shoulder: Secondary | ICD-10-CM | POA: Diagnosis not present

## 2023-02-24 DIAGNOSIS — M25512 Pain in left shoulder: Secondary | ICD-10-CM | POA: Diagnosis not present

## 2023-02-25 DIAGNOSIS — M47816 Spondylosis without myelopathy or radiculopathy, lumbar region: Secondary | ICD-10-CM | POA: Diagnosis not present

## 2023-02-28 ENCOUNTER — Other Ambulatory Visit: Payer: Self-pay | Admitting: Student

## 2023-02-28 DIAGNOSIS — R112 Nausea with vomiting, unspecified: Secondary | ICD-10-CM

## 2023-03-02 DIAGNOSIS — M47816 Spondylosis without myelopathy or radiculopathy, lumbar region: Secondary | ICD-10-CM | POA: Diagnosis not present

## 2023-03-03 ENCOUNTER — Other Ambulatory Visit: Payer: Self-pay | Admitting: Student

## 2023-03-03 DIAGNOSIS — I10 Essential (primary) hypertension: Secondary | ICD-10-CM

## 2023-04-01 DIAGNOSIS — H40013 Open angle with borderline findings, low risk, bilateral: Secondary | ICD-10-CM | POA: Diagnosis not present

## 2023-04-14 DIAGNOSIS — M9901 Segmental and somatic dysfunction of cervical region: Secondary | ICD-10-CM | POA: Diagnosis not present

## 2023-04-14 DIAGNOSIS — M5383 Other specified dorsopathies, cervicothoracic region: Secondary | ICD-10-CM | POA: Diagnosis not present

## 2023-04-18 ENCOUNTER — Other Ambulatory Visit: Payer: Self-pay | Admitting: Student

## 2023-04-18 DIAGNOSIS — K219 Gastro-esophageal reflux disease without esophagitis: Secondary | ICD-10-CM

## 2023-04-26 DIAGNOSIS — M9901 Segmental and somatic dysfunction of cervical region: Secondary | ICD-10-CM | POA: Diagnosis not present

## 2023-04-26 DIAGNOSIS — M5383 Other specified dorsopathies, cervicothoracic region: Secondary | ICD-10-CM | POA: Diagnosis not present

## 2023-04-27 NOTE — Patient Instructions (Signed)

## 2023-04-27 NOTE — Progress Notes (Addendum)
I connected with  Roberta [name] on 04/28/2023 by a audio enabled telemedicine application and verified that I am speaking with the correct person using two identifiers.  Patient Location: Home  Provider Location: Home Office  I discussed the limitations of evaluation and management by telemedicine. The patient expressed understanding and agreed to proceed.   Subjective:   Roberta [name] is a 67 y.o. female who presents for Medicare Annual (Subsequent) preventive examination.  Review of Systems    Per HPI unless specifically indicated below.  Cardiac Risk Factors include: advanced age (>36men, >18 women);female gender, Hypertension, and Hyperlipidemia.          Objective:       02/22/2023    9:05 AM 02/22/2023    8:43 AM 01/25/2023    2:41 PM  Vitals with BMI  Height  5\' 6"    Weight  252 lbs 6 oz   BMI  40.76   Systolic 136 149 161  Diastolic 78 87 82  Pulse  66     There were no vitals filed for this visit. There is no height or weight on file to calculate BMI.     04/28/2023    9:28 PM 04/28/2023   10:36 AM 02/22/2023    8:44 AM 01/25/2023    1:53 PM 12/01/2022   10:03 AM 11/15/2022    9:43 AM 11/02/2022    3:04 PM  Advanced Directives  Does Patient Have a Medical Advance Directive? Yes Yes No Yes No No No  Type of Advance Directive    Living will;Healthcare Power of Attorney     Copy of Healthcare Power of Attorney in Chart?    No - copy requested     Would patient like information on creating a medical advance directive?   No - Patient declined No - Guardian declined No - Patient declined No - Patient declined No - Patient declined    Current Medications (verified) Outpatient Encounter Medications as of 04/28/2023  Medication Sig   albuterol (PROAIR HFA) 108 (90 Base) MCG/ACT inhaler Inhale 2 puffs into the lungs every 4 (four) hours as needed.   albuterol (PROVENTIL) (2.5 MG/3ML) 0.083% nebulizer solution Take 3 mLs (2.5 mg total) by nebulization every 6 (six)  hours as needed for wheezing or shortness of breath.   aspirin EC 81 MG tablet Take 1 tablet (81 mg total) by mouth daily. Swallow whole. (Patient taking differently: Take 81 mg by mouth every other day. Swallow whole.)   cetirizine (ZYRTEC) 10 MG tablet Take 1 tablet (10 mg total) by mouth daily.   cyclobenzaprine (FLEXERIL) 10 MG tablet Take 10 mg daily as needed by mouth for muscle spasms.   dorzolamide-timolol (COSOPT) 22.3-6.8 MG/ML ophthalmic solution Place 1 drop into both eyes 2 (two) times daily.   esomeprazole (NEXIUM) 20 MG capsule TAKE 1 CAPSULE(20 MG) BY MOUTH DAILY AS NEEDED FOR STOMACH ACID OR REFLUX   hydrochlorothiazide (HYDRODIURIL) 12.5 MG tablet TAKE 1 TABLET(12.5 MG) BY MOUTH DAILY AS NEEDED FOR SWELLING   lisinopril (ZESTRIL) 30 MG tablet TAKE 1 TABLET(30 MG) BY MOUTH AT BEDTIME   LORazepam (ATIVAN) 0.5 MG tablet Take 1 tablet (0.5 mg total) by mouth daily as needed for anxiety (airplane ride).   meclizine (ANTIVERT) 12.5 MG tablet Take 1 tablet (12.5 mg total) by mouth 3 (three) times daily as needed.   Multiple Vitamins-Minerals (HAIR SKIN & NAILS ADVANCED PO) Take by mouth.   ondansetron (ZOFRAN) 4 MG tablet TAKE 1 TABLET  BY MOUTH EVERY 8 HOURS AS NEEDED FOR NAUSEA OR VOMITING   traMADol (ULTRAM) 50 MG tablet Take 50 mg daily as needed by mouth for moderate pain.   Travoprost, BAK Free, (TRAVATAN) 0.004 % SOLN ophthalmic solution Place 1 drop into both eyes at bedtime.   No facility-administered encounter medications on file as of 04/28/2023.    Allergies (verified) Influenza vaccines, Budesonide, Flonase [fluticasone propionate], Hydrocodone-acetaminophen, Codeine, Morphine and related, Nickel, and Phenobarbital   History: Past Medical History:  Diagnosis Date   Abnormal EKG    nonspecific ST and T-wave changes - Followed by Dr.Berry   ALLERGIC RHINITIS 10/14/2010   Anemia    Asthma    ASTHMA, INTERMITTENT 02/16/2007   BACK PAIN, CHRONIC 03/25/2009   CERVICAL  RADICULOPATHY 10/16/2009   DDD (degenerative disc disease), lumbosacral    Depression    DEPRESSIVE DISORDER NOT ELSEWHERE CLASSIFIED 03/25/2009   Endometrial cancer (HCC)    GERD (gastroesophageal reflux disease) 09/09/2011   Glaucoma    sees optho every 3 months, drops each night   Hyperlipidemia    Hypertension    PONV (postoperative nausea and vomiting)    Postmenopausal vaginal bleeding 09/24/2014   Viral URI with cough 03/28/2013   Past Surgical History:  Procedure Laterality Date   ANKLE SURGERY     BIOPSY BREAST     LEFT   BREAST CYST INCISION AND DRAINAGE     under breast   BUNIONECTOMY     bilateral and toe correction   CARPAL TUNNEL RELEASE Bilateral 2009   CATARACT SURGERY     CERVICAL SPINE SURGERY     CHOLECYSTECTOMY  1980   DILATATION & CURRETTAGE/HYSTEROSCOPY WITH RESECTOCOPE N/A 07/28/2015   Procedure: DILATATION & CURETTAGE/HYSTEROSCOPY WITH RESECTOCOPE;  Surgeon: Hal Morales, MD;  Location: [facility];  Service: Gynecology;  Laterality: N/A;   KNEE ARTHROSCOPY     ROBOTIC ASSISTED TOTAL HYSTERECTOMY WITH BILATERAL SALPINGO OOPHERECTOMY Bilateral 09/04/2015   Procedure: ROBOTIC ASSISTED HYSTERECTOMY WITH BILATERAL SALPINGO OOPHORECTOMY SENTINAL NODE MAPPING ;  Surgeon: Adolphus Birchwood, MD;  Location: [facility];  Service: Gynecology;  Laterality: Bilateral;   ROTATOR CUFF REPAIR Right May 2014   Family History  Problem Relation Age of Onset   Lymphoma Mother        related to asbestos   Cancer Mother        lymphoma (asbestos exposure)   Alcoholism Father    Cirrhosis Father        due to alcohol   Asthma Father    Cancer Brother    Cancer Maternal Uncle        lung   Cancer Maternal Uncle        lung   Social History   Socioeconomic History   Marital status: Divorced    Spouse name: Not on file   Number of children: 4   Years of education: 16   Highest education level: Bachelor's degree (e.g., BA, AB, BS)  Occupational History   Occupation: DISABLED     Employer: UNEMPLOYED   Occupation: Retired    Comment: Arts administrator- troubleshoots  Tobacco Use   Smoking status: Never    Passive exposure: Never   Smokeless tobacco: Never  Vaping Use   Vaping Use: Never used  Substance and Sexual Activity   Alcohol use: No    Alcohol/week: 0.0 standard drinks of alcohol   Drug use: No   Sexual activity: Not Currently  Other Topics Concern   Not on file  Social History  Narrative   Patient lives alone in [place].   Patient is very close with her 4 grown children and her grandchildren.   Patient enjoys puzzles, building Lego cities, crafts, knitting blankets, and bible studies at home.    Patient doesn't eat pork and eats very little red meat          Social Determinants of Health   Financial Resource Strain: Medium Risk (04/28/2023)   Overall Financial Resource Strain (CARDIA)    Difficulty of Paying Living Expenses: Somewhat hard  Food Insecurity: No Food Insecurity (04/28/2023)   Hunger Vital Sign    Worried About Running Out of Food in the Last Year: Never true    Ran Out of Food in the Last Year: Never true  Transportation Needs: No Transportation Needs (04/28/2023)   PRAPARE - Administrator, Civil Service (Medical): No    Lack of Transportation (Non-Medical): No  Physical Activity: Sufficiently Active (04/28/2023)   Exercise Vital Sign    Days of Exercise per Week: 7 days    Minutes of Exercise per Session: 30 min  Stress: No Stress Concern Present (04/28/2023)   [employer] of Occupational Health - Occupational Stress Questionnaire    Feeling of Stress : Not at all  Social Connections: Moderately Isolated (04/28/2023)   Social Connection and Isolation Panel [NHANES]    Frequency of Communication with Friends and Family: More than three times a week    Frequency of Social Gatherings with Friends and Family: Twice a week    Attends Religious Services: More than 4 times per year    Active Member of [employer] or  Organizations: No    Attends Engineer, structural: Never    Marital Status: Divorced    Tobacco Counseling Counseling given: No   Clinical Intake:  Pre-visit preparation completed: No  Pain : No/denies pain     Nutritional Status: BMI > 30  Obese Nutritional Risks: Nausea/ vomitting/ diarrhea Diabetes: No  How often do you need to have someone help you when you read instructions, pamphlets, or other written materials from your doctor or pharmacy?: 1 - Never  Diabetic?No  Interpreter Needed?: No  Information entered by :: Laurel Dimmer, CMA   Activities of Daily Living    04/28/2023    9:47 AM  In your present state of health, do you have any difficulty performing the following activities:  Hearing? 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 Bilateral hearing aids  Vision? 0  Comment Dr. Lorin Picket, [place]  Difficulty concentrating or making decisions? 0  Walking or climbing stairs? 0  Dressing or bathing? 0  Doing errands, shopping? 0    Patient Care Team: Erick Alley, DO as PCP - General (Family Medicine) Jethro Bolus, MD (Ophthalmology) Aquilla Hacker, PA-C as Physician Assistant (Otolaryngology) Charlott Rakes, MD as Consulting Physician (Gastroenterology)  Indicate any recent Medical Services you may have received from other than [facility] providers in the past year (date may be approximate).     Assessment:   This is a routine wellness examination for Jancie.   Hearing/Vision screen Denies any hearing issues. Denies any change to her vision. Wear glasses. Annual Eye Exam.   Dietary issues and exercise activities discussed:     Goals Addressed   None    Depression Screen    04/28/2023    9:46 AM 01/25/2023    1:53 PM 12/01/2022   10:03 AM 11/15/2022    9:41 AM 11/02/2022  3:03 PM 08/10/2022    4:04 PM 04/20/2022    4:35 PM  PHQ 2/9 Scores  PHQ - 2 Score 0 0  0 0 0 1  PHQ- 9 Score  1  0 0 1 3  Exception  Documentation   Patient refusal        Fall Risk    04/28/2023    9:46 AM 02/22/2023    8:44 AM 01/25/2023    1:53 PM 12/01/2022   10:03 AM 11/15/2022    9:41 AM  Fall Risk   Falls in the past year? 0 0 0 0 0  Number falls in past yr: 0 0 0 0 0  Injury with Fall? 0 0 0 0 0  Risk for fall due to : No Fall Risks      Follow up Falls evaluation completed  Falls evaluation completed      FALL RISK PREVENTION PERTAINING TO THE HOME:  Any stairs in or around the home? No  If so, are there any without handrails? No  Home free of loose throw rugs in walkways, pet beds, electrical cords, etc? Yes  Adequate lighting in your home to reduce risk of falls? Yes   ASSISTIVE DEVICES UTILIZED TO PREVENT FALLS:  Life alert? No  Use of a cane, walker or w/c? No  Grab bars in the bathroom? Yes  Shower chair or bench in shower? No  Elevated toilet seat or a handicapped toilet? No   TIMED UP AND GO:  Was the test performed?Unable to perform, virtual appointment   Cognitive Function:    04/24/2014    4:00 PM  MMSE - Mini Mental State Exam  Orientation to time 5  Orientation to Place 5  Registration 3  Attention/ Calculation 4  Recall 3  Language- name 2 objects 2  Language- repeat 1  Language- follow 3 step command 3  Language- read & follow direction 1  Write a sentence 1  Copy design 1  Total score 29        04/28/2023    9:55 AM 03/04/2022    2:40 PM 10/23/2020    2:06 PM  6CIT Screen  What Year? 0 points 0 points 0 points  What month? 0 points 0 points 0 points  What time? 0 points 0 points 0 points  Count back from 20 0 points 0 points 0 points  Months in reverse 0 points 0 points 0 points  Repeat phrase 0 points 0 points 0 points  Total Score 0 points 0 points 0 points    Immunizations Immunization History  Administered Date(s) Administered   Influenza Whole 10/30/2007, 10/02/2008, 10/05/2010   MMR [birth year]19/1959, 04/22/1957, 04/29/1957   PFIZER Comirnaty(Gray Top)Covid-19  Tri-Sucrose Vaccine 05/19/2021   PFIZER(Purple Top)SARS-COV-2 Vaccination 11/24/2020, 12/15/2020   Td 12/20/2000   Tdap 09/19/2017, 04/09/2019   Varicella 04/19/1957, 04/22/1957, 04/29/1957    TDAP status: Up to date  Flu Vaccine status: Up to date  Pneumococcal vaccine status: Due, Education has been provided regarding the importance of this vaccine. Advised may receive this vaccine at local pharmacy or Health Dept. Aware to provide a copy of the vaccination record if obtained from local pharmacy or Health Dept. Verbalized acceptance and understanding.  Covid-19 vaccine status: Information provided on how to obtain vaccines.   Qualifies for Shingles Vaccine? Yes   Zostavax completed No   Shingrix Completed?: No.    Education has been provided regarding the importance of this vaccine. Patient has been advised to call  insurance company to determine out of pocket expense if they have not yet received this vaccine. Advised may also receive vaccine at local pharmacy or Health Dept. Verbalized acceptance and understanding.  Screening Tests Health Maintenance  Topic Date Due   Zoster Vaccines- Shingrix (1 of 2) Never done   Pneumonia Vaccine 39+ Years old (1 of 1 - PCV) Never done   DEXA SCAN  Never done   COVID-19 Vaccine (4 - 2023-24 season) 08/20/2022   Medicare Annual Wellness (AWV)  04/27/2024   COLONOSCOPY (Pts 45-66yrs Insurance coverage will need to be confirmed)  01/20/2025   MAMMOGRAM  01/28/2025   DTaP/Tdap/Td (4 - Td or Tdap) 04/08/2029   Hepatitis C Screening  Completed   HPV VACCINES  Aged Out    Health Maintenance  Health Maintenance Due  Topic Date Due   Zoster Vaccines- Shingrix (1 of 2) Never done   Pneumonia Vaccine 52+ Years old (1 of 1 - PCV) Never done   DEXA SCAN  Never done   COVID-19 Vaccine (4 - 2023-24 season) 08/20/2022    Colorectal cancer screening: Type of screening: Colonoscopy. Completed 01/20/2022. Repeat every 3 years  Mammogram: completed  01/28/2023, [facility]    Bone Density status: Ordered 09/01/2023. Pt provided with contact info and advised to call to schedule appt.  Lung Cancer Screening: (Low Dose CT Chest recommended if Age 30-80 years, 30 pack-year currently smoking OR have quit w/in 15years.) does not qualify.   Lung Cancer Screening Referral: not applicable   Additional Screening:  Hepatitis C Screening: does qualify; Completed 08/31/2013  Vision Screening: Recommended annual ophthalmology exams for early detection of glaucoma and other disorders of the eye. Is the patient up to date with their annual eye exam?  Yes  Who is the provider or what is the name of the office in which the patient attends annual eye exams?  If pt is not established with a provider, would they like to be referred to a provider to establish care? No .   Dental Screening: Recommended annual dental exams for proper oral hygiene  Community Resource Referral / Chronic Care Management: CRR required this visit?  No   CCM required this visit?  No      Plan:     I have personally reviewed and noted the following in the patient's chart:   Medical and social history Use of alcohol, tobacco or illicit drugs  Current medications and supplements including opioid prescriptions. Patient is not currently taking opioid prescriptions. Functional ability and status Nutritional status Physical activity Advanced directives List of other physicians Hospitalizations, surgeries, and ER visits in previous 12 months Vitals Screenings to include cognitive, depression, and falls Referrals and appointments  In addition, I have reviewed and discussed with patient certain preventive protocols, quality metrics, and best practice recommendations. A written personalized care plan for preventive services as well as general preventive health recommendations were provided to patient.     Lonna Cobb, CMA   04/28/2023  Nurse Notes:  Approximately 30 minute Non-Face -To-Face Medicare Wellness Visit

## 2023-04-28 ENCOUNTER — Ambulatory Visit (INDEPENDENT_AMBULATORY_CARE_PROVIDER_SITE_OTHER): Payer: 59

## 2023-04-28 DIAGNOSIS — Z0000 Encounter for general adult medical examination without abnormal findings: Secondary | ICD-10-CM | POA: Diagnosis not present

## 2023-05-02 ENCOUNTER — Encounter: Payer: Self-pay | Admitting: Student

## 2023-05-02 ENCOUNTER — Ambulatory Visit (INDEPENDENT_AMBULATORY_CARE_PROVIDER_SITE_OTHER): Payer: 59 | Admitting: Student

## 2023-05-02 VITALS — BP 124/76 | HR 77 | Ht 66.0 in | Wt 249.0 lb

## 2023-05-02 DIAGNOSIS — R7303 Prediabetes: Secondary | ICD-10-CM

## 2023-05-02 LAB — POCT GLYCOSYLATED HEMOGLOBIN (HGB A1C): HbA1c, POC (prediabetic range): 6.4 % (ref 5.7–6.4)

## 2023-05-02 NOTE — Progress Notes (Deleted)
    SUBJECTIVE:   CHIEF COMPLAINT / HPI:   ***  PERTINENT  PMH / PSH: ***  OBJECTIVE:   BP 124/76   Pulse 77   Ht 5\' 6"  (1.676 m)   Wt 249 lb (112.9 kg)   SpO2 98%   BMI 40.19 kg/m  ***  General: NAD, pleasant, able to participate in exam Cardiac: RRR, no murmurs. Respiratory: CTAB, normal effort, No wheezes, rales or rhonchi Abdomen: Bowel sounds present, nontender, nondistended, no hepatosplenomegaly. Extremities: no edema or cyanosis. Skin: warm and dry, no rashes noted Neuro: alert, no obvious focal deficits Psych: Normal affect and mood  ASSESSMENT/PLAN:   No problem-specific Assessment & Plan notes found for this encounter.     Dr. Erick Alley, DO [facility]    {    This will disappear when note is signed, click to select method of visit    :1}

## 2023-05-02 NOTE — Patient Instructions (Signed)
It was great to see you! Thank you for allowing me to participate in your care!  -Return in about 3-4 months to check in or sooner if needed  We are checking some labs today, I will call you if they are abnormal will send you a MyChart message or a letter if they are normal.  If you do not hear about your labs in the next 2 weeks please let us know.  Take care and seek immediate care sooner if you develop any concerns.   Dr. Erick Alley, DO [facility]

## 2023-05-02 NOTE — Progress Notes (Cosign Needed Addendum)
    SUBJECTIVE:   CHIEF COMPLAINT / HPI:   Roberta [name] is a 67 yo female who presents to clinic for an A1c check. She started a workout plan recently to help prevent diabetes (has hx of pre-diabetes). Her last A1c was 6.0 on 11/02/22.  She does not offer any symptoms or complaints but just wants to see how her workout plan is affecting her A1c.  PERTINENT  PMH / PSH: HTN, Asthma, obesity  OBJECTIVE:   BP 124/76   Pulse 77   Ht 5\' 6"  (1.676 m)   Wt 249 lb (112.9 kg)   SpO2 98%   BMI 40.19 kg/m    General: NAD, pleasant, able to participate in exam Cardiac: Well perfused Respiratory: breathing comfortably on RA Neuro: alert, no obvious focal deficits Psych: Normal affect and mood  ASSESSMENT/PLAN:   Pre-diabetes Last A1c was 6.0 on 11/02/22. She is on a workout plan to prevent diabetes and wanted to see how it was affecting her A1c. Today her A1c was 6.4. She is still in the prediabetic range and weight remains stable over the past several years. Continue to eat a healthy diet and exercise. Follow up as needed.  Pt to return in 3-4 months for blood pressure check/BMP   Randell Patient, MS-III  I was personally present and re-performed the exam and medical decision making and verified the service and findings are accurately documented in the student's note.  Erick Alley, DO 05/02/2023 3:12 PM

## 2023-05-02 NOTE — Assessment & Plan Note (Signed)
Last A1c was 6.0 on 11/02/22. She is on a workout plan to prevent diabetes and wanted to see how it was affecting her A1c. Today her A1c was 6.4. She is still in the prediabetic range and weight remains stable over the past several years. Continue to eat a healthy diet and exercise. Follow up as needed.

## 2023-05-03 ENCOUNTER — Encounter: Payer: Self-pay | Admitting: Student

## 2023-05-03 ENCOUNTER — Encounter (INDEPENDENT_AMBULATORY_CARE_PROVIDER_SITE_OTHER): Payer: Self-pay

## 2023-07-12 ENCOUNTER — Emergency Department (HOSPITAL_COMMUNITY): Payer: 59

## 2023-07-12 ENCOUNTER — Encounter (HOSPITAL_COMMUNITY): Payer: Self-pay

## 2023-07-12 ENCOUNTER — Emergency Department (HOSPITAL_COMMUNITY)
Admission: EM | Admit: 2023-07-12 | Discharge: 2023-07-13 | Disposition: A | Discharge status: Home / Self Care | Payer: 59 | Attending: Emergency Medicine | Admitting: Emergency Medicine

## 2023-07-12 ENCOUNTER — Other Ambulatory Visit: Payer: Self-pay

## 2023-07-12 DIAGNOSIS — S90112A Contusion of left great toe without damage to nail, initial encounter: Secondary | ICD-10-CM | POA: Insufficient documentation

## 2023-07-12 DIAGNOSIS — Z7982 Long term (current) use of aspirin: Secondary | ICD-10-CM | POA: Insufficient documentation

## 2023-07-12 DIAGNOSIS — S99922A Unspecified injury of left foot, initial encounter: Secondary | ICD-10-CM | POA: Diagnosis not present

## 2023-07-12 DIAGNOSIS — Z7901 Long term (current) use of anticoagulants: Secondary | ICD-10-CM | POA: Insufficient documentation

## 2023-07-12 DIAGNOSIS — I10 Essential (primary) hypertension: Secondary | ICD-10-CM | POA: Diagnosis not present

## 2023-07-12 DIAGNOSIS — M79675 Pain in left toe(s): Secondary | ICD-10-CM | POA: Diagnosis present

## 2023-07-12 DIAGNOSIS — W208XXA Other cause of strike by thrown, projected or falling object, initial encounter: Secondary | ICD-10-CM | POA: Insufficient documentation

## 2023-07-12 DIAGNOSIS — J45909 Unspecified asthma, uncomplicated: Secondary | ICD-10-CM | POA: Diagnosis not present

## 2023-07-12 NOTE — ED Notes (Signed)
X-ray at bedside

## 2023-07-12 NOTE — ED Triage Notes (Addendum)
Pt states she dropped a frozen metal container on her left 1st toe about 30 minutes ago.  Pt reports difficulty walking & putting weight on that foot now.

## 2023-07-13 DIAGNOSIS — S90112A Contusion of left great toe without damage to nail, initial encounter: Secondary | ICD-10-CM | POA: Diagnosis not present

## 2023-07-13 MED ORDER — IBUPROFEN 800 MG PO TABS
800.0000 mg | ORAL_TABLET | Freq: Once | ORAL | Status: AC
  Administered 2023-07-13: 800 mg via ORAL
  Filled 2023-07-13: qty 1

## 2023-07-13 NOTE — ED Provider Notes (Signed)
[address] EMERGENCY DEPARTMENT AT [facility] Provider Note   CSN: [id number] Arrival date & time: 07/12/23  2130     History  Chief Complaint  Patient presents with   Toe Injury    Roberta [name] is a 67 y.o. female.  Patient with a history of asthma, GERD, hypertension, previous DVT milligram anticoagulation presenting with left great toe pain.  States she was taking a metal container full of tea out of her freezer this morning and dropped it on her left foot.  Was not wearing a shoe at the time.  This landed on her left first and second toes.  No breaks in skin.  Was able to walk on it all day but became progressively more sore despite taking Ultram at home which she takes for her back.  Denies any other injury.  No fall to the ground.  No neck or back pain.  No headache.  No chest pain or shortness of breath.  No nausea or vomiting, cough or fever.  No hip, knee, ankle pain.  No focal weakness, numbness or tingling.  The history is provided by the patient.       Home Medications Prior to Admission medications   Medication Sig Start Date End Date Taking? Authorizing Provider  albuterol (PROAIR HFA) 108 (90 Base) MCG/ACT inhaler Inhale 2 puffs into the lungs every 4 (four) hours as needed. 05/04/22   Autry-Lott, Randa Evens, DO  albuterol (PROVENTIL) (2.5 MG/3ML) 0.083% nebulizer solution Take 3 mLs (2.5 mg total) by nebulization every 6 (six) hours as needed for wheezing or shortness of breath. 04/03/21   Simmons-Robinson, Tawanna Cooler, MD  aspirin EC 81 MG tablet Take 1 tablet (81 mg total) by mouth daily. Swallow whole. Patient taking differently: Take 81 mg by mouth every other day. Swallow whole. 11/02/22   Erick Alley, DO  cetirizine (ZYRTEC) 10 MG tablet Take 1 tablet (10 mg total) by mouth daily. 01/25/23   Erick Alley, DO  cyclobenzaprine (FLEXERIL) 10 MG tablet Take 10 mg daily as needed by mouth for muscle spasms.    [provider]  dorzolamide-timolol  (COSOPT) 22.3-6.8 MG/ML ophthalmic solution Place 1 drop into both eyes 2 (two) times daily. 10/05/17   Renne Musca, MD  esomeprazole (NEXIUM) 20 MG capsule TAKE 1 CAPSULE(20 MG) BY MOUTH DAILY AS NEEDED FOR STOMACH ACID OR REFLUX 04/19/23   Erick Alley, DO  hydrochlorothiazide (HYDRODIURIL) 12.5 MG tablet TAKE 1 TABLET(12.5 MG) BY MOUTH DAILY AS NEEDED FOR SWELLING 01/10/23   Erick Alley, DO  lisinopril (ZESTRIL) 30 MG tablet TAKE 1 TABLET(30 MG) BY MOUTH AT BEDTIME 03/03/23   Erick Alley, DO  LORazepam (ATIVAN) 0.5 MG tablet Take 1 tablet (0.5 mg total) by mouth daily as needed for anxiety (airplane ride). 05/19/21   Littie Deeds, MD  meclizine (ANTIVERT) 12.5 MG tablet Take 1 tablet (12.5 mg total) by mouth 3 (three) times daily as needed. 08/19/22   Erick Alley, DO  Multiple Vitamins-Minerals (HAIR SKIN & NAILS ADVANCED PO) Take by mouth.    [provider]  ondansetron (ZOFRAN) 4 MG tablet TAKE 1 TABLET BY MOUTH EVERY 8 HOURS AS NEEDED FOR NAUSEA OR VOMITING Patient not taking: Reported on 05/02/2023 02/28/23   Erick Alley, DO  traMADol (ULTRAM) 50 MG tablet Take 50 mg daily as needed by mouth for moderate pain.    [provider]  Travoprost, BAK Free, (TRAVATAN) 0.004 % SOLN ophthalmic solution Place 1 drop into both eyes at bedtime. 10/23/14  Charlane Ferretti, MD      Allergies    Influenza vaccines, Budesonide, Flonase [fluticasone propionate], Hydrocodone-acetaminophen, Codeine, Morphine and codeine, Nickel, and Phenobarbital    Review of Systems   Review of Systems  Constitutional:  Negative for activity change, appetite change and fever.  HENT:  Negative for congestion and rhinorrhea.   Cardiovascular:  Negative for chest pain.  Gastrointestinal:  Negative for abdominal pain, nausea and vomiting.  Musculoskeletal:  Positive for arthralgias and myalgias.  Skin:  Negative for rash.  Neurological:  Negative for dizziness, weakness and headaches.   all other  systems are negative except as noted in the HPI and PMH.    Physical Exam Updated Vital Signs BP (!) 157/93 (BP Location: Left Arm)   Pulse 76   Temp 98.4 F (36.9 C) (Oral)   Resp 16   Ht 5\' 6"  (1.676 m)   Wt 112.5 kg   SpO2 99%   BMI 40.03 kg/m  Physical Exam Vitals and nursing note reviewed.  Constitutional:      General: She is not in acute distress.    Appearance: She is well-developed.  HENT:     Head: Normocephalic and atraumatic.     Mouth/Throat:     Pharynx: No oropharyngeal exudate.  Eyes:     Conjunctiva/sclera: Conjunctivae normal.     Pupils: Pupils are equal, round, and reactive to light.  Neck:     Comments: No meningismus. Cardiovascular:     Rate and Rhythm: Normal rate and regular rhythm.     Heart sounds: Normal heart sounds. No murmur heard. Pulmonary:     Effort: Pulmonary effort is normal. No respiratory distress.     Breath sounds: Normal breath sounds.  Abdominal:     Palpations: Abdomen is soft.     Tenderness: There is no abdominal tenderness. There is no guarding or rebound.  Musculoskeletal:        General: Tenderness present. Normal range of motion.     Cervical back: Normal range of motion and neck supple.     Comments: Tenderness to left great toe without deformity.  No break in skin.  Intact DP and PT pulse.  No pain with palpation of medial or lateral malleoli.  Skin:    General: Skin is warm.  Neurological:     Mental Status: She is alert and oriented to person, place, and time.     Cranial Nerves: No cranial nerve deficit.     Motor: No abnormal muscle tone.     Coordination: Coordination normal.     Comments:  5/5 strength throughout. CN 2-12 intact.Equal grip strength.   Psychiatric:        Behavior: Behavior normal.     ED Results / Procedures / Treatments   Labs (all labs ordered are listed, but only abnormal results are displayed) Labs Reviewed - No data to display  EKG None  Radiology DG Toe Great Left  Result  Date: 07/12/2023 CLINICAL DATA:  Injury EXAM: LEFT GREAT TOE COMPARISON:  Left foot x-ray 11/24/2022 FINDINGS: There is no evidence of fracture or dislocation. There are postsurgical changes of the first metatarsal, uncomplicated. Soft tissues are unremarkable. IMPRESSION: 1. No acute fracture or dislocation. 2. Postsurgical changes of the first metatarsal. Electronically Signed   By: Darliss Cheney M.D.   On: 07/12/2023 22:36    Procedures Procedures    Medications Ordered in ED Medications  ibuprofen (ADVIL) tablet 800 mg (has no administration in time range)  ED Course/ Medical Decision Making/ A&P                             Medical Decision Making Amount and/or Complexity of Data Reviewed Labs: ordered. Decision-making details documented in ED Course. Radiology: ordered and independent interpretation performed. Decision-making details documented in ED Course. ECG/medicine tests: ordered and independent interpretation performed. Decision-making details documented in ED Course.  Risk Prescription drug management.   Blunt toe injury.  No break in skin.  Did not hit head.  Vitals are stable, no distress.  Foot is neurovascularly intact.    X-ray obtained in triage is negative for acute traumatic injury.  Results reviewed interpreted by me.  Treat as contusion with hard soled shoe, pain control, ice and elevation.  Follow-up with her PCP as well as podiatry.  Return precautions discussed.        Final Clinical Impression(s) / ED Diagnoses Final diagnoses:  None    Rx / DC Orders ED Discharge Orders     None         Kandie Keiper, Jeannett Senior, MD 07/13/23 0006

## 2023-07-13 NOTE — ED Provider Notes (Incomplete)
[place] EMERGENCY DEPARTMENT AT [facility] Provider Note   CSN: [id number] Arrival date & time: 07/12/23  2130     History {Add pertinent medical, surgical, social history, OB history to HPI:1} Chief Complaint  Patient presents with  . Toe Injury    Roberta [name] is a 67 y.o. female.  HPI     Home Medications Prior to Admission medications   Medication Sig Start Date End Date Taking? Authorizing Provider  albuterol (PROAIR HFA) 108 (90 Base) MCG/ACT inhaler Inhale 2 puffs into the lungs every 4 (four) hours as needed. 05/04/22   Autry-Lott, Randa Evens, DO  albuterol (PROVENTIL) (2.5 MG/3ML) 0.083% nebulizer solution Take 3 mLs (2.5 mg total) by nebulization every 6 (six) hours as needed for wheezing or shortness of breath. 04/03/21   Simmons-Robinson, Tawanna Cooler, MD  aspirin EC 81 MG tablet Take 1 tablet (81 mg total) by mouth daily. Swallow whole. Patient taking differently: Take 81 mg by mouth every other day. Swallow whole. 11/02/22   Erick Alley, DO  cetirizine (ZYRTEC) 10 MG tablet Take 1 tablet (10 mg total) by mouth daily. 01/25/23   Erick Alley, DO  cyclobenzaprine (FLEXERIL) 10 MG tablet Take 10 mg daily as needed by mouth for muscle spasms.    [provider]  dorzolamide-timolol (COSOPT) 22.3-6.8 MG/ML ophthalmic solution Place 1 drop into both eyes 2 (two) times daily. 10/05/17   Renne Musca, MD  esomeprazole (NEXIUM) 20 MG capsule TAKE 1 CAPSULE(20 MG) BY MOUTH DAILY AS NEEDED FOR STOMACH ACID OR REFLUX 04/19/23   Erick Alley, DO  hydrochlorothiazide (HYDRODIURIL) 12.5 MG tablet TAKE 1 TABLET(12.5 MG) BY MOUTH DAILY AS NEEDED FOR SWELLING 01/10/23   Erick Alley, DO  lisinopril (ZESTRIL) 30 MG tablet TAKE 1 TABLET(30 MG) BY MOUTH AT BEDTIME 03/03/23   Erick Alley, DO  LORazepam (ATIVAN) 0.5 MG tablet Take 1 tablet (0.5 mg total) by mouth daily as needed for anxiety (airplane ride). 05/19/21   Littie Deeds, MD  meclizine (ANTIVERT) 12.5 MG tablet  Take 1 tablet (12.5 mg total) by mouth 3 (three) times daily as needed. 08/19/22   Erick Alley, DO  Multiple Vitamins-Minerals (HAIR SKIN & NAILS ADVANCED PO) Take by mouth.    [provider]  ondansetron (ZOFRAN) 4 MG tablet TAKE 1 TABLET BY MOUTH EVERY 8 HOURS AS NEEDED FOR NAUSEA OR VOMITING Patient not taking: Reported on 05/02/2023 02/28/23   Erick Alley, DO  traMADol (ULTRAM) 50 MG tablet Take 50 mg daily as needed by mouth for moderate pain.    [provider]  Travoprost, BAK Free, (TRAVATAN) 0.004 % SOLN ophthalmic solution Place 1 drop into both eyes at bedtime. 10/23/14   Charlane Ferretti, MD      Allergies    Influenza vaccines, Budesonide, Flonase [fluticasone propionate], Hydrocodone-acetaminophen, Codeine, Morphine and codeine, Nickel, and Phenobarbital    Review of Systems   Review of Systems  Physical Exam Updated Vital Signs BP (!) 157/93 (BP Location: Left Arm)   Pulse 76   Temp 98.4 F (36.9 C) (Oral)   Resp 16   Ht 5\' 6"  (1.676 m)   Wt 112.5 kg   SpO2 99%   BMI 40.03 kg/m  Physical Exam  ED Results / Procedures / Treatments   Labs (all labs ordered are listed, but only abnormal results are displayed) Labs Reviewed - No data to display  EKG None  Radiology DG Toe Great Left  Result Date: 07/12/2023 CLINICAL DATA:  Injury EXAM: LEFT  GREAT TOE COMPARISON:  Left foot x-ray 11/24/2022 FINDINGS: There is no evidence of fracture or dislocation. There are postsurgical changes of the first metatarsal, uncomplicated. Soft tissues are unremarkable. IMPRESSION: 1. No acute fracture or dislocation. 2. Postsurgical changes of the first metatarsal. Electronically Signed   By: Darliss Cheney M.D.   On: 07/12/2023 22:36    Procedures Procedures  {Document cardiac monitor, telemetry assessment procedure when appropriate:1}  Medications Ordered in ED Medications  ibuprofen (ADVIL) tablet 800 mg (has no administration in time range)    ED Course/  Medical Decision Making/ A&P   {   Click here for ABCD2, HEART and other calculatorsREFRESH Note before signing :1}                          Medical Decision Making Amount and/or Complexity of Data Reviewed Radiology: ordered.  Risk Prescription drug management.   ***  {Document critical care time when appropriate:1} {Document review of labs and clinical decision tools ie heart score, Chads2Vasc2 etc:1}  {Document your independent review of radiology images, and any outside records:1} {Document your discussion with family members, caretakers, and with consultants:1} {Document social determinants of health affecting pt's care:1} {Document your decision making why or why not admission, treatments were needed:1} Final Clinical Impression(s) / ED Diagnoses Final diagnoses:  None    Rx / DC Orders ED Discharge Orders     None

## 2023-07-13 NOTE — Discharge Instructions (Signed)
Your x-ray is negative for fracture.  Take your medication for pain as prescribed.  Keep foot elevated and apply ice.  Return to the ED with new or worsening symptoms.

## 2023-07-18 ENCOUNTER — Ambulatory Visit (INDEPENDENT_AMBULATORY_CARE_PROVIDER_SITE_OTHER): Payer: 59 | Admitting: Podiatry

## 2023-07-18 DIAGNOSIS — S90112D Contusion of left great toe without damage to nail, subsequent encounter: Secondary | ICD-10-CM | POA: Diagnosis not present

## 2023-07-18 DIAGNOSIS — R600 Localized edema: Secondary | ICD-10-CM

## 2023-07-18 NOTE — Progress Notes (Signed)
Chief Complaint  Patient presents with   Toe Injury    INJURY ON 07/12/23. DROPPED A METAL WATER CONTAINER ON IT. WENT TO [place] ED. PAIN LEVEL 0, SWELLING IS GOING DOWN, SHE IS ABLE TO MOVE THE TOE NOW. HAS TAKEN ANYTHING FOR PAIN, XRAYS ARE IN CHART FROM 07/12/23   HPI: 67 y.o. female presents today after being seen in the emergency department on 07/12/2023 for concern of injury to the left great toe.  She states that she dropped a 5 gallon water container on top of her left foot which caused significant immediate pain and swelling.  The emergency department performed x-rays which were negative for fracture.  They placed her in a surgical shoe.  She states that she has felt a little bit better since then.  She was referred here for follow-up  Past Medical History:  Diagnosis Date   Abnormal EKG    nonspecific ST and T-wave changes - Followed by Dr.Berry   ALLERGIC RHINITIS 10/14/2010   Anemia    Asthma    ASTHMA, INTERMITTENT 02/16/2007   BACK PAIN, CHRONIC 03/25/2009   CERVICAL RADICULOPATHY 10/16/2009   DDD (degenerative disc disease), lumbosacral    Depression    DEPRESSIVE DISORDER NOT ELSEWHERE CLASSIFIED 03/25/2009   Endometrial cancer (HCC)    GERD (gastroesophageal reflux disease) 09/09/2011   Glaucoma    sees optho every 3 months, drops each night   Hyperlipidemia    Hypertension    PONV (postoperative nausea and vomiting)    Postmenopausal vaginal bleeding 09/24/2014   Viral URI with cough 03/28/2013    Past Surgical History:  Procedure Laterality Date   ANKLE SURGERY     BIOPSY BREAST     LEFT   BREAST CYST INCISION AND DRAINAGE     under breast   BUNIONECTOMY     bilateral and toe correction   CARPAL TUNNEL RELEASE Bilateral 2009   CATARACT SURGERY     CERVICAL SPINE SURGERY     CHOLECYSTECTOMY  1980   DILATATION & CURRETTAGE/HYSTEROSCOPY WITH RESECTOCOPE N/A 07/28/2015   Procedure: DILATATION & CURETTAGE/HYSTEROSCOPY WITH RESECTOCOPE;  Surgeon:  Hal Morales, MD;  Location: [facility];  Service: Gynecology;  Laterality: N/A;   KNEE ARTHROSCOPY     ROBOTIC ASSISTED TOTAL HYSTERECTOMY WITH BILATERAL SALPINGO OOPHERECTOMY Bilateral 09/04/2015   Procedure: ROBOTIC ASSISTED HYSTERECTOMY WITH BILATERAL SALPINGO OOPHORECTOMY SENTINAL NODE MAPPING ;  Surgeon: Adolphus Birchwood, MD;  Location: [facility];  Service: Gynecology;  Laterality: Bilateral;   ROTATOR CUFF REPAIR Right May 2014    Allergies  Allergen Reactions   Influenza Vaccines Anaphylaxis    Throat swelling reported after flu shot in the past    Budesonide Other (See Comments)    Throat infection per patient   Flonase [Fluticasone Propionate] Other (See Comments)    Upper respitory  issues   Hydrocodone-Acetaminophen Nausea And Vomiting   Codeine Nausea And Vomiting   Morphine And Codeine Nausea And Vomiting   Nickel Rash   Phenobarbital Hypertension and Rash    Physical Exam: There were no vitals filed for this visit.  There are palpable pedal pulses on the left foot.  There is slight forefoot varus.  There is evidence of previous bunion correction with the dorsal scar on the first ray.  There is localized edema to the proximal nail fold with pain on palpation of this area.  There is no pain on palpation of the proximal phalanx of the first MPJ.  No open lesions are  noted.  There is pain with attempted motion of the hallux IPJ.  Radiographs from the emergency department on 07/12/2023 reviewed today to confirm no fracture.  Assessment/Plan of Care: 1. Contusion of left great toe without damage to nail, subsequent encounter   2. Localized edema    The emergency department note from 07/12/2023 was reviewed as well as the x-rays today.  Discussed clinical findings with patient today.  Patient will continue wearing her surgical shoe that was dispensed by the emergency department after the next 7 to 10 days.  Avoid closed toe shoes or any tight stockings during this time as well.   Informed the patient that she can apply a frozen bag of peas over the toe near the proximal nail fold to cut down on the edema and bruising to the area which will start to be evident as the nail grows out.  Informed the patient that the bruising will start to be noticed within the next few weeks with her regular nail growth.  If there is significant bruising the nail may become loose and fall off on its own.  If she has any symptoms at that time we can remove the nail in the office.  Follow-up in 2 to 3 weeks for recheck and rex-ray to make sure there is no stress fracture.   Clerance Lav, DPM, FACFAS [facility]     [address] [place], [place] [address]                Office [phone]  Fax [phone]

## 2023-07-20 ENCOUNTER — Telehealth: Payer: Self-pay

## 2023-07-20 NOTE — Telephone Encounter (Signed)
Transition Care Management Unsuccessful Follow-up Telephone Call  Date of discharge and from where:  [name] 7/24  Attempts:  2nd Attempt  Reason for unsuccessful TCM follow-up call:  No answer/busy   Lenard Forth [facility] Guide, [facility] [phone] [address] [place], [place], [place] [address] Phone: [phone] Email: [email or web address]

## 2023-07-20 NOTE — Telephone Encounter (Signed)
Transition Care Management Unsuccessful Follow-up Telephone Call  Date of discharge and from where:  [name] 7/24  Attempts:  1st Attempt  Reason for unsuccessful TCM follow-up call:  No answer/busy      Lenard Forth [facility] Guide, [facility] [phone] [address] [place], [place], [place] [address] Phone: [phone] Email: [email or web address]

## 2023-07-21 NOTE — Progress Notes (Unsigned)
    SUBJECTIVE:   CHIEF COMPLAINT / HPI:   HTN Currently taking hydrochlorothiazide 12.5 mg and lisinopril 30 mg. Due for annual BMP to assess kidney function and electrolytes.     PERTINENT  PMH / PSH: HTN HLD, obesity, prediabetes  OBJECTIVE:   BP 130/80 (BP Location: Right Arm, Patient Position: Sitting, Cuff Size: Large)   Pulse 72   Wt 250 lb 3.2 oz (113.5 kg)   SpO2 99%   BMI 40.38 kg/m    General: NAD, pleasant, able to participate in exam Cardiac: RRR, no murmurs. Respiratory: CTAB, normal effort, No wheezes, rales or rhonchi Skin: warm and dry Neuro: alert, no obvious focal deficits Psych: Normal affect and mood  ASSESSMENT/PLAN:   HYPERTENSION, BENIGN SYSTEMIC Well-controlled. -Continue current regimen -BMP  HYPERLIPIDEMIA Not discussed today.  On chart review, last lipid panel in 2021 indicated need for statin which patient declined as she felt unwell on a statin previously.  It was documented she preferred to try lifestyle modifications.  She did state today that she has been walking with her family frequently.  At next visit, can discuss rechecking lipid panel if desired.   -Patient to return in 6 months or sooner if needed  Dr. Erick Alley, DO [facility]

## 2023-07-22 ENCOUNTER — Encounter: Payer: Self-pay | Admitting: Student

## 2023-07-22 ENCOUNTER — Ambulatory Visit (INDEPENDENT_AMBULATORY_CARE_PROVIDER_SITE_OTHER): Payer: 59 | Admitting: Student

## 2023-07-22 VITALS — BP 130/80 | HR 72 | Wt 250.2 lb

## 2023-07-22 DIAGNOSIS — I10 Essential (primary) hypertension: Secondary | ICD-10-CM

## 2023-07-22 NOTE — Patient Instructions (Signed)
It was great to see you! Thank you for allowing me to participate in your care!  I recommend that you always bring your medications to each appointment as this makes it easy to ensure you are on the correct medications and helps [place] not miss when refills are needed.  Our plans for today:  - continue current medications - Return in 6 months or sooner if needed   We are checking some labs today, I will call you if they are abnormal will send you a MyChart message or a letter if they are normal.  If you do not hear about your labs in the next 2 weeks please let us know.  Take care and seek immediate care sooner if you develop any concerns.   Dr. Erick Alley, DO [facility]

## 2023-07-23 NOTE — Assessment & Plan Note (Signed)
Well-controlled. -Continue current regimen -BMP

## 2023-07-23 NOTE — Assessment & Plan Note (Signed)
Not discussed today.  On chart review, last lipid panel in 2021 indicated need for statin which patient declined as she felt unwell on a statin previously.  It was documented she preferred to try lifestyle modifications.  She did state today that she has been walking with her family frequently.  At next visit, can discuss rechecking lipid panel if desired.

## 2023-08-01 ENCOUNTER — Ambulatory Visit: Payer: 59 | Admitting: Podiatry

## 2023-09-01 ENCOUNTER — Inpatient Hospital Stay: Admission: RE | Admit: 2023-09-01 | Payer: 59 | Source: Ambulatory Visit

## 2023-09-05 ENCOUNTER — Ambulatory Visit: Payer: 59 | Admitting: Podiatry

## 2023-09-05 ENCOUNTER — Other Ambulatory Visit: Payer: Self-pay | Admitting: Student

## 2023-09-05 DIAGNOSIS — R112 Nausea with vomiting, unspecified: Secondary | ICD-10-CM

## 2023-09-19 ENCOUNTER — Other Ambulatory Visit: Payer: Self-pay | Admitting: Student

## 2023-09-28 DIAGNOSIS — H40013 Open angle with borderline findings, low risk, bilateral: Secondary | ICD-10-CM | POA: Diagnosis not present

## 2023-10-05 ENCOUNTER — Other Ambulatory Visit: Payer: Self-pay

## 2023-10-05 ENCOUNTER — Ambulatory Visit: Payer: 59 | Admitting: Student

## 2023-10-05 ENCOUNTER — Encounter: Payer: Self-pay | Admitting: Student

## 2023-10-05 VITALS — BP 141/77 | HR 73 | Ht 66.0 in | Wt 248.8 lb

## 2023-10-05 DIAGNOSIS — I10 Essential (primary) hypertension: Secondary | ICD-10-CM

## 2023-10-05 DIAGNOSIS — M25551 Pain in right hip: Secondary | ICD-10-CM | POA: Diagnosis not present

## 2023-10-05 MED ORDER — NAPROXEN 500 MG PO TABS: 500.0000 mg | ORAL_TABLET | Freq: Two times a day (BID) | ORAL | 0 refills | Status: DC

## 2023-10-05 NOTE — Assessment & Plan Note (Signed)
Just above goal at 141 systolic.  Controlled at home.  Will not change medications today as patient is in pain.  Will recheck in 2 weeks at follow-up

## 2023-10-05 NOTE — Assessment & Plan Note (Signed)
History and exam most consistent with trochanteric bursitis.  Shared decision making used, patient declined steroid injection and prefers trial of NSAID. Can consider x-rays in future if pain persists to look for OA/fractures.   -Rx naproxen -Handout provided on stretching and exercises for trochanteric bursitis -Rest -Return in 2 weeks for follow-up.

## 2023-10-05 NOTE — Progress Notes (Signed)
    SUBJECTIVE:   CHIEF COMPLAINT / HPI:   HTN Taking lisinopril 30 mg and hydrochlorothiazide 12.5 mg  Check BP at home - running 130's/80's.   R hip pain Went for hike up steep hill on Saturday, on Sunday stood up after sitting for a long time and had sharp/stabbing R hip pain. Still having the pain intermittently worse with walking and going from sitting to standing. Has been using her cane. Took tramadol which is prescribed to her which helped some.  Denies any chills or fevers.   PERTINENT  PMH / PSH: Obesity, HTN  OBJECTIVE:   BP (!) 141/77 Comment: BP was taken at the end of visit but had to document due to techincal issues  Pulse 73   Ht 5\' 6"  (1.676 m)   Wt 248 lb 12.8 oz (112.9 kg)   SpO2 99%   BMI 40.16 kg/m    General: NAD, pleasant, able to participate in exam Cardiac: RRR, no murmurs. Respiratory: Breathing comfortably on room air Abdomen: Bowel sounds present, nontender, nondistended, no hepatosplenomegaly. Extremities/MSK: R hip - No swelling or deformity, good ROM with flexion, internal and external rotation, 5/5 muscle strength with hip flexion, 4/5 muscle strength with hip extension due to pain.  TTP of greater trochanter, significant pain when going from sitting to standing during exam Skin: warm and dry, no rashes noted Neuro: alert, no obvious focal deficits Psych: Normal affect and mood  ASSESSMENT/PLAN:   Right hip pain History and exam most consistent with trochanteric bursitis.  Shared decision making used, patient declined steroid injection and prefers trial of NSAID. Can consider x-rays in future if pain persists to look for OA/fractures.   -Rx naproxen -Handout provided on stretching and exercises for trochanteric bursitis -Rest -Return in 2 weeks for follow-up.    HYPERTENSION, BENIGN SYSTEMIC Just above goal at 141 systolic.  Controlled at home.  Will not change medications today as patient is in pain.  Will recheck in 2 weeks at follow-up    Hyperlipidemia Plan for lipid panel at next visit - last done in 2021, not on a statin as she previously preferred to trial diet and exercise.   Dr. Erick Alley, DO [facility]

## 2023-10-18 ENCOUNTER — Ambulatory Visit: Payer: 59 | Admitting: Student

## 2023-10-18 ENCOUNTER — Encounter: Payer: Self-pay | Admitting: Student

## 2023-10-18 ENCOUNTER — Other Ambulatory Visit: Payer: Self-pay

## 2023-10-18 VITALS — BP 142/80 | HR 68 | Ht 66.0 in | Wt 251.2 lb

## 2023-10-18 DIAGNOSIS — I10 Essential (primary) hypertension: Secondary | ICD-10-CM | POA: Diagnosis not present

## 2023-10-18 DIAGNOSIS — M25551 Pain in right hip: Secondary | ICD-10-CM

## 2023-10-18 DIAGNOSIS — R7303 Prediabetes: Secondary | ICD-10-CM

## 2023-10-18 DIAGNOSIS — E785 Hyperlipidemia, unspecified: Secondary | ICD-10-CM | POA: Diagnosis not present

## 2023-10-18 NOTE — Assessment & Plan Note (Signed)
Resolved with NSAIDs and stretching/exercises.

## 2023-10-18 NOTE — Progress Notes (Signed)
    SUBJECTIVE:   CHIEF COMPLAINT / HPI:   Right hip pain Thought to be due to trochanteric bursitis, patient given Rx naproxen and handout on stretching and exercises which today she states resolved the pain.   HTN Currently taking lisinopril 30 mg and hydrochlorothiazide 12.5 mg only as needed for swelling - did take it this morning. Checked BP at home twice in past week 138-140/80's. Feeling well today.   HLD Not on a statin although it was indicated previously, patient preferred to try diet and exercise.  Will check today.   PERTINENT  PMH / PSH: HTN, obesity  OBJECTIVE:   BP (!) 142/80   Pulse 68   Ht 5\' 6"  (1.676 m)   Wt 251 lb 3.2 oz (113.9 kg)   SpO2 100%   BMI 40.54 kg/m    General: NAD, pleasant, able to participate in exam Cardiac: RRR, no murmurs. Respiratory: CTAB, normal effort, No wheezes, rales or rhonchi Abdomen: Bowel sounds present, nontender, nondistended, no hepatosplenomegaly. Extremities: no edema or cyanosis. Skin: warm and dry, no rashes noted Neuro: alert, no obvious focal deficits Psych: Normal affect and mood  ASSESSMENT/PLAN:   Right hip pain Resolved with NSAIDs and stretching/exercises.  HYPERLIPIDEMIA Lipid panel today, can discuss starting statin therapy if indicated  HYPERTENSION, BENIGN SYSTEMIC Uncontrolled on current regimen.   -Continue lisinopril 30 mg daily, start taking hydrochlorothiazide 12.5 mg every day, not just as needed for swelling -Return in 2 weeks for BMP and blood pressure check     Dr. Erick Alley, DO [facility]

## 2023-10-18 NOTE — Assessment & Plan Note (Signed)
Lipid panel today, can discuss starting statin therapy if indicated

## 2023-10-18 NOTE — Assessment & Plan Note (Signed)
Uncontrolled on current regimen.   -Continue lisinopril 30 mg daily, start taking hydrochlorothiazide 12.5 mg every day, not just as needed for swelling -Return in 2 weeks for BMP and blood pressure check

## 2023-10-18 NOTE — Patient Instructions (Signed)
It was great to see you! Thank you for allowing me to participate in your care!  Our plans for today:  - Start taking the hydrochlorothiazide every day along with the lisinopril -Return in 2 weeks for blood work and blood pressure check  We are checking some labs today, I will call you if they are abnormal will send you a MyChart message or a letter if they are normal.  If you do not hear about your labs in the next 2 weeks please let us know.  Take care and seek immediate care sooner if you develop any concerns.   Dr. Erick Alley, DO [facility]

## 2023-10-19 ENCOUNTER — Telehealth: Payer: 59 | Admitting: Student

## 2023-10-19 DIAGNOSIS — E785 Hyperlipidemia, unspecified: Secondary | ICD-10-CM

## 2023-10-19 LAB — LIPID PANEL
Chol/HDL Ratio: 5 ratio — ABNORMAL HIGH (ref 0.0–4.4)
Cholesterol, Total: 191 mg/dL (ref 100–199)
HDL: 38 mg/dL — ABNORMAL LOW (ref >39)
LDL Chol Calc (NIH): 128 mg/dL — ABNORMAL HIGH (ref 0–99)
Triglycerides: 140 mg/dL (ref 0–149)
VLDL Cholesterol Cal: 25 mg/dL (ref 5–40)

## 2023-10-19 MED ORDER — ROSUVASTATIN CALCIUM 10 MG PO TABS: 10.0000 mg | ORAL_TABLET | Freq: Every day | ORAL | 3 refills | Status: DC

## 2023-10-19 NOTE — Telephone Encounter (Signed)
Called patient, discussed recent lipid panel showing need for moderate to high intensity statin.  Shared decision making used, patient would like to start Crestor 10 mg daily.  Can recheck lipid panel in 3 to 6 months and increase to high intensity statin if indicated/tolerated .  The 10-year ASCVD risk score (Arnett DK, et al., 2019) is: 13.5%   Values used to calculate the score:     Age: 67 years     Sex: Female     Is Non-Hispanic African American: No     Diabetic: No     Tobacco smoker: No     Systolic Blood Pressure: 142 mmHg     Is BP treated: Yes     HDL Cholesterol: 38 mg/dL     Total Cholesterol: 191 mg/dL

## 2023-10-23 ENCOUNTER — Encounter: Payer: Self-pay | Admitting: Student

## 2023-10-24 ENCOUNTER — Emergency Department (HOSPITAL_COMMUNITY)
Admission: EM | Admit: 2023-10-24 | Discharge: 2023-10-24 | Disposition: A | Discharge status: Home / Self Care | Payer: 59 | Attending: Emergency Medicine | Admitting: Emergency Medicine

## 2023-10-24 ENCOUNTER — Encounter (HOSPITAL_COMMUNITY): Payer: Self-pay | Admitting: Emergency Medicine

## 2023-10-24 ENCOUNTER — Other Ambulatory Visit: Payer: Self-pay

## 2023-10-24 DIAGNOSIS — I10 Essential (primary) hypertension: Secondary | ICD-10-CM | POA: Diagnosis not present

## 2023-10-24 DIAGNOSIS — Z79899 Other long term (current) drug therapy: Secondary | ICD-10-CM | POA: Insufficient documentation

## 2023-10-24 DIAGNOSIS — Z7982 Long term (current) use of aspirin: Secondary | ICD-10-CM | POA: Diagnosis not present

## 2023-10-24 DIAGNOSIS — Z0130 Encounter for examination of blood pressure without abnormal findings: Secondary | ICD-10-CM | POA: Diagnosis not present

## 2023-10-24 LAB — CBC WITH DIFFERENTIAL/PLATELET
Abs Immature Granulocytes: 0.01 10*3/uL (ref 0.00–0.07)
Basophils Absolute: 0 10*3/uL (ref 0.0–0.1)
Basophils Relative: 0 %
Eosinophils Absolute: 0.1 10*3/uL (ref 0.0–0.5)
Eosinophils Relative: 2 %
HCT: 43.2 % (ref 36.0–46.0)
Hemoglobin: 12.6 g/dL (ref 12.0–15.0)
Immature Granulocytes: 0 %
Lymphocytes Relative: 50 %
Lymphs Abs: 3.2 10*3/uL (ref 0.7–4.0)
MCH: 25.6 pg — ABNORMAL LOW (ref 26.0–34.0)
MCHC: 29.2 g/dL — ABNORMAL LOW (ref 30.0–36.0)
MCV: 87.6 fL (ref 80.0–100.0)
Monocytes Absolute: 0.6 10*3/uL (ref 0.1–1.0)
Monocytes Relative: 9 %
Neutro Abs: 2.6 10*3/uL (ref 1.7–7.7)
Neutrophils Relative %: 39 %
Platelets: 303 10*3/uL (ref 150–400)
RBC: 4.93 MIL/uL (ref 3.87–5.11)
RDW: 14.6 % (ref 11.5–15.5)
WBC: 6.5 10*3/uL (ref 4.0–10.5)
nRBC: 0 % (ref 0.0–0.2)

## 2023-10-24 LAB — COMPREHENSIVE METABOLIC PANEL
ALT: 13 U/L (ref 0–44)
AST: 14 U/L — ABNORMAL LOW (ref 15–41)
Albumin: 3.6 g/dL (ref 3.5–5.0)
Alkaline Phosphatase: 74 U/L (ref 38–126)
Anion gap: 7 (ref 5–15)
BUN: 26 mg/dL — ABNORMAL HIGH (ref 8–23)
CO2: 25 mmol/L (ref 22–32)
Calcium: 8.9 mg/dL (ref 8.9–10.3)
Chloride: 107 mmol/L (ref 98–111)
Creatinine, Ser: 1.23 mg/dL — ABNORMAL HIGH (ref 0.44–1.00)
GFR, Estimated: 48 mL/min — ABNORMAL LOW (ref >60)
Glucose, Bld: 124 mg/dL — ABNORMAL HIGH (ref 70–99)
Potassium: 3.8 mmol/L (ref 3.5–5.1)
Sodium: 139 mmol/L (ref 135–145)
Total Bilirubin: 0.3 mg/dL (ref <1.2)
Total Protein: 7.5 g/dL (ref 6.5–8.1)

## 2023-10-24 NOTE — ED Provider Triage Note (Signed)
Emergency Medicine Provider Triage Evaluation Note  Roberta [name] , a 67 y.o. female  was evaluated in triage.  Pt complains of low blood pressure readings since starting her hydrochlorothiazide daily with her lisinopril 3 days ago.  She also reports sharp right upper quadrant abdominal pain following oral ingestion. She denies fever  Review of Systems  Positive: Low blood pressure readings Negative: Chest pain, shortness of breath, fever, cough, urinary symptoms  Physical Exam  BP 137/79 (BP Location: Right Arm)   Pulse 76   Temp 98.4 F (36.9 C) (Oral)   Resp 18   Ht 5\' 6"  (1.676 m)   Wt 113.9 kg   SpO2 99%   BMI 40.51 kg/m  Gen:   Awake, no distress   Resp:  Normal effort  MSK:   Moves extremities without difficulty  Other:    Medical Decision Making  Medically screening exam initiated at 2:05 PM.  Appropriate orders placed.  Roberta [name] was informed that the remainder of the evaluation will be completed by another provider, this initial triage assessment does not replace that evaluation, and the importance of remaining in the ED until their evaluation is complete.     Judithann Sheen, PA 10/24/23 [phone]

## 2023-10-24 NOTE — ED Provider Notes (Signed)
[place] EMERGENCY DEPARTMENT AT [facility] Provider Note   CSN: [id number] Arrival date & time: 10/24/23  1330     History  Chief Complaint  Patient presents with   Hypotension    Roberta [name] is a 67 y.o. female with past medical history of hypertension, hyperlipidemia, cholecystectomy presents to the emergency department for evaluation of low blood pressure readings at home. She reports that she was told to take her hydrochlorothiazide daily instead of as needed as she has been taking it. Since Friday she has been taking both lisinopril and hydrochlorothiazide and has had low blood pressure readings at home since then.  HPI     Home Medications Prior to Admission medications   Medication Sig Start Date End Date Taking? Authorizing Provider  albuterol (PROAIR HFA) 108 (90 Base) MCG/ACT inhaler Inhale 2 puffs into the lungs every 4 (four) hours as needed. 05/04/22   Autry-Lott, Randa Evens, DO  albuterol (PROVENTIL) (2.5 MG/3ML) 0.083% nebulizer solution Take 3 mLs (2.5 mg total) by nebulization every 6 (six) hours as needed for wheezing or shortness of breath. 04/03/21   Simmons-Robinson, Tawanna Cooler, MD  aspirin EC 81 MG tablet Take 1 tablet (81 mg total) by mouth daily. Swallow whole. Patient taking differently: Take 81 mg by mouth every other day. Swallow whole. 11/02/22   Erick Alley, DO  cyclobenzaprine (FLEXERIL) 10 MG tablet Take 10 mg daily as needed by mouth for muscle spasms.    [provider]  dorzolamide-timolol (COSOPT) 22.3-6.8 MG/ML ophthalmic solution Place 1 drop into both eyes 2 (two) times daily. 10/05/17   Renne Musca, MD  esomeprazole (NEXIUM) 20 MG capsule TAKE 1 CAPSULE(20 MG) BY MOUTH DAILY AS NEEDED FOR STOMACH ACID OR REFLUX 04/19/23   Erick Alley, DO  hydrochlorothiazide (HYDRODIURIL) 12.5 MG tablet TAKE 1 TABLET(12.5 MG) BY MOUTH DAILY AS NEEDED FOR SWELLING 01/10/23   Erick Alley, DO  lisinopril (ZESTRIL) 30 MG tablet TAKE 1  TABLET(30 MG) BY MOUTH AT BEDTIME 03/03/23   Erick Alley, DO  LORazepam (ATIVAN) 0.5 MG tablet Take 1 tablet (0.5 mg total) by mouth daily as needed for anxiety (airplane ride). 05/19/21   Littie Deeds, MD  meclizine (ANTIVERT) 12.5 MG tablet TAKE 1 TABLET(12.5 MG) BY MOUTH THREE TIMES DAILY AS NEEDED 09/20/23   Erick Alley, DO  Multiple Vitamins-Minerals (HAIR SKIN & NAILS ADVANCED PO) Take by mouth.    [provider]  naproxen (NAPROSYN) 500 MG tablet Take 1 tablet (500 mg total) by mouth 2 (two) times daily with a meal. 10/05/23   Erick Alley, DO  ondansetron (ZOFRAN) 4 MG tablet TAKE 1 TABLET BY MOUTH EVERY 8 HOURS AS NEEDED FOR NAUSEA OR VOMITING 09/06/23   Erick Alley, DO  rosuvastatin (CRESTOR) 10 MG tablet Take 1 tablet (10 mg total) by mouth daily. 10/19/23   Erick Alley, DO  traMADol (ULTRAM) 50 MG tablet Take 50 mg daily as needed by mouth for moderate pain.    [provider]  Travoprost, BAK Free, (TRAVATAN) 0.004 % SOLN ophthalmic solution Place 1 drop into both eyes at bedtime. 10/23/14   Charlane Ferretti, MD      Allergies    Influenza vaccines, Budesonide, Flonase [fluticasone propionate], Hydrocodone-acetaminophen, Codeine, Morphine and codeine, Nickel, and Phenobarbital    Review of Systems   Review of Systems  Constitutional:  Negative for chills, fatigue and fever.  Respiratory:  Negative for cough, chest tightness, shortness of breath and wheezing.   Cardiovascular:  Negative for  chest pain and palpitations.  Gastrointestinal:  Negative for abdominal pain, constipation, diarrhea, nausea and vomiting.  Neurological:  Negative for dizziness, seizures, weakness, light-headedness, numbness and headaches.    Physical Exam Updated Vital Signs BP 137/79 (BP Location: Right Arm)   Pulse 76   Temp 98.4 F (36.9 C) (Oral)   Resp 18   Ht 5\' 6"  (1.676 m)   Wt 113.9 kg   SpO2 99%   BMI 40.51 kg/m  Physical Exam Vitals and nursing note reviewed.   Constitutional:      General: She is not in acute distress.    Appearance: Normal appearance.  HENT:     Head: Normocephalic and atraumatic.  Eyes:     General: No visual field deficit.    Conjunctiva/sclera: Conjunctivae normal.  Cardiovascular:     Rate and Rhythm: Normal rate.  Pulmonary:     Effort: Pulmonary effort is normal. No respiratory distress.  Abdominal:     General: There is no distension.     Palpations: Abdomen is soft.     Tenderness: There is no abdominal tenderness. There is no right CVA tenderness, left CVA tenderness or guarding.  Musculoskeletal:     Cervical back: Normal range of motion and neck supple. No rigidity or tenderness.  Lymphadenopathy:     Cervical: No cervical adenopathy.  Skin:    General: Skin is warm.     Capillary Refill: Capillary refill takes less than 2 seconds.     Coloration: Skin is not jaundiced or pale.  Neurological:     Mental Status: She is alert and oriented to person, place, and time. Mental status is at baseline.     GCS: GCS eye subscore is 4. GCS verbal subscore is 5. GCS motor subscore is 6.     Cranial Nerves: No cranial nerve deficit or facial asymmetry.     Sensory: Sensation is intact. No sensory deficit.     Motor: Motor function is intact. No weakness, abnormal muscle tone or pronator drift.     Coordination: Coordination is intact. Coordination normal. Finger-Nose-Finger Test and Heel to [facility] Test normal. Rapid alternating movements normal.     Gait: Gait normal.     Deep Tendon Reflexes: Reflexes normal.     ED Results / Procedures / Treatments   Labs (all labs ordered are listed, but only abnormal results are displayed) Labs Reviewed  CBC WITH DIFFERENTIAL/PLATELET  COMPREHENSIVE METABOLIC PANEL    EKG None  Radiology No results found.  Procedures Procedures    Medications Ordered in ED Medications - No data to display  ED Course/ Medical Decision Making/ A&P                                  Medical Decision Making Amount and/or Complexity of Data Reviewed Labs: ordered.    Patient presents to the ED for concern of low blood pressure readings since Friday, this involves an extensive number of treatment options, and is a complaint that carries with it a high risk of complications and morbidity.  The differential diagnosis includes polypharmacy, acute pathology.   Co morbidities that complicate the patient evaluation  None   Additional history obtained:  Additional history obtained from Nursing and Outside Medical Records   External records from outside source obtained and reviewed including primary care providers note from 10/18/2023 which recommended taking hydrochlorothiazide every day with lisinopril and recommendation to return in 2  weeks for blood work and blood pressure check.   Lab Tests:  I Ordered, and personally interpreted labs.  The pertinent results include: No anemia, lecture light abnormality, abnormality requiring acute ED intervention   Problem List / ED Course:  Pressure check   Reevaluation:  After the interventions noted above, I reevaluated the patient and found that they have :improved   Dispostion:  Patient is resting comfortably in bed.  She has no neurological deficits or complaints.  Vital signs within normal limits with lowest blood pressure 102 systolic.  See HPI.  Physical exam is reassuring and patient is neurologically intact.  I performed orthostatic vital signs and patient had no orthostasis.  She had no complaints of neurological symptoms with change in position.  Lab work is not significant for acute pathology to require further workup.  Decreased blood pressure is likely due to patient taking hydrochlorothiazide with lisinopril.    After consideration of the diagnostic results and the patients response to treatment, I feel that the patent would benefit from follow-up with PCP with her appointment tomorrow regarding blood  pressure maintenance.  I discussed findings, disposition, return to emergency department precautions with patient expresses understanding agrees with plan.  Return to emergency department cautions include but not limited to lightheadedness/dizziness, neurological symptoms, syncope, seizures, altered mentation.         Final Clinical Impression(s) / ED Diagnoses Final diagnoses:  None    Rx / DC Orders ED Discharge Orders     None         Judithann Sheen, PA 10/24/23 2226    Gerhard Munch, MD 10/25/23 [phone]

## 2023-10-24 NOTE — Discharge Instructions (Signed)
Thank you for letting us evaluate you today.  Your blood pressure has been within normal limits here and has not been low in the emergency department.  I recommend that you do not take your blood pressure medication tonight as you are scheduled to take it tomorrow morning.  You can take your lisinopril as you have been taking this without issues in the past and follow-up with your primary care provider regarding further management of the hydrochlorothiazide.  Your lab work was not significant for low blood counts, infection, electrolyte abnormalities.  Please return to emergency department if you experience syncope, seizures, altered mentation

## 2023-10-24 NOTE — ED Triage Notes (Signed)
Patient arrives ambulatory with cane by POV- states recently started on new cholesterol medication on Friday and her doctor requested her to check BP daily. States this morning BP was 107/67 using wrist cuff. Called her doctor and told to come to ED for evaluation.

## 2023-10-24 NOTE — Telephone Encounter (Signed)
Patient calls nurse line in regards to blood pressure.   She reports she has been getting low readings over the last couple of days.   She reports most recently her BP was 103/66 ~ 30 minutes ago. She reports she has not had any blood pressure medication today.   She does feel fatigued and light headed.   Advised will send to PCP.   Precautions discussed.

## 2023-10-25 ENCOUNTER — Ambulatory Visit: Payer: 59

## 2023-10-25 ENCOUNTER — Ambulatory Visit (INDEPENDENT_AMBULATORY_CARE_PROVIDER_SITE_OTHER): Payer: 59 | Admitting: Student

## 2023-10-25 VITALS — BP 126/96 | HR 98 | Ht 66.0 in | Wt 248.2 lb

## 2023-10-25 DIAGNOSIS — I10 Essential (primary) hypertension: Secondary | ICD-10-CM

## 2023-10-25 NOTE — Progress Notes (Deleted)
  SUBJECTIVE:   CHIEF COMPLAINT / HPI:   Patient presented to the ED yesterday concern for hypotension in both her ED 12.5 mg daily lisinopril 30 mg daily.  PERTINENT  PMH / PSH: HTN, HLD, asthma, BPPV, prediabetes  OBJECTIVE:  There were no vitals taken for this visit. Physical Exam   ASSESSMENT/PLAN:   Assessment & Plan  No follow-ups on file. Shelby Mattocks, DO 10/25/2023, 8:12 AM PGY-3, [facility] {    This will disappear when note is signed, click to select method of visit    :1}

## 2023-10-25 NOTE — Progress Notes (Signed)
    SUBJECTIVE:   CHIEF COMPLAINT / HPI:   Meds include hydrochlorothiazide 12.5mg  daily, Lisinopril 30mg  daily,   PERTINENT  PMH / PSH: Hx DVT, Endometrial CA, HTN, HLD  OBJECTIVE:   There were no vitals taken for this visit.  ***  ASSESSMENT/PLAN:   No problem-specific Assessment & Plan notes found for this encounter.     [name], MD [facility]

## 2023-10-25 NOTE — Patient Instructions (Signed)
Your BP looks great! Keep up the good work. I'm glad you're checking at home, don't let healthy behaviors stand in the way of your mental health.  [name], MD

## 2023-10-26 NOTE — Assessment & Plan Note (Signed)
BP is generally well-controlled and without symptomatic or concerning close.  Based on her home readings and her readings yesterday and the ER and today in clinic, I do not see any indication to change her medications.  If this continues to be a point of great anxiety for her, could consider a 24-hour monitor in the future.  She already has follow-up scheduled with her PCP on 11/18.  Advised to keep this appointment. -Continue lisinopril 30 mg daily -Continue hydrochlorothiazide 12.5 mg daily -Keep PCP follow-up on 11/18

## 2023-11-07 ENCOUNTER — Encounter: Payer: Self-pay | Admitting: Student

## 2023-11-07 ENCOUNTER — Ambulatory Visit (INDEPENDENT_AMBULATORY_CARE_PROVIDER_SITE_OTHER): Payer: 59 | Admitting: Student

## 2023-11-07 VITALS — BP 142/88 | HR 67 | Ht 66.0 in | Wt 250.4 lb

## 2023-11-07 DIAGNOSIS — I10 Essential (primary) hypertension: Secondary | ICD-10-CM

## 2023-11-07 DIAGNOSIS — R059 Cough, unspecified: Secondary | ICD-10-CM

## 2023-11-07 DIAGNOSIS — R7989 Other specified abnormal findings of blood chemistry: Secondary | ICD-10-CM | POA: Diagnosis not present

## 2023-11-07 NOTE — Patient Instructions (Addendum)
It was great to see you! Thank you for allowing me to participate in your care!  I recommend that you always bring your medications to each appointment as this makes it easy to ensure you are on the correct medications and helps [place] not miss when refills are needed.  Our plans for today:  - Take Zyrtec every night - drink hot tea with honey before bed - If cough and hoarseness do not improve over the next 3-4 weeks, return  - Return within the next month for f/u and discuss insomnia  -schedule apt w/ Dr. Raymondo Band for BP monitor   We are checking some labs today, I will call you if they are abnormal will send you a MyChart message or a letter if they are normal.  If you do not hear about your labs in the next 2 weeks please let us know.  Take care and seek immediate care sooner if you develop any concerns.   Dr. Erick Alley, DO [facility]

## 2023-11-07 NOTE — Progress Notes (Unsigned)
    SUBJECTIVE:   CHIEF COMPLAINT / HPI:   HTN Patient was seen 10/29/2023 with BP well-controlled at 126/96.  Currently taking lisinopril 30 mg daily, HCTZ 12.5 mg daily. Has been checking BP at home - running low 100's - 120's/70's. SBP typically in low 100's.   Cough and hoarse  Started feel hoarse since she started Crestor about 2 weeks ago. Has also had a cough (she is unsure if it's wet or dry), has also had rhinorrhea. Does not feel sick. No fevers or body aches. Does have allergies, used to take zyrtec. Has only taken it once recently but didn't help.  Has not used her home albuterol, doesn't feel she needs it.   Elevated creatinine At ED visit 10/24/2023 creatinine slightly elevated at 1.23, baseline around 0.98.  Was in the ED in setting of low blood pressure readings at home, lowest being 99/60.  PERTINENT  PMH / PSH: HTN,  OBJECTIVE:   BP (!) 142/88   Pulse 67   Ht 5\' 6"  (1.676 m)   Wt 250 lb 6.4 oz (113.6 kg)   SpO2 100%   BMI 40.42 kg/m    General: NAD, pleasant, able to participate in exam Cardiac: RRR, no murmurs. Respiratory: CTAB, normal effort, No wheezes, rales or rhonchi Abdomen: Bowel sounds present, nontender, nondistended, no hepatosplenomegaly. Extremities: no edema or cyanosis. Skin: warm and dry, no rashes noted Neuro: alert, no obvious focal deficits Psych: Normal affect and mood  ASSESSMENT/PLAN:   No problem-specific Assessment & Plan notes found for this encounter.     Dr. Erick Alley, DO [facility]    {    This will disappear when note is signed, click to select method of visit    :1}

## 2023-11-08 ENCOUNTER — Telehealth: Payer: Self-pay

## 2023-11-08 LAB — BASIC METABOLIC PANEL
BUN/Creatinine Ratio: 17 (ref 12–28)
BUN: 17 mg/dL (ref 8–27)
CO2: 23 mmol/L (ref 20–29)
Calcium: 9.5 mg/dL (ref 8.7–10.3)
Chloride: 104 mmol/L (ref 96–106)
Creatinine, Ser: 1 mg/dL (ref 0.57–1.00)
Glucose: 97 mg/dL (ref 70–99)
Potassium: 4.7 mmol/L (ref 3.5–5.2)
Sodium: 142 mmol/L (ref 134–144)
eGFR: 61 mL/min/{1.73_m2} (ref >59)

## 2023-11-08 NOTE — Telephone Encounter (Signed)
Transition Care Management Follow-up Telephone Call Date of discharge and from where: Roberta [name] 11/4 How have you been since you were released from the hospital? Patient is doing well and followed up with PCP Any questions or concerns? No  Items Reviewed: Did the pt receive and understand the discharge instructions provided? Yes  Medications obtained and verified? Yes  Other? No  Any new allergies since your discharge? No  Dietary orders reviewed? No Do you have support at home? No    Follow up appointments reviewed:  PCP Hospital f/u appt confirmed? Yes  Scheduled to see PCP on 11/18 @ . Specialist Hospital f/u appt confirmed? No  Scheduled to see  on  @ . Are transportation arrangements needed? No  If their condition worsens, is the pt aware to call PCP or go to the Emergency Dept.? Yes Was the patient provided with contact information for the PCP's office or ED? Yes Was to pt encouraged to call back with questions or concerns? Yes

## 2023-11-08 NOTE — Telephone Encounter (Signed)
Transition Care Management Unsuccessful Follow-up Telephone Call  Date of discharge and from where:  [facility] 11/4  Attempts:  1st Attempt  Reason for unsuccessful TCM follow-up call:  No answer/busy   Lenard Forth Laporte  [facility], [facility] Guide, Phone: [phone] Website: [email or web address]

## 2023-11-09 ENCOUNTER — Encounter: Payer: Self-pay | Admitting: Student

## 2023-11-09 DIAGNOSIS — R7989 Other specified abnormal findings of blood chemistry: Secondary | ICD-10-CM | POA: Insufficient documentation

## 2023-11-09 NOTE — Assessment & Plan Note (Addendum)
Acute cough with some rhinorrhea.  Has history of seasonal allergies.  Vitals and exam reassuring.  Discussed that cough is unlikely related to starting Crestor. -Start Zyrtec daily -Discussed Flonase however patient had an adverse reaction years ago -Hot tea and honey for cough -Can trial albuterol inhaler and see if it helps -If worsens or does not improve within the next few weeks, return

## 2023-11-09 NOTE — Assessment & Plan Note (Signed)
Uncontrolled in the office today however home readings are very controlled and on the lower end so will not change regimen today -Continue lisinopril 30 mg daily, HCTZ 12.5 mg daily -Patient to schedule appointment with Dr. Raymondo Band for ambulatory blood pressure monitoring

## 2023-11-09 NOTE — Assessment & Plan Note (Signed)
Cr slightly elevated earlier this month but not concerning for AKI.  Will recheck today -BMP

## 2023-11-09 NOTE — Addendum Note (Signed)
Addended by: Howard Pouch on: 11/09/2023 09:38 AM   Modules accepted: Orders

## 2023-11-24 ENCOUNTER — Encounter: Payer: Self-pay | Admitting: Pharmacist

## 2023-11-24 ENCOUNTER — Emergency Department (HOSPITAL_COMMUNITY)
Admission: EM | Admit: 2023-11-24 | Discharge: 2023-11-25 | Disposition: A | Discharge status: Home / Self Care | Payer: 59 | Attending: Emergency Medicine | Admitting: Emergency Medicine

## 2023-11-24 ENCOUNTER — Ambulatory Visit: Payer: 59 | Admitting: Pharmacist

## 2023-11-24 ENCOUNTER — Other Ambulatory Visit: Payer: Self-pay

## 2023-11-24 VITALS — BP 170/93 | Ht 66.0 in | Wt 254.2 lb

## 2023-11-24 DIAGNOSIS — S91111A Laceration without foreign body of right great toe without damage to nail, initial encounter: Secondary | ICD-10-CM | POA: Insufficient documentation

## 2023-11-24 DIAGNOSIS — I10 Essential (primary) hypertension: Secondary | ICD-10-CM | POA: Diagnosis not present

## 2023-11-24 DIAGNOSIS — W268XXA Contact with other sharp object(s), not elsewhere classified, initial encounter: Secondary | ICD-10-CM | POA: Insufficient documentation

## 2023-11-24 DIAGNOSIS — S91311A Laceration without foreign body, right foot, initial encounter: Secondary | ICD-10-CM

## 2023-11-24 MED ORDER — TETANUS-DIPHTH-ACELL PERTUSSIS 5-2.5-18.5 LF-MCG/0.5 IM SUSY
0.5000 mL | PREFILLED_SYRINGE | Freq: Once | INTRAMUSCULAR | Status: DC
  Filled 2023-11-24: qty 0.5

## 2023-11-24 MED ORDER — LIDOCAINE-EPINEPHRINE (PF) 2 %-1:200000 IJ SOLN
20.0000 mL | Freq: Once | INTRAMUSCULAR | Status: AC
  Administered 2023-11-24: 20 mL
  Filled 2023-11-24: qty 20

## 2023-11-24 MED ORDER — BACITRACIN ZINC 500 UNIT/GM EX OINT
TOPICAL_OINTMENT | Freq: Two times a day (BID) | CUTANEOUS | Status: DC
  Filled 2023-11-24: qty 0.9

## 2023-11-24 NOTE — ED Provider Notes (Signed)
[facility]ER[facility]ENCY DEPT [facility] Emergency Department Provider Note MRN:  [id number]  Arrival date & time: 11/24/23     Chief Complaint   Extremity Laceration   History of Present Illness   Roberta [name] is a 67 y.o. year-old female presents to the ED with chief complaint of laceration to right great toe.  She states that she dropped a ceramic plate on the floor and a shard bounced and cut her foot on the top of the right great toe.  She states that it feels fine and she has been walking well.  She doesn't think that anything is broken.  She states that she came in because she couldn't get the bleeding to stop.  The bleeding has since stopped.  She takes a baby aspirin daily, but denies any other thinners.  History provided by patient.   Review of Systems  Pertinent positive and negative review of systems noted in HPI.    Physical Exam   Vitals:   11/24/23 2216  BP: (!) 152/95  Pulse: 82  Resp: 18  Temp: 98.5 F (36.9 C)  SpO2: 98%    CONSTITUTIONAL:  well-appearing, NAD NEURO:  Alert and oriented x 3, CN 3-12 grossly intact EYES:  eyes equal and reactive ENT/NECK:  Supple, no stridor  CARDIO: appears well-perfused  PULM:  No respiratory distress GI/GU:  non-distended, MSK/SPINE:  No gross deformities, no edema, moves all extremities, normal ROM and strength of the toe SKIN:  superficial 3 cm laceration to the top of the right great toe near the MTP, no tendon laceration visible, 1 cm shallow scrape on the left great toe that doesn't require and repair.   *Additional and/or pertinent findings included in MDM below  Diagnostic and Interventional Summary    EKG Interpretation Date/Time:    Ventricular Rate:    PR Interval:    QRS Duration:    QT Interval:    QTC Calculation:   R Axis:      Text Interpretation:         Labs Reviewed - No data to display  No orders to display    Medications  lidocaine-EPINEPHrine (XYLOCAINE W/EPI) 2 %-1:200000 (PF)  injection 20 mL (has no administration in time range)  Tdap (BOOSTRIX) injection 0.5 mL (has no administration in time range)  bacitracin ointment (has no administration in time range)     Procedures  /  Critical Care .Laceration Repair  Date/Time: 11/24/2023 11:36 PM  Performed by: Roxy Horseman, PA-C Authorized by: Roxy Horseman, PA-C   Consent:    Consent obtained:  Verbal   Consent given by:  Patient   Risks, benefits, and alternatives were discussed: yes     Risks discussed:  Infection, need for additional repair, pain, poor cosmetic result and poor wound healing   Alternatives discussed:  No treatment Universal protocol:    Procedure explained and questions answered to patient or proxy's satisfaction: yes     Relevant documents present and verified: yes     Test results available: yes     Imaging studies available: yes     Required blood products, implants, devices, and special equipment available: yes     Site/side marked: yes     Immediately prior to procedure, a time out was called: yes     Patient identity confirmed:  Verbally with patient Anesthesia:    Anesthesia method:  Local infiltration   Local anesthetic:  Lidocaine 1% WITH epi Laceration details:    Location:  Foot  Foot location:  Top of R foot   Length (cm):  3 Pre-procedure details:    Preparation:  Patient was prepped and draped in usual sterile fashion Exploration:    Wound exploration: wound explored through full range of motion and entire depth of wound visualized     Wound extent: vascular damage     Wound extent: fascia not violated, no foreign body, no signs of injury, no nerve damage and no tendon damage     Contaminated: no   Treatment:    Area cleansed with:  Povidone-iodine   Amount of cleaning:  Standard   Irrigation solution:  Tap water Skin repair:    Repair method:  Sutures   Suture size:  4-0   Suture material:  Prolene   Suture technique:  Simple interrupted   Number of  sutures:  4 Approximation:    Approximation:  Close Repair type:    Repair type:  Simple Post-procedure details:    Dressing:  Antibiotic ointment and adhesive bandage   Procedure completion:  Tolerated well, no immediate complications   ED Course and Medical Decision Making  I have reviewed the triage vital signs, the nursing notes, and pertinent available records from the EMR.  Social Determinants Affecting Complexity of Care: Patient has no clinically significant social determinants affecting this chief complaint..   ED Course:    Medical Decision Making Patient with small non-complicated laceration to the top of the right foot.  Repaired at bedside.  Sutures out in 10-14 days.  No further follow-up needed unless worsening symptoms.  Risk OTC drugs. Prescription drug management.         Consultants: No consultations were needed in caring for this patient.   Treatment and Plan: I considered admission due to patient's initial presentation, but after considering the examination and diagnostic results, patient will not require admission and can be discharged with outpatient follow-up.    Final Clinical Impressions(s) / ED Diagnoses     ICD-10-CM   1. Laceration of right foot, initial encounter  S91.311A       ED Discharge Orders     None         Discharge Instructions Discussed with and Provided to Patient:     Discharge Instructions      Please follow-up with your doctor, and urgent care, or here in 10-14 days for suture removal.  Return if you have worsening symptoms or signs of infection (redness, pus, fever).       Roxy Horseman, PA-C 11/24/23 2339    Terald Sleeper, MD 11/25/23 [phone]

## 2023-11-24 NOTE — Progress Notes (Signed)
   S:     Chief Complaint  Patient presents with   Medication Management    Amb BP Monitoring - Day #1   67 y.o. female who presents for hypertension evaluation, education, and management. Patient arrives in good spirits and presents without any assistance.   PMH is significant for GERD, back pain and hypertension.  Patient was referred and last seen by Primary Care Provider, Dr. Yetta Barre, on 11/07/2023.   At last visit, blood pressure evaluation with ambulatory monitor was planned.   Medication compliance is reported to be good.  Discussed procedure for wearing the monitor and gave patient written instructions. Monitor was placed on non-dominant arm with instructions to return in the morning.   Current BP Medications include:  Lisinopril 30mg  daily and hydrochlorothiazide 12.5mg  daily   O:  Review of Systems  All other systems reviewed and are negative.   Physical Exam Pulmonary:     Effort: Pulmonary effort is normal.  Neurological:     Mental Status: She is alert.  Psychiatric:        Mood and Affect: Mood normal.        Behavior: Behavior normal.        Thought Content: Thought content normal.        Judgment: Judgment normal.     Last 3 Office BP readings: BP Readings from Last 3 Encounters:  11/24/23 (!) 170/93  11/07/23 (!) 142/88  10/25/23 (!) 126/96    Clinical Atherosclerotic Cardiovascular Disease (ASCVD):  The 10-year ASCVD risk score (Arnett DK, et al., 2019) is: 18.9%   Values used to calculate the score:     Age: 47 years     Sex: Female     Is Non-Hispanic African American: No     Diabetic: No     Tobacco smoker: No     Systolic Blood Pressure: 170 mmHg     Is BP treated: Yes     HDL Cholesterol: 38 mg/dL     Total Cholesterol: 191 mg/dL  Basic Metabolic Panel    Component Value Date/Time   NA 142 11/07/2023 1559   K 4.7 11/07/2023 1559   CL 104 11/07/2023 1559   CO2 23 11/07/2023 1559   GLUCOSE 97 11/07/2023 1559   GLUCOSE 124 (H)  10/24/2023 1601   BUN 17 11/07/2023 1559   CREATININE 1.00 11/07/2023 1559   CREATININE 1.11 (H) 09/22/2016 1615   CALCIUM 9.5 11/07/2023 1559   GFRNONAA 48 (L) 10/24/2023 1601   GFRAA 67 02/04/2021 1042     ABPM Study Data: Arm Placement left arm   For Office Goal BP of <140 mmHg Systolic - lower if tolerated:  ABPM thresholds: Overall Systolic BP <130 mmHg, daytime BP <135 mmHg, sleeptime BP <120 mmHg    A/P: History of hypertension longstanding and currently taking two drug regimen; lisinopril and hydrochlorothiazide  with goal presssure of <140 mm Hg, lower if tolerated.     -Placed blood pressure cuff, provided education, patient instructed to wear cuff for 24 hours and return tomorrow to review results.   Written patient instructions provided including activity/symptom/event log. Patient verbalized understanding of plan. Total time in face to face counseling 19 minutes.    Follow-up: Tomorrow AM - early morning appointment 8:30-9:00AM

## 2023-11-24 NOTE — Discharge Instructions (Addendum)
Please follow-up with your doctor, and urgent care, or here in 10-14 days for suture removal.  Return if you have worsening symptoms or signs of infection (redness, pus, fever).

## 2023-11-24 NOTE — Patient Instructions (Signed)
Blood Pressure Activity Diary Time Lying down/ Sleeping Walking/ Exercise Stressed/ Angry Headache/ Pain Dizzy  9 AM       10 AM       11 AM       12 PM       1 PM       2 PM       Time Lying down/ Sleeping Walking/ Exercise Stressed/ Angry Headache/ Pain Dizzy  3 PM       4 PM        5 PM       6 PM       7 PM       8 PM       Time Lying down/ Sleeping Walking/ Exercise Stressed/ Angry Headache/ Pain Dizzy  9 PM       10 PM       11 PM       12 AM       1 AM       2 AM       3 AM       Time Lying down/ Sleeping Walking/ Exercise Stressed/ Angry Headache/ Pain Dizzy  4 AM       5 AM       6 AM       7 AM       8 AM       9 AM       10 AM        Time you woke up: _________                  Time you went to sleep:__________  Come back tomorrow at 8:30-9:00AM to have the monitor removed  Call the [facility] if you have any questions before then [phone])  Wearing the Blood Pressure Monitor The cuff will inflate every 20 minutes during the day and every 30 minutes while you sleep. Fill out the blood pressure-activity diary during the day, especially during activities that may affect your reading -- such as exercise, stress, walking, taking your blood pressure medications  Important things to know: Avoid taking the monitor off for the next 24 hours, unless it causes you discomfort or pain. Do NOT get the monitor wet and do NOT try to clean the monitor with any cleaning products. Do NOT put the monitor on anyone else's arm. When the cuff inflates, avoid excess movement. Let the cuffed arm hang loosely, slightly away from the body. Avoid flexing the muscles or moving the hand/fingers. Remember to fill out the blood pressure activity diary. If you experience severe pain or unusual pain (not associated with getting your blood pressure checked), remove the monitor.  Troubleshooting:  Code  Troubleshooting   1  Check cuff position, tighten cuff   2, 3   Remain still during reading   4, 87  Check air hose connections and make sure cuff is tight   85, 89  Check hose connections and make tubing is not crimped   86  Push START/STOP to restart reading   88, 91  Retry by pushing START/STOP   90  Replace batteries. If problem persists, remove monitor and bring back to   clinic at follow up   97, 98, 99  Service required - Remove monitor and bring back to clinic at follow up

## 2023-11-24 NOTE — Assessment & Plan Note (Signed)
History of hypertension longstanding and currently taking two drug regimen; lisinopril and hydrochlorothiazide  with goal presssure of <140 mm Hg, lower if tolerated.     -Placed blood pressure cuff, provided education, patient instructed to wear cuff for 24 hours and return tomorrow to review results.

## 2023-11-24 NOTE — ED Triage Notes (Signed)
Pt reports ceramic plate breaking and falling on right foot. Pt has laceration to top of foot and reports having a hard time getting the bleeding to stop initially. Bleeding is controlled with loose bandage. Pt reports tetanus shot approx 3 years ago.

## 2023-11-25 ENCOUNTER — Encounter: Payer: Self-pay | Admitting: Pharmacist

## 2023-11-25 ENCOUNTER — Ambulatory Visit (INDEPENDENT_AMBULATORY_CARE_PROVIDER_SITE_OTHER): Payer: 59 | Admitting: Pharmacist

## 2023-11-25 VITALS — BP 133/74 | HR 75

## 2023-11-25 DIAGNOSIS — I10 Essential (primary) hypertension: Secondary | ICD-10-CM | POA: Diagnosis not present

## 2023-11-25 DIAGNOSIS — S91111A Laceration without foreign body of right great toe without damage to nail, initial encounter: Secondary | ICD-10-CM | POA: Diagnosis not present

## 2023-11-25 NOTE — Patient Instructions (Signed)
It was nice to see you today!  Thank you for completing the blood pressure monitoring evaluation.  Your goal blood pressure is < 130/80 mmHg   Medication Changes: Continue all other medication the same.   Next visit is with Dr. Yetta Barre in 2 weeks.
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
## 2023-11-25 NOTE — Progress Notes (Signed)
Reviewed and agree with Dr Koval's plan.   

## 2023-11-25 NOTE — Assessment & Plan Note (Signed)
History of hypertension longstanding with a very eventful second half of her day.  Currently taking two drug regimen with ACE-I and hydrochlorothiazide.  Goal presssure of <140 mmHg systolic may be appropriate for this 67 year old given orthostasis at night. Found to have near normal blood pressure during majority of day prior to trauma, with 24-hour ambulatory blood pressure evaluation which demonstrates an average AWAKE blood pressure of 133/74 mmHg (this includes time in ER while getting stiches).  Nocturnal dipping pattern is normal.   Changes to medications - None. -Continue ACE and hydrochlorothiazide  - Consider ARB thiazide combination in the future (not changed today due to adequate supply of both meds and events of the past 24 hours).

## 2023-11-29 DIAGNOSIS — M67912 Unspecified disorder of synovium and tendon, left shoulder: Secondary | ICD-10-CM | POA: Diagnosis not present

## 2023-12-08 NOTE — Progress Notes (Unsigned)
    SUBJECTIVE:   CHIEF COMPLAINT / HPI:   Foot laceration Had sutures in ED on R great toe ~ 2 weeks ago when broken glass cut her. Doing well today, no drainage,  HTN *** consider olmasartan - hydrochlorothiazide ***  Back pain Chronic but flaired up after recently being in the car traveling with family and lifting suite cases. Has taken naproxen today, has 8 tablets left. Also taking tramadol and flexeril prn. No urinary or bowel issues, no bottom numbness  PERTINENT  PMH / PSH: ***  OBJECTIVE:   BP 114/64   Pulse 67   Ht 5\' 6"  (1.676 m)   Wt 253 lb (114.8 kg)   SpO2 95%   BMI 40.84 kg/m    General: NAD, pleasant, able to participate in exam Cardiac: RRR, no murmurs. Respiratory: CTAB, normal effort, No wheezes, rales or rhonchi Abdomen: Bowel sounds present, nontender, nondistended, no hepatosplenomegaly. Extremities: no edema or cyanosis. Skin: warm and dry, no rashes noted Neuro: alert, no obvious focal deficits Psych: Normal affect and mood  ASSESSMENT/PLAN:   No problem-specific Assessment & Plan notes found for this encounter.     Dr. Erick Alley, DO [place] [facility]    {    This will disappear when note is signed, click to select method of visit    :1}

## 2023-12-09 ENCOUNTER — Ambulatory Visit (INDEPENDENT_AMBULATORY_CARE_PROVIDER_SITE_OTHER): Payer: 59 | Admitting: Student

## 2023-12-09 ENCOUNTER — Encounter: Payer: Self-pay | Admitting: Student

## 2023-12-09 VITALS — BP 114/64 | HR 67 | Ht 66.0 in | Wt 253.0 lb

## 2023-12-09 DIAGNOSIS — Z4802 Encounter for removal of sutures: Secondary | ICD-10-CM

## 2023-12-09 DIAGNOSIS — I10 Essential (primary) hypertension: Secondary | ICD-10-CM | POA: Diagnosis not present

## 2023-12-09 DIAGNOSIS — M545 Low back pain, unspecified: Secondary | ICD-10-CM | POA: Diagnosis not present

## 2023-12-09 MED ORDER — NAPROXEN 500 MG PO TABS: 500.0000 mg | ORAL_TABLET | Freq: Two times a day (BID) | ORAL | 0 refills | Status: DC

## 2023-12-09 MED ORDER — OLMESARTAN MEDOXOMIL-HCTZ 20-12.5 MG PO TABS: 1.0000 | ORAL_TABLET | Freq: Every day | ORAL | 0 refills | Status: DC

## 2023-12-09 NOTE — Patient Instructions (Signed)
It was great to see you! Thank you for allowing me to participate in your care!  I recommend that you always bring your medications to each appointment as this makes it easy to ensure you are on the correct medications and helps [place] not miss when refills are needed.  Our plans for today:  - I sent in a refill of naproxen - You can try over the counter lidocaine patches for back pain as well - I sent in a new medications for blood pressure: Benicar HCT, a combination pill.  - STOP taking hydrochlorothiazide and lisinopril and instead take the combo pill - return in ~ 2 weeks for blood pressure check with me    Take care and seek immediate care sooner if you develop any concerns.   Dr. Erick Alley, DO [facility]

## 2023-12-10 DIAGNOSIS — Z4802 Encounter for removal of sutures: Secondary | ICD-10-CM | POA: Insufficient documentation

## 2023-12-10 NOTE — Assessment & Plan Note (Addendum)
Currently taking lisinopril and HCTZ.  Well-controlled.  Will switch to Benicar-HCT 20-12.5 mg daily to simplify med list.  Return in ~2 weeks for BP check and BMP

## 2023-12-10 NOTE — Assessment & Plan Note (Addendum)
4 sutures of right great toe removed without complication.  No signs of infection and laceration healing appropriately.  Band-Aid applied per patient request.

## 2023-12-10 NOTE — Assessment & Plan Note (Signed)
Acutely flared after lifting suitcases/traveling in a car.  Pain only reproducible with movement and consistent with her chronic pain. -Continue as needed tramadol and Flexeril -Provided new Rx for naproxen, advised to take with food and for no more than 2 weeks at a time -Can try OTC lidocaine patch

## 2023-12-23 ENCOUNTER — Ambulatory Visit (INDEPENDENT_AMBULATORY_CARE_PROVIDER_SITE_OTHER): Payer: 59 | Admitting: Student

## 2023-12-23 ENCOUNTER — Encounter: Payer: Self-pay | Admitting: Student

## 2023-12-23 VITALS — BP 127/84 | HR 84 | Temp 98.1°F | Wt 244.4 lb

## 2023-12-23 DIAGNOSIS — J069 Acute upper respiratory infection, unspecified: Secondary | ICD-10-CM

## 2023-12-23 DIAGNOSIS — I159 Secondary hypertension, unspecified: Secondary | ICD-10-CM

## 2023-12-23 DIAGNOSIS — I10 Essential (primary) hypertension: Secondary | ICD-10-CM

## 2023-12-23 MED ORDER — ALBUTEROL SULFATE (2.5 MG/3ML) 0.083% IN NEBU: 2.5000 mg | INHALATION_SOLUTION | Freq: Four times a day (QID) | RESPIRATORY_TRACT | 1 refills | Status: DC | PRN

## 2023-12-23 NOTE — Assessment & Plan Note (Signed)
 BP 127/84, well-controlled today. Continue current regimen (Benicar- hydrochlorothiazide 20-12.5 mg). BMP collected today to ensure renal tolerance/check electrolytes.

## 2023-12-23 NOTE — Patient Instructions (Signed)
 It was great to see you! Thank you for allowing me to participate in your care!  Our plans for today:  -Your blood pressure looks great today.  Please continue take your medicine as you are.  We did not make any changes. -I sent your albuterol  nebulizer solution to your pharmacy.  You likely have a viral illness.  However, if you develop worsening symptoms over the next 5 days (fever, chills, worsening shortness of breath), then you need to be reevaluated. -Continue to rest, hydrate, use honey as needed for cough  We are checking some labs today, I will call you if they are abnormal will send you a MyChart message or a letter if they are normal.  If you do not hear about your labs in the next 2 weeks please let us  know.  Take care and seek immediate care sooner if you develop any concerns.   Dr. Jairy Angulo, DO [facility]

## 2023-12-23 NOTE — Progress Notes (Signed)
    SUBJECTIVE:   CHIEF COMPLAINT / HPI:   Roberta [name] is a 68 year old female here for hypertension follow-up and discuss sick symptoms.  Hypertension She was seen on 12/09/2023 and was taking lisinopril  and hydrochlorothiazide , with good blood pressure control.  She was switched to Benicar -HCTZ 20-12.5 to simplify her medication regimen.  She says that she feels like the new medication makes her pee more at night than her other tablets.  She denies any dysuria, hesitancy, or other UTI symptoms.  Due for BMP today given switch from ACE to ARB.  Sick symptoms She is having some viral URI symptoms, with cough, runny nose that started last Saturday. No nausea, vomiting, diarrhea. No dyspnea. She is using her albuterol  PRN, and her nebulizer. She is using honey, tea, lemon water  for cough which helps a lot. Son is able to help her at home  PERTINENT  PMH / PSH: Hypertension Asthma GERD Chronic back pain  OBJECTIVE:   BP 127/84   Pulse 84   Temp 98.1 F (36.7 C) (Oral)   Wt 244 lb 6.4 oz (110.9 kg)   SpO2 97%   BMI 39.45 kg/m   General: NAD, wears mask, ambulates independently, antalgic gait (due to chronic back pain) Cardiac: RRR Neuro: A&O Respiratory: normal WOB on RA. No wheezing or crackles on auscultation, good lung sounds throughout. No diminished lung sounds. Intermittent dry cough on exam. Extremities: Moving all 4 extremities equally    ASSESSMENT/PLAN:   HYPERTENSION, BENIGN SYSTEMIC BP 127/84, well-controlled today. Continue current regimen (Benicar - hydrochlorothiazide  20-12.5 mg). BMP collected today to ensure renal tolerance/check electrolytes.  Viral URI with cough Upper respiratory symptoms for 5 days. Generally well-appearing, lung sounds clear No concern for pneumonia, bacterial infection at this time Discussed supportive care/return precautions should she develop worsening shortness of breath, fever, chills, increased sputum production.  No smoking history.      Earnie Bechard, DO [facility]

## 2023-12-23 NOTE — Assessment & Plan Note (Signed)
 Upper respiratory symptoms for 5 days. Generally well-appearing, lung sounds clear No concern for pneumonia, bacterial infection at this time Discussed supportive care/return precautions should she develop worsening shortness of breath, fever, chills, increased sputum production. No smoking history.

## 2023-12-24 LAB — BASIC METABOLIC PANEL
BUN/Creatinine Ratio: 18 (ref 12–28)
BUN: 21 mg/dL (ref 8–27)
CO2: 25 mmol/L (ref 20–29)
Calcium: 9.2 mg/dL (ref 8.7–10.3)
Chloride: 100 mmol/L (ref 96–106)
Creatinine, Ser: 1.19 mg/dL — ABNORMAL HIGH (ref 0.57–1.00)
Glucose: 129 mg/dL — ABNORMAL HIGH (ref 70–99)
Potassium: 3.7 mmol/L (ref 3.5–5.2)
Sodium: 138 mmol/L (ref 134–144)
eGFR: 49 mL/min/{1.73_m2} — ABNORMAL LOW (ref >59)

## 2024-01-03 ENCOUNTER — Encounter: Payer: Self-pay | Admitting: Student

## 2024-01-06 ENCOUNTER — Other Ambulatory Visit: Payer: Self-pay | Admitting: Student

## 2024-01-06 DIAGNOSIS — I10 Essential (primary) hypertension: Secondary | ICD-10-CM

## 2024-01-09 ENCOUNTER — Ambulatory Visit (INDEPENDENT_AMBULATORY_CARE_PROVIDER_SITE_OTHER): Payer: 59 | Admitting: Student

## 2024-01-09 ENCOUNTER — Encounter: Payer: Self-pay | Admitting: Student

## 2024-01-09 VITALS — BP 133/81 | HR 71 | Ht 66.0 in | Wt 257.0 lb

## 2024-01-09 DIAGNOSIS — I10 Essential (primary) hypertension: Secondary | ICD-10-CM

## 2024-01-09 DIAGNOSIS — R7989 Other specified abnormal findings of blood chemistry: Secondary | ICD-10-CM

## 2024-01-09 NOTE — Progress Notes (Signed)
    SUBJECTIVE:   CHIEF COMPLAINT / HPI:   Roberta [name] is a 68 year old female here for follow-up of elevated creatinine on BMP on 12/23/2023.  Her baseline creatinine is around 0.95-1.0.  Creatinine on 01/19/2024 was 1.09.  Patient reported that she has some flulike symptoms with diarrhea with reduced p.o. fluid intake.  Does not take NSAIDs regularly. She did take a Flexeril and Tramadol yesterday for her back pain.  She feels well today. She takes her Olmesartan- hydrochlorothiazide 20-12.5 mg in the morning which she took this morning. Reports normal urinary output. No chest pain, shortness of breath, lower extremity swelling  Only OTC supplement she takes is Biotin.  PERTINENT  PMH / PSH:  Hypertension Chronic back pain Hyperlipidemia Anxiety  OBJECTIVE:   BP 133/81   Pulse 71   Ht 5\' 6"  (1.676 m)   Wt 257 lb (116.6 kg)   SpO2 100%   BMI 41.48 kg/m   General: NAD, well appearing, obese Cardiac: RRR Neuro: A&O Respiratory: normal WOB on RA. No wheezing or crackles on auscultation, good lung sounds throughout Extremities: Moving all 4 extremities equally  ASSESSMENT/PLAN:   Elevated serum creatinine On 01/19/2024 creatinine 1.19.  No electrolyte derangements I do not think this is secondary to her ARB, she has been on this for a long time Likely prerenal in nature given reduced p.o. intake with GI losses. Reassuringly, no excessive NSAID use or over-the-counter supplements. Recheck BMP today, will call patient if abnormal and/or need to make medication adjustments  HYPERTENSION, BENIGN SYSTEMIC BP initially elevated 141/77.  Recheck closer to goal at 133/81 Repeat BMP today Continue Benicar, follow back up with PCP at next scheduled visit     Darral Dash, DO [facility]

## 2024-01-09 NOTE — Assessment & Plan Note (Signed)
BP initially elevated 141/77.  Recheck closer to goal at 133/81 Repeat BMP today Continue Benicar, follow back up with PCP at next scheduled visit

## 2024-01-09 NOTE — Assessment & Plan Note (Signed)
On 01/19/2024 creatinine 1.19.  No electrolyte derangements I do not think this is secondary to her ARB, she has been on this for a long time Likely prerenal in nature given reduced p.o. intake with GI losses. Reassuringly, no excessive NSAID use or over-the-counter supplements. Recheck BMP today, will call patient if abnormal and/or need to make medication adjustments

## 2024-01-09 NOTE — Patient Instructions (Signed)
It was great seeing you today.  As we discussed, -We are rechecking your blood work today.  I will call you if we need to make any changes. -Please make sure to take your blood pressure medications as prescribed, every single day.  I recommend making an alarm on your phone to remember to take them. -Drink plenty of fluids to the stay hydrated   If you have any questions or concerns, please feel free to call the clinic.   Have a wonderful day,  Dr. Darral Dash [facility] [phone]

## 2024-01-10 ENCOUNTER — Other Ambulatory Visit: Payer: Self-pay | Admitting: Student

## 2024-01-10 ENCOUNTER — Encounter: Payer: Self-pay | Admitting: Student

## 2024-01-10 DIAGNOSIS — K219 Gastro-esophageal reflux disease without esophagitis: Secondary | ICD-10-CM

## 2024-01-10 DIAGNOSIS — I82432 Acute embolism and thrombosis of left popliteal vein: Secondary | ICD-10-CM

## 2024-01-10 LAB — BASIC METABOLIC PANEL
BUN/Creatinine Ratio: 19 (ref 12–28)
BUN: 19 mg/dL (ref 8–27)
CO2: 25 mmol/L (ref 20–29)
Calcium: 9.4 mg/dL (ref 8.7–10.3)
Chloride: 103 mmol/L (ref 96–106)
Creatinine, Ser: 1 mg/dL (ref 0.57–1.00)
Glucose: 139 mg/dL — ABNORMAL HIGH (ref 70–99)
Potassium: 4.2 mmol/L (ref 3.5–5.2)
Sodium: 140 mmol/L (ref 134–144)
eGFR: 61 mL/min/{1.73_m2} (ref >59)

## 2024-01-16 DIAGNOSIS — D492 Neoplasm of unspecified behavior of bone, soft tissue, and skin: Secondary | ICD-10-CM | POA: Diagnosis not present

## 2024-01-16 DIAGNOSIS — L03032 Cellulitis of left toe: Secondary | ICD-10-CM | POA: Diagnosis not present

## 2024-01-16 DIAGNOSIS — I739 Peripheral vascular disease, unspecified: Secondary | ICD-10-CM | POA: Diagnosis not present

## 2024-01-16 DIAGNOSIS — M7989 Other specified soft tissue disorders: Secondary | ICD-10-CM | POA: Diagnosis not present

## 2024-01-16 DIAGNOSIS — L03031 Cellulitis of right toe: Secondary | ICD-10-CM | POA: Diagnosis not present

## 2024-01-16 DIAGNOSIS — B351 Tinea unguium: Secondary | ICD-10-CM | POA: Diagnosis not present

## 2024-01-24 ENCOUNTER — Ambulatory Visit (INDEPENDENT_AMBULATORY_CARE_PROVIDER_SITE_OTHER): Payer: 59

## 2024-01-24 ENCOUNTER — Ambulatory Visit (HOSPITAL_COMMUNITY)
Admission: RE | Admit: 2024-01-24 | Discharge: 2024-01-24 | Disposition: A | Discharge status: Home / Self Care | Payer: 59 | Source: Ambulatory Visit | Attending: Family Medicine | Admitting: Family Medicine

## 2024-01-24 ENCOUNTER — Telehealth (HOSPITAL_COMMUNITY): Payer: Self-pay

## 2024-01-24 ENCOUNTER — Ambulatory Visit (HOSPITAL_BASED_OUTPATIENT_CLINIC_OR_DEPARTMENT_OTHER)
Admission: RE | Admit: 2024-01-24 | Discharge: 2024-01-24 | Disposition: A | Discharge status: Home / Self Care | Payer: 59 | Source: Ambulatory Visit | Attending: Vascular Surgery | Admitting: Vascular Surgery

## 2024-01-24 ENCOUNTER — Other Ambulatory Visit (HOSPITAL_COMMUNITY): Payer: Self-pay

## 2024-01-24 VITALS — BP 122/75 | HR 74 | Ht 66.0 in | Wt 249.8 lb

## 2024-01-24 VITALS — BP 152/66 | HR 71

## 2024-01-24 DIAGNOSIS — I82451 Acute embolism and thrombosis of right peroneal vein: Secondary | ICD-10-CM | POA: Insufficient documentation

## 2024-01-24 DIAGNOSIS — M79661 Pain in right lower leg: Secondary | ICD-10-CM | POA: Insufficient documentation

## 2024-01-24 MED ORDER — APIXABAN (ELIQUIS) VTE STARTER PACK (10MG AND 5MG)
ORAL_TABLET | ORAL | 0 refills | Status: DC
  Filled 2024-01-24: qty 74, 28d supply, fill #0

## 2024-01-24 MED ORDER — APIXABAN 5 MG PO TABS: 5.0000 mg | ORAL_TABLET | Freq: Two times a day (BID) | ORAL | 4 refills | Status: DC

## 2024-01-24 NOTE — Patient Instructions (Addendum)
 It was great to see you! Thank you for allowing me to participate in your care!  Our plans for today:  - You may use Tylenol  as needed for pain. - Please do not wear your compression stocking until we get the results back from you ultrasound. - I have provided exercises for you calf.    Please arrive 15 minutes PRIOR to your next scheduled appointment time! If you do not, this affects OTHER patients' care.  Take care and seek immediate care sooner if you develop any concerns.   Ozell Provencal, MD, PGY-2 [facility] 9:21 AM 01/24/2024  [facility]

## 2024-01-24 NOTE — Patient Instructions (Addendum)
-  Start apixaban  (Eliquis ) 10 mg twice daily for 7 days followed by 5 mg twice daily. -Your refills have been sent to your [facility]. You may need to call the pharmacy to ask them to fill this when you start to run low on your current supply.  -You've been referred to hematology. If you haven't heard from them to schedule your appointment in the next week, you can call [phone].  -It is important to take your medication around the same time every day.  -Avoid NSAIDs like ibuprofen  (Advil , Motrin ) and naproxen  (Aleve ) as well as aspirin  doses over 100 mg daily. -Tylenol  (acetaminophen ) is the preferred over the counter pain medication to lower the risk of bleeding. -Be sure to alert all of your health care providers that you are taking an anticoagulant prior to starting a new medication or having a procedure. -Monitor for signs and symptoms of bleeding (abnormal bruising, prolonged bleeding, nose bleeds, bleeding from gums, discolored urine, black tarry stools). If you have fallen and hit your head OR if your bleeding is severe or not stopping, seek emergency care.  -Go to the emergency room if emergent signs and symptoms of new clot occur (new or worse swelling and pain in an arm or leg, shortness of breath, chest pain, fast or irregular heartbeats, lightheadedness, dizziness, fainting, coughing up blood) or if you experience a significant color change (pale or blue) in the extremity that has the DVT.  -We recommend you wear compression stockings (20-30 mmHg) as long as you are having swelling or pain. Be sure to purchase the correct size and take them off at night.   If you have any questions or need to reschedule an appointment, please call [phone] [facility].  If you are having an emergency, call 911 or present to the nearest emergency room.   What is a DVT?  -Deep vein thrombosis (DVT) is a condition in which a blood clot forms in a vein of the deep venous system which can occur in the  lower leg, thigh, pelvis, arm, or neck. This condition is serious and can be life-threatening if the clot travels to the arteries of the lungs and causing a blockage (pulmonary embolism, PE). A DVT can also damage veins in the leg, which can lead to long-term venous disease, leg pain, swelling, discoloration, and ulcers or sores (post-thrombotic syndrome).  -Treatment may include taking an anticoagulant medication to prevent more clots from forming and the current clot from growing, wearing compression stockings, and/or surgical procedures to remove or dissolve the clot.

## 2024-01-24 NOTE — Progress Notes (Signed)
 DVT Clinic Note  Name: Roberta [name]     MRN: [id number]     DOB: Jun 29, 1956     Sex: female  PCP: [name], DO  Today's Visit: Visit Information: Initial Visit  Referred to DVT Clinic by: Primary Care - Dr. Donzetta Samuel [facility]) Referred to CPP by: Dr. Pearline Reason for referral:  Chief Complaint  Patient presents with   DVT   HISTORY OF PRESENT ILLNESS: Roberta [name] is a 68 y.o. female with PMH LLE DVT (07/2022), HTN, hyperlipidemia, obesity, who presents after diagnosis of DVT for medication management. Patient was seen in her PCP's office today for right calf pain ongoing for the past 2 weeks. Ultrasound showed acute DVT in the right peroneal veins, and she was referred to DVT Clinic to start treatment.   Today, patient is ambulating well with the use of a cane. She reports feeling anxious and is tearful today regarding her new DVT diagnosis. She was sick with the flu at the beginning of January and reports not getting out of bed except to eat or use the bathroom for 2-3 weeks. She was also dehydrated during that time and lost 10 lbs. She began to have cramping in her right leg about 2 weeks ago. No swelling but pain does improve with elevation. Reports that a history of LLE superficial vein thrombosis in the 1990s, which she says was caused by laying her leg a certain way that cut off circulation. She more recently had a LLE DVT 07/2022, which she reports was related to 8 hours of car travel in one day while wearing a wrap on her leg that was too tight, cutting off circulation. Prior DVT was treated with Eliquis  which she tolerated well. She has a history of endometrial cancer s/p total hysterectomy. She is up to date on her cancer screenings. No chest pain, SOB, tachycardia, palpitations.   Positive Thrombotic Risk Factors: Previous VTE, Bed rest >72 hours within 3 month, Obesity, Older Age Bleeding Risk Factors: Age >65 years, Antiplatelet therapy  Negative Thrombotic  Risk Factors: Recent surgery (within 3 months), Recent trauma (within 3 months), Recent admission to hospital with acute illness (within 3 months), Paralysis, paresis, or recent plaster cast immobilization of lower extremity, Central venous catheterization, Sedentary journey lasting >8 hours within 4 weeks, Pregnancy, Within 6 weeks postpartum, Recent cesarean section (within 3 months), Estrogen therapy, Testosterone therapy, Erythropoiesis-stimulating agent, Recent COVID diagnosis (within 3 months), Active cancer, Non-malignant, chronic inflammatory condition, Known thrombophilic condition, Smoking  Rx Insurance Coverage:  Medicaid Medicare Rx Affordability: Eliquis  is $0 per 30 or 90 day supply.  Preferred Pharmacy: Starter pack filled at on site [facility]. Refills sent to [facility]'s preferred [facility].   Past Medical History:  Diagnosis Date   Abnormal EKG    nonspecific ST and T-wave changes - Followed by Dr.Berry   ALLERGIC RHINITIS 10/14/2010   Anemia    Asthma    ASTHMA, INTERMITTENT 02/16/2007   BACK PAIN, CHRONIC 03/25/2009   CERVICAL RADICULOPATHY 10/16/2009   DDD (degenerative disc disease), lumbosacral    Depression    DEPRESSIVE DISORDER NOT ELSEWHERE CLASSIFIED 03/25/2009   Endometrial cancer (HCC)    GERD (gastroesophageal reflux disease) 09/09/2011   Glaucoma    sees optho every 3 months, drops each night   Hyperlipidemia    Hypertension    PONV (postoperative nausea and vomiting)    Postmenopausal vaginal bleeding 09/24/2014   Viral URI with cough 03/28/2013    Past Surgical History:  Procedure Laterality Date   ANKLE SURGERY     BIOPSY BREAST     LEFT   BREAST CYST INCISION AND DRAINAGE     under breast   BUNIONECTOMY     bilateral and toe correction   CARPAL TUNNEL RELEASE Bilateral 2009   CATARACT SURGERY     CERVICAL SPINE SURGERY     CHOLECYSTECTOMY  1980   DILATATION & CURRETTAGE/HYSTEROSCOPY WITH RESECTOCOPE N/A 07/28/2015   Procedure: DILATATION  & CURETTAGE/HYSTEROSCOPY WITH RESECTOCOPE;  Surgeon: Shanda SHAUNNA Muscat, MD;  Location: [facility];  Service: Gynecology;  Laterality: N/A;   KNEE ARTHROSCOPY     ROBOTIC ASSISTED TOTAL HYSTERECTOMY WITH BILATERAL SALPINGO OOPHERECTOMY Bilateral 09/04/2015   Procedure: ROBOTIC ASSISTED HYSTERECTOMY WITH BILATERAL SALPINGO OOPHORECTOMY SENTINAL NODE MAPPING ;  Surgeon: Maurilio Ship, MD;  Location: [facility];  Service: Gynecology;  Laterality: Bilateral;   ROTATOR CUFF REPAIR Right May 2014    Social History   Socioeconomic History   Marital status: Divorced    Spouse name: Not on file   Number of children: 4   Years of education: 16   Highest education level: Associate degree: occupational, scientist, product/process development, or vocational program  Occupational History   Occupation: Leisure Centre Manager: UNEMPLOYED   Occupation: Retired    Comment: Arts Administrator- troubleshoots  Tobacco Use   Smoking status: Never    Passive exposure: Never   Smokeless tobacco: Never  Vaping Use   Vaping status: Never Used  Substance and Sexual Activity   Alcohol use: No    Alcohol/week: 0.0 standard drinks of alcohol   Drug use: No   Sexual activity: Not Currently  Other Topics Concern   Not on file  Social History Narrative   Patient lives alone in [place].   Patient is very close with her 4 grown children and her grandchildren.   Patient enjoys puzzles, building Lego cities, crafts, knitting blankets, and bible studies at home.    Patient doesn't eat pork and eats very little red meat          Social Drivers of Corporate Investment Banker Strain: Low Risk  (12/06/2023)   Overall Financial Resource Strain (CARDIA)    Difficulty of Paying Living Expenses: Not very hard  Food Insecurity: Food Insecurity Present (12/06/2023)   Hunger Vital Sign    Worried About Running Out of Food in the Last Year: Sometimes true    Ran Out of Food in the Last Year: Sometimes true  Transportation Needs: No Transportation Needs (12/06/2023)    PRAPARE - Administrator, Civil Service (Medical): No    Lack of Transportation (Non-Medical): No  Physical Activity: Insufficiently Active (12/06/2023)   Exercise Vital Sign    Days of Exercise per Week: 1 day    Minutes of Exercise per Session: 60 min  Stress: Patient Declined (12/06/2023)   [employer] of Occupational Health - Occupational Stress Questionnaire    Feeling of Stress : Patient declined  Social Connections: Unknown (12/06/2023)   Social Connection and Isolation Panel [NHANES]    Frequency of Communication with Friends and Family: Three times a week    Frequency of Social Gatherings with Friends and Family: Once a week    Attends Religious Services: More than 4 times per year    Active Member of [employer] or Organizations: Yes    Attends Engineer, Structural: More than 4 times per year    Marital Status: Patient declined  Intimate Partner Violence: Not At  Risk (04/28/2023)   Humiliation, Afraid, Rape, and Kick questionnaire    Fear of Current or Ex-Partner: No    Emotionally Abused: No    Physically Abused: No    Sexually Abused: No    Family History  Problem Relation Age of Onset   Lymphoma Mother        related to asbestos   Cancer Mother        lymphoma (asbestos exposure)   Alcoholism Father    Cirrhosis Father        due to alcohol   Asthma Father    Cancer Brother    Cancer Maternal Uncle        lung   Cancer Maternal Uncle        lung    Allergies as of 01/24/2024 - Review Complete 01/24/2024  Allergen Reaction Noted   Influenza vaccines Anaphylaxis 12/10/2013   Budesonide Other (See Comments)    Flonase [fluticasone propionate] Other (See Comments) 08/19/2015   Hydrocodone-acetaminophen  Nausea And Vomiting 01/20/2022   Morphine and codeine Nausea And Vomiting 05/22/2015   Nickel Rash 08/19/2015   Phenobarbital Hypertension and Rash 01/20/2022    Current Outpatient Medications on File Prior to Encounter  Medication Sig  Dispense Refill   ASPIRIN  LOW DOSE 81 MG tablet TAKE 1 TABLET(81 MG) BY MOUTH DAILY. SWALLOW WHOLE 30 tablet 12   BIOTIN PO Take 1 capsule by mouth daily.     cetirizine  (ZYRTEC ) 10 MG tablet Take 10 mg by mouth daily.     cyclobenzaprine  (FLEXERIL ) 10 MG tablet Take 10 mg by mouth daily as needed for muscle spasms.     dorzolamide -timolol  (COSOPT ) 22.3-6.8 MG/ML ophthalmic solution Place 1 drop into both eyes 2 (two) times daily. 10 mL 12   esomeprazole  (NEXIUM ) 20 MG capsule TAKE 1 CAPSULE(20 MG) BY MOUTH DAILY AS NEEDED FOR STOMACH ACID OR REFLUX (Patient taking differently: Take 20 mg by mouth daily.) 90 capsule 1   meclizine  (ANTIVERT ) 12.5 MG tablet TAKE 1 TABLET(12.5 MG) BY MOUTH THREE TIMES DAILY AS NEEDED 90 tablet 0   olmesartan -hydrochlorothiazide  (BENICAR  HCT) 20-12.5 MG tablet TAKE 1 TABLET BY MOUTH DAILY 30 tablet 0   ondansetron  (ZOFRAN ) 4 MG tablet TAKE 1 TABLET BY MOUTH EVERY 8 HOURS AS NEEDED FOR NAUSEA OR VOMITING 20 tablet 0   rosuvastatin  (CRESTOR ) 10 MG tablet Take 1 tablet (10 mg total) by mouth daily. 90 tablet 3   traMADol  (ULTRAM ) 50 MG tablet Take 50 mg by mouth daily as needed for moderate pain (pain score 4-6).     Travoprost , BAK Free, (TRAVATAN ) 0.004 % SOLN ophthalmic solution Place 1 drop into both eyes at bedtime. 2.5 mL 3   albuterol  (PROAIR  HFA) 108 (90 Base) MCG/ACT inhaler Inhale 2 puffs into the lungs every 4 (four) hours as needed. 18 g 3   albuterol  (PROVENTIL ) (2.5 MG/3ML) 0.083% nebulizer solution Take 3 mLs (2.5 mg total) by nebulization every 6 (six) hours as needed for wheezing or shortness of breath. 75 mL 1   No current facility-administered medications on file prior to encounter.   REVIEW OF SYSTEMS:  Review of Systems  Respiratory:  Negative for shortness of breath.   Cardiovascular:  Negative for chest pain, palpitations and leg swelling.  Musculoskeletal:  Positive for myalgias.  Neurological:  Negative for dizziness and tingling.   Psychiatric/Behavioral:  The patient is nervous/anxious.    PHYSICAL EXAMINATION:  Vitals:   01/24/24 1516  BP: (!) 152/66  Pulse: 71  SpO2: 99%  Physical Exam Vitals reviewed.  Cardiovascular:     Rate and Rhythm: Normal rate.  Pulmonary:     Effort: Pulmonary effort is normal.  Musculoskeletal:        General: Swelling (mild edema bilaterally, at baseline per patient) and tenderness present.  Skin:    Findings: No bruising or erythema.   Villalta Score for Post-Thrombotic Syndrome: Pain: Absent Cramps: Mild Heaviness: Mild Paresthesia: Absent Pruritus: Absent Pretibial Edema: Mild Skin Induration: Absent Hyperpigmentation: Absent Redness: Absent Venous Ectasia: Absent Pain on calf compression: Mild Villalta Preliminary Score: 4 Is venous ulcer present?: No If venous ulcer is present and score is <15, then 15 points total are assigned: Absent Villalta Total Score: 4  LABS:  CBC     Component Value Date/Time   WBC 6.5 10/24/2023 1601   RBC 4.93 10/24/2023 1601   HGB 12.6 10/24/2023 1601   HGB 12.7 03/11/2020 1040   HCT 43.2 10/24/2023 1601   HCT 39.7 03/11/2020 1040   PLT 303 10/24/2023 1601   PLT 316 03/11/2020 1040   MCV 87.6 10/24/2023 1601   MCV 81 03/11/2020 1040   MCH 25.6 (L) 10/24/2023 1601   MCHC 29.2 (L) 10/24/2023 1601   RDW 14.6 10/24/2023 1601   RDW 13.7 03/11/2020 1040   LYMPHSABS 3.2 10/24/2023 1601   LYMPHSABS 2.8 10/26/2018 1615   MONOABS 0.6 10/24/2023 1601   EOSABS 0.1 10/24/2023 1601   EOSABS 0.2 10/26/2018 1615   BASOSABS 0.0 10/24/2023 1601   BASOSABS 0.0 10/26/2018 1615    Hepatic Function      Component Value Date/Time   PROT 7.5 10/24/2023 1601   PROT 7.3 10/05/2017 1431   ALBUMIN 3.6 10/24/2023 1601   ALBUMIN 4.1 10/05/2017 1431   AST 14 (L) 10/24/2023 1601   ALT 13 10/24/2023 1601   ALKPHOS 74 10/24/2023 1601   BILITOT 0.3 10/24/2023 1601   BILITOT 0.3 10/05/2017 1431    Renal Function   Lab Results  Component  Value Date   CREATININE 1.00 01/09/2024   CREATININE 1.19 (H) 12/23/2023   CREATININE 1.00 11/07/2023    Estimated Creatinine Clearance: 67.8 mL/min (by C-G formula based on SCr of 1 mg/dL).   VVS Vascular Lab Studies:  01/24/24 VAS US  LOWER EXTREMITY VENOUS (DVT) Summary:  RIGHT:  - Findings consistent with acute deep vein thrombosis involving the right  peroneal veins.  - No cystic structure found in the popliteal fossa.    LEFT:  - No evidence of common femoral vein obstruction.    07/23/22 VAS US  LOWER EXTREMITY VENOUS LEFT (DVT) Summary:  RIGHT:  - No evidence of common femoral vein obstruction.    LEFT:  - Findings consistent with acute deep vein thrombosis involving the left  popliteal vein, and left gastrocnemius veins. Popliteal vein thrombus  observed to be mobile and poorly attached.  - Findings consistent with acute superficial vein thrombosis involving the  left small saphenous vein.  - No cystic structure found in the popliteal fossa.   ASSESSMENT: Location of DVT: Right distal vein Cause of DVT: provoked by a transient risk factor - recent prolonged immobility and dehydration related to influenza illness. Will initiate anticoagulation with Eliquis . No concerns on recent labs for Eliquis  start. The VTE starter pack was filled during her visit today, and she elected to take her first dose during the visit. Refills have been sent to her preferred pharmacy. Counseled patient extensively on the medication, and all of her questions have been answered. No concerns for  medication access or adherence at this time.   Even though provoking risk factors are present, in light of this being the patient's third clot (superficial thrombus in the 1990s, LLE DVT in 2023, now RLE DVT in 2025), I think she would benefit from evaluation with hematology for further workup and determination of duration of treatment. The patient also requested seeing hematology to determine why she is  susceptible to clotting. Of note, the patient does take aspirin  81 mg daily. She was taking this at the time of her DVT in 2023 and it was held when Eliquis  was started then resumed upon completion of treatment. Patient reports that she felt more symptoms in her left leg related to her prior superficial clots when she stopped aspirin  previously and would like to continue it. Okay to continue aspirin  since it's low dose (81 mg daily) but would discontinue this if she experiences any significant bruising or bleeding together with Eliquis , and she can discuss this further with her PCP if needed. She is aware to not take any other NSAIDs while on Eliquis .   PLAN: -Start apixaban  (Eliquis ) 10 mg twice daily for 7 days followed by 5 mg twice daily. -Expected duration of therapy: per hematology. Therapy started on 01/24/24. -Patient educated on purpose, proper use and potential adverse effects of apixaban  (Eliquis ). -Discussed importance of taking medication around the same time every day. -Advised patient of medications to avoid (NSAIDs, aspirin  doses >100 mg daily). -Educated that Tylenol  (acetaminophen ) is the preferred analgesic to lower the risk of bleeding. -Advised patient to alert all providers of anticoagulation therapy prior to starting a new medication or having a procedure. -Emphasized importance of monitoring for signs and symptoms of bleeding (abnormal bruising, prolonged bleeding, nose bleeds, bleeding from gums, discolored urine, black tarry stools). -Educated patient to present to the ED if emergent signs and symptoms of new thrombosis occur. -Counseled patient to wear compression stockings daily, removing at night. Elevate legs daily to help improve swelling.   Follow up: Referral to hematology placed. DVT Clinic available as needed.   Lum Herald, PharmD, JAQUELINE, CPP Deep Vein Thrombosis Clinic Clinical Pharmacist Practitioner Office: [phone]

## 2024-01-24 NOTE — Progress Notes (Signed)
    SUBJECTIVE:   CHIEF COMPLAINT / HPI:   Right calf pain. Ongoing for 2 weeks. Worse at night. No redness or swelling. Was less active for long period of time when sick. No long drives or plane flights. Hurts mildly with walking. Had to use cane yesterday. Elevated helps with pain. No fevers. No chest pain or shortness of breath. Describes as feeling sore   PERTINENT  PMH / PSH: HTN, Hx of left DVT, Endometrial cancer  OBJECTIVE:   BP 122/75   Pulse 74   Ht 5' 6 (1.676 m)   Wt 249 lb 12.8 oz (113.3 kg)   SpO2 98%   BMI 40.32 kg/m   General: NAD Neuro: A&O Cardiovascular: RRR, no murmurs, no peripheral edema Respiratory: normal WOB on RA, CTAB, no wheezes, ronchi or rales Extremities: Moving all 4 extremities equally Right calf: body of gastroc tender and myotendinous insertion, no erythema, trace pitting edema at ankle, strength 5/5 all ankle directions, non tender at achilles insertion, negative Homan's sign   ASSESSMENT/PLAN:   Assessment & Plan Right calf pain Suspect that patient's pain is due to myotendinous junction Achilles tendinitis and chronic swelling.  However given prior DVT in the left and patient risk factors, I cannot rule out DVT.  Patient agreeable to scheduling right lower extremity DVT ultrasound.  Pending DVT results counseled on starting compression stockings on that side versus DVT treatment.  Provided with Achilles tendinitis rehab and stretching.  Return in about 6 weeks (around 03/06/2024).  Ozell Provencal, MD [facility]

## 2024-01-24 NOTE — Telephone Encounter (Signed)
Patient Advocate Encounter  Test billing for Eliquis returns a $0 copay for 90 days, using the current coverage.  Burnell Blanks, CPhT Rx Patient Advocate Phone: [phone]

## 2024-01-25 ENCOUNTER — Telehealth: Payer: Self-pay | Admitting: Family Medicine

## 2024-01-25 NOTE — Telephone Encounter (Signed)
 Patient DVT US  yesterday positive. Immediately treated by vascular team. Started on Eliquis  10mg  BID for 1 week followed by 5mg  BID. Hematology referral sent by vascular team. Patient has no current questions regarding plan.

## 2024-02-01 DIAGNOSIS — M25512 Pain in left shoulder: Secondary | ICD-10-CM | POA: Diagnosis not present

## 2024-02-07 ENCOUNTER — Other Ambulatory Visit: Payer: Self-pay | Admitting: Student

## 2024-02-07 DIAGNOSIS — I10 Essential (primary) hypertension: Secondary | ICD-10-CM

## 2024-02-14 ENCOUNTER — Encounter: Payer: Self-pay | Admitting: Student

## 2024-02-14 DIAGNOSIS — M19012 Primary osteoarthritis, left shoulder: Secondary | ICD-10-CM | POA: Diagnosis not present

## 2024-02-14 DIAGNOSIS — M75112 Incomplete rotator cuff tear or rupture of left shoulder, not specified as traumatic: Secondary | ICD-10-CM | POA: Diagnosis not present

## 2024-02-17 ENCOUNTER — Inpatient Hospital Stay: Payer: 59

## 2024-02-17 ENCOUNTER — Encounter: Payer: Self-pay | Admitting: Hematology and Oncology

## 2024-02-17 ENCOUNTER — Telehealth: Payer: Self-pay | Admitting: *Deleted

## 2024-02-17 ENCOUNTER — Inpatient Hospital Stay (HOSPITAL_BASED_OUTPATIENT_CLINIC_OR_DEPARTMENT_OTHER): Payer: 59 | Attending: Hematology and Oncology | Admitting: Hematology and Oncology

## 2024-02-17 VITALS — BP 150/98 | HR 66 | Temp 97.3°F | Resp 14 | Wt 256.7 lb

## 2024-02-17 DIAGNOSIS — Z800 Family history of malignant neoplasm of digestive organs: Secondary | ICD-10-CM | POA: Diagnosis not present

## 2024-02-17 DIAGNOSIS — Z7982 Long term (current) use of aspirin: Secondary | ICD-10-CM | POA: Diagnosis not present

## 2024-02-17 DIAGNOSIS — Z807 Family history of other malignant neoplasms of lymphoid, hematopoietic and related tissues: Secondary | ICD-10-CM | POA: Diagnosis not present

## 2024-02-17 DIAGNOSIS — Z801 Family history of malignant neoplasm of trachea, bronchus and lung: Secondary | ICD-10-CM | POA: Insufficient documentation

## 2024-02-17 DIAGNOSIS — J4520 Mild intermittent asthma, uncomplicated: Secondary | ICD-10-CM | POA: Diagnosis not present

## 2024-02-17 DIAGNOSIS — I10 Essential (primary) hypertension: Secondary | ICD-10-CM | POA: Insufficient documentation

## 2024-02-17 DIAGNOSIS — Z7901 Long term (current) use of anticoagulants: Secondary | ICD-10-CM | POA: Insufficient documentation

## 2024-02-17 DIAGNOSIS — M79606 Pain in leg, unspecified: Secondary | ICD-10-CM | POA: Diagnosis not present

## 2024-02-17 DIAGNOSIS — G4733 Obstructive sleep apnea (adult) (pediatric): Secondary | ICD-10-CM | POA: Diagnosis not present

## 2024-02-17 DIAGNOSIS — R519 Headache, unspecified: Secondary | ICD-10-CM | POA: Insufficient documentation

## 2024-02-17 DIAGNOSIS — K219 Gastro-esophageal reflux disease without esophagitis: Secondary | ICD-10-CM | POA: Diagnosis not present

## 2024-02-17 DIAGNOSIS — Z79899 Other long term (current) drug therapy: Secondary | ICD-10-CM | POA: Diagnosis not present

## 2024-02-17 DIAGNOSIS — I82451 Acute embolism and thrombosis of right peroneal vein: Secondary | ICD-10-CM

## 2024-02-17 DIAGNOSIS — K5900 Constipation, unspecified: Secondary | ICD-10-CM | POA: Diagnosis not present

## 2024-02-17 DIAGNOSIS — E785 Hyperlipidemia, unspecified: Secondary | ICD-10-CM | POA: Insufficient documentation

## 2024-02-17 DIAGNOSIS — Z86718 Personal history of other venous thrombosis and embolism: Secondary | ICD-10-CM | POA: Diagnosis not present

## 2024-02-17 LAB — CBC WITH DIFFERENTIAL (CANCER CENTER ONLY)
Abs Immature Granulocytes: 0.03 10*3/uL (ref 0.00–0.07)
Basophils Absolute: 0 10*3/uL (ref 0.0–0.1)
Basophils Relative: 0 %
Eosinophils Absolute: 0.1 10*3/uL (ref 0.0–0.5)
Eosinophils Relative: 1 %
HCT: 39.8 % (ref 36.0–46.0)
Hemoglobin: 12.1 g/dL (ref 12.0–15.0)
Immature Granulocytes: 0 %
Lymphocytes Relative: 47 %
Lymphs Abs: 5.1 10*3/uL — ABNORMAL HIGH (ref 0.7–4.0)
MCH: 26.1 pg (ref 26.0–34.0)
MCHC: 30.4 g/dL (ref 30.0–36.0)
MCV: 86 fL (ref 80.0–100.0)
Monocytes Absolute: 0.8 10*3/uL (ref 0.1–1.0)
Monocytes Relative: 7 %
Neutro Abs: 4.8 10*3/uL (ref 1.7–7.7)
Neutrophils Relative %: 45 %
Platelet Count: 329 10*3/uL (ref 150–400)
RBC: 4.63 MIL/uL (ref 3.87–5.11)
RDW: 14.7 % (ref 11.5–15.5)
WBC Count: 10.8 10*3/uL — ABNORMAL HIGH (ref 4.0–10.5)
nRBC: 0 % (ref 0.0–0.2)

## 2024-02-17 LAB — CMP (CANCER CENTER ONLY)
ALT: 12 U/L (ref 0–44)
AST: 12 U/L — ABNORMAL LOW (ref 15–41)
Albumin: 3.9 g/dL (ref 3.5–5.0)
Alkaline Phosphatase: 71 U/L (ref 38–126)
Anion gap: 5 (ref 5–15)
BUN: 21 mg/dL (ref 8–23)
CO2: 30 mmol/L (ref 22–32)
Calcium: 9.1 mg/dL (ref 8.9–10.3)
Chloride: 106 mmol/L (ref 98–111)
Creatinine: 0.94 mg/dL (ref 0.44–1.00)
GFR, Estimated: >60 mL/min (ref >60)
Glucose, Bld: 112 mg/dL — ABNORMAL HIGH (ref 70–99)
Potassium: 3.9 mmol/L (ref 3.5–5.1)
Sodium: 141 mmol/L (ref 135–145)
Total Bilirubin: 0.3 mg/dL (ref 0.0–1.2)
Total Protein: 7.3 g/dL (ref 6.5–8.1)

## 2024-02-17 NOTE — Telephone Encounter (Signed)
 Patient was seen earlier today and was unable to recall a disease that her daughter and granddaughter both had.  She called to inform provider that it was Kawasaki Disease.  Routing to provider to make aware and have noted on chart.
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
## 2024-02-19 LAB — BETA-2-GLYCOPROTEIN I ABS, IGG/M/A
Beta-2 Glyco I IgG: <9 GPI IgG units (ref 0–20)
Beta-2-Glycoprotein I IgA: <9 GPI IgA units (ref 0–25)
Beta-2-Glycoprotein I IgM: <9 GPI IgM units (ref 0–32)

## 2024-02-19 LAB — CARDIOLIPIN ANTIBODIES, IGG, IGM, IGA
Anticardiolipin IgA: <9 U/mL (ref 0–11)
Anticardiolipin IgG: <9 GPL U/mL (ref 0–14)
Anticardiolipin IgM: <9 [MPL'U]/mL (ref 0–12)

## 2024-02-21 LAB — PROTHROMBIN GENE MUTATION

## 2024-02-23 ENCOUNTER — Telehealth: Payer: Self-pay | Admitting: Hematology and Oncology

## 2024-02-24 LAB — FACTOR 5 LEIDEN

## 2024-02-27 ENCOUNTER — Other Ambulatory Visit: Payer: Self-pay | Admitting: Student

## 2024-02-27 ENCOUNTER — Telehealth: Payer: Self-pay | Admitting: *Deleted

## 2024-02-27 DIAGNOSIS — I10 Essential (primary) hypertension: Secondary | ICD-10-CM

## 2024-02-27 NOTE — Telephone Encounter (Signed)
 TCT patient regarding recent lab results. Spoke with her. Advised that there is no evidence of a genetic disorder or autoimmune disease causing her blood clots. She voiced understanding. She is aware of her future appts.

## 2024-02-27 NOTE — Telephone Encounter (Signed)
-----   Message from Ulysees Barns IV sent at 02/27/2024  8:24 AM EDT ----- Please let Mrs. Kimbrell know that her CT scan did not show a clear reason for her recurrent blood clots. No evidence of a genetic disorder or autoimmune disease causing her inflation. ----- Message ----- From: Leory Plowman, [facility] [place] Sent: 02/17/2024  10:08 AM EDT To: Jaci Standard, MD

## 2024-02-28 DIAGNOSIS — M810 Age-related osteoporosis without current pathological fracture: Secondary | ICD-10-CM | POA: Diagnosis not present

## 2024-02-28 DIAGNOSIS — R3915 Urgency of urination: Secondary | ICD-10-CM | POA: Diagnosis not present

## 2024-03-01 ENCOUNTER — Ambulatory Visit (INDEPENDENT_AMBULATORY_CARE_PROVIDER_SITE_OTHER): Payer: 59 | Admitting: Student

## 2024-03-01 ENCOUNTER — Encounter: Payer: Self-pay | Admitting: Student

## 2024-03-01 VITALS — BP 137/81 | HR 86 | Ht 66.0 in | Wt 252.0 lb

## 2024-03-01 DIAGNOSIS — I10 Essential (primary) hypertension: Secondary | ICD-10-CM | POA: Diagnosis not present

## 2024-03-01 DIAGNOSIS — N3941 Urge incontinence: Secondary | ICD-10-CM | POA: Insufficient documentation

## 2024-03-01 NOTE — Assessment & Plan Note (Signed)
 Leaks a little right before making it to the toilet.  Discussed doing Kegels throughout day and also and holding Kegel for ~ 10 sec when feeling that urge Referral to pelvic floor PT

## 2024-03-01 NOTE — Progress Notes (Signed)
    SUBJECTIVE:   CHIEF COMPLAINT / HPI:   HTN Currently taking olmesartan - hydrochlorothiazide 20-12.5 mg daily  Home readings ~1-teens/70's  Urge incontinence Worsened since started combo BP pill because she used to take her hydrochlorothiazide separate when she knew she would be at home.  Has a hard time making it to the toilet when she gets home from church - leaks a little when talking to the toilet    PERTINENT  PMH / PSH: HTN  OBJECTIVE:   BP 137/81   Pulse 86   Ht 5\' 6"  (1.676 m)   Wt 252 lb (114.3 kg)   SpO2 98%   BMI 40.67 kg/m    General: NAD, pleasant, able to participate in exam Cardiac: RRR, no murmurs. Respiratory: Normal effort Skin: warm and dry Neuro: alert, no obvious focal deficits Psych: Normal affect and mood  ASSESSMENT/PLAN:   Urge incontinence Leaks a little right before making it to the toilet.  Discussed doing Kegels throughout day and also and holding Kegel for ~ 10 sec when feeling that urge Referral to pelvic floor PT   HYPERTENSION, BENIGN SYSTEMIC Well controlled at home and on repeat Discussed option of switching BP meds from hydrochlorothiazide to amlodipine given her urinary incontinence however patient prefers to stay on current regimen  Continue olmesartan-HCTZ 20-12.5 mg daily   Return in 4 to 6 months for BP check or sooner if needed  Health maintenance: States OBGYN ordered Dexa scan  Dr. Erick Alley, DO [facility]

## 2024-03-01 NOTE — Assessment & Plan Note (Addendum)
 Well controlled at home and on repeat Discussed option of switching BP meds from hydrochlorothiazide to amlodipine given her urinary incontinence however patient prefers to stay on current regimen  Continue olmesartan-HCTZ 20-12.5 mg daily

## 2024-03-01 NOTE — Patient Instructions (Addendum)
 It was great to see you! Thank you for allowing me to participate in your care!    Our plans for today:  - I referred you to pelvic floor therapy. You will be called to schedule apt  - Continue current medications  - return if you desire to switch blood pressure medications  - return in 4-6 months or sooner if needed    Take care and seek immediate care sooner if you develop any concerns.   Dr. Erick Alley, DO [facility]

## 2024-03-07 DIAGNOSIS — M8588 Other specified disorders of bone density and structure, other site: Secondary | ICD-10-CM | POA: Diagnosis not present

## 2024-03-07 LAB — HM DEXA SCAN

## 2024-03-09 ENCOUNTER — Encounter: Payer: Self-pay | Admitting: Student

## 2024-03-12 ENCOUNTER — Telehealth: Payer: Self-pay | Admitting: Student

## 2024-03-12 DIAGNOSIS — M8580 Other specified disorders of bone density and structure, unspecified site: Secondary | ICD-10-CM

## 2024-03-12 MED ORDER — CALCIUM 500 + D3 500-15 MG-MCG PO TABS: 1.0000 | ORAL_TABLET | Freq: Every day | ORAL | 0 refills | Status: DC

## 2024-03-12 NOTE — Telephone Encounter (Signed)
 Called pt to discuss DEXA scan results showing osteopenia  Recommended regular exercise, no smoking, no alcohol use, and starting vitamin D and calcium supplement (rx sent).  She has started drinking 2 to 3 glasses of milk daily.

## 2024-03-20 ENCOUNTER — Other Ambulatory Visit: Payer: Self-pay | Admitting: Student

## 2024-03-20 ENCOUNTER — Encounter: Payer: Self-pay | Admitting: Student

## 2024-03-20 DIAGNOSIS — R112 Nausea with vomiting, unspecified: Secondary | ICD-10-CM

## 2024-03-21 ENCOUNTER — Telehealth: Payer: Self-pay | Admitting: Student

## 2024-03-21 MED ORDER — ALBUTEROL SULFATE (2.5 MG/3ML) 0.083% IN NEBU: 2.5000 mg | INHALATION_SOLUTION | Freq: Four times a day (QID) | RESPIRATORY_TRACT | 1 refills | Status: DC | PRN

## 2024-03-21 NOTE — Telephone Encounter (Signed)
 Called patient discussed refill request for meclizine and Zofran.  She states she uses these for vertigo which is documented in her chart.  States that they help.  Not currently having symptoms but likes to have them on hand when she needs them.  Her current medications are expired and that is why she is requesting refills.  Refills have been sent.

## 2024-03-30 ENCOUNTER — Other Ambulatory Visit: Payer: Self-pay | Admitting: Student

## 2024-03-30 DIAGNOSIS — J45909 Unspecified asthma, uncomplicated: Secondary | ICD-10-CM

## 2024-04-02 NOTE — Telephone Encounter (Signed)
 See other refills encounter.

## 2024-04-08 ENCOUNTER — Other Ambulatory Visit: Payer: Self-pay | Admitting: Student

## 2024-04-08 DIAGNOSIS — I10 Essential (primary) hypertension: Secondary | ICD-10-CM

## 2024-04-19 ENCOUNTER — Encounter (HOSPITAL_COMMUNITY): Payer: Self-pay | Admitting: Emergency Medicine

## 2024-04-19 ENCOUNTER — Emergency Department (HOSPITAL_COMMUNITY)
Admission: EM | Admit: 2024-04-19 | Discharge: 2024-04-20 | Disposition: A | Discharge status: Home / Self Care | Attending: Emergency Medicine | Admitting: Emergency Medicine

## 2024-04-19 ENCOUNTER — Emergency Department (HOSPITAL_COMMUNITY)

## 2024-04-19 ENCOUNTER — Other Ambulatory Visit: Payer: Self-pay

## 2024-04-19 ENCOUNTER — Ambulatory Visit (HOSPITAL_COMMUNITY)
Admission: EM | Admit: 2024-04-19 | Discharge: 2024-04-19 | Disposition: A | Discharge status: Home / Self Care | Payer: Self-pay | Attending: Family Medicine | Admitting: Family Medicine

## 2024-04-19 DIAGNOSIS — M25551 Pain in right hip: Secondary | ICD-10-CM | POA: Insufficient documentation

## 2024-04-19 DIAGNOSIS — Z7901 Long term (current) use of anticoagulants: Secondary | ICD-10-CM | POA: Diagnosis not present

## 2024-04-19 DIAGNOSIS — M542 Cervicalgia: Secondary | ICD-10-CM | POA: Diagnosis not present

## 2024-04-19 DIAGNOSIS — M19011 Primary osteoarthritis, right shoulder: Secondary | ICD-10-CM | POA: Diagnosis not present

## 2024-04-19 DIAGNOSIS — M545 Low back pain, unspecified: Secondary | ICD-10-CM | POA: Diagnosis not present

## 2024-04-19 DIAGNOSIS — Z041 Encounter for examination and observation following transport accident: Secondary | ICD-10-CM | POA: Diagnosis not present

## 2024-04-19 DIAGNOSIS — R519 Headache, unspecified: Secondary | ICD-10-CM | POA: Diagnosis not present

## 2024-04-19 DIAGNOSIS — S46911A Strain of unspecified muscle, fascia and tendon at shoulder and upper arm level, right arm, initial encounter: Secondary | ICD-10-CM | POA: Diagnosis not present

## 2024-04-19 DIAGNOSIS — S4991XA Unspecified injury of right shoulder and upper arm, initial encounter: Secondary | ICD-10-CM | POA: Diagnosis present

## 2024-04-19 DIAGNOSIS — H524 Presbyopia: Secondary | ICD-10-CM | POA: Diagnosis not present

## 2024-04-19 DIAGNOSIS — R079 Chest pain, unspecified: Secondary | ICD-10-CM | POA: Diagnosis not present

## 2024-04-19 DIAGNOSIS — Y92410 Unspecified street and highway as the place of occurrence of the external cause: Secondary | ICD-10-CM | POA: Insufficient documentation

## 2024-04-19 DIAGNOSIS — R1011 Right upper quadrant pain: Secondary | ICD-10-CM | POA: Diagnosis not present

## 2024-04-19 DIAGNOSIS — H401131 Primary open-angle glaucoma, bilateral, mild stage: Secondary | ICD-10-CM | POA: Diagnosis not present

## 2024-04-19 DIAGNOSIS — R109 Unspecified abdominal pain: Secondary | ICD-10-CM | POA: Insufficient documentation

## 2024-04-19 DIAGNOSIS — M25511 Pain in right shoulder: Secondary | ICD-10-CM | POA: Diagnosis not present

## 2024-04-19 DIAGNOSIS — K5730 Diverticulosis of large intestine without perforation or abscess without bleeding: Secondary | ICD-10-CM | POA: Diagnosis not present

## 2024-04-19 DIAGNOSIS — Z981 Arthrodesis status: Secondary | ICD-10-CM | POA: Diagnosis not present

## 2024-04-19 DIAGNOSIS — S199XXA Unspecified injury of neck, initial encounter: Secondary | ICD-10-CM | POA: Diagnosis not present

## 2024-04-19 DIAGNOSIS — S0990XA Unspecified injury of head, initial encounter: Secondary | ICD-10-CM | POA: Diagnosis not present
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
## 2024-04-19 NOTE — Discharge Instructions (Signed)
Patient will go to the emergency room for further evaluation
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
## 2024-04-20 ENCOUNTER — Emergency Department (HOSPITAL_COMMUNITY)

## 2024-04-20 DIAGNOSIS — M545 Low back pain, unspecified: Secondary | ICD-10-CM | POA: Diagnosis not present

## 2024-04-20 DIAGNOSIS — S46911A Strain of unspecified muscle, fascia and tendon at shoulder and upper arm level, right arm, initial encounter: Secondary | ICD-10-CM | POA: Diagnosis not present

## 2024-04-20 DIAGNOSIS — R109 Unspecified abdominal pain: Secondary | ICD-10-CM | POA: Diagnosis not present

## 2024-04-20 DIAGNOSIS — K5730 Diverticulosis of large intestine without perforation or abscess without bleeding: Secondary | ICD-10-CM | POA: Diagnosis not present

## 2024-04-20 DIAGNOSIS — R079 Chest pain, unspecified: Secondary | ICD-10-CM | POA: Diagnosis not present

## 2024-04-20 DIAGNOSIS — S46011A Strain of muscle(s) and tendon(s) of the rotator cuff of right shoulder, initial encounter: Secondary | ICD-10-CM | POA: Diagnosis not present

## 2024-04-20 LAB — CBC WITH DIFFERENTIAL/PLATELET
Abs Immature Granulocytes: 0.01 10*3/uL (ref 0.00–0.07)
Basophils Absolute: 0 10*3/uL (ref 0.0–0.1)
Basophils Relative: 1 %
Eosinophils Absolute: 0.1 10*3/uL (ref 0.0–0.5)
Eosinophils Relative: 2 %
HCT: 42.1 % (ref 36.0–46.0)
Hemoglobin: 12.6 g/dL (ref 12.0–15.0)
Immature Granulocytes: 0 %
Lymphocytes Relative: 47 %
Lymphs Abs: 3.4 10*3/uL (ref 0.7–4.0)
MCH: 26.2 pg (ref 26.0–34.0)
MCHC: 29.9 g/dL — ABNORMAL LOW (ref 30.0–36.0)
MCV: 87.5 fL (ref 80.0–100.0)
Monocytes Absolute: 0.6 10*3/uL (ref 0.1–1.0)
Monocytes Relative: 9 %
Neutro Abs: 2.9 10*3/uL (ref 1.7–7.7)
Neutrophils Relative %: 41 %
Platelets: 300 10*3/uL (ref 150–400)
RBC: 4.81 MIL/uL (ref 3.87–5.11)
RDW: 14 % (ref 11.5–15.5)
WBC: 7.1 10*3/uL (ref 4.0–10.5)
nRBC: 0 % (ref 0.0–0.2)

## 2024-04-20 LAB — COMPREHENSIVE METABOLIC PANEL WITH GFR
ALT: 13 U/L (ref 0–44)
AST: 18 U/L (ref 15–41)
Albumin: 3.5 g/dL (ref 3.5–5.0)
Alkaline Phosphatase: 61 U/L (ref 38–126)
Anion gap: 11 (ref 5–15)
BUN: 19 mg/dL (ref 8–23)
CO2: 22 mmol/L (ref 22–32)
Calcium: 9.3 mg/dL (ref 8.9–10.3)
Chloride: 109 mmol/L (ref 98–111)
Creatinine, Ser: 1.08 mg/dL — ABNORMAL HIGH (ref 0.44–1.00)
GFR, Estimated: 56 mL/min — ABNORMAL LOW (ref >60)
Glucose, Bld: 123 mg/dL — ABNORMAL HIGH (ref 70–99)
Potassium: 3.8 mmol/L (ref 3.5–5.1)
Sodium: 142 mmol/L (ref 135–145)
Total Bilirubin: 0.5 mg/dL (ref 0.0–1.2)
Total Protein: 7 g/dL (ref 6.5–8.1)

## 2024-04-20 MED ORDER — FENTANYL CITRATE PF 50 MCG/ML IJ SOSY
50.0000 ug | PREFILLED_SYRINGE | Freq: Once | INTRAMUSCULAR | Status: DC
  Filled 2024-04-20: qty 1

## 2024-04-20 MED ORDER — TRAMADOL HCL 50 MG PO TABS
50.0000 mg | ORAL_TABLET | Freq: Once | ORAL | Status: AC
  Administered 2024-04-20: 50 mg via ORAL
  Filled 2024-04-20: qty 1

## 2024-04-20 MED ORDER — IOHEXOL 350 MG/ML SOLN
75.0000 mL | Freq: Once | INTRAVENOUS | Status: AC | PRN
  Administered 2024-04-20: 75 mL via INTRAVENOUS

## 2024-04-20 MED ORDER — TRAMADOL HCL 50 MG PO TABS: 50.0000 mg | ORAL_TABLET | Freq: Four times a day (QID) | ORAL | 0 refills | Status: AC | PRN
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
## 2024-04-20 NOTE — ED Notes (Signed)
 Patient returned from CT

## 2024-04-20 NOTE — Progress Notes (Signed)
 Orthopedic Tech Progress Note Patient Details:  Roberta [name] August 24, 1956 [id number] Applied shoulder sling per order.  Ortho Devices Type of Ortho Device: Sling immobilizer Ortho Device/Splint Location: RUE Ortho Device/Splint Interventions: Ordered, Application, Adjustment   Post Interventions Patient Tolerated: Well Instructions Provided: Adjustment of device, Care of device, Poper ambulation with device  Rayna Calkin 04/20/2024, 5:20 AM

## 2024-04-20 NOTE — ED Notes (Signed)
 Patient transported to CT

## 2024-04-23 ENCOUNTER — Encounter: Payer: Self-pay | Admitting: Student

## 2024-04-27 ENCOUNTER — Emergency Department (HOSPITAL_COMMUNITY)
Admission: EM | Admit: 2024-04-27 | Discharge: 2024-04-28 | Disposition: A | Discharge status: Home / Self Care | Attending: Emergency Medicine | Admitting: Emergency Medicine

## 2024-04-27 ENCOUNTER — Emergency Department (HOSPITAL_COMMUNITY)

## 2024-04-27 ENCOUNTER — Encounter (HOSPITAL_COMMUNITY): Payer: Self-pay

## 2024-04-27 ENCOUNTER — Other Ambulatory Visit: Payer: Self-pay

## 2024-04-27 DIAGNOSIS — J4521 Mild intermittent asthma with (acute) exacerbation: Secondary | ICD-10-CM

## 2024-04-27 DIAGNOSIS — Z79899 Other long term (current) drug therapy: Secondary | ICD-10-CM | POA: Insufficient documentation

## 2024-04-27 DIAGNOSIS — Z7901 Long term (current) use of anticoagulants: Secondary | ICD-10-CM | POA: Diagnosis not present

## 2024-04-27 DIAGNOSIS — I10 Essential (primary) hypertension: Secondary | ICD-10-CM | POA: Diagnosis not present

## 2024-04-27 DIAGNOSIS — Z8542 Personal history of malignant neoplasm of other parts of uterus: Secondary | ICD-10-CM | POA: Diagnosis not present

## 2024-04-27 DIAGNOSIS — R0602 Shortness of breath: Secondary | ICD-10-CM | POA: Diagnosis not present

## 2024-04-27 DIAGNOSIS — J45909 Unspecified asthma, uncomplicated: Secondary | ICD-10-CM | POA: Diagnosis present

## 2024-04-27 LAB — CBC
HCT: 41.2 % (ref 36.0–46.0)
Hemoglobin: 12.1 g/dL (ref 12.0–15.0)
MCH: 26 pg (ref 26.0–34.0)
MCHC: 29.4 g/dL — ABNORMAL LOW (ref 30.0–36.0)
MCV: 88.6 fL (ref 80.0–100.0)
Platelets: 330 10*3/uL (ref 150–400)
RBC: 4.65 MIL/uL (ref 3.87–5.11)
RDW: 13.9 % (ref 11.5–15.5)
WBC: 6.2 10*3/uL (ref 4.0–10.5)
nRBC: 0 % (ref 0.0–0.2)

## 2024-04-27 LAB — BASIC METABOLIC PANEL WITH GFR
Anion gap: 10 (ref 5–15)
BUN: 18 mg/dL (ref 8–23)
CO2: 25 mmol/L (ref 22–32)
Calcium: 9.1 mg/dL (ref 8.9–10.3)
Chloride: 106 mmol/L (ref 98–111)
Creatinine, Ser: 0.98 mg/dL (ref 0.44–1.00)
GFR, Estimated: >60 mL/min (ref >60)
Glucose, Bld: 110 mg/dL — ABNORMAL HIGH (ref 70–99)
Potassium: 3.4 mmol/L — ABNORMAL LOW (ref 3.5–5.1)
Sodium: 141 mmol/L (ref 135–145)

## 2024-04-27 LAB — RESP PANEL BY RT-PCR (RSV, FLU A&B, COVID)  RVPGX2
Influenza A by PCR: NEGATIVE
Influenza B by PCR: NEGATIVE
Resp Syncytial Virus by PCR: NEGATIVE
SARS Coronavirus 2 by RT PCR: NEGATIVE

## 2024-04-27 MED ORDER — IPRATROPIUM-ALBUTEROL 0.5-2.5 (3) MG/3ML IN SOLN
3.0000 mL | Freq: Once | RESPIRATORY_TRACT | Status: AC
Start: 2024-04-27 — End: 2024-04-27
  Administered 2024-04-27: 3 mL via RESPIRATORY_TRACT
  Filled 2024-04-27: qty 3

## 2024-04-27 NOTE — ED Provider Triage Note (Signed)
 Emergency Medicine Provider Triage Evaluation Note  Roberta [name] , a 68 y.o. female  was evaluated in triage.  Pt complains of SOB. Patient has been feeling more short of breath over the past week, since having a virus. Worse with exertion. Did not have improvement with inhaler yesterday.  Review of Systems  Positive: SOB, cough Negative: Fever, CP  Physical Exam  BP 116/87   Pulse 83   Temp 98.6 F (37 C) (Oral)   Resp 18   Ht 5\' 6"  (1.676 m)   Wt 112.9 kg   SpO2 97%   BMI 40.19 kg/m  Gen:   Awake, no distress   Resp:  Normal effort  MSK:   Moves extremities without difficulty  Other:    Medical Decision Making  Medically screening exam initiated at 8:48 PM.  Appropriate orders placed.  Roberta [name] was informed that the remainder of the evaluation will be completed by another provider, this initial triage assessment does not replace that evaluation, and the importance of remaining in the ED until their evaluation is complete.   Sonnie Dusky, PA-C 04/27/24 2055

## 2024-04-27 NOTE — ED Triage Notes (Signed)
 Pt reports problems with asthma and allergies. Starting having SOB/wheezing/coughing and low back pain starting last week of April. Pt states symptoms worse at night and with exertion. Pt reports similar symptoms when dealing with seasonal allergies that aggravates her asthma but that it has never lasted this long. Pt states she's been using her albuterol  inhaler but that it hasn't been working.

## 2024-04-27 NOTE — ED Notes (Signed)
 Pt reports being on eloquis for DVT. Improved swelling/pain in RLE.

## 2024-04-28 DIAGNOSIS — J4521 Mild intermittent asthma with (acute) exacerbation: Secondary | ICD-10-CM | POA: Diagnosis not present

## 2024-04-28 MED ORDER — IPRATROPIUM-ALBUTEROL 0.5-2.5 (3) MG/3ML IN SOLN
3.0000 mL | Freq: Once | RESPIRATORY_TRACT | Status: AC
  Administered 2024-04-28: 3 mL via RESPIRATORY_TRACT
  Filled 2024-04-28: qty 3

## 2024-04-28 MED ORDER — DEXAMETHASONE SODIUM PHOSPHATE 4 MG/ML IJ SOLN
4.0000 mg | Freq: Once | INTRAMUSCULAR | Status: DC
  Filled 2024-04-28: qty 1
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
## 2024-04-30 DIAGNOSIS — M19211 Secondary osteoarthritis, right shoulder: Secondary | ICD-10-CM | POA: Diagnosis not present

## 2024-05-10 ENCOUNTER — Ambulatory Visit (INDEPENDENT_AMBULATORY_CARE_PROVIDER_SITE_OTHER): Payer: 59

## 2024-05-10 VITALS — Ht 66.0 in | Wt 248.0 lb

## 2024-05-10 DIAGNOSIS — Z0000 Encounter for general adult medical examination without abnormal findings: Secondary | ICD-10-CM

## 2024-05-10 NOTE — Progress Notes (Signed)
 Because this visit was a virtual/telehealth visit,  certain criteria was not obtained, such a blood pressure, CBG if applicable, and timed get up and go. Any medications not marked as "taking" were not mentioned during the medication reconciliation part of the visit. Any vitals not documented were not able to be obtained due to this being a telehealth visit or patient was unable to self-report a recent blood pressure reading due to a lack of equipment at home via telehealth. Vitals that have been documented are verbally provided by the patient.   Subjective:   Roberta [name] is a 68 y.o. who presents for a Medicare Wellness preventive visit.  As a reminder, Annual Wellness Visits don't include a physical exam, and some assessments may be limited, especially if this visit is performed virtually. We may recommend an in-person follow-up visit with your provider if needed.  Visit Complete: Virtual I connected with  Roberta [name] on 05/10/24 by a audio enabled telemedicine application and verified that I am speaking with the correct person using two identifiers.  Patient Location: Home  Provider Location: Office/Clinic  I discussed the limitations of evaluation and management by telemedicine. The patient expressed understanding and agreed to proceed.  Vital Signs: Because this visit was a virtual/telehealth visit, some criteria may be missing or patient reported. Any vitals not documented were not able to be obtained and vitals that have been documented are patient reported.  VideoDeclined- This patient declined Librarian, academic. Therefore the visit was completed with audio only.  Persons Participating in Visit: Patient.  AWV Questionnaire: No: Patient Medicare AWV questionnaire was not completed prior to this visit.  Cardiac Risk Factors include: advanced age (>85men, >20 women);dyslipidemia;hypertension;obesity (BMI >30kg/m2);sedentary lifestyle      Objective:     Today's Vitals   05/10/24 0954 05/10/24 0955  Weight: 248 lb (112.5 kg)   Height: 5\' 6"  (1.676 m)   PainSc: 0-No pain 4    Body mass index is 40.03 kg/m.     05/10/2024    9:56 AM 04/27/2024    8:27 PM 04/19/2024    9:19 PM 03/01/2024    1:27 PM 02/17/2024    9:13 AM 01/24/2024    9:00 AM 11/07/2023    3:11 PM  Advanced Directives  Does Patient Have a Medical Advance Directive? Yes Yes No No Yes No No  Type of Estate agent of [place];Living will Living will;Healthcare Power of Attorney  Living will;Healthcare Power of [employer] Power of [place];Living will    Does patient want to make changes to medical advance directive?  No - Patient declined       Copy of Healthcare Power of Attorney in Chart? No - copy requested No - copy requested  No - copy requested No - copy requested    Would patient like information on creating a medical advance directive?    No - Patient declined  No - Patient declined No - Patient declined    Current Medications (verified) Outpatient Encounter Medications as of 05/10/2024  Medication Sig   albuterol  (PROVENTIL ) (2.5 MG/3ML) 0.083% nebulizer solution Take 3 mLs (2.5 mg total) by nebulization every 6 (six) hours as needed for wheezing or shortness of breath.   albuterol  (VENTOLIN  HFA) 108 (90 Base) MCG/ACT inhaler INHALE 2 PUFFS INTO THE LUNGS EVERY 4 HOURS AS NEEDED   apixaban  (ELIQUIS ) 5 MG TABS tablet Take 1 tablet (5 mg total) by mouth 2 (two) times daily.   BIOTIN  PO Take 1 capsule by mouth daily.   Calcium  Carb-Cholecalciferol (CALCIUM  500 + D3) 500-15 MG-MCG TABS Take 1 tablet by mouth daily.   cetirizine  (ZYRTEC ) 10 MG tablet TAKE 1 TABLET(10 MG) BY MOUTH DAILY   cyclobenzaprine  (FLEXERIL ) 10 MG tablet Take 10 mg by mouth daily as needed for muscle spasms.   dorzolamide -timolol  (COSOPT ) 22.3-6.8 MG/ML ophthalmic solution Place 1 drop into both eyes 2 (two) times daily.   esomeprazole  (NEXIUM ) 20 MG  capsule TAKE 1 CAPSULE(20 MG) BY MOUTH DAILY AS NEEDED FOR STOMACH ACID OR REFLUX (Patient taking differently: Take 20 mg by mouth daily.)   meclizine  (ANTIVERT ) 12.5 MG tablet TAKE 1 TABLET(12.5 MG) BY MOUTH THREE TIMES DAILY AS NEEDED   olmesartan -hydrochlorothiazide  (BENICAR  HCT) 20-12.5 MG tablet TAKE 1 TABLET BY MOUTH DAILY   ondansetron  (ZOFRAN ) 4 MG tablet TAKE 1 TABLET BY MOUTH EVERY 8 HOURS AS NEEDED FOR NAUSEA OR VOMITING   rosuvastatin  (CRESTOR ) 10 MG tablet Take 1 tablet (10 mg total) by mouth daily.   traMADol  (ULTRAM ) 50 MG tablet Take 1 tablet (50 mg total) by mouth every 6 (six) hours as needed.   Travoprost , BAK Free, (TRAVATAN ) 0.004 % SOLN ophthalmic solution Place 1 drop into both eyes at bedtime.   [DISCONTINUED] ASPIRIN  LOW DOSE 81 MG tablet TAKE 1 TABLET(81 MG) BY MOUTH DAILY. SWALLOW WHOLE   No facility-administered encounter medications on file as of 05/10/2024.    Allergies (verified) Influenza vaccines, Budesonide, Flonase [fluticasone propionate], Hydrocodone-acetaminophen , Morphine and codeine, Nickel, and Phenobarbital   History: Past Medical History:  Diagnosis Date   Abnormal EKG    nonspecific ST and T-wave changes - Followed by Dr.Berry   ALLERGIC RHINITIS 10/14/2010   Anemia    Asthma    ASTHMA, INTERMITTENT 02/16/2007   BACK PAIN, CHRONIC 03/25/2009   CERVICAL RADICULOPATHY 10/16/2009   DDD (degenerative disc disease), lumbosacral    Depression    DEPRESSIVE DISORDER NOT ELSEWHERE CLASSIFIED 03/25/2009   Endometrial cancer (HCC)    GERD (gastroesophageal reflux disease) 09/09/2011   Glaucoma    sees optho every 3 months, drops each night   Hyperlipidemia    Hypertension    PONV (postoperative nausea and vomiting)    Postmenopausal vaginal bleeding 09/24/2014   Viral URI with cough 03/28/2013   Past Surgical History:  Procedure Laterality Date   ANKLE SURGERY     BIOPSY BREAST     LEFT   BREAST CYST INCISION AND DRAINAGE     under breast    BUNIONECTOMY     bilateral and toe correction   CARPAL TUNNEL RELEASE Bilateral 2009   CATARACT SURGERY     CERVICAL SPINE SURGERY     CHOLECYSTECTOMY  1980   DILATATION & CURRETTAGE/HYSTEROSCOPY WITH RESECTOCOPE N/A 07/28/2015   Procedure: DILATATION & CURETTAGE/HYSTEROSCOPY WITH RESECTOCOPE;  Surgeon: Stevenson Elbe, MD;  Location: [facility];  Service: Gynecology;  Laterality: N/A;   KNEE ARTHROSCOPY     ROBOTIC ASSISTED TOTAL HYSTERECTOMY WITH BILATERAL SALPINGO OOPHERECTOMY Bilateral 09/04/2015   Procedure: ROBOTIC ASSISTED HYSTERECTOMY WITH BILATERAL SALPINGO OOPHORECTOMY SENTINAL NODE MAPPING ;  Surgeon: Alphonso Aschoff, MD;  Location: [facility];  Service: Gynecology;  Laterality: Bilateral;   ROTATOR CUFF REPAIR Right May 2014   Family History  Problem Relation Age of Onset   Lymphoma Mother        related to asbestos   Cancer Mother        lymphoma (asbestos exposure)   Alcoholism Father    Cirrhosis Father  due to alcohol   Asthma Father    Cancer Brother    Cancer Maternal Uncle        lung   Cancer Maternal Uncle        lung   Social History   Socioeconomic History   Marital status: Divorced    Spouse name: Not on file   Number of children: 4   Years of education: 16   Highest education level: Associate degree: occupational, Scientist, product/process development, or vocational program  Occupational History   Occupation: Leisure centre manager: UNEMPLOYED   Occupation: Retired    Comment: Computers- troubleshoots  Tobacco Use   Smoking status: Never    Passive exposure: Never   Smokeless tobacco: Never  Vaping Use   Vaping status: Never Used  Substance and Sexual Activity   Alcohol use: No    Alcohol/week: 0.0 standard drinks of alcohol   Drug use: No   Sexual activity: Not Currently  Other Topics Concern   Not on file  Social History Narrative   Patient lives alone in [place].   Patient is very close with her 4 grown children and her grandchildren.   Patient enjoys puzzles,  building Lego cities, crafts, knitting blankets, and bible studies at home.    Patient doesn't eat pork and eats very little red meat          Social Drivers of Corporate investment banker Strain: Low Risk  (05/10/2024)   Overall Financial Resource Strain (CARDIA)    Difficulty of Paying Living Expenses: Not very hard  Food Insecurity: No Food Insecurity (05/10/2024)   Hunger Vital Sign    Worried About Running Out of Food in the Last Year: Never true    Ran Out of Food in the Last Year: Never true  Transportation Needs: No Transportation Needs (05/10/2024)   PRAPARE - Administrator, Civil Service (Medical): No    Lack of Transportation (Non-Medical): No  Physical Activity: Insufficiently Active (05/10/2024)   Exercise Vital Sign    Days of Exercise per Week: 1 day    Minutes of Exercise per Session: 60 min  Stress: No Stress Concern Present (05/10/2024)   [employer] of Occupational Health - Occupational Stress Questionnaire    Feeling of Stress : Not at all  Social Connections: Unknown (05/10/2024)   Social Connection and Isolation Panel [NHANES]    Frequency of Communication with Friends and Family: Three times a week    Frequency of Social Gatherings with Friends and Family: Once a week    Attends Religious Services: More than 4 times per year    Active Member of [employer] or Organizations: Yes    Attends Engineer, structural: More than 4 times per year    Marital Status: Patient declined    Tobacco Counseling Counseling given: Not Answered    Clinical Intake:  Pre-visit preparation completed: Yes  Pain : No/denies pain Pain Score: 4  Pain Type: Neuropathic pain Pain Location: Arm Pain Orientation: Right     BMI - recorded: 40.03 Nutritional Status: BMI > 30  Obese Nutritional Risks: None Diabetes: No  Lab Results  Component Value Date   HGBA1C 6.4 05/02/2023   HGBA1C 6.0 11/02/2022   HGBA1C 6.2 02/24/2022     How often do you need  to have someone help you when you read instructions, pamphlets, or other written materials from your doctor or pharmacy?: 1 - Never  Interpreter Needed?: No  Information entered by ::  Nasser Ku N. Caelen Reierson, LPN.   Activities of Daily Living     05/10/2024   10:01 AM 05/06/2024    2:39 PM  In your present state of health, do you have any difficulty performing the following activities:  Hearing? 0 0  Vision? 0 0  Difficulty concentrating or making decisions? 0 0  Walking or climbing stairs? 0 0  Dressing or bathing? 0 0  Doing errands, shopping? 0 0  Preparing Food and eating ? N N  Using the Toilet? N N  In the past six months, have you accidently leaked urine? Y Y  Do you have problems with loss of bowel control? N N  Managing your Medications? N N  Managing your Finances? N N  Housekeeping or managing your Housekeeping? N N    Patient Care Team: Glenn Lange, DO as PCP - General (Family Medicine) Albert Huff, MD (Ophthalmology) Denette Finner, PA-C as Physician Assistant (Otolaryngology) Baldo Bonds, MD as Consulting Physician (Gastroenterology) Nelva Bang, OD as Referring Physician (Optometry)  Indicate any recent Medical Services you may have received from other than [facility] providers in the past year (date may be approximate).     Assessment:    This is a routine wellness examination for Roberta [name].  Hearing/Vision screen Hearing Screening - Comments:: Patient has hearing difficulties. Patient wears hearing aids. Vision Screening - Comments:: Patient reading glasses - up to date with routine eye exams with Jon B. Scott, OD.    Goals Addressed               This Visit's Progress     Patient Stated (pt-stated)        05/10/2024: My goal is to join a walking group.       Depression Screen     05/10/2024    9:58 AM 03/01/2024    1:26 PM 02/17/2024    9:16 AM 01/24/2024    9:00 AM 01/09/2024    2:02 PM 12/23/2023    8:32 AM 12/09/2023    1:34 PM  PHQ 2/9  Scores  PHQ - 2 Score 0 0 0 0 0 0 0  PHQ- 9 Score 1 0  0 0 0 0    Fall Risk     05/10/2024    9:58 AM 05/06/2024    2:39 PM 03/01/2024    1:26 PM 01/24/2024    9:01 AM 01/09/2024    1:57 PM  Fall Risk   Falls in the past year? 1 1 0 0 0  Number falls in past yr: 0 0 0 0 0  Injury with Fall? 0 0 0 0 0  Risk for fall due to :   No Fall Risks    Follow up Falls evaluation completed;Falls prevention discussed  Falls evaluation completed      MEDICARE RISK AT HOME:  Medicare Risk at Home Any stairs in or around the home?: Yes If so, are there any without handrails?: No Home free of loose throw rugs in walkways, pet beds, electrical cords, etc?: Yes Adequate lighting in your home to reduce risk of falls?: Yes Life alert?: No Use of a cane, walker or w/c?: Yes Grab bars in the bathroom?: No Shower chair or bench in shower?: No Elevated toilet seat or a handicapped toilet?: No  TIMED UP AND GO:  Was the test performed?  No  Cognitive Function: 6CIT completed    05/10/2024    9:58 AM 04/24/2014    4:00 PM  MMSE - Mini Mental  State Exam  Not completed: Unable to complete   Orientation to time  5  Orientation to Place  5  Registration  3  Attention/ Calculation  4  Recall  3  Language- name 2 objects  2  Language- repeat  1  Language- follow 3 step command  3  Language- read & follow direction  1  Write a sentence  1  Copy design  1  Total score  29        05/10/2024   10:02 AM 04/28/2023    9:55 AM 03/04/2022    2:40 PM 10/23/2020    2:06 PM  6CIT Screen  What Year? 0 points 0 points 0 points 0 points  What month? 0 points 0 points 0 points 0 points  What time? 0 points 0 points 0 points 0 points  Count back from 20 0 points 0 points 0 points 0 points  Months in reverse 0 points 0 points 0 points 0 points  Repeat phrase 0 points 0 points 0 points 0 points  Total Score 0 points 0 points 0 points 0 points    Immunizations Immunization History  Administered Date(s)  Administered   Influenza Whole 10/30/2007, 10/02/2008, 10/05/2010   MMR 04/19/1957, 04/22/1957, 04/29/1957   PFIZER Comirnaty(Gray Top)Covid-19 Tri-Sucrose Vaccine 05/19/2021   PFIZER(Purple Top)SARS-COV-2 Vaccination 11/24/2020, 12/15/2020   Td 12/20/2000   Tdap 09/19/2017, 04/09/2019   Varicella 04/19/1957, 04/22/1957, 04/29/1957    Screening Tests Health Maintenance  Topic Date Due   COVID-19 Vaccine (4 - 2024-25 season) 08/21/2023   Zoster Vaccines- Shingrix (1 of 2) 07/20/2024 (Originally 12/17/1975)   Pneumonia Vaccine 38+ Years old (1 of 2 - PCV) 07/21/2024 (Originally 12/17/1975)   Colonoscopy  01/20/2025   MAMMOGRAM  02/08/2025   Medicare Annual Wellness (AWV)  05/10/2025   DTaP/Tdap/Td (4 - Td or Tdap) 04/08/2029   DEXA SCAN  Completed   Hepatitis C Screening  Completed   HPV VACCINES  Aged Out   Meningococcal B Vaccine  Aged Out    Health Maintenance  Health Maintenance Due  Topic Date Due   COVID-19 Vaccine (4 - 2024-25 season) 08/21/2023   Health Maintenance Items Addressed: Yes Patient aware of current care gaps.  Immunization record was verified by [employer].   Additional Screening:  Vision Screening: Recommended annual ophthalmology exams for early detection of glaucoma and other disorders of the eye.  Dental Screening: Recommended annual dental exams for proper oral hygiene  Community Resource Referral / Chronic Care Management: CRR required this visit?  No   CCM required this visit?  No   Plan:    I have personally reviewed and noted the following in the patient's chart:   Medical and social history Use of alcohol, tobacco or illicit drugs  Current medications and supplements including opioid prescriptions. Patient is not currently taking opioid prescriptions. Functional ability and status Nutritional status Physical activity Advanced directives List of other physicians Hospitalizations, surgeries, and ER visits in previous 12  months Vitals Screenings to include cognitive, depression, and falls Referrals and appointments  In addition, I have reviewed and discussed with patient certain preventive protocols, quality metrics, and best practice recommendations. A written personalized care plan for preventive services as well as general preventive health recommendations were provided to patient.   Margette Sheldon, LPN   1/61/0960   After Visit Summary: (MyChart) Due to this being a telephonic visit, the after visit summary with patients personalized plan was offered to patient via MyChart  Notes: Patient aware of current care gaps.  Immunization record was verified by [employer].

## 2024-05-10 NOTE — Patient Instructions (Signed)
 Roberta [name] , Thank you for taking time out of your busy schedule to complete your Annual Wellness Visit with me. I enjoyed our conversation and look forward to speaking with you again next year. I, as well as your care team,  appreciate your ongoing commitment to your health goals. Please review the following plan we discussed and let me know if I can assist you in the future. Your Game plan/ To Do List    Referrals: If you haven't heard from the office you've been referred to, please reach out to them at the phone provided.   Follow up Visits: Next Medicare AWV with our clinical staff: PHONE VISIT  with Nurse Health Advisor on 05/16/2025 at 9:50 a.m. Have you seen your provider in the last 6 months (3 months if uncontrolled diabetes)? Yes Next Office Visit with your provider: Office Visit with Dr. Rochelle Chu on 05/21/2024 at 9:30 am.  Clinician Recommendations:  Aim for 30 minutes of exercise or brisk walking, 6-8 glasses of water , and 5 servings of fruits and vegetables each day.       This is a list of the screening recommended for you and due dates:  Health Maintenance  Topic Date Due   COVID-19 Vaccine (4 - 2024-25 season) 08/21/2023   Zoster (Shingles) Vaccine (1 of 2) 07/20/2024*   Pneumonia Vaccine (1 of 2 - PCV) 07/21/2024*   Colon Cancer Screening  01/20/2025   Mammogram  02/08/2025   Medicare Annual Wellness Visit  05/10/2025   DTaP/Tdap/Td vaccine (4 - Td or Tdap) 04/08/2029   DEXA scan (bone density measurement)  Completed   Hepatitis C Screening  Completed   HPV Vaccine  Aged Out   Meningitis B Vaccine  Aged Out  *Topic was postponed. The date shown is not the original due date.    Advanced directives: (Copy Requested) Please bring a copy of your health care power of attorney and living will to the office to be added to your chart at your convenience. You can mail to [facility] [address] [place], [place] [address] or email to  [email or web address] Advance Care Planning is important because it:  [x]  Makes sure you receive the medical care that is consistent with your values, goals, and preferences  [x]  It provides guidance to your family and loved ones and reduces their decisional burden about whether or not they are making the right decisions based on your wishes.  Follow the link provided in your after visit summary or read over the paperwork we have mailed to you to help you started getting your Advance Directives in place. If you need assistance in completing these, please reach out to us  so that we can help you!  See attachments for Preventive Care and Fall Prevention Tips.

## 2024-05-18 ENCOUNTER — Inpatient Hospital Stay: Admitting: Hematology and Oncology

## 2024-05-18 ENCOUNTER — Inpatient Hospital Stay: Attending: Hematology and Oncology

## 2024-05-18 ENCOUNTER — Other Ambulatory Visit: Payer: Self-pay | Admitting: Hematology and Oncology

## 2024-05-18 VITALS — BP 143/86 | HR 67 | Temp 97.6°F | Resp 13 | Wt 256.7 lb

## 2024-05-18 DIAGNOSIS — I82451 Acute embolism and thrombosis of right peroneal vein: Secondary | ICD-10-CM

## 2024-05-18 DIAGNOSIS — K219 Gastro-esophageal reflux disease without esophagitis: Secondary | ICD-10-CM | POA: Insufficient documentation

## 2024-05-18 DIAGNOSIS — J45909 Unspecified asthma, uncomplicated: Secondary | ICD-10-CM | POA: Insufficient documentation

## 2024-05-18 DIAGNOSIS — Z801 Family history of malignant neoplasm of trachea, bronchus and lung: Secondary | ICD-10-CM | POA: Diagnosis not present

## 2024-05-18 DIAGNOSIS — Z8542 Personal history of malignant neoplasm of other parts of uterus: Secondary | ICD-10-CM | POA: Insufficient documentation

## 2024-05-18 DIAGNOSIS — Z86718 Personal history of other venous thrombosis and embolism: Secondary | ICD-10-CM | POA: Insufficient documentation

## 2024-05-18 DIAGNOSIS — I10 Essential (primary) hypertension: Secondary | ICD-10-CM | POA: Insufficient documentation

## 2024-05-18 DIAGNOSIS — E785 Hyperlipidemia, unspecified: Secondary | ICD-10-CM | POA: Diagnosis not present

## 2024-05-18 DIAGNOSIS — Z7982 Long term (current) use of aspirin: Secondary | ICD-10-CM | POA: Insufficient documentation

## 2024-05-18 DIAGNOSIS — Z79899 Other long term (current) drug therapy: Secondary | ICD-10-CM | POA: Diagnosis not present

## 2024-05-18 DIAGNOSIS — Z7901 Long term (current) use of anticoagulants: Secondary | ICD-10-CM | POA: Insufficient documentation

## 2024-05-18 DIAGNOSIS — M51379 Other intervertebral disc degeneration, lumbosacral region without mention of lumbar back pain or lower extremity pain: Secondary | ICD-10-CM | POA: Insufficient documentation

## 2024-05-18 LAB — CBC WITH DIFFERENTIAL (CANCER CENTER ONLY)
Abs Immature Granulocytes: 0.01 10*3/uL (ref 0.00–0.07)
Basophils Absolute: 0 10*3/uL (ref 0.0–0.1)
Basophils Relative: 0 %
Eosinophils Absolute: 0.2 10*3/uL (ref 0.0–0.5)
Eosinophils Relative: 2 %
HCT: 39.3 % (ref 36.0–46.0)
Hemoglobin: 12.1 g/dL (ref 12.0–15.0)
Immature Granulocytes: 0 %
Lymphocytes Relative: 54 %
Lymphs Abs: 4.1 10*3/uL — ABNORMAL HIGH (ref 0.7–4.0)
MCH: 25.7 pg — ABNORMAL LOW (ref 26.0–34.0)
MCHC: 30.8 g/dL (ref 30.0–36.0)
MCV: 83.4 fL (ref 80.0–100.0)
Monocytes Absolute: 0.7 10*3/uL (ref 0.1–1.0)
Monocytes Relative: 9 %
Neutro Abs: 2.8 10*3/uL (ref 1.7–7.7)
Neutrophils Relative %: 35 %
Platelet Count: 283 10*3/uL (ref 150–400)
RBC: 4.71 MIL/uL (ref 3.87–5.11)
RDW: 14.1 % (ref 11.5–15.5)
WBC Count: 7.8 10*3/uL (ref 4.0–10.5)
nRBC: 0 % (ref 0.0–0.2)

## 2024-05-18 LAB — CMP (CANCER CENTER ONLY)
ALT: 12 U/L (ref 0–44)
AST: 16 U/L (ref 15–41)
Albumin: 4 g/dL (ref 3.5–5.0)
Alkaline Phosphatase: 76 U/L (ref 38–126)
Anion gap: 4 — ABNORMAL LOW (ref 5–15)
BUN: 19 mg/dL (ref 8–23)
CO2: 29 mmol/L (ref 22–32)
Calcium: 9.2 mg/dL (ref 8.9–10.3)
Chloride: 106 mmol/L (ref 98–111)
Creatinine: 0.95 mg/dL (ref 0.44–1.00)
GFR, Estimated: >60 mL/min (ref >60)
Glucose, Bld: 105 mg/dL — ABNORMAL HIGH (ref 70–99)
Potassium: 4.4 mmol/L (ref 3.5–5.1)
Sodium: 139 mmol/L (ref 135–145)
Total Bilirubin: 0.3 mg/dL (ref 0.0–1.2)
Total Protein: 7.5 g/dL (ref 6.5–8.1)

## 2024-05-18 NOTE — Progress Notes (Signed)
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 DVTs who presents for a follow up visit.   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  The patient had a previous provoked DVT in her left lower extremity and this was a provoked in her right.  Interestingly she was on aspirin  therapy at the time she developed her most recent blood clot.  Given her proclivity for clotting, I would recommend indefinite anticoagulation, though technically she could return to not having any DVT prophylaxis.     A provoked venous thromboembolism (VTE) is one that has a clear inciting factor or event. Provoking factors include prolonged travel/immobility, surgery (particular abdominal or orthropedic), trauma,  and pregnancy/ estrogen containing birth control. This patient was reported to have prolonged immobility due to sickness., which would qualify as a transient provoking factor. As such we would recommend 3-6 months of anticoagulation therapy with consideration of additional therapy if symptoms persist. The anticoagulation therapy of choice in this situation is Eliquis  5 mg twice daily. The patient  currently has a supply of this medication and is able to afford it without difficult. We will  plan to see the patient back in 3 months time to reassess and assure they are doing well on treatment.   # Recurrent provoked DVTs --findings at this time are consistent with a provoked VTE --will order baseline CMP and CBC to assure labs are adequate for DOAC therapy --recommend the patient continue eliquis  5mg  BID  --Patient was on aspirin  81 mg p.o. daily at the time she developed her last blood clot. --Given her recurrent provoked DVTs and the fact she was on aspirin  during her last clot, I think it would be best for her to continue on indefinite anticoagulation, however based on guidelines she would be free to discontinue anticoagulation if she chose. --patient denies any bleeding, bruising, or dark stools on this medication. It is well tolerated. No difficulties accessing/affording the medication --continue eliquis  5 mg BID  --labs today show white blood cell 7.8, hemoglobin 12.1, MCV 83.4, platelets 283 --RTC in 3 months, at which time we can transition to maintenance Eliquis  2.5 mg twice daily.  She voiced a desire to switch this plan.  After that we will see her every 6 months.  No orders of the defined types were placed in this encounter.   All questions were answered. The patient knows to call the clinic with any problems, questions or concerns.  A total of more than 60 minutes were spent on this encounter with face-to-face time and non-face-to-face time, including preparing to see the patient, ordering tests and/or medications, counseling the patient and coordination of care as outlined above.   Rogerio Clay, MD Department of Hematology/Oncology [facility] Phone: [phone] Pager: [phone] Email: [email or web address]  05/18/2024 5:56 PM

## 2024-05-21 ENCOUNTER — Ambulatory Visit (INDEPENDENT_AMBULATORY_CARE_PROVIDER_SITE_OTHER): Admitting: Student

## 2024-05-21 ENCOUNTER — Encounter: Payer: Self-pay | Admitting: Student

## 2024-05-21 VITALS — BP 133/69 | HR 65 | Ht 66.0 in | Wt 256.0 lb

## 2024-05-21 DIAGNOSIS — E66813 Obesity, class 3: Secondary | ICD-10-CM | POA: Diagnosis not present

## 2024-05-21 DIAGNOSIS — Z6841 Body Mass Index (BMI) 40.0 and over, adult: Secondary | ICD-10-CM

## 2024-05-21 MED ORDER — SEMAGLUTIDE-WEIGHT MANAGEMENT 0.25 MG/0.5ML ~~LOC~~ SOAJ: 0.2500 mg | SUBCUTANEOUS | 0 refills | Status: DC

## 2024-05-21 NOTE — Patient Instructions (Addendum)
 It was great to see you! Thank you for allowing me to participate in your care!  I recommend that you always bring your medications to each appointment as this makes it easy to ensure you are on the correct medications and helps us  not miss when refills are needed.  Our plans for today:  - [place] [facility] - Goal - a protein with breakfast, three balanced meals/day - Ozempic  sent to pharmacy. If you tolerated the 0.25 mg dose well after the 4th injection, let us  know and we can increase to 0.5 mg injection.  - return 2 months for check in and meet new PCP   Take care and seek immediate care sooner if you develop any concerns.   Dr. Glenn Lange, DO [facility]

## 2024-05-21 NOTE — Progress Notes (Signed)
    SUBJECTIVE:   CHIEF COMPLAINT / HPI:   Weight gain is causing her to feel uncomfortable and she is interested in starting [facility] in addition to lifestyle changes. She participates in weekly family walks but feels unsafe walking alone. She plans to start online exercises through [facility]'s program and aims to visit the [employer] for indoor walking and swimming once she acquires a car.  Her dietary goals include eating three balanced meals/day. She often eats light in the morning, just a piece of fruit, doesn't always eat lunch and is then very hungry at dinner time and over eats. She is trying to incorporate more vegetables and reduce starch intake. She plans to eat more regularly with her grandson who is staying with her during the summer. She has no family or personal history of medullary thyroid  cancer  PERTINENT  PMH / PSH: obesity, HTN, HLD, chronic back pain, pre diabetes  OBJECTIVE:   BP 133/69   Pulse 65   Ht 5\' 6"  (1.676 m)   Wt 256 lb (116.1 kg)   SpO2 100%   BMI 41.32 kg/m    General: NAD, pleasant, able to participate in exam Cardiac: RRR, no murmurs. Respiratory: normal effort Neuro: alert, no obvious focal deficits Psych: Normal affect and mood  ASSESSMENT/PLAN:   Obesity Discussed Ozempic  for weight loss and lifestyle modifications. Explained potential side effects and emphasized starting at a low dose. Encouraged dietary changes and exercise. - Prescribe Ozempic  0.25 mg weekly injection and check insurance coverage. - Encourage daily 30-minute online exercises and plan [employer] visits for indoor activities. - Provide diabetic diet handout - goal to add protein to breakfast with regular healthy meals - Schedule follow-up in two months to check progress and meet new primary care doctor.      Dr. Glenn Lange, DO [facility]

## 2024-05-22 NOTE — Assessment & Plan Note (Signed)
 Discussed Ozempic  for weight loss and lifestyle modifications. Explained potential side effects and emphasized starting at a low dose. Encouraged dietary changes and exercise. - Prescribe Ozempic  0.25 mg weekly injection and check insurance coverage. - Encourage daily 30-minute online exercises and plan [employer] visits for indoor activities. - Provide diabetic diet handout - goal to add protein to breakfast with regular healthy meals - Schedule follow-up in two months to check progress and meet new primary care doctor.

## 2024-05-24 ENCOUNTER — Other Ambulatory Visit (HOSPITAL_COMMUNITY): Payer: Self-pay

## 2024-05-24 ENCOUNTER — Telehealth: Payer: Self-pay

## 2024-05-24 NOTE — Telephone Encounter (Signed)
 Received a prior auth request for patients Wegovy medication. Injectables used for weight loss are non formulary for medicare (Part D) insurances.

## 2024-05-25 ENCOUNTER — Encounter: Payer: Self-pay | Admitting: Student

## 2024-05-29 ENCOUNTER — Encounter: Payer: Self-pay | Admitting: *Deleted

## 2024-06-04 ENCOUNTER — Other Ambulatory Visit (HOSPITAL_COMMUNITY): Payer: Self-pay
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
## 2024-06-12 ENCOUNTER — Ambulatory Visit: Attending: Family Medicine

## 2024-06-12 ENCOUNTER — Other Ambulatory Visit: Payer: Self-pay

## 2024-06-12 DIAGNOSIS — N3941 Urge incontinence: Secondary | ICD-10-CM | POA: Diagnosis not present

## 2024-06-12 DIAGNOSIS — M6281 Muscle weakness (generalized): Secondary | ICD-10-CM | POA: Diagnosis not present

## 2024-06-12 DIAGNOSIS — R279 Unspecified lack of coordination: Secondary | ICD-10-CM | POA: Insufficient documentation

## 2024-06-12 DIAGNOSIS — R293 Abnormal posture: Secondary | ICD-10-CM | POA: Diagnosis not present

## 2024-06-12 NOTE — Patient Instructions (Signed)
Urge Incontinence  Ideal urination frequency is every 2-4 wakeful hours, which equates to 5-8 times within a 24-hour period.   Urge incontinence is leakage that occurs when the bladder muscle contracts, creating a sudden need to go before getting to the bathroom.   Going too often when your bladder isn't actually full can disrupt the body's automatic signals to store and hold urine longer, which will increase urgency/frequency.  In this case, the bladder "is running the show" and strategies can be learned to retrain this pattern.   One should be able to control the first urge to urinate, at around .  The bladder can hold up to a "grande latte," or . To help you gain control, practice the Urge Drill below when urgency strikes.  This drill will help retrain your bladder signals and allow you to store and hold urine longer.  The overall goal is to stretch out your time between voids to reach a more manageable voiding schedule.    Practice your "quick flicks" often throughout the day (each waking hour) even when you don't need feel the urge to go.  This will help strengthen your pelvic floor muscles, making them more effective in controlling leakage.  Urge Drill  When you feel an urge to go, follow these steps to regain control: Stop what you are doing and be still Take one deep breath, directing your air into your abdomen Think an affirming thought, such as "I've got this." Do 5 quick flicks of your pelvic floor Walk with control to the bathroom to void, or delay voiding   Double-voiding: This technique is to help with post-void dribbling, or leaking a little bit when you stand up right after urinating. Use relaxed toileting mechanics to urinate as much as you feel like you have to without straining. Sit back upright from leaning forward and relax this way for 10-20 seconds. Lean forward again to finish voiding any amount more.   [facility] [address], [address] [place], [place] [address] Phone # [phone] Fax [phone]

## 2024-06-29 ENCOUNTER — Other Ambulatory Visit: Payer: Self-pay

## 2024-06-29 ENCOUNTER — Encounter: Payer: Self-pay | Admitting: Family Medicine

## 2024-06-29 DIAGNOSIS — I10 Essential (primary) hypertension: Secondary | ICD-10-CM

## 2024-06-29 MED ORDER — OLMESARTAN MEDOXOMIL-HCTZ 20-12.5 MG PO TABS: 1.0000 | ORAL_TABLET | Freq: Every day | ORAL | 0 refills | Status: DC

## 2024-07-12 ENCOUNTER — Other Ambulatory Visit: Payer: Self-pay

## 2024-07-12 DIAGNOSIS — K219 Gastro-esophageal reflux disease without esophagitis: Secondary | ICD-10-CM

## 2024-07-13 ENCOUNTER — Encounter: Payer: Self-pay | Admitting: Family Medicine

## 2024-07-13 MED ORDER — ESOMEPRAZOLE MAGNESIUM 20 MG PO CPDR: DELAYED_RELEASE_CAPSULE | ORAL | 1 refills | Status: DC

## 2024-07-17 ENCOUNTER — Other Ambulatory Visit: Payer: Self-pay | Admitting: *Deleted

## 2024-07-17 MED ORDER — APIXABAN 5 MG PO TABS: 5.0000 mg | ORAL_TABLET | Freq: Two times a day (BID) | ORAL | 0 refills | Status: DC

## 2024-07-23 DIAGNOSIS — M47816 Spondylosis without myelopathy or radiculopathy, lumbar region: Secondary | ICD-10-CM | POA: Diagnosis not present

## 2024-07-24 ENCOUNTER — Ambulatory Visit: Attending: Family Medicine

## 2024-07-24 DIAGNOSIS — R293 Abnormal posture: Secondary | ICD-10-CM | POA: Insufficient documentation

## 2024-07-24 DIAGNOSIS — M6281 Muscle weakness (generalized): Secondary | ICD-10-CM | POA: Insufficient documentation

## 2024-07-24 DIAGNOSIS — R279 Unspecified lack of coordination: Secondary | ICD-10-CM | POA: Diagnosis not present
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
## 2024-07-31 ENCOUNTER — Ambulatory Visit

## 2024-07-31 DIAGNOSIS — R279 Unspecified lack of coordination: Secondary | ICD-10-CM | POA: Diagnosis not present

## 2024-07-31 DIAGNOSIS — R293 Abnormal posture: Secondary | ICD-10-CM

## 2024-07-31 DIAGNOSIS — M6281 Muscle weakness (generalized): Secondary | ICD-10-CM
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
## 2024-08-07 ENCOUNTER — Ambulatory Visit

## 2024-08-07 DIAGNOSIS — M6281 Muscle weakness (generalized): Secondary | ICD-10-CM

## 2024-08-07 DIAGNOSIS — R279 Unspecified lack of coordination: Secondary | ICD-10-CM | POA: Diagnosis not present

## 2024-08-07 DIAGNOSIS — R293 Abnormal posture: Secondary | ICD-10-CM | POA: Diagnosis not present
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
## 2024-08-14 ENCOUNTER — Ambulatory Visit

## 2024-08-14 DIAGNOSIS — R279 Unspecified lack of coordination: Secondary | ICD-10-CM | POA: Diagnosis not present

## 2024-08-14 DIAGNOSIS — R293 Abnormal posture: Secondary | ICD-10-CM | POA: Diagnosis not present

## 2024-08-14 DIAGNOSIS — M6281 Muscle weakness (generalized): Secondary | ICD-10-CM | POA: Diagnosis not present
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
## 2024-08-17 ENCOUNTER — Inpatient Hospital Stay: Attending: Hematology and Oncology

## 2024-08-17 ENCOUNTER — Inpatient Hospital Stay: Admitting: Hematology and Oncology

## 2024-08-17 ENCOUNTER — Other Ambulatory Visit: Payer: Self-pay | Admitting: Hematology and Oncology

## 2024-08-17 VITALS — BP 146/88 | HR 71 | Temp 97.6°F | Resp 14 | Wt 258.0 lb

## 2024-08-17 DIAGNOSIS — Z801 Family history of malignant neoplasm of trachea, bronchus and lung: Secondary | ICD-10-CM | POA: Diagnosis not present

## 2024-08-17 DIAGNOSIS — Z7901 Long term (current) use of anticoagulants: Secondary | ICD-10-CM | POA: Insufficient documentation

## 2024-08-17 DIAGNOSIS — I10 Essential (primary) hypertension: Secondary | ICD-10-CM | POA: Insufficient documentation

## 2024-08-17 DIAGNOSIS — Z8542 Personal history of malignant neoplasm of other parts of uterus: Secondary | ICD-10-CM | POA: Insufficient documentation

## 2024-08-17 DIAGNOSIS — M51379 Other intervertebral disc degeneration, lumbosacral region without mention of lumbar back pain or lower extremity pain: Secondary | ICD-10-CM | POA: Insufficient documentation

## 2024-08-17 DIAGNOSIS — I82451 Acute embolism and thrombosis of right peroneal vein: Secondary | ICD-10-CM

## 2024-08-17 DIAGNOSIS — Z79899 Other long term (current) drug therapy: Secondary | ICD-10-CM | POA: Diagnosis not present

## 2024-08-17 DIAGNOSIS — Z806 Family history of leukemia: Secondary | ICD-10-CM | POA: Insufficient documentation

## 2024-08-17 DIAGNOSIS — Z86718 Personal history of other venous thrombosis and embolism: Secondary | ICD-10-CM | POA: Insufficient documentation

## 2024-08-17 DIAGNOSIS — D649 Anemia, unspecified: Secondary | ICD-10-CM | POA: Insufficient documentation

## 2024-08-17 DIAGNOSIS — J45909 Unspecified asthma, uncomplicated: Secondary | ICD-10-CM | POA: Insufficient documentation

## 2024-08-17 DIAGNOSIS — E785 Hyperlipidemia, unspecified: Secondary | ICD-10-CM | POA: Diagnosis not present

## 2024-08-17 LAB — CMP (CANCER CENTER ONLY)
ALT: 11 U/L (ref 0–44)
AST: 16 U/L (ref 15–41)
Albumin: 3.9 g/dL (ref 3.5–5.0)
Alkaline Phosphatase: 81 U/L (ref 38–126)
Anion gap: 6 (ref 5–15)
BUN: 15 mg/dL (ref 8–23)
CO2: 26 mmol/L (ref 22–32)
Calcium: 9.1 mg/dL (ref 8.9–10.3)
Chloride: 105 mmol/L (ref 98–111)
Creatinine: 1 mg/dL (ref 0.44–1.00)
GFR, Estimated: >60 mL/min
Glucose, Bld: 116 mg/dL — ABNORMAL HIGH (ref 70–99)
Potassium: 4.3 mmol/L (ref 3.5–5.1)
Sodium: 137 mmol/L (ref 135–145)
Total Bilirubin: 0.5 mg/dL (ref 0.0–1.2)
Total Protein: 7.5 g/dL (ref 6.5–8.1)

## 2024-08-17 LAB — CBC WITH DIFFERENTIAL (CANCER CENTER ONLY)
Abs Immature Granulocytes: 0.01 K/uL (ref 0.00–0.07)
Basophils Absolute: 0 K/uL (ref 0.0–0.1)
Basophils Relative: 0 %
Eosinophils Absolute: 0.1 K/uL (ref 0.0–0.5)
Eosinophils Relative: 2 %
HCT: 41.1 % (ref 36.0–46.0)
Hemoglobin: 12.5 g/dL (ref 12.0–15.0)
Immature Granulocytes: 0 %
Lymphocytes Relative: 49 %
Lymphs Abs: 3.3 K/uL (ref 0.7–4.0)
MCH: 25.6 pg — ABNORMAL LOW (ref 26.0–34.0)
MCHC: 30.4 g/dL (ref 30.0–36.0)
MCV: 84.2 fL (ref 80.0–100.0)
Monocytes Absolute: 0.6 K/uL (ref 0.1–1.0)
Monocytes Relative: 8 %
Neutro Abs: 2.8 K/uL (ref 1.7–7.7)
Neutrophils Relative %: 41 %
Platelet Count: 317 K/uL (ref 150–400)
RBC: 4.88 MIL/uL (ref 3.87–5.11)
RDW: 15 % (ref 11.5–15.5)
WBC Count: 6.9 K/uL (ref 4.0–10.5)
nRBC: 0 % (ref 0.0–0.2)

## 2024-08-17 MED ORDER — APIXABAN 2.5 MG PO TABS: 2.5000 mg | ORAL_TABLET | Freq: Two times a day (BID) | ORAL | 3 refills | Status: AC

## 2024-08-17 NOTE — Progress Notes (Signed)
 [facility] [phone]   [phone]  PROGRESS NOTE  Patient Care Team: [name], MD as PCP - General (Family Medicine) Roz Anes, MD (Ophthalmology) Benay Kay, PA-C as Physician Assistant (Otolaryngology) Dianna Specking, MD as Consulting Physician (Gastroenterology) Glendia Simmonds, OD as Referring Physician (Optometry)  Hematological/Oncological History # Recurrent Lower Extremity DVTs 07/23/2022: Patient underwent ultrasound of her left lower extremity which showed findings consistent with an acute deep vein thrombosis involving the left popliteal vein, left gastrocnemius anemias vein, and popliteal vein.  Blood clot thought to be provoked due to patient wearing compression sleeve for her arm on her leg during prolonged travel 01/31/2024: Patient underwent ultrasound of her right lower extremity which showed acute deep vein thrombosis involving the right peroneal veins.  Patient was started on Eliquis  therapy. 02/17/2024: Establish care with Dr. Federico  Interval History:  Roberta [name] 68 y.o. female with medical history significant for recurrent LE DVTs presents for a follow up visit. The patient's last visit was on 05/18/2024. In the interim since the last visit she has continued on Eliquis  therapy 5 mg twice daily.  On exam today Roberta [name] reports that she has been well since her last visit.  She reports that she is doing her best to try to walk every other week.  She notes that she walks about 1.8 miles per week.  She reports that she has been tolerating her Eliquis  therapy well with no bleeding, bruising, or dark stools.  She reports no bruising or blood in the urine or stool.  She reports the medication currently cost her $0 per month.  Overall she feels quite well and is willing and able to continue on Eliquis  therapy at this time.  Today we had a detailed discussion regarding options moving forward.  We discussed maintenance dose  Eliquis , aspirin , and no anticoagulation.  Due to the provoked nature of her VTE technically she does not require continued anticoagulation, but given her recurrent provoked VTE's I do recommend maintenance dose Eliquis .  She developed her prior clot on aspirin  therapy.  After discussion of the risks and benefits moving forward the patient noted that she would like to proceed with maintenance dose Eliquis .  MEDICAL HISTORY:  Past Medical History:  Diagnosis Date   Abnormal EKG    nonspecific ST and T-wave changes - Followed by Dr.Berry   ALLERGIC RHINITIS 10/14/2010   Anemia    Asthma    ASTHMA, INTERMITTENT 02/16/2007   BACK PAIN, CHRONIC 03/25/2009   CERVICAL RADICULOPATHY 10/16/2009   DDD (degenerative disc disease), lumbosacral    Depression    DEPRESSIVE DISORDER NOT ELSEWHERE CLASSIFIED 03/25/2009   Endometrial cancer (HCC)    GERD (gastroesophageal reflux disease) 09/09/2011   Glaucoma    sees optho every 3 months, drops each night   Hyperlipidemia    Hypertension    PONV (postoperative nausea and vomiting)    Postmenopausal vaginal bleeding 09/24/2014   Viral URI with cough 03/28/2013    SURGICAL HISTORY: Past Surgical History:  Procedure Laterality Date   ANKLE SURGERY     BIOPSY BREAST     LEFT   BREAST CYST INCISION AND DRAINAGE     under breast   BUNIONECTOMY     bilateral and toe correction   CARPAL TUNNEL RELEASE Bilateral 2009   CATARACT SURGERY     CERVICAL SPINE SURGERY     CHOLECYSTECTOMY  1980   DILATATION & CURRETTAGE/HYSTEROSCOPY WITH RESECTOCOPE N/A 07/28/2015   Procedure: DILATATION &  CURETTAGE/HYSTEROSCOPY WITH RESECTOCOPE;  Surgeon: Shanda SHAUNNA Muscat, MD;  Location: [facility];  Service: Gynecology;  Laterality: N/A;   KNEE ARTHROSCOPY     ROBOTIC ASSISTED TOTAL HYSTERECTOMY WITH BILATERAL SALPINGO OOPHERECTOMY Bilateral 09/04/2015   Procedure: ROBOTIC ASSISTED HYSTERECTOMY WITH BILATERAL SALPINGO OOPHORECTOMY SENTINAL NODE MAPPING ;  Surgeon: Maurilio Ship, MD;  Location: [facility];  Service: Gynecology;  Laterality: Bilateral;   ROTATOR CUFF REPAIR Right May 2014    SOCIAL HISTORY: Social History   Socioeconomic History   Marital status: Divorced    Spouse name: Not on file   Number of children: 4   Years of education: 16   Highest education level: Associate degree: occupational, Scientist, product/process development, or vocational program  Occupational History   Occupation: Leisure centre manager: UNEMPLOYED   Occupation: Retired    Comment: Arts administrator- troubleshoots  Tobacco Use   Smoking status: Never    Passive exposure: Never   Smokeless tobacco: Never  Vaping Use   Vaping status: Never Used  Substance and Sexual Activity   Alcohol use: No    Alcohol/week: 0.0 standard drinks of alcohol   Drug use: No   Sexual activity: Not Currently  Other Topics Concern   Not on file  Social History Narrative   Patient lives alone in [place].   Patient is very close with her 4 grown children and her grandchildren.   Patient enjoys puzzles, building Lego cities, crafts, knitting blankets, and bible studies at home.    Patient doesn't eat pork and eats very little red meat          Social Drivers of Corporate investment banker Strain: Low Risk  (05/10/2024)   Overall Financial Resource Strain (CARDIA)    Difficulty of Paying Living Expenses: Not very hard  Food Insecurity: No Food Insecurity (05/10/2024)   Hunger Vital Sign    Worried About Running Out of Food in the Last Year: Never true    Ran Out of Food in the Last Year: Never true  Transportation Needs: No Transportation Needs (05/10/2024)   PRAPARE - Administrator, Civil Service (Medical): No    Lack of Transportation (Non-Medical): No  Physical Activity: Insufficiently Active (05/10/2024)   Exercise Vital Sign    Days of Exercise per Week: 1 day    Minutes of Exercise per Session: 60 min  Stress: No Stress Concern Present (05/10/2024)   [employer] of Occupational Health -  Occupational Stress Questionnaire    Feeling of Stress : Not at all  Social Connections: Unknown (05/10/2024)   Social Connection and Isolation Panel    Frequency of Communication with Friends and Family: Three times a week    Frequency of Social Gatherings with Friends and Family: Once a week    Attends Religious Services: More than 4 times per year    Active Member of [employer] or Organizations: Yes    Attends Banker Meetings: More than 4 times per year    Marital Status: Patient declined  Intimate Partner Violence: Not At Risk (05/10/2024)   Humiliation, Afraid, Rape, and Kick questionnaire    Fear of Current or Ex-Partner: No    Emotionally Abused: No    Physically Abused: No    Sexually Abused: No    FAMILY HISTORY: Family History  Problem Relation Age of Onset   Lymphoma Mother        related to asbestos   Cancer Mother        lymphoma (asbestos  exposure)   Alcoholism Father    Cirrhosis Father        due to alcohol   Asthma Father    Cancer Brother    Cancer Maternal Uncle        lung   Cancer Maternal Uncle        lung    ALLERGIES:  is allergic to influenza vaccines, budesonide, flonase [fluticasone propionate], hydrocodone-acetaminophen , morphine and codeine, nickel, and phenobarbital.  MEDICATIONS:  Current Outpatient Medications  Medication Sig Dispense Refill   apixaban  (ELIQUIS ) 2.5 MG TABS tablet Take 1 tablet (2.5 mg total) by mouth 2 (two) times daily. 180 tablet 3   albuterol  (PROVENTIL ) (2.5 MG/3ML) 0.083% nebulizer solution Take 3 mLs (2.5 mg total) by nebulization every 6 (six) hours as needed for wheezing or shortness of breath. 75 mL 1   albuterol  (VENTOLIN  HFA) 108 (90 Base) MCG/ACT inhaler INHALE 2 PUFFS INTO THE LUNGS EVERY 4 HOURS AS NEEDED 8.5 g 3   BIOTIN PO Take 1 capsule by mouth daily.     Calcium  Carb-Cholecalciferol (CALCIUM  500 + D3) 500-15 MG-MCG TABS Take 1 tablet by mouth daily. 90 tablet 0   cetirizine  (ZYRTEC ) 10 MG tablet  TAKE 1 TABLET(10 MG) BY MOUTH DAILY 90 tablet 3   cyclobenzaprine  (FLEXERIL ) 10 MG tablet Take 10 mg by mouth daily as needed for muscle spasms.     dorzolamide -timolol  (COSOPT ) 22.3-6.8 MG/ML ophthalmic solution Place 1 drop into both eyes 2 (two) times daily. 10 mL 12   esomeprazole  (NEXIUM ) 20 MG capsule TAKE 1 CAPSULE(20 MG) BY MOUTH DAILY AS NEEDED FOR STOMACH ACID OR REFLUX 90 capsule 1   meclizine  (ANTIVERT ) 12.5 MG tablet TAKE 1 TABLET(12.5 MG) BY MOUTH THREE TIMES DAILY AS NEEDED 90 tablet 0   olmesartan -hydrochlorothiazide  (BENICAR  HCT) 20-12.5 MG tablet Take 1 tablet by mouth daily. 90 tablet 0   ondansetron  (ZOFRAN ) 4 MG tablet TAKE 1 TABLET BY MOUTH EVERY 8 HOURS AS NEEDED FOR NAUSEA OR VOMITING 20 tablet 0   rosuvastatin  (CRESTOR ) 10 MG tablet Take 1 tablet (10 mg total) by mouth daily. 90 tablet 3   Semaglutide -Weight Management 0.25 MG/0.5ML SOAJ Inject 0.25 mg into the skin once a week. 2 mL 0   traMADol  (ULTRAM ) 50 MG tablet Take 1 tablet (50 mg total) by mouth every 6 (six) hours as needed. 12 tablet 0   Travoprost , BAK Free, (TRAVATAN ) 0.004 % SOLN ophthalmic solution Place 1 drop into both eyes at bedtime. 2.5 mL 3   No current facility-administered medications for this visit.    REVIEW OF SYSTEMS:   Constitutional: ( - ) fevers, ( - )  chills , ( - ) night sweats Eyes: ( - ) blurriness of vision, ( - ) double vision, ( - ) watery eyes Ears, nose, mouth, throat, and face: ( - ) mucositis, ( - ) sore throat Respiratory: ( - ) cough, ( - ) dyspnea, ( - ) wheezes Cardiovascular: ( - ) palpitation, ( - ) chest discomfort, ( - ) lower extremity swelling Gastrointestinal:  ( - ) nausea, ( - ) heartburn, ( - ) change in bowel habits Skin: ( - ) abnormal skin rashes Lymphatics: ( - ) new lymphadenopathy, ( - ) easy bruising Neurological: ( - ) numbness, ( - ) tingling, ( - ) new weaknesses Behavioral/Psych: ( - ) mood change, ( - ) new changes  All other systems were reviewed  with the patient and are negative.  PHYSICAL EXAMINATION:  Vitals:  08/17/24 1021  BP: (!) 146/88  Pulse: 71  Resp: 14  Temp: 97.6 F (36.4 C)  SpO2: 100%   Filed Weights   08/17/24 1021  Weight: 258 lb (117 kg)    GENERAL: Well-appearing elderly African-American female, alert, no distress and comfortable SKIN: skin color, texture, turgor are normal, no rashes or significant lesions EYES: conjunctiva are pink and non-injected, sclera clear LUNGS: clear to auscultation and percussion with normal breathing effort HEART: regular rate & rhythm and no murmurs and no lower extremity edema Musculoskeletal: no cyanosis of digits and no clubbing  PSYCH: alert & oriented x 3, fluent speech NEURO: no focal motor/sensory deficits  LABORATORY DATA:  I have reviewed the data as listed    Latest Ref Rng & Units 08/17/2024    9:15 AM 05/18/2024    9:28 AM 04/27/2024    8:50 PM  CBC  WBC 4.0 - 10.5 K/uL 6.9  7.8  6.2   Hemoglobin 12.0 - 15.0 g/dL 87.4  87.8  87.8   Hematocrit 36.0 - 46.0 % 41.1  39.3  41.2   Platelets 150 - 400 K/uL 317  283  330        Latest Ref Rng & Units 08/17/2024    9:15 AM 05/18/2024    9:28 AM 04/27/2024    8:50 PM  CMP  Glucose 70 - 99 mg/dL 883  894  889   BUN 8 - 23 mg/dL 15  19  18    Creatinine 0.44 - 1.00 mg/dL 8.99  9.04  9.01   Sodium 135 - 145 mmol/L 137  139  141   Potassium 3.5 - 5.1 mmol/L 4.3  4.4  3.4   Chloride 98 - 111 mmol/L 105  106  106   CO2 22 - 32 mmol/L 26  29  25    Calcium  8.9 - 10.3 mg/dL 9.1  9.2  9.1   Total Protein 6.5 - 8.1 g/dL 7.5  7.5    Total Bilirubin 0.0 - 1.2 mg/dL 0.5  0.3    Alkaline Phos 38 - 126 U/L 81  76    AST 15 - 41 U/L 16  16    ALT 0 - 44 U/L 11  12      No results found for: MPROTEIN No results found for: KPAFRELGTCHN, LAMBDASER, KAPLAMBRATIO  RADIOGRAPHIC STUDIES: No results found.  ASSESSMENT & PLAN Roberta [name] 68 y.o. female with medical history significant for recurrent LE DVTs  presents for a follow up visit.  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  The patient had a previous provoked DVT in her left lower extremity and this was a provoked in her right.  Interestingly she was on aspirin  therapy at the time she developed her most recent blood clot.  Given her proclivity for clotting, I would recommend indefinite anticoagulation, though technically she could return to not having any DVT prophylaxis.     A provoked venous thromboembolism (VTE) is one that has a clear inciting factor or event. Provoking factors include prolonged travel/immobility, surgery (particular abdominal or orthropedic), trauma,  and pregnancy/ estrogen containing birth control. This patient was reported to have prolonged immobility due to sickness., which would qualify as a transient provoking factor. As such we would recommend 3-6 months of anticoagulation therapy with consideration of additional therapy if symptoms persist. The anticoagulation therapy of choice in this situation is  Eliquis  5 mg twice daily. The patient currently has a supply of this medication and is able to afford it without difficult. We will plan to see the patient back in 3 months time to reassess and assure they are doing well on treatment.   # Recurrent provoked DVTs --findings at this time are consistent with a provoked VTE --will order baseline CMP and CBC to assure labs are adequate for DOAC therapy --recommend the patient continue eliquis  5mg  BID until she complete her current supply and then pick up her Eliquis  2.5 mg twice daily for indefinite treatment. --Patient was on aspirin  81 mg p.o. daily at the time she developed her last blood clot. --Given her recurrent provoked DVTs and the fact she was on aspirin  during her last clot, I think it would be best for her to continue on indefinite anticoagulation, however  based on guidelines she would be free to discontinue anticoagulation if she chose. --patient denies any bleeding, bruising, or dark stools on this medication. It is well tolerated. No difficulties accessing/affording the medication --continue eliquis  5 mg BID  --labs today show white blood cell 6.9, hemoglobin 12.5, MCV 84.2, platelets 317.  Creatinine LFTs within normal limits. --RTC in 6 months time to reevaluate.  No orders of the defined types were placed in this encounter.   All questions were answered. The patient knows to call the clinic with any problems, questions or concerns.  A total of more than 30 minutes were spent on this encounter with face-to-face time and non-face-to-face time, including preparing to see the patient, ordering tests and/or medications, counseling the patient and coordination of care as outlined above.   Norleen IVAR Kidney, MD Department of Hematology/Oncology [facility] Phone: [phone] Pager: [phone] Email: [email or web address]  08/17/2024 5:28 PM

## 2024-08-21 ENCOUNTER — Ambulatory Visit: Attending: Family Medicine

## 2024-08-21 DIAGNOSIS — R293 Abnormal posture: Secondary | ICD-10-CM | POA: Diagnosis not present

## 2024-08-21 DIAGNOSIS — M6281 Muscle weakness (generalized): Secondary | ICD-10-CM | POA: Insufficient documentation

## 2024-08-21 DIAGNOSIS — R279 Unspecified lack of coordination: Secondary | ICD-10-CM | POA: Diagnosis not present
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
## 2024-08-22 DIAGNOSIS — H905 Unspecified sensorineural hearing loss: Secondary | ICD-10-CM | POA: Diagnosis not present

## 2024-08-23 ENCOUNTER — Encounter: Payer: Self-pay | Admitting: Family Medicine

## 2024-08-23 ENCOUNTER — Ambulatory Visit (INDEPENDENT_AMBULATORY_CARE_PROVIDER_SITE_OTHER): Admitting: Family Medicine

## 2024-08-23 VITALS — BP 129/78 | HR 73 | Ht 66.0 in | Wt 257.2 lb

## 2024-08-23 DIAGNOSIS — I82451 Acute embolism and thrombosis of right peroneal vein: Secondary | ICD-10-CM

## 2024-08-23 DIAGNOSIS — I10 Essential (primary) hypertension: Secondary | ICD-10-CM

## 2024-08-23 DIAGNOSIS — Z6841 Body Mass Index (BMI) 40.0 and over, adult: Secondary | ICD-10-CM | POA: Diagnosis not present

## 2024-08-23 DIAGNOSIS — E66813 Obesity, class 3: Secondary | ICD-10-CM | POA: Diagnosis not present

## 2024-08-23 NOTE — Progress Notes (Signed)
    SUBJECTIVE:   CHIEF COMPLAINT / HPI: meet new PCP/medications  HTN -Doing well -Used to take hydrochlorothiazide  and Lisinopril  separately -Now on benicar -hct  DVT x2 -Was told by Dr. Federico to be on Eliquis  indefinitely at 2.5mg . -No longer taking aspirin .  Weight -Would like to lose weight -Ozempic  was previously denied -Is trying to eat healthier -Walking every week -Has free membership -Avoids salt -Eats vegetables often  PERTINENT  PMH / PSH: Endometrial Cancer s/p Hysterectomy, GERD, HTN, Asthma  OBJECTIVE:   BP 129/78   Pulse 73   Ht 5' 6 (1.676 m)   Wt 257 lb 3.2 oz (116.7 kg)   SpO2 100%   BMI 41.51 kg/m   General: NAD, well appearing Neuro: A&O Respiratory: normal WOB on RA Extremities: Moving all 4 extremities equally   ASSESSMENT/PLAN:   Assessment & Plan HYPERTENSION, BENIGN SYSTEMIC Blood pressure controlled. Continue Benicar  40-25.  Multiple Provoked DVTs Doing well on 2.5mg  Eliquis  BID. Counseled regarding exercise and potential for falls. Class 3 severe obesity due to excess calories with serious comorbidity and body mass index (BMI) of 40.0 to 44.9 in adult Discussed that unfortunately medicare does not cover GLP-1s for weight loss only. Offered that there are other weight loss medication alternatives. Patient preferred to continue her current plan to increase exercise amount and healthy eating. Offered appointment for nutrition and exercising counseling. Patient will consider.  Return in about 3 months (around 11/22/2024). Lipid and A1c screening.  Roberta Provencal, MD [facility]

## 2024-08-23 NOTE — Assessment & Plan Note (Signed)
 Doing well on 2.5mg  Eliquis  BID. Counseled regarding exercise and potential for falls.

## 2024-08-23 NOTE — Assessment & Plan Note (Signed)
 Discussed that unfortunately medicare does not cover GLP-1s for weight loss only. Offered that there are other weight loss medication alternatives. Patient preferred to continue her current plan to increase exercise amount and healthy eating. Offered appointment for nutrition and exercising counseling. Patient will consider.

## 2024-08-23 NOTE — Assessment & Plan Note (Signed)
 Blood pressure controlled. Continue Benicar  40-25.

## 2024-08-23 NOTE — Patient Instructions (Signed)
 It was great to see you! Thank you for allowing me to participate in your care!  Our plans for today:  - You are doing a great job eating healthy. - I think you have a good plan for exercising. - [employer] and walking often are good options. - Your blood pressure was good today.   Please arrive 15 minutes PRIOR to your next scheduled appointment time! If you do not, this affects OTHER patients' care.  Take care and seek immediate care sooner if you develop any concerns.   Ozell Provencal, MD, PGY-3 [facility] 11:20 AM 08/23/2024  [facility]

## 2024-09-23 ENCOUNTER — Other Ambulatory Visit: Payer: Self-pay | Admitting: Family Medicine

## 2024-09-23 DIAGNOSIS — I10 Essential (primary) hypertension: Secondary | ICD-10-CM

## 2024-09-24 ENCOUNTER — Other Ambulatory Visit (HOSPITAL_COMMUNITY): Payer: Self-pay

## 2024-10-10 ENCOUNTER — Other Ambulatory Visit: Payer: Self-pay

## 2024-10-10 DIAGNOSIS — E785 Hyperlipidemia, unspecified: Secondary | ICD-10-CM

## 2024-10-11 MED ORDER — ROSUVASTATIN CALCIUM 10 MG PO TABS: 10.0000 mg | ORAL_TABLET | Freq: Every day | ORAL | 3 refills | Status: AC

## 2024-10-13 ENCOUNTER — Other Ambulatory Visit: Payer: Self-pay | Admitting: Family Medicine

## 2024-10-13 DIAGNOSIS — K219 Gastro-esophageal reflux disease without esophagitis: Secondary | ICD-10-CM

## 2024-11-16 ENCOUNTER — Other Ambulatory Visit: Payer: Self-pay

## 2024-11-16 ENCOUNTER — Emergency Department (HOSPITAL_COMMUNITY)

## 2024-11-16 ENCOUNTER — Encounter (HOSPITAL_COMMUNITY): Payer: Self-pay | Admitting: Emergency Medicine

## 2024-11-16 ENCOUNTER — Inpatient Hospital Stay (HOSPITAL_COMMUNITY)
Admission: EM | Admit: 2024-11-16 | Discharge: 2024-11-18 | DRG: 202 | Disposition: A | Discharge status: Home / Self Care | Attending: Internal Medicine | Admitting: Internal Medicine

## 2024-11-16 DIAGNOSIS — E66813 Obesity, class 3: Secondary | ICD-10-CM | POA: Diagnosis present

## 2024-11-16 DIAGNOSIS — Z91048 Other nonmedicinal substance allergy status: Secondary | ICD-10-CM

## 2024-11-16 DIAGNOSIS — J9601 Acute respiratory failure with hypoxia: Secondary | ICD-10-CM | POA: Diagnosis not present

## 2024-11-16 DIAGNOSIS — Z801 Family history of malignant neoplasm of trachea, bronchus and lung: Secondary | ICD-10-CM

## 2024-11-16 DIAGNOSIS — Z885 Allergy status to narcotic agent status: Secondary | ICD-10-CM

## 2024-11-16 DIAGNOSIS — Z79899 Other long term (current) drug therapy: Secondary | ICD-10-CM

## 2024-11-16 DIAGNOSIS — H409 Unspecified glaucoma: Secondary | ICD-10-CM | POA: Diagnosis present

## 2024-11-16 DIAGNOSIS — Z807 Family history of other malignant neoplasms of lymphoid, hematopoietic and related tissues: Secondary | ICD-10-CM

## 2024-11-16 DIAGNOSIS — J4521 Mild intermittent asthma with (acute) exacerbation: Principal | ICD-10-CM | POA: Diagnosis present

## 2024-11-16 DIAGNOSIS — Z8542 Personal history of malignant neoplasm of other parts of uterus: Secondary | ICD-10-CM

## 2024-11-16 DIAGNOSIS — K219 Gastro-esophageal reflux disease without esophagitis: Secondary | ICD-10-CM | POA: Diagnosis not present

## 2024-11-16 DIAGNOSIS — J45909 Unspecified asthma, uncomplicated: Secondary | ICD-10-CM

## 2024-11-16 DIAGNOSIS — Z809 Family history of malignant neoplasm, unspecified: Secondary | ICD-10-CM

## 2024-11-16 DIAGNOSIS — Z825 Family history of asthma and other chronic lower respiratory diseases: Secondary | ICD-10-CM

## 2024-11-16 DIAGNOSIS — Z811 Family history of alcohol abuse and dependence: Secondary | ICD-10-CM

## 2024-11-16 DIAGNOSIS — Z8379 Family history of other diseases of the digestive system: Secondary | ICD-10-CM

## 2024-11-16 DIAGNOSIS — I10 Essential (primary) hypertension: Secondary | ICD-10-CM | POA: Diagnosis present

## 2024-11-16 DIAGNOSIS — Z888 Allergy status to other drugs, medicaments and biological substances status: Secondary | ICD-10-CM

## 2024-11-16 DIAGNOSIS — Z86718 Personal history of other venous thrombosis and embolism: Secondary | ICD-10-CM

## 2024-11-16 DIAGNOSIS — Z7901 Long term (current) use of anticoagulants: Secondary | ICD-10-CM

## 2024-11-16 DIAGNOSIS — Z1152 Encounter for screening for COVID-19: Secondary | ICD-10-CM

## 2024-11-16 DIAGNOSIS — Z6841 Body Mass Index (BMI) 40.0 and over, adult: Secondary | ICD-10-CM

## 2024-11-16 DIAGNOSIS — E785 Hyperlipidemia, unspecified: Secondary | ICD-10-CM | POA: Diagnosis present

## 2024-11-16 DIAGNOSIS — E669 Obesity, unspecified: Secondary | ICD-10-CM | POA: Diagnosis present

## 2024-11-16 DIAGNOSIS — Z887 Allergy status to serum and vaccine status: Secondary | ICD-10-CM

## 2024-11-16 DIAGNOSIS — J45901 Unspecified asthma with (acute) exacerbation: Principal | ICD-10-CM | POA: Diagnosis present

## 2024-11-16 DIAGNOSIS — Z91014 Allergy to mammalian meats: Secondary | ICD-10-CM

## 2024-11-16 LAB — CBC WITH DIFFERENTIAL/PLATELET
Abs Immature Granulocytes: 0.01 K/uL (ref 0.00–0.07)
Basophils Absolute: 0 K/uL (ref 0.0–0.1)
Basophils Relative: 0 %
Eosinophils Absolute: 0.3 K/uL (ref 0.0–0.5)
Eosinophils Relative: 6 %
HCT: 40.1 % (ref 36.0–46.0)
Hemoglobin: 12 g/dL (ref 12.0–15.0)
Immature Granulocytes: 0 %
Lymphocytes Relative: 46 %
Lymphs Abs: 2 K/uL (ref 0.7–4.0)
MCH: 25.9 pg — ABNORMAL LOW (ref 26.0–34.0)
MCHC: 29.9 g/dL — ABNORMAL LOW (ref 30.0–36.0)
MCV: 86.6 fL (ref 80.0–100.0)
Monocytes Absolute: 0.3 K/uL (ref 0.1–1.0)
Monocytes Relative: 6 %
Neutro Abs: 1.9 K/uL (ref 1.7–7.7)
Neutrophils Relative %: 42 %
Platelets: 273 K/uL (ref 150–400)
RBC: 4.63 MIL/uL (ref 3.87–5.11)
RDW: 14.7 % (ref 11.5–15.5)
WBC: 4.5 K/uL (ref 4.0–10.5)
nRBC: 0 % (ref 0.0–0.2)

## 2024-11-16 LAB — RESP PANEL BY RT-PCR (RSV, FLU A&B, COVID)  RVPGX2
Influenza A by PCR: NEGATIVE
Influenza B by PCR: NEGATIVE
Resp Syncytial Virus by PCR: NEGATIVE
SARS Coronavirus 2 by RT PCR: NEGATIVE

## 2024-11-16 LAB — BASIC METABOLIC PANEL WITH GFR
Anion gap: 12 (ref 5–15)
BUN: 14 mg/dL (ref 8–23)
CO2: 24 mmol/L (ref 22–32)
Calcium: 9 mg/dL (ref 8.9–10.3)
Chloride: 104 mmol/L (ref 98–111)
Creatinine, Ser: 0.96 mg/dL (ref 0.44–1.00)
GFR, Estimated: >60 mL/min (ref >60)
Glucose, Bld: 165 mg/dL — ABNORMAL HIGH (ref 70–99)
Potassium: 4.5 mmol/L (ref 3.5–5.1)
Sodium: 140 mmol/L (ref 135–145)

## 2024-11-16 MED ORDER — IRBESARTAN 150 MG PO TABS
150.0000 mg | ORAL_TABLET | Freq: Every day | ORAL | Status: DC
  Administered 2024-11-17 – 2024-11-18 (×2): 150 mg via ORAL
  Filled 2024-11-16 (×2): qty 1

## 2024-11-16 MED ORDER — POLYETHYLENE GLYCOL 3350 17 G PO PACK
17.0000 g | PACK | Freq: Every day | ORAL | Status: DC | PRN
  Administered 2024-11-17: 17 g via ORAL
  Filled 2024-11-16: qty 1

## 2024-11-16 MED ORDER — TRAVOPROST (BAK FREE) 0.004 % OP SOLN
1.0000 [drp] | Freq: Every day | OPHTHALMIC | Status: DC
  Administered 2024-11-16 – 2024-11-17 (×2): 1 [drp] via OPHTHALMIC

## 2024-11-16 MED ORDER — IPRATROPIUM-ALBUTEROL 0.5-2.5 (3) MG/3ML IN SOLN
3.0000 mL | RESPIRATORY_TRACT | Status: DC
  Administered 2024-11-16 – 2024-11-17 (×3): 3 mL via RESPIRATORY_TRACT
  Filled 2024-11-16 (×3): qty 3

## 2024-11-16 MED ORDER — TRAZODONE HCL 50 MG PO TABS: 50.0000 mg | ORAL_TABLET | Freq: Every evening | ORAL | Status: DC | PRN

## 2024-11-16 MED ORDER — DORZOLAMIDE HCL-TIMOLOL MAL 2-0.5 % OP SOLN
1.0000 [drp] | Freq: Two times a day (BID) | OPHTHALMIC | Status: DC
  Administered 2024-11-16: 1 [drp] via OPHTHALMIC
  Filled 2024-11-16: qty 10

## 2024-11-16 MED ORDER — ONDANSETRON HCL 4 MG/2ML IJ SOLN: 4.0000 mg | Freq: Four times a day (QID) | INTRAMUSCULAR | Status: DC | PRN

## 2024-11-16 MED ORDER — HYDRALAZINE HCL 20 MG/ML IJ SOLN
10.0000 mg | Freq: Four times a day (QID) | INTRAMUSCULAR | Status: DC | PRN
  Filled 2024-11-16: qty 1

## 2024-11-16 MED ORDER — ALBUTEROL SULFATE (2.5 MG/3ML) 0.083% IN NEBU: 2.5000 mg | INHALATION_SOLUTION | RESPIRATORY_TRACT | Status: DC | PRN

## 2024-11-16 MED ORDER — TRAMADOL HCL 50 MG PO TABS: 50.0000 mg | ORAL_TABLET | Freq: Four times a day (QID) | ORAL | Status: DC | PRN

## 2024-11-16 MED ORDER — ONDANSETRON HCL 4 MG PO TABS: 4.0000 mg | ORAL_TABLET | Freq: Four times a day (QID) | ORAL | Status: DC | PRN

## 2024-11-16 MED ORDER — HYDROCHLOROTHIAZIDE 12.5 MG PO TABS
12.5000 mg | ORAL_TABLET | Freq: Every day | ORAL | Status: DC
  Administered 2024-11-17 – 2024-11-18 (×2): 12.5 mg via ORAL
  Filled 2024-11-16 (×2): qty 1

## 2024-11-16 MED ORDER — APIXABAN 2.5 MG PO TABS
2.5000 mg | ORAL_TABLET | Freq: Two times a day (BID) | ORAL | Status: DC
  Administered 2024-11-16 – 2024-11-18 (×4): 2.5 mg via ORAL
  Filled 2024-11-16 (×4): qty 1

## 2024-11-16 MED ORDER — ROSUVASTATIN CALCIUM 10 MG PO TABS
10.0000 mg | ORAL_TABLET | Freq: Every day | ORAL | Status: DC
  Administered 2024-11-17 – 2024-11-18 (×2): 10 mg via ORAL
  Filled 2024-11-16 (×2): qty 1

## 2024-11-16 MED ORDER — ALBUTEROL SULFATE (2.5 MG/3ML) 0.083% IN NEBU
10.0000 mg/h | INHALATION_SOLUTION | Freq: Once | RESPIRATORY_TRACT | Status: AC
  Administered 2024-11-16: 10 mg/h via RESPIRATORY_TRACT
  Filled 2024-11-16: qty 15

## 2024-11-16 MED ORDER — DORZOLAMIDE HCL-TIMOLOL MAL 2-0.5 % OP SOLN
1.0000 [drp] | Freq: Two times a day (BID) | OPHTHALMIC | Status: DC
  Administered 2024-11-17 – 2024-11-18 (×3): 1 [drp] via OPHTHALMIC

## 2024-11-16 MED ORDER — PANTOPRAZOLE SODIUM 40 MG PO TBEC
40.0000 mg | DELAYED_RELEASE_TABLET | Freq: Every day | ORAL | Status: DC
  Administered 2024-11-17 – 2024-11-18 (×2): 40 mg via ORAL
  Filled 2024-11-16 (×2): qty 1

## 2024-11-16 MED ORDER — OLMESARTAN MEDOXOMIL-HCTZ 20-12.5 MG PO TABS: 1.0000 | ORAL_TABLET | Freq: Every day | ORAL | Status: DC

## 2024-11-16 NOTE — ED Notes (Addendum)
 Collected urine and sent it down to lab.

## 2024-11-16 NOTE — H&P (Signed)
 History and Physical    Patient: Roberta [name] FMW:[id number] DOB: August 24, 1956 DOA: 11/16/2024 DOS: the patient was seen and examined on 11/16/2024 PCP: [name], MD  Patient coming from: Home  Chief Complaint:  Chief Complaint  Patient presents with   Asthma    HPI: Roberta [name] is a 68 y.o. female with history of asthma, hypertension, hyperlipidemia, obesity, GERD, recurrent provoked DVT on low-dose Eliquis , presenting with worsening cough and shortness of breath.  Patient reported that for the last several days she had been having worsening shortness of breath and dry cough.  Cough intermittently productive of whitish sputum.  Attempted to use inhaler with minimal effect or relief.  Finally became dyspneic tachypneic at rest and subsequently called EMS.  Denies any purulent sputum, fever, sick contacts, nausea, vomiting, abdominal pain.  EMS reportedly finding patient hypoxic requiring supplemental O2 as well as nebulizer therapy.  Upon evaluation emergency department, chest x-ray negative for any acute consolidation.  Rapid COVID/flu/RSV negative.  Diffuse bilateral wheezing despite multiple nebulizers.   Review of Systems: As mentioned in the history of present illness. All other systems reviewed and are negative. Past Medical History:  Diagnosis Date   Abnormal EKG    nonspecific ST and T-wave changes - Followed by Dr.Berry   ALLERGIC RHINITIS 10/14/2010   Anemia    Asthma    ASTHMA, INTERMITTENT 02/16/2007   Atypical endometrial hyperplasia 07/28/2015   BACK PAIN, CHRONIC 03/25/2009   CERVICAL RADICULOPATHY 10/16/2009   DDD (degenerative disc disease), lumbosacral    Depression    DEPRESSIVE DISORDER NOT ELSEWHERE CLASSIFIED 03/25/2009   Endometrial cancer (HCC)    GERD (gastroesophageal reflux disease) 09/09/2011   Glaucoma    sees optho every 3 months, drops each night   Hyperlipidemia    Hypertension    Localized swelling, mass or lump of neck  10/26/2018   PONV (postoperative nausea and vomiting)    Postmenopausal vaginal bleeding 09/24/2014   Viral URI with cough 03/28/2013   Past Surgical History:  Procedure Laterality Date   ANKLE SURGERY     BIOPSY BREAST     LEFT   BREAST CYST INCISION AND DRAINAGE     under breast   BUNIONECTOMY     bilateral and toe correction   CARPAL TUNNEL RELEASE Bilateral 2009   CATARACT SURGERY     CERVICAL SPINE SURGERY     CHOLECYSTECTOMY  1980   DILATATION & CURRETTAGE/HYSTEROSCOPY WITH RESECTOCOPE N/A 07/28/2015   Procedure: DILATATION & CURETTAGE/HYSTEROSCOPY WITH RESECTOCOPE;  Surgeon: Shanda SHAUNNA Muscat, MD;  Location: [facility];  Service: Gynecology;  Laterality: N/A;   KNEE ARTHROSCOPY     ROBOTIC ASSISTED TOTAL HYSTERECTOMY WITH BILATERAL SALPINGO OOPHERECTOMY Bilateral 09/04/2015   Procedure: ROBOTIC ASSISTED HYSTERECTOMY WITH BILATERAL SALPINGO OOPHORECTOMY SENTINAL NODE MAPPING ;  Surgeon: Maurilio Ship, MD;  Location: [facility];  Service: Gynecology;  Laterality: Bilateral;   ROTATOR CUFF REPAIR Right May 2014   Social History:  reports that she has never smoked. She has never been exposed to tobacco smoke. She has never used smokeless tobacco. She reports that she does not drink alcohol and does not use drugs.  Allergies  Allergen Reactions   Influenza Vaccines Anaphylaxis    Throat swelling reported after flu shot in the past    Budesonide Other (See Comments)    Throat infection per patient   Flonase [Fluticasone Propionate] Other (See Comments)    Upper respitory issues - lead to time of pneumonia hospitalization   Hydrocodone-Acetaminophen   Nausea And Vomiting    Effective for pain but had GI side effects   Porcine (Pork) Protein-Containing Drug Products    Morphine And Codeine Nausea And Vomiting   Nickel Hives and Rash   Phenobarbital Hypertension and Rash    Family History  Problem Relation Age of Onset   Lymphoma Mother        related to asbestos   Cancer Mother         lymphoma (asbestos exposure)   Alcoholism Father    Cirrhosis Father        due to alcohol   Asthma Father    Cancer Brother    Cancer Maternal Uncle        lung   Cancer Maternal Uncle        lung    Prior to Admission medications   Medication Sig Start Date End Date Taking? Authorizing Provider  albuterol  (PROVENTIL ) (2.5 MG/3ML) 0.083% nebulizer solution Take 3 mLs (2.5 mg total) by nebulization every 6 (six) hours as needed for wheezing or shortness of breath. 03/21/24  Yes [name], DO  albuterol  (VENTOLIN  HFA) 108 (90 Base) MCG/ACT inhaler INHALE 2 PUFFS INTO THE LUNGS EVERY 4 HOURS AS NEEDED 04/03/24  Yes [name], DO  apixaban  (ELIQUIS ) 2.5 MG TABS tablet Take 1 tablet (2.5 mg total) by mouth 2 (two) times daily. Patient taking differently: Take 2.5 mg by mouth 2 (two) times daily. 8am & 8pm 08/17/24  Yes [name] DASEN IV, MD  BIOTIN PO Take 1 capsule by mouth daily.   Yes [provider]  cetirizine  (ZYRTEC ) 10 MG tablet TAKE 1 TABLET(10 MG) BY MOUTH DAILY Patient taking differently: Take 10 mg by mouth daily as needed for allergies. 03/20/24  Yes [name], DO  cyclobenzaprine  (FLEXERIL ) 10 MG tablet Take 10 mg by mouth daily as needed for muscle spasms.   Yes [provider]  dorzolamide -timolol  (COSOPT ) 22.3-6.8 MG/ML ophthalmic solution Place 1 drop into both eyes 2 (two) times daily. Patient taking differently: Place 1 drop into both eyes 2 (two) times daily. 8am & 8pm 10/05/17  Yes Rennie Toribio CROME, MD  esomeprazole  (NEXIUM ) 20 MG capsule TAKE 1 CAPSULE(20 MG) BY MOUTH DAILY AS NEEDED FOR STOMACH ACID OR REFLUX Patient taking differently: Take 20 mg by mouth daily. FOR STOMACH ACID OR REFLUX 10/15/24  Yes [name], MD  meclizine  (ANTIVERT ) 12.5 MG tablet TAKE 1 TABLET(12.5 MG) BY MOUTH THREE TIMES DAILY AS NEEDED 03/21/24  Yes [name], DO  Multiple Vitamins-Minerals (EMERGEN-C IMMUNE PO) Take 1 packet by mouth as needed.   Yes [provider]  olmesartan -hydrochlorothiazide  (BENICAR  HCT) 20-12.5 MG tablet TAKE 1 TABLET BY MOUTH DAILY 09/24/24  Yes [name], MD  ondansetron  (ZOFRAN ) 4 MG tablet TAKE 1 TABLET BY MOUTH EVERY 8 HOURS AS NEEDED FOR NAUSEA OR VOMITING 03/21/24  Yes [name], DO  rosuvastatin  (CRESTOR ) 10 MG tablet Take 1 tablet (10 mg total) by mouth daily. 10/11/24  Yes [name], MD  traMADol  (ULTRAM ) 50 MG tablet Take 1 tablet (50 mg total) by mouth every 6 (six) hours as needed. 04/20/24  Yes [name], MD  Travoprost , BAK Free, (TRAVATAN ) 0.004 % SOLN ophthalmic solution Place 1 drop into both eyes at bedtime. Patient taking differently: Place 1 drop into both eyes at bedtime. 1 drop in both eyes 15 minutes after the Dorzolamide -Timolol  10/23/14  Yes [name], MD    Physical Exam:  Vitals:   11/16/24 1515 11/16/24  1530 11/16/24 1551 11/16/24 1700  BP: 132/75 133/77 (!) 141/92 (!) 124/99  Pulse: 65 69 71 81  Resp:   19 (!) 21  Temp:   (!) 97.3 F (36.3 C)   TempSrc:   Oral   SpO2: 100% 94% 96% 96%    GENERAL:  Alert, pleasant, mild acute distress, obese HEENT:  EOMI CARDIOVASCULAR:  RRR, no murmurs appreciated RESPIRATORY: Dry cough, dyspnea, bilateral wheezing appreciated GASTROINTESTINAL:  Soft, nontender, nondistended EXTREMITIES:  No LE edema bilaterally NEURO:  No new focal deficits appreciated SKIN:  No rashes noted PSYCH:  Appropriate mood and affect, anxious   Data Reviewed:  Chest x-ray personally reviewed noting no consolidations.  DG Chest Port 1 View Result Date: 11/16/2024 CLINICAL DATA:  Cough.  Increasing shortness of breath. EXAM: PORTABLE CHEST 1 VIEW COMPARISON:  04/27/2024 and prior exams.  CT, 04/20/2024. FINDINGS: Cardiac silhouette is normal in size. No mediastinal or hilar masses. Lungs are clear.  No pleural effusion or pneumothorax. Skeletal structures are grossly intact. IMPRESSION: No active disease. Electronically Signed   By: Alm Parkins M.D.   On: 11/16/2024 15:10    Assessment and Plan:  Acute hypoxic respiratory failure secondary to acute asthma exacerbation - Reportedly requiring supplemental oxygen on presentation secondary to dyspnea and tachypnea.  Showing mild improvement in hypoxia but still having diffuse bilateral wheezing despite continuous albuterol  nebs and multiple DuoNebs.  Patient has reported allergy to corticosteroids so we will hold off for now.  Will initiate scheduled DuoNebs every 4 hours, albuterol  as needed.  Mucinex , Tessalon  perles.  Supplemental O2 as needed.  Will hold off on antibiotics given no purulence or increased sputum production.  Will place in progressive unit due to bilateral wheezing and hypoxia.  However if improving, can stepdown appropriately.  History of provoked DVT - Restart patient's Eliquis  2.5 mg twice daily.  GERD - Initiate Protonix 40 mg daily.  Hypertension/hyperlipidemia - Resume patient's home medication regiment.  Obesity - Patient with class III obesity, weight greater than 110 kg.  Encourage lifestyle modifications and dieting.  Being evaluated for GLP-1 in the outpatient setting.  Advance Care Planning:   Code Status: Full Code   Consults: None  Family Communication: Family at bedside  Severity of Illness: The appropriate patient status for this patient is INPATIENT. Inpatient status is judged to be reasonable and necessary in order to provide the required intensity of service to ensure the patient's safety. The patient's presenting symptoms, physical exam findings, and initial radiographic and laboratory data in the context of their chronic comorbidities is felt to place them at high risk for further clinical deterioration. Furthermore, it is not anticipated that the patient will be medically stable for discharge from the hospital within 2 midnights of admission.   * I certify that at the point of admission it is my clinical judgment that the patient  will require inpatient hospital care spanning beyond 2 midnights from the point of admission due to high intensity of service, high risk for further deterioration and high frequency of surveillance required.*  Author: Carliss LELON Canales, DO 11/16/2024 6:56 PM  For on call review [email or web address].

## 2024-11-16 NOTE — ED Provider Notes (Signed)
 [place] EMERGENCY DEPARTMENT AT [facility] Provider Note   CSN: [id number] Arrival date & time: 11/16/24  1310     Patient presents with: Asthma   Roberta [name] is a 68 y.o. female.   Patient complains of an exacerbation of asthma.  She states that she has been having asthma symptoms for several days.  Patient has not been using her nebulizer at home.  Patient reports that her tubing is old.  She has been using her inhaler without relief.  Patient reports increasing shortness of breath today she called EMS.  EMS gave her a DuoNeb Solu-Medrol  and magnesium .  She reports feeling better she has just finished the second neb treatment and EMS started.  Patient reports she has past medical history of asthma.  Patient denies any other complaints she has not had any nausea or vomiting she denies any fever or chills.  She denies any known exposure to flu or COVID   Asthma Associated symptoms include shortness of breath.       Prior to Admission medications   Medication Sig Start Date End Date Taking? Authorizing Provider  albuterol  (PROVENTIL ) (2.5 MG/3ML) 0.083% nebulizer solution Take 3 mLs (2.5 mg total) by nebulization every 6 (six) hours as needed for wheezing or shortness of breath. 03/21/24   [name], DO  albuterol  (VENTOLIN  HFA) 108 (90 Base) MCG/ACT inhaler INHALE 2 PUFFS INTO THE LUNGS EVERY 4 HOURS AS NEEDED 04/03/24   [name], DO  apixaban  (ELIQUIS ) 2.5 MG TABS tablet Take 1 tablet (2.5 mg total) by mouth 2 (two) times daily. 08/17/24   [name] ONEIDA MADISON, MD  BIOTIN PO Take 1 capsule by mouth daily.    [provider]  cetirizine  (ZYRTEC ) 10 MG tablet TAKE 1 TABLET(10 MG) BY MOUTH DAILY 03/20/24   [name], DO  cyclobenzaprine  (FLEXERIL ) 10 MG tablet Take 10 mg by mouth daily as needed for muscle spasms.    [provider]  dorzolamide -timolol  (COSOPT ) 22.3-6.8 MG/ML ophthalmic solution Place 1 drop into both eyes 2 (two) times daily.  10/05/17   Rennie Toribio CROME, MD  esomeprazole  (NEXIUM ) 20 MG capsule TAKE 1 CAPSULE(20 MG) BY MOUTH DAILY AS NEEDED FOR STOMACH ACID OR REFLUX 10/15/24   [name], MD  meclizine  (ANTIVERT ) 12.5 MG tablet TAKE 1 TABLET(12.5 MG) BY MOUTH THREE TIMES DAILY AS NEEDED 03/21/24   [name], DO  olmesartan -hydrochlorothiazide  (BENICAR  HCT) 20-12.5 MG tablet TAKE 1 TABLET BY MOUTH DAILY 09/24/24   [name], MD  ondansetron  (ZOFRAN ) 4 MG tablet TAKE 1 TABLET BY MOUTH EVERY 8 HOURS AS NEEDED FOR NAUSEA OR VOMITING 03/21/24   [name], DO  rosuvastatin  (CRESTOR ) 10 MG tablet Take 1 tablet (10 mg total) by mouth daily. 10/11/24   [name], MD  traMADol  (ULTRAM ) 50 MG tablet Take 1 tablet (50 mg total) by mouth every 6 (six) hours as needed. 04/20/24   [name], MD  Travoprost , BAK Free, (TRAVATAN ) 0.004 % SOLN ophthalmic solution Place 1 drop into both eyes at bedtime. 10/23/14   Issac Andrea JAYSON, MD    Allergies: Influenza vaccines, Budesonide, Flonase [fluticasone propionate], Hydrocodone-acetaminophen , Morphine and codeine, Nickel, and Phenobarbital    Review of Systems  Respiratory:  Positive for shortness of breath and wheezing.   All other systems reviewed and are negative.   Updated Vital Signs BP (!) 149/112 (BP Location: Left Arm)   Pulse 62   Temp (!) 97.5 F (36.4 C) (Oral)   Resp ROLLEN)  22   SpO2 100%   Physical Exam Vitals and nursing note reviewed.  Constitutional:      Appearance: She is well-developed.  HENT:     Head: Normocephalic.  Cardiovascular:     Rate and Rhythm: Normal rate.  Pulmonary:     Effort: Pulmonary effort is normal.     Breath sounds: Wheezing and rhonchi present.  Abdominal:     General: There is no distension.  Musculoskeletal:        General: Normal range of motion.     Cervical back: Normal range of motion.  Skin:    General: Skin is warm.  Neurological:     General: No focal deficit present.     Mental Status: She is  alert and oriented to person, place, and time.  Psychiatric:        Mood and Affect: Mood normal.     (all labs ordered are listed, but only abnormal results are displayed) Labs Reviewed  CBC WITH DIFFERENTIAL/PLATELET - Abnormal; Notable for the following components:      Result Value   MCH 25.9 (*)    MCHC 29.9 (*)    All other components within normal limits  RESP PANEL BY RT-PCR (RSV, FLU A&B, COVID)  [id number]  BASIC METABOLIC PANEL WITH GFR    EKG: None  Radiology: No results found.   Procedures   Medications Ordered in the ED  albuterol  (PROVENTIL ) (2.5 MG/3ML) 0.083% nebulizer solution (10 mg/hr Nebulization Given 11/16/24 1444)                                    Medical Decision Making Patient complains of shortness of breath.  Patient began experiencing an exacerbation of her asthma several days ago.  Patient reports it is worse today.  Amount and/or Complexity of Data Reviewed Independent Historian:     Details: And is here with family who is supportive Labs: ordered.    Details: Labs ordered reviewed and interpreted Radiology: ordered.    Details: Chest x-ray ordered  Risk Prescription drug management. Risk Details: Patient is started on a 1 hour continuous neb. Patient's care is turned over to oncoming provider Ileana Eck, PA-C.  Chest x-ray flu COVID RSV are pending patient is receiving a continuing neb.        Final diagnoses:  Moderate asthma with exacerbation, unspecified whether persistent    ED Discharge Orders     None          Kharson Rasmusson K, PA-C 11/16/24 1508    Suzette Pac, MD 11/16/24 1609

## 2024-11-16 NOTE — ED Triage Notes (Signed)
 Pt arriving via [facility] with asthma exacerbation. Pt reports increasing ShOB x2 weeks. Pt reports she did not use her home bed tx because they may have been expired has been using inhaler. Pt received 2 duo neb tx, 2g Mag, and 125mg  Solumedrol prior to arrival. O2 sat was in mid 80's on room air.

## 2024-11-16 NOTE — ED Notes (Addendum)
 Pt remained 97-98 RA and tachypnenic at 22-25 while ambulating to the bathroom and back. Pt did have to stop a couple times but not due to [facility] but to needing to cough.

## 2024-11-16 NOTE — ED Provider Notes (Signed)
 Patient signed out to myself by Darice Mclean, PA-C pending continuous nebulizer, labs, and imaging.  In short patient is a 68 year old female with a past medical history significant for asthma presenting today with shortness of breath that is increased over the last 2 weeks.  Patient has been given DuoNebs, Solu-Medrol , and IV magnesium .  Patient denies fever, nausea, vomiting, or chills.  Physical Exam Vitals and nursing note reviewed.  Constitutional:      General: She is not in acute distress.    Appearance: She is not toxic-appearing.  HENT:     Head: Normocephalic and atraumatic.     Nose: Nose normal. No congestion.     Mouth/Throat:     Mouth: Mucous membranes are moist.  Eyes:     Extraocular Movements: Extraocular movements intact.  Cardiovascular:     Rate and Rhythm: Normal rate and regular rhythm.     Pulses: Normal pulses.     Heart sounds: Normal heart sounds.  Pulmonary:     Effort: Prolonged expiration present.     Breath sounds: Wheezing present.     Comments: Significant wheezing in all lobes Musculoskeletal:        General: Normal range of motion.     Cervical back: Neck supple.  Skin:    General: Skin is warm and dry.     Capillary Refill: Capillary refill takes less than 2 seconds.  Neurological:     General: No focal deficit present.     Mental Status: She is alert and oriented to person, place, and time.      Labs: Respiratory panel negative, CBC unremarkable, BMP unremarkable  Imaging: Chest x-ray showing clear lungs, no pleural effusion or pneumothorax.  Consulted Hospitalist, Dr. Arlon who was agreeable to admission     Roberta [name] 11/16/24 1800    Dean Clarity, MD 11/16/24 1836

## 2024-11-17 DIAGNOSIS — J45901 Unspecified asthma with (acute) exacerbation: Secondary | ICD-10-CM | POA: Diagnosis not present

## 2024-11-17 DIAGNOSIS — E66813 Obesity, class 3: Secondary | ICD-10-CM | POA: Diagnosis not present

## 2024-11-17 DIAGNOSIS — K219 Gastro-esophageal reflux disease without esophagitis: Secondary | ICD-10-CM | POA: Diagnosis not present

## 2024-11-17 DIAGNOSIS — J9601 Acute respiratory failure with hypoxia: Secondary | ICD-10-CM | POA: Diagnosis not present

## 2024-11-17 LAB — CBC
HCT: 41.4 % (ref 36.0–46.0)
Hemoglobin: 12.3 g/dL (ref 12.0–15.0)
MCH: 25.6 pg — ABNORMAL LOW (ref 26.0–34.0)
MCHC: 29.7 g/dL — ABNORMAL LOW (ref 30.0–36.0)
MCV: 86.1 fL (ref 80.0–100.0)
Platelets: 295 K/uL (ref 150–400)
RBC: 4.81 MIL/uL (ref 3.87–5.11)
RDW: 14.7 % (ref 11.5–15.5)
WBC: 4.6 K/uL (ref 4.0–10.5)
nRBC: 0 % (ref 0.0–0.2)

## 2024-11-17 LAB — HIV ANTIBODY (ROUTINE TESTING W REFLEX): HIV Screen 4th Generation wRfx: NONREACTIVE

## 2024-11-17 LAB — BASIC METABOLIC PANEL WITH GFR
Anion gap: 11 (ref 5–15)
BUN: 15 mg/dL (ref 8–23)
CO2: 25 mmol/L (ref 22–32)
Calcium: 9.3 mg/dL (ref 8.9–10.3)
Chloride: 105 mmol/L (ref 98–111)
Creatinine, Ser: 0.85 mg/dL (ref 0.44–1.00)
GFR, Estimated: >60 mL/min (ref >60)
Glucose, Bld: 157 mg/dL — ABNORMAL HIGH (ref 70–99)
Potassium: 4.3 mmol/L (ref 3.5–5.1)
Sodium: 140 mmol/L (ref 135–145)

## 2024-11-17 LAB — GLUCOSE, CAPILLARY: Glucose-Capillary: 175 mg/dL — ABNORMAL HIGH (ref 70–99)

## 2024-11-17 MED ORDER — METHYLPREDNISOLONE SODIUM SUCC 40 MG IJ SOLR
40.0000 mg | Freq: Two times a day (BID) | INTRAMUSCULAR | Status: DC
  Administered 2024-11-17 – 2024-11-18 (×3): 40 mg via INTRAVENOUS
  Filled 2024-11-17 (×3): qty 1

## 2024-11-17 MED ORDER — IPRATROPIUM-ALBUTEROL 0.5-2.5 (3) MG/3ML IN SOLN
3.0000 mL | Freq: Four times a day (QID) | RESPIRATORY_TRACT | Status: DC
  Administered 2024-11-17 – 2024-11-18 (×4): 3 mL via RESPIRATORY_TRACT
  Filled 2024-11-17 (×4): qty 3

## 2024-11-17 NOTE — Progress Notes (Signed)
 Progress Note   Patient: Roberta [name] FMW:[id number] DOB: 02-12-1956 DOA: 11/16/2024  DOS: the patient was seen and examined on 11/17/2024   Brief hospital course:  49F with Hx of asthma, hypertension, hyperlipidemia, obesity, GERD, recurrent provoked DVT on low-dose Eliquis , presenting with worsening cough and shortness of breath.   Assessment and Plan:   Acute hypoxic respiratory failure secondary to acute asthma exacerbation - Reportedly requiring supplemental oxygen on presentation secondary to dyspnea and tachypnea.  Showing mild improvement in hypoxia but still having diffuse bilateral wheezing despite continuous albuterol  nebs and multiple DuoNebs.  Patient has reported allergy to corticosteroids so we will hold off initially.  However discussion with patient stating that her allergy is for a different medication and she has been able to tolerate prednisone  many times before.  Will initiate IV Solu-Medrol  40 mg every 12 hours.  Continue scheduled DuoNebs every 4 hours, albuterol  as needed.  Mucinex , Tessalon  perles.  Supplemental O2 as needed.  Will hold off on antibiotics given no purulence or increased sputum production.  Given no worsening hypoxia or dyspnea, will transition out of progressive to MedSurg.  History of provoked DVT - Continue Eliquis  2.5 mg twice daily.   GERD - Continue Protonix 40 mg daily.   Hypertension/hyperlipidemia - Resume patient's home medication regiment.   Obesity - Patient with class III obesity, weight greater than 110 kg.  Encourage lifestyle modifications and dieting.  Being evaluated for GLP-1 in the outpatient setting.   Subjective: Patient resting comfortably this morning.  Still has dry cough bilateral wheezing.  Improved from presentation but still not resolved.  Patient stated she attempted to ambulate in the room became dizzy and dyspneic.  Not quite back to her baseline still feels anxious about leaving today despite her initial  enthusiasm.  Denies any fever, purulent sputum, chest pain, nausea, vomiting, abdominal pain.  Physical Exam:  Vitals:   11/17/24 0754 11/17/24 0911 11/17/24 1004 11/17/24 1008  BP: (!) 128/93   (!) 143/86  Pulse: 68     Resp: 20     Temp: (!) 97.5 F (36.4 C)     TempSrc:      SpO2: 98% 97%    Weight:   112 kg   Height:   5' 6 (1.676 m)     GENERAL:  Alert, pleasant, mild acute distress, obese HEENT:  EOMI CARDIOVASCULAR:  RRR, no murmurs appreciated RESPIRATORY: Dry cough, dyspnea, bilateral wheezing appreciated GASTROINTESTINAL:  Soft, nontender, nondistended EXTREMITIES:  No LE edema bilaterally NEURO:  No new focal deficits appreciated SKIN:  No rashes noted PSYCH:  Appropriate mood and affect, anxious   Data Reviewed:  Imaging Studies: DG Chest Port 1 View Result Date: 11/16/2024 CLINICAL DATA:  Cough.  Increasing shortness of breath. EXAM: PORTABLE CHEST 1 VIEW COMPARISON:  04/27/2024 and prior exams.  CT, 04/20/2024. FINDINGS: Cardiac silhouette is normal in size. No mediastinal or hilar masses. Lungs are clear.  No pleural effusion or pneumothorax. Skeletal structures are grossly intact. IMPRESSION: No active disease. Electronically Signed   By: Alm Parkins M.D.   On: 11/16/2024 15:10    There are no new results to review at this time.  Previous records (including but not limited to H&P, progress notes, nursing notes, TOC management) were reviewed in assessment of this patient.  Labs: CBC: Recent Labs  Lab 11/16/24 1433 11/17/24 0433  WBC 4.5 4.6  NEUTROABS 1.9  --   HGB 12.0 12.3  HCT 40.1 41.4  MCV 86.6 86.1  PLT 273 295   Basic Metabolic Panel: Recent Labs  Lab 11/16/24 1433 11/17/24 0433  NA 140 140  K 4.5 4.3  CL 104 105  CO2 24 25  GLUCOSE 165* 157*  BUN 14 15  CREATININE 0.96 0.85  CALCIUM  9.0 9.3   Liver Function Tests: No results for input(s): AST, ALT, ALKPHOS, BILITOT, PROT, ALBUMIN in the last 168  hours. CBG: Recent Labs  Lab 11/17/24 0856  GLUCAP 175*    Scheduled Meds:  apixaban   2.5 mg Oral BID   dorzolamide -timolol   1 drop Both Eyes BID   irbesartan  150 mg Oral Daily   And   hydrochlorothiazide   12.5 mg Oral Daily   ipratropium-albuterol   3 mL Nebulization Q6H   methylPREDNISolone  (SOLU-MEDROL ) injection  40 mg Intravenous Q12H   pantoprazole  40 mg Oral Daily   rosuvastatin   10 mg Oral Daily   Travoprost  (BAK Free)  1 drop Both Eyes QHS   Continuous Infusions: PRN Meds:.albuterol , hydrALAZINE, ondansetron  **OR** ondansetron  (ZOFRAN ) IV, polyethylene glycol, traMADol , traZODone  Family Communication: None at bedside  Disposition: Status is: Inpatient Remains inpatient appropriate because: Asthma exacerbation     Time spent: 35 minutes  Length of inpatient stay: 1 days  Author: Carliss LELON Canales, DO 11/17/2024 10:59 AM  For on call review [email or web address].

## 2024-11-17 NOTE — Hospital Course (Addendum)
 34F with Hx of asthma, hypertension, hyperlipidemia, obesity, GERD, recurrent provoked DVT on low-dose Eliquis , presenting with worsening cough and shortness of breath.    Assessment and Plan:   Acute hypoxic respiratory failure secondary to acute asthma exacerbation - Reportedly requiring supplemental oxygen on presentation secondary to dyspnea and tachypnea.  Showing mild improvement in hypoxia but still having diffuse bilateral wheezing despite continuous albuterol  nebs and multiple DuoNebs.  Patient has reported allergy to corticosteroids so we will hold off initially.  However discussion with patient stating that her allergy is for a different medication and she has been able to tolerate prednisone  many times before.  Will initiate IV Solu-Medrol  40 mg every 12 hours.  Continue scheduled DuoNebs every 4 hours, albuterol  as needed.  Mucinex , Tessalon  perles.  Supplemental O2 as needed.  Will hold off on antibiotics given no purulence or increased sputum production.  Given no worsening hypoxia or dyspnea, will transition out of progressive to MedSurg.   History of provoked DVT - Continue Eliquis  2.5 mg twice daily.   GERD - Continue Protonix 40 mg daily.   Hypertension/hyperlipidemia - Resume patient's home medication regiment.   Obesity - Patient with class III obesity, weight greater than 110 kg.  Encourage lifestyle modifications and dieting.  Being evaluated for GLP-1 in the outpatient setting.

## 2024-11-18 DIAGNOSIS — Z6841 Body Mass Index (BMI) 40.0 and over, adult: Secondary | ICD-10-CM | POA: Diagnosis not present

## 2024-11-18 DIAGNOSIS — J45901 Unspecified asthma with (acute) exacerbation: Secondary | ICD-10-CM | POA: Diagnosis not present

## 2024-11-18 DIAGNOSIS — E66813 Obesity, class 3: Secondary | ICD-10-CM | POA: Diagnosis not present

## 2024-11-18 DIAGNOSIS — J9601 Acute respiratory failure with hypoxia: Secondary | ICD-10-CM | POA: Diagnosis not present

## 2024-11-18 LAB — CBC
HCT: 40.3 % (ref 36.0–46.0)
Hemoglobin: 12 g/dL (ref 12.0–15.0)
MCH: 25.8 pg — ABNORMAL LOW (ref 26.0–34.0)
MCHC: 29.8 g/dL — ABNORMAL LOW (ref 30.0–36.0)
MCV: 86.5 fL (ref 80.0–100.0)
Platelets: 283 K/uL (ref 150–400)
RBC: 4.66 MIL/uL (ref 3.87–5.11)
RDW: 14.7 % (ref 11.5–15.5)
WBC: 7.3 K/uL (ref 4.0–10.5)
nRBC: 0 % (ref 0.0–0.2)

## 2024-11-18 LAB — BASIC METABOLIC PANEL WITH GFR
Anion gap: 9 (ref 5–15)
BUN: 24 mg/dL — ABNORMAL HIGH (ref 8–23)
CO2: 27 mmol/L (ref 22–32)
Calcium: 9.4 mg/dL (ref 8.9–10.3)
Chloride: 105 mmol/L (ref 98–111)
Creatinine, Ser: 0.89 mg/dL (ref 0.44–1.00)
GFR, Estimated: >60 mL/min (ref >60)
Glucose, Bld: 175 mg/dL — ABNORMAL HIGH (ref 70–99)
Potassium: 4.9 mmol/L (ref 3.5–5.1)
Sodium: 141 mmol/L (ref 135–145)

## 2024-11-18 MED ORDER — ALBUTEROL SULFATE (2.5 MG/3ML) 0.083% IN NEBU: 2.5000 mg | INHALATION_SOLUTION | Freq: Four times a day (QID) | RESPIRATORY_TRACT | 1 refills | Status: AC | PRN

## 2024-11-18 MED ORDER — PREDNISONE 10 MG PO TABS: ORAL_TABLET | ORAL | 0 refills | Status: AC

## 2024-11-18 MED ORDER — ALBUTEROL SULFATE HFA 108 (90 BASE) MCG/ACT IN AERS: 2.0000 | INHALATION_SPRAY | Freq: Four times a day (QID) | RESPIRATORY_TRACT | 3 refills | Status: AC | PRN

## 2024-11-18 NOTE — Evaluation (Signed)
 Physical Therapy Evaluation Patient Details Name: Roberta [name] MRN: [id number] DOB: 1956/07/04 Today's Date: 11/18/2024  History of Present Illness  Pt admitted from home 2* SOB 2* asthma exacerbation with acute hypoxic respiratory failure.  Pt with hx of asthma, DDD, back surgery and htn  Clinical Impression  This delightful pt admitted as above and presenting with functional mobility limitations 2* decreased activity tolerance and mild ambulatory balance deficits.  Pt up to ambulate 450' in hall with [facility] and progressing to mod I level.  Pt with noted increased WOB but with O2 sats maintained at 94% or higher and max HR 103 on RA.  Pt pleased with improvement since admit to [facility] and eager for dc.  Will sign off PT services at this time and defer further mobilization to mobility team for this hospital stay.        If plan is discharge home, recommend the following:     Can travel by private vehicle        Equipment Recommendations None recommended by PT  Recommendations for Other Services       Functional Status Assessment Patient has had a recent decline in their functional status and demonstrates the ability to make significant improvements in function in a reasonable and predictable amount of time.     Precautions / Restrictions Precautions Precautions: Fall Restrictions Weight Bearing Restrictions Per Provider Order: No      Mobility  Bed Mobility Overal bed mobility: Modified Independent             General bed mobility comments: no physical assist    Transfers Overall transfer level: Modified independent Equipment used: Straight cane               General transfer comment: mild instability with initial stand but no LOB    Ambulation/Gait Ambulation/Gait assistance: Supervision, Modified independent (Device/Increase time) Gait Distance (Feet): 450 Feet Assistive device: Straight cane Gait Pattern/deviations: Step-through pattern, Decreased step  length - right, Decreased step length - left, Shuffle, Wide base of support Gait velocity: mod pace     General Gait Details: Mild initial instabilty but no overt LOB and stability improved with increased distance.  Pt progressed to ambulating mod I with SPC and on RA  Stairs            Wheelchair Mobility     Tilt Bed    Modified Rankin (Stroke Patients Only)       Balance Overall balance assessment: Modified Independent                                           Pertinent Vitals/Pain Pain Assessment Pain Assessment: No/denies pain    Home Living Family/patient expects to be discharged to:: Private residence Living Arrangements: Alone Available Help at Discharge: Family;Available PRN/intermittently Type of Home: House Home Access: Level entry       Home Layout: One level Home Equipment: Cane - single point Additional Comments: Plans to stay with family initially    Prior Function Prior Level of Function : Independent/Modified Independent             Mobility Comments: using cane for ambulation outside.  Was walking over a mile on regular basis       Extremity/Trunk Assessment   Upper Extremity Assessment Upper Extremity Assessment: Overall WFL for tasks assessed    Lower Extremity Assessment Lower Extremity  Assessment: Overall WFL for tasks assessed       Communication   Communication Communication: No apparent difficulties    Cognition Arousal: Alert Behavior During Therapy: WFL for tasks assessed/performed   PT - Cognitive impairments: No apparent impairments                         Following commands: Intact       Cueing Cueing Techniques: Verbal cues     General Comments      Exercises     Assessment/Plan    PT Assessment Patient needs continued PT services  PT Problem List Decreased activity tolerance;Decreased balance;Decreased mobility       PT Treatment Interventions DME  instruction;Gait training;Stair training;Functional mobility training;Therapeutic activities;Therapeutic exercise;Balance training;Patient/family education    PT Goals (Current goals can be found in the Care Plan section)  Acute Rehab PT Goals Patient Stated Goal: Regain IND PT Goal Formulation: All assessment and education complete, DC therapy    Frequency Min 2X/week     Co-evaluation               AM-PAC PT 6 Clicks Mobility  Outcome Measure Help needed turning from your back to your side while in a flat bed without using bedrails?: None Help needed moving from lying on your back to sitting on the side of a flat bed without using bedrails?: None Help needed moving to and from a bed to a chair (including a wheelchair)?: None Help needed standing up from a chair using your arms (e.g., wheelchair or bedside chair)?: None Help needed to walk in hospital room?: None Help needed climbing 3-5 steps with a railing? : A Little 6 Click Score: 23    End of Session Equipment Utilized During Treatment: Gait belt Activity Tolerance: Patient tolerated treatment well Patient left: in chair;with call bell/phone within reach Nurse Communication: Mobility status PT Visit Diagnosis: Difficulty in walking, not elsewhere classified (R26.2)    Time: 8891-8871 PT Time Calculation (min) (ACUTE ONLY): 20 min   Charges:   PT Evaluation $PT Eval Low Complexity: 1 Low   PT General Charges $$ ACUTE PT VISIT: 1 Visit         [facility] PT Acute Rehabilitation Services Office [phone]   Naesha Buckalew 11/18/2024, 12:28 PM

## 2024-11-18 NOTE — TOC Initial Note (Signed)
 Transition of Care [facility]) - Initial/Assessment Note    Patient Details  Name: Roberta [name] MRN: [id number] Date of Birth: [birth year]  [facility] CM/SW Contact:    Sonda Manuella Quill, RN Phone Number: 11/18/2024, 10:12 AM  Clinical Narrative:                 Spoke w/ pt and son Braylyn Eye [phone]) in room; pt said she lives at home and plans to return w/ family support at d/c; he will provide transportation; pt verified insurance/PCP; she denied SDOH risks; pt has cane; she does not have home oxygen; pt agreed to receive recc nebulizer; she does not have an agency preference; referral given to Jermaine at [facility]; he said agency will deliver DME to pt's room prior to discharge; pt/son notified; agency contact info placed in follow up provider section of d/c instructions; also pt's son expressed concern regarding pt d/c; Dr Arlon notified via secure chat; awaiting PT eval; IP CM is following.  Expected Discharge Plan: Home/Self Care Barriers to Discharge: Continued Medical Work up   Patient Goals and CMS Choice Patient states their goals for this hospitalization and ongoing recovery are:: home CMS Medicare.gov Compare Post Acute Care list provided to:: Patient        Expected Discharge Plan and Services   Discharge Planning Services: CM Consult   Living arrangements for the past 2 months: Apartment Expected Discharge Date: 11/18/24               DME Arranged: Nebulizer machine DME Agency: [employer] Date DME Agency Contacted: 11/18/24 Time DME Agency Contacted: [phone]              Prior Living Arrangements/Services Living arrangements for the past 2 months: Apartment Lives with:: Self Patient language and need for interpreter reviewed:: Yes Do you feel safe going back to the place where you live?: Yes      Need for Family Participation in Patient Care: Yes (Comment) Care giver support system in place?: Yes (comment) Current home  services: DME (cane) Criminal Activity/Legal Involvement Pertinent to Current Situation/Hospitalization: No - Comment as needed  Activities of Daily Living   ADL Screening (condition at time of admission) Independently performs ADLs?: Yes (appropriate for developmental age) Is the patient deaf or have difficulty hearing?: Yes Does the patient have difficulty seeing, even when wearing glasses/contacts?: No Does the patient have difficulty concentrating, remembering, or making decisions?: No  Permission Sought/Granted Permission sought to share information with : Case Manager Permission granted to share information with : Yes, Verbal Permission Granted  Share Information with NAME: Case Manager     Permission granted to share info w Relationship: Briauna Gilmartin (son) [phone]     Emotional Assessment Appearance:: Appears stated age Attitude/Demeanor/Rapport: Gracious Affect (typically observed): Accepting Orientation: : Oriented to Self, Oriented to Place, Oriented to  Time, Oriented to Situation Alcohol / Substance Use: Not Applicable Psych Involvement: No (comment)  Admission diagnosis:  Asthma exacerbation [J45.901] Moderate asthma with exacerbation, unspecified whether persistent [J45.901] Patient Active Problem List   Diagnosis Date Noted   Asthma exacerbation 11/16/2024   Acute hypoxic respiratory failure (HCC) 11/16/2024   Urge incontinence 03/01/2024   Multiple Provoked DVTs 01/24/2024   Right hip pain 10/05/2023   Skin tag 02/23/2023   Dysfunction of eustachian tube 01/26/2023   Foot pain, bilateral 11/03/2022   Right leg paresthesias 08/11/2022   History of anaphylaxis 09/12/2019   Pre-diabetes 05/23/2019   Neuropathy 04/18/2018  Pineal gland cyst 10/01/2015   Exophthalmos 10/01/2015   Endometrial cancer s/p Hysterectomy (HCC) 08/11/2015   Hearing loss, sensorineural, asymmetrical 01/19/2015   Benign paroxysmal positional vertigo 09/24/2014   Uterine  fibroid 01/04/2014   Cervical disc disorder with radiculopathy of cervical region 10/24/2013   Glaucoma    GERD (gastroesophageal reflux disease) 09/09/2011   Gastric polyp 09/09/2011   Allergic rhinitis 10/14/2010   BREAST MASS, BENIGN 02/04/2010   BACK PAIN, CHRONIC 03/25/2009   HYPERLIPIDEMIA 02/16/2007   Obesity 02/16/2007   HYPERTENSION, BENIGN SYSTEMIC 02/16/2007   Asthma 02/16/2007   PCP:  [name], MD Pharmacy:   [facility] [phone] [address], [place] - [address] AT [facility] [address] [facility] [address] [place] [address] Phone: [phone] Fax: [phone]  [facility] [address] [place] [address] Phone: [phone] Fax: [phone]     Social Drivers of Health (SDOH) Social History: SDOH Screenings   Food Insecurity: No Food Insecurity (11/18/2024)  Recent Concern: Food Insecurity - Food Insecurity Present (08/22/2024)  Housing: Low Risk  (11/18/2024)  Transportation Needs: No Transportation Needs (11/18/2024)  Recent Concern: Transportation Needs - Unmet Transportation Needs (08/22/2024)  Utilities: Not At Risk (11/18/2024)  Alcohol Screen: Low Risk  (05/10/2024)  Depression (PHQ2-9): Low Risk  (05/10/2024)  Financial Resource Strain: Low Risk  (08/22/2024)  Physical Activity: Insufficiently Active (08/22/2024)  Social Connections: Unknown (11/16/2024)  Stress: No Stress Concern Present (08/22/2024)  Tobacco Use: Low Risk  (11/16/2024)   SDOH Interventions: Food Insecurity Interventions: Intervention Not Indicated, Inpatient TOC Housing Interventions: Intervention Not Indicated, Inpatient TOC Transportation Interventions: Intervention Not Indicated, Inpatient TOC Utilities Interventions: Intervention Not Indicated, Inpatient TOC   Readmission Risk Interventions     No data to display

## 2024-11-18 NOTE — Discharge Summary (Signed)
 Physician Discharge Summary   Patient: Roberta [name] MRN: [id number] DOB: 11-13-1956  Admit date:     11/16/2024  Discharge date: 11/18/24  Discharge Physician: Carliss LELON Canales   PCP: [name], MD   Recommendations at discharge:    Pt to be discharged home.   If you experience worsening fever, chills, chest pain, shortness of breath, or other concerning symptoms, please call your PCP or go to the emergency department immediately.  Discharge Diagnoses: Principal Problem:   Asthma exacerbation Active Problems:   Obesity   GERD (gastroesophageal reflux disease)   Acute hypoxic respiratory failure (HCC)  Resolved Problems:   * No resolved hospital problems. [facility] Course:  69F with Hx of asthma, hypertension, hyperlipidemia, obesity, GERD, recurrent provoked DVT on low-dose Eliquis , presenting with worsening cough and shortness of breath.    Assessment and Plan:   Acute hypoxic respiratory failure secondary to acute asthma exacerbation - Reportedly requiring supplemental oxygen on presentation secondary to dyspnea and tachypnea.  Showing mild hypoxia and diffuse bilateral wheezing despite continuous albuterol  nebs and multiple DuoNebs.  Patient has reported allergy to corticosteroids so held off initially.  However discussion with patient stating that her allergy is for a different medication and she has been able to tolerate prednisone  many times before.  Initiated IV Solu-Medrol  40 mg every 12 hours.  Continue scheduled DuoNebs every 4 hours, albuterol  as needed.  Mucinex , Tessalon  perles.  Supplemental O2 as needed.  Help off on antibiotics given no purulence or increased sputum production.  Over the course hospital stay showed improvement in hypoxia, dyspnea, wheezing.  Patient ambulatory and eager for discharge home.  Will transition patient to p.o. prednisone  taper to take as directed upon discharge.  Will also give prescription for nebs, nebulizer,  MDI.  History of provoked DVT - Continue Eliquis  2.5 mg twice daily.   GERD - Continue Protonix 40 mg daily.   Hypertension/hyperlipidemia - Resume patient's home medication regiment.   Obesity - Patient with class III obesity, weight greater than 110 kg.  Encourage lifestyle modifications and dieting.  Being evaluated for GLP-1 in the outpatient setting.   Consultants: None Procedures performed: None Disposition: Home Diet recommendation:  Discharge Diet Orders (From admission, onward)     Start     Ordered   11/18/24 0000  Diet - low sodium heart healthy        11/18/24 0934           Cardiac diet  DISCHARGE MEDICATION: Allergies as of 11/18/2024       Reactions   Influenza Vaccines Anaphylaxis   Throat swelling reported after flu shot in the past    Budesonide Other (See Comments)   Throat infection per patient   Flonase [fluticasone Propionate] Other (See Comments)   Upper respitory issues - lead to time of pneumonia hospitalization   Hydrocodone-acetaminophen  Nausea And Vomiting   Effective for pain but had GI side effects   Porcine (pork) Protein-containing Drug Products    Morphine And Codeine Nausea And Vomiting   Nickel Hives, Rash   Phenobarbital Hypertension, Rash        Medication List     TAKE these medications    albuterol  (2.5 MG/3ML) 0.083% nebulizer solution Commonly known as: PROVENTIL  Take 3 mLs (2.5 mg total) by nebulization every 6 (six) hours as needed for wheezing or shortness of breath. What changed: Another medication with the same name was changed. Make sure you understand how and when to take  each.   albuterol  108 (90 Base) MCG/ACT inhaler Commonly known as: VENTOLIN  HFA Inhale 2 puffs into the lungs every 6 (six) hours as needed for wheezing or shortness of breath. What changed: See the new instructions.   apixaban  2.5 MG Tabs tablet Commonly known as: ELIQUIS  Take 1 tablet (2.5 mg total) by mouth 2 (two) times  daily. What changed: additional instructions   BIOTIN PO Take 1 capsule by mouth daily.   cetirizine  10 MG tablet Commonly known as: ZYRTEC  TAKE 1 TABLET(10 MG) BY MOUTH DAILY What changed: See the new instructions.   cyclobenzaprine  10 MG tablet Commonly known as: FLEXERIL  Take 10 mg by mouth daily as needed for muscle spasms.   dorzolamide -timolol  2-0.5 % ophthalmic solution Commonly known as: COSOPT  Place 1 drop into both eyes 2 (two) times daily. What changed: additional instructions   EMERGEN-C IMMUNE PO Take 1 packet by mouth as needed.   esomeprazole  20 MG capsule Commonly known as: NEXIUM  TAKE 1 CAPSULE(20 MG) BY MOUTH DAILY AS NEEDED FOR STOMACH ACID OR REFLUX What changed: See the new instructions.   meclizine  12.5 MG tablet Commonly known as: ANTIVERT  TAKE 1 TABLET(12.5 MG) BY MOUTH THREE TIMES DAILY AS NEEDED   olmesartan -hydrochlorothiazide  20-12.5 MG tablet Commonly known as: BENICAR  HCT TAKE 1 TABLET BY MOUTH DAILY   ondansetron  4 MG tablet Commonly known as: ZOFRAN  TAKE 1 TABLET BY MOUTH EVERY 8 HOURS AS NEEDED FOR NAUSEA OR VOMITING   predniSONE  10 MG tablet Commonly known as: DELTASONE  Take 4 tablets (40 mg total) by mouth daily for 3 days, THEN 3 tablets (30 mg total) daily for 3 days, THEN 2 tablets (20 mg total) daily for 3 days, THEN 1 tablet (10 mg total) daily for 3 days. Start taking on: November 18, 2024   rosuvastatin  10 MG tablet Commonly known as: Crestor  Take 1 tablet (10 mg total) by mouth daily.   traMADol  50 MG tablet Commonly known as: ULTRAM  Take 1 tablet (50 mg total) by mouth every 6 (six) hours as needed.   Travoprost  (BAK Free) 0.004 % Soln ophthalmic solution Commonly known as: TRAVATAN  Place 1 drop into both eyes at bedtime. What changed: additional instructions               Durable Medical Equipment  (From admission, onward)           Start     Ordered   11/18/24 0000  For home use only DME Nebulizer  machine       Question Answer Comment  Patient needs a nebulizer to treat with the following condition Asthma exacerbation   Length of Need 12 Months   Additional equipment included Administration kit   Additional equipment included Filter      11/18/24 0934             Discharge Exam: Filed Weights   11/17/24 1004  Weight: 112 kg    GENERAL:  Alert, pleasant, pleasant, obese HEENT:  EOMI CARDIOVASCULAR:  RRR, no murmurs appreciated RESPIRATORY: Poor air movement, no wheezing GASTROINTESTINAL:  Soft, nontender, nondistended EXTREMITIES:  No LE edema bilaterally NEURO:  No new focal deficits appreciated SKIN:  No rashes noted PSYCH:  Appropriate mood and affect, anxious   Condition at discharge: improving  The results of significant diagnostics from this hospitalization (including imaging, microbiology, ancillary and laboratory) are listed below for reference.   Imaging Studies: DG Chest Port 1 View Result Date: 11/16/2024 CLINICAL DATA:  Cough.  Increasing shortness of breath.  EXAM: PORTABLE CHEST 1 VIEW COMPARISON:  04/27/2024 and prior exams.  CT, 04/20/2024. FINDINGS: Cardiac silhouette is normal in size. No mediastinal or hilar masses. Lungs are clear.  No pleural effusion or pneumothorax. Skeletal structures are grossly intact. IMPRESSION: No active disease. Electronically Signed   By: Alm Parkins M.D.   On: 11/16/2024 15:10    Microbiology: Results for orders placed or performed during the hospital encounter of 11/16/24  Resp panel by RT-PCR (RSV, Flu A&B, Covid) Anterior Nasal Swab     Status: None   Collection Time: 11/16/24  2:27 PM   Specimen: Anterior Nasal Swab  Result Value Ref Range Status   SARS Coronavirus 2 by RT PCR NEGATIVE NEGATIVE Final    Comment: (NOTE) SARS-CoV-2 target nucleic acids are NOT DETECTED.  The SARS-CoV-2 RNA is generally detectable in upper respiratory specimens during the acute phase of infection. The lowest concentration  of SARS-CoV-2 viral copies this assay can detect is 138 copies/mL. A negative result does not preclude SARS-Cov-2 infection and should not be used as the sole basis for treatment or other patient management decisions. A negative result may occur with  improper specimen collection/handling, submission of specimen other than nasopharyngeal swab, presence of viral mutation(s) within the areas targeted by this assay, and inadequate number of viral copies(<138 copies/mL). A negative result must be combined with clinical observations, patient history, and epidemiological information. The expected result is Negative.  Fact Sheet for Patients:  bloggercourse.com  Fact Sheet for Healthcare Providers:  seriousbroker.it  This test is no t yet approved or cleared by the [place]  FDA and  has been authorized for detection and/or diagnosis of SARS-CoV-2 by FDA under an Emergency Use Authorization (EUA). This EUA will remain  in effect (meaning this test can be used) for the duration of the COVID-19 declaration under Section 564(b)(1) of the Act, 21 U.S.C.section 360bbb-3(b)(1), unless the authorization is terminated  or revoked sooner.       Influenza A by PCR NEGATIVE NEGATIVE Final   Influenza B by PCR NEGATIVE NEGATIVE Final    Comment: (NOTE) The Xpert Xpress SARS-CoV-2/FLU/RSV plus assay is intended as an aid in the diagnosis of influenza from Nasopharyngeal swab specimens and should not be used as a sole basis for treatment. Nasal washings and aspirates are unacceptable for Xpert Xpress SARS-CoV-2/FLU/RSV testing.  Fact Sheet for Patients: bloggercourse.com  Fact Sheet for Healthcare Providers: seriousbroker.it  This test is not yet approved or cleared by the [place]  FDA and has been authorized for detection and/or diagnosis of SARS-CoV-2 by FDA under an Emergency Use  Authorization (EUA). This EUA will remain in effect (meaning this test can be used) for the duration of the COVID-19 declaration under Section 564(b)(1) of the Act, 21 U.S.C. section 360bbb-3(b)(1), unless the authorization is terminated or revoked.     Resp Syncytial Virus by PCR NEGATIVE NEGATIVE Final    Comment: (NOTE) Fact Sheet for Patients: bloggercourse.com  Fact Sheet for Healthcare Providers: seriousbroker.it  This test is not yet approved or cleared by the [place]  FDA and has been authorized for detection and/or diagnosis of SARS-CoV-2 by FDA under an Emergency Use Authorization (EUA). This EUA will remain in effect (meaning this test can be used) for the duration of the COVID-19 declaration under Section 564(b)(1) of the Act, 21 U.S.C. section 360bbb-3(b)(1), unless the authorization is terminated or revoked.  Performed at [facility], [address] [place], [place] [address]     Labs: CBC:  Recent Labs  Lab 11/16/24 1433 11/17/24 0433 11/18/24 0506  WBC 4.5 4.6 7.3  NEUTROABS 1.9  --   --   HGB 12.0 12.3 12.0  HCT 40.1 41.4 40.3  MCV 86.6 86.1 86.5  PLT 273 295 283   Basic Metabolic Panel: Recent Labs  Lab 11/16/24 1433 11/17/24 0433 11/18/24 0506  NA 140 140 141  K 4.5 4.3 4.9  CL 104 105 105  CO2 24 25 27   GLUCOSE 165* 157* 175*  BUN 14 15 24*  CREATININE 0.96 0.85 0.89  CALCIUM  9.0 9.3 9.4   Liver Function Tests: No results for input(s): AST, ALT, ALKPHOS, BILITOT, PROT, ALBUMIN in the last 168 hours. CBG: Recent Labs  Lab 11/17/24 0856  GLUCAP 175*    Discharge time spent: 25 minutes.  Length of inpatient stay: 2 days  Signed: Carliss LELON Canales, DO [facility] 11/18/2024

## 2024-11-18 NOTE — Plan of Care (Signed)

## 2024-11-18 NOTE — TOC Transition Note (Signed)
 Transition of Care [facility]) - Discharge Note   Patient Details  Name: Roberta [name] MRN: [id number] Date of Birth: [birth year]  Transition of Care [facility]) CM/SW Contact:  Sonda Manuella Quill, RN Phone Number: 11/18/2024, 12:36 PM   Clinical Narrative:    D/C orders received; no IP CM needs.   Final next level of care: Home/Self Care Barriers to Discharge: No Barriers Identified   Patient Goals and CMS Choice Patient states their goals for this hospitalization and ongoing recovery are:: home CMS Medicare.gov Compare Post Acute Care list provided to:: Patient        Discharge Placement                       Discharge Plan and Services Additional resources added to the After Visit Summary for     Discharge Planning Services: CM Consult            DME Arranged: Nebulizer machine DME Agency: [employer] Date DME Agency Contacted: 11/18/24 Time DME Agency Contacted: 1011              Social Drivers of Health (SDOH) Interventions SDOH Screenings   Food Insecurity: No Food Insecurity (11/18/2024)  Recent Concern: Food Insecurity - Food Insecurity Present (08/22/2024)  Housing: Low Risk  (11/18/2024)  Transportation Needs: No Transportation Needs (11/18/2024)  Recent Concern: Transportation Needs - Unmet Transportation Needs (08/22/2024)  Utilities: Not At Risk (11/18/2024)  Alcohol Screen: Low Risk  (05/10/2024)  Depression (PHQ2-9): Low Risk  (05/10/2024)  Financial Resource Strain: Low Risk  (08/22/2024)  Physical Activity: Insufficiently Active (08/22/2024)  Social Connections: Unknown (11/16/2024)  Stress: No Stress Concern Present (08/22/2024)  Tobacco Use: Low Risk  (11/16/2024)     Readmission Risk Interventions     No data to display

## 2024-11-19 ENCOUNTER — Telehealth: Payer: Self-pay

## 2024-11-19 NOTE — Transitions of Care (Post Inpatient/ED Visit) (Signed)
 Today's TOC FU Call Status: Today's TOC FU Call Status:: Successful TOC FU Call Completed TOC FU Call Complete Date: 11/19/24  Patient's Name and Date of Birth confirmed. Name, DOB  Transition Care Management Follow-up Telephone Call Date of Discharge: 11/18/24 Discharge Facility: Roberta [name] [facility]) Type of Discharge: Inpatient Admission Primary Inpatient Discharge Diagnosis:: Asthma exacerbation How have you been since you were released from the hospital?: Much Better Any questions or concerns?: No  Items Reviewed: Did you receive and understand the discharge instructions provided?: Yes Medications obtained,verified, and reconciled?: Yes (Medications Reviewed) Any new allergies since your discharge?: No Dietary orders reviewed?: Yes Type of Diet Ordered:: Low Sodium, Heart Healthy Do you have support at home?: Yes People in Home [RPT]: child(ren), adult Name of Support/Comfort Primary Source: Roberta [name] (Son)  [phone] [facility]) (POA and HCPOA).  Medications Reviewed Today: Medications Reviewed Today     Reviewed by Carolee Heron NOVAK, RN (Case Manager) on 11/19/24 at 1246  Med List Status: <None>   Medication Order Taking? Sig Documenting Provider Last Dose Status Informant  albuterol  (PROVENTIL ) (2.5 MG/3ML) 0.083% nebulizer solution [id number] Yes Take 3 mLs (2.5 mg total) by nebulization every 6 (six) hours as needed for wheezing or shortness of breath. Arlon Carliss ORN, DO  Active   albuterol  (VENTOLIN  HFA) 108 [phone] Base) MCG/ACT inhaler [id number] Yes Inhale 2 puffs into the lungs every 6 (six) hours as needed for wheezing or shortness of breath. Arlon Carliss ORN, DO  Active   apixaban  (ELIQUIS ) 2.5 MG TABS tablet [id number] Yes Take 1 tablet (2.5 mg total) by mouth 2 (two) times daily. [name] ONEIDA MADISON, MD  Active Self, Pharmacy Records  BIOTIN PO [id number] Yes Take 1 capsule by mouth daily. [provider]  Active Self, Pharmacy Records  cetirizine  (ZYRTEC ) 10 MG  tablet [id number] Yes TAKE 1 TABLET(10 MG) BY MOUTH DAILY  Patient taking differently: Take 10 mg by mouth daily as needed for allergies. Only take if going outside or if nose is clogged.   [name], DO  Active Self, Pharmacy Records  cyclobenzaprine  (FLEXERIL ) 10 MG tablet [id number] Yes Take 10 mg by mouth daily as needed for muscle spasms. [provider]  Active Self, Pharmacy Records           Med Note [name], ALFREIDA [name]   Fri Nov 16, 2024  5:49 PM)    dorzolamide -timolol  (COSOPT ) 22.3-6.8 MG/ML ophthalmic solution [id number] Yes Place 1 drop into both eyes 2 (two) times daily.  Patient taking differently: Place 1 drop into both eyes 2 (two) times daily. 8am & 8pm   Rennie Toribio CROME, MD  Active Self, Pharmacy Records  esomeprazole  (NEXIUM ) 20 MG capsule [id number] Yes TAKE 1 CAPSULE(20 MG) BY MOUTH DAILY AS NEEDED FOR STOMACH ACID OR REFLUX [name], MD  Active Self, Pharmacy Records  meclizine  (ANTIVERT ) 12.5 MG tablet [id number] Yes TAKE 1 TABLET(12.5 MG) BY MOUTH THREE TIMES DAILY AS NEEDED [name], DO  Active Self, Pharmacy Records           Med Note SYDELL HERON NOVAK Pablo Nov 19, 2024 12:40 PM) Roberta [name] as needed.   Multiple Vitamins-Minerals (EMERGEN-C IMMUNE PO) [id number] Yes Take 1 packet by mouth as needed. [provider]  Active Self, Pharmacy Records  olmesartan -hydrochlorothiazide  (BENICAR  HCT) 20-12.5 MG tablet [id number] Yes TAKE 1 TABLET BY MOUTH DAILY Quillen, Michael, MD  Active Self, Pharmacy Records  ondansetron  (ZOFRAN ) 4 MG tablet [id number] Yes TAKE 1 TABLET BY MOUTH EVERY 8 HOURS  AS NEEDED FOR NAUSEA OR VOMITING [name], DO  Active Self, Pharmacy Records  predniSONE  (DELTASONE ) 10 MG tablet [id number] Yes Take 4 tablets (40 mg total) by mouth daily for 3 days, THEN 3 tablets (30 mg total) daily for 3 days, THEN 2 tablets (20 mg total) daily for 3 days, THEN 1 tablet (10 mg total) daily for 3 days. Arlon Carliss ORN, DO  Active   rosuvastatin   (CRESTOR ) 10 MG tablet [id number] Yes Take 1 tablet (10 mg total) by mouth daily. [name], MD  Active Self, Pharmacy Records  traMADol  (ULTRAM ) 50 MG tablet [id number] Yes Take 1 tablet (50 mg total) by mouth every 6 (six) hours as needed. Roberta Norris, MD  Active Self, Pharmacy Records  Travoprost , BAK Free, (TRAVATAN ) 0.004 % SOLN ophthalmic solution [id number] Yes Place 1 drop into both eyes at bedtime. [name], MD  Active Self, Pharmacy Records            Home Care and Equipment/Supplies: Were Home Health Services Ordered?: No Any new equipment or medical supplies ordered?: Yes Name of Medical supply agency?: [employer] D[employer] was delivered to room prior to discharge. Were you able to get the equipment/medical supplies?: Yes Do you have any questions related to the use of the equipment/supplies?: Yes What questions do you have?: Discussed patient will call [employer] for further equipment needs for nebulizer-- which was delivered to room before discharge.  Functional Questionnaire: Do you need assistance with bathing/showering or dressing?: No Do you need assistance with meal preparation?: No Do you need assistance with eating?: No Do you have difficulty maintaining continence: No Do you need assistance with getting out of bed/getting out of a chair/moving?: No Do you have difficulty managing or taking your medications?: No  Follow up appointments reviewed: PCP Follow-up appointment confirmed?: Yes Date of PCP follow-up appointment?: 11/21/24 Follow-up Provider: Sharper Alba (Checks every 3 months due to [facility] and kidney function) Specialist Hospital Follow-up appointment confirmed?: NA Do you need transportation to your follow-up appointment?: No (Benefit coverage.)  SDOH Interventions Today    Flowsheet Row Most Recent Value  SDOH Interventions   Food Insecurity Interventions Intervention Not Indicated, Inpatient TOC  Housing Interventions Intervention Not  Indicated, Inpatient TOC  Transportation Interventions Intervention Not Indicated, Patient Resources (Friends/Family), Payor Benefit  Utilities Interventions Intervention Not Indicated  Health Literacy Interventions Intervention Not Indicated   11/19/24: 155 pm: Completed successful post discharge outreach by telephone with patient.  Patient reports feeling much better since discharge from hospital.  Patient verbally agreed to enroll in program/follow up calls.  Next outreach call will be on 12/925 at 130 pm.  Patient is salient in self care management in general, but reviewed medications, discharge instructions, preventative measures, care gaps, follow up visits with PCP or any specialists (none). Reviewed DME orders and delivery confirmed, patient has contact number for [employer].  Discussed/Reviewed and used teach back for review of diagnosis in care plan including:  barriers or knowledge deficits,  self care for Asthma, HTN, DVT prevention what to call providers for,  potential payor benefits,  action plan for asthma,  support system in place with family. The patient has been provided with contact information for the care management team and has been advised to call with any health-related questions or concerns.  The patient verbalized understanding with current POC.  The patient is directed to their insurance card regarding availability of benefits coverage     Bing Edison MSN, RN RN Case Manager [place]  VBCI-Population Health Office Hours M-F [phone] Direct Dial: [phone] Main Phone [phone]  Fax: [phone] [email or web address]

## 2024-11-19 NOTE — Patient Instructions (Signed)
 Visit Information  Thank you for taking time to visit with me today. Please don't hesitate to contact me if I can be of assistance to you before our next scheduled telephone appointment.  Our next appointment is by telephone on 11/27/24 at 130 pm  Following is a copy of your care plan:   Goals Addressed             This Visit's Progress    VBCI Transitions of Care (TOC) Care Plan       Problems:  Recent Hospitalization for treatment of Asthma, HTN, and History of DVT on Eliquis  Knowledge Deficit Related to Asthma Exacerbation, self care for HTN and DVT prevention/knowledge.   Goal:  Over the next 30 days, the patient will not experience hospital readmission  Interventions:   Asthma: Provided patient with basic written and verbal Asthma education on self care/management/and exacerbation prevention Advised patient to track and manage Asthma triggers Provided instruction about proper use of medications used for management of Asthma including inhalers and prednisone .  Discussed potential for increase in blood sugar while on tapering steroids.  Reviewed tapering schedule with patient and she has a written plan to help due to dyslexia.  Advised patient to self assesses Asthma action plan zone and make appointment with provider if in the yellow zone for 48 hours without improvement. Discussed/review triggers: for patient it is seasonal weather changes.  Patient has action plan and used teach back to review actions she takes when inhalers and nebulizer do not provider relief.  Encouraged to place phone numbers and action plan where family members or friends can see in an emergency and rationale.  Provided education about and advised patient to utilize infection prevention strategies to reduce risk of respiratory infection. Patient to start wearing mask in cold weather and ear muffs due to crystals in ears triggering symptoms.  Discussed the importance of adequate rest and management of  fatigue with Asthma: Patient has good self care management on review.  Screening for signs and symptoms of depression related to chronic disease state: Completed.  Assessed social determinant of health barriers: Completed.  Directed patient to website for printable Action Plan by [employer] by DHHS and NIH at [employer]fi and discuss with PCP for any changes needed to patient's needs/plan.   Generic and DVT Interventions:  Evaluation of current treatment plan related to Asthma, HTN, and DVT prevention,  self-management and patient's adherence to plan as established by provider. Discussed plans with patient for ongoing care management follow up and provided patient with direct contact information for care management team Evaluation of current treatment plan related to Asthma exacerbation and patient's adherence to plan as established by provider Advised patient to provide appropriate vaccination information to provider or care management team member at next visit Provided education to patient re: Care Gaps to discuss with PCP, including vaccinations as patient allergic to flu vaccine, education on Shingles vaccine with history of chickenpox as a child to review with PCP.  Reviewed medications with patient and discussed Precaution to watch for on Eliquis , reviewed all medications and inhalers/nebulizers.  Reviewed scheduled/upcoming provider appointments including PCP appointment in 11/21/24.  Discussed plans with patient for ongoing care management follow up and provided patient with direct contact information for care management team Advised patient to discuss Care Gaps noted and discussed with provider including annual wellness visit and labwork while in Eliquis  PCP follows every 3 months.  Discussed prior DVT's due to long travel one time and dehydration due to flu  another time.  Reviewed preventative measures, Teach back on signs and symptoms of  DVT and what to call provider for.  Eliquis  medication review with teach back.   Hypertension Interventions: Discussed starting to keep a log to record blood pressures at home with equipment in place for capturing blood pressure trends to take to providers.  Last practice recorded BP readings:  BP Readings from Last 3 Encounters:  11/18/24 136/80  08/23/24 129/78  08/17/24 (!) 146/88   Most recent eGFR/CrCl:  Lab Results  Component Value Date   EGFR 61 01/09/2024    No components found for: CRCL  Evaluation of current treatment plan related to hypertension self management and patient's adherence to plan as established by provider Reviewed medications with patient and discussed importance of compliance Discussed plans with patient for ongoing care management follow up and provided patient with direct contact information for care management team Advised patient, providing education and rationale, to monitor blood pressure daily and record, calling PCP for findings outside established parameters Provided education on prescribed diet Low sodium/cardiac/heart healthy.  Screening for signs and symptoms of depression related to chronic disease state  Assessed social determinant of health barriers  Patient Self Care Activities:  Attend all scheduled provider appointments Call pharmacy for medication refills 3-7 days in advance of running out of medications Call provider office for new concerns or questions  Participate in Transition of Care Program/Attend TOC scheduled calls Take medications as prescribed   check blood pressure 3 times per week choose a place to take my blood pressure (home, clinic or office, retail store) write blood pressure results in a log or diary keep a blood pressure log take blood pressure log to all doctor appointments keep all doctor appointments take medications for blood pressure exactly as prescribed eat more whole grains, fruits and vegetables, lean  meats and healthy fats Patient has SpO2 sensor and blood pressure monitor at home  Plan:  An initial telephone outreach has been scheduled for:  Next PCP appointment scheduled for: Tuesday 11/27/24 at 130 pm by telephone.         Patient verbalizes understanding of instructions and care plan provided today and agrees to view in MyChart. Active MyChart status and patient understanding of how to access instructions and care plan via MyChart confirmed with patient.     Telephone follow up appointment with care management team member scheduled for: Our next appointment is by telephone on 11/27/24 at 130 pm   Please call the care guide team at [phone] if you need to cancel or reschedule your appointment.   Please call the [place]  National Suicide Prevention Lifeline: (253)208-3175 or TTY: 469-121-4840 TTY 317-156-1499) to talk to a trained counselor call 1-800-273-TALK (toll free, 24 hour hotline) if you are experiencing a Mental Health or Behavioral Health Crisis or need someone to talk to.   Bing Edison MSN, RN RN Case Sales [facility]  VBCI-Population Health Office Hours M-F [phone] Direct Dial: [phone] Main Phone [phone]  Fax: [phone] [email or web address]

## 2024-11-21 ENCOUNTER — Encounter: Payer: Self-pay | Admitting: Family Medicine

## 2024-11-21 ENCOUNTER — Ambulatory Visit: Admitting: Family Medicine

## 2024-11-21 VITALS — BP 125/75 | HR 64 | Ht 66.0 in | Wt 246.2 lb

## 2024-11-21 DIAGNOSIS — J4520 Mild intermittent asthma, uncomplicated: Secondary | ICD-10-CM

## 2024-11-21 DIAGNOSIS — E119 Type 2 diabetes mellitus without complications: Secondary | ICD-10-CM

## 2024-11-21 LAB — POCT GLYCOSYLATED HEMOGLOBIN (HGB A1C): HbA1c, POC (controlled diabetic range): 7 % (ref 0.0–7.0)

## 2024-11-21 MED ORDER — SEMAGLUTIDE(0.25 OR 0.5MG/DOS) 2 MG/1.5ML ~~LOC~~ SOPN: 0.2500 mg | PEN_INJECTOR | SUBCUTANEOUS | 0 refills | Status: DC

## 2024-11-21 MED ORDER — SHINGRIX 50 MCG/0.5ML IM SUSR: INTRAMUSCULAR | 1 refills | Status: AC

## 2024-11-21 NOTE — Assessment & Plan Note (Signed)
 Hospital follow-up for asthma exacerbation. Continuing to improve, reassuring lung exam today. - Continue steroid taper - Continue prn albuterol  - Consider reintroducing maintenance steroid if attacks become more frequent

## 2024-11-21 NOTE — Patient Instructions (Addendum)
 It was great to see you! Thank you for allowing me to participate in your care!  Our plans for today:   VISIT SUMMARY: Today, we addressed your recent asthma exacerbation, diabetes management, and general health maintenance. We discussed your current medications and potential new treatments to help manage your conditions.  YOUR PLAN: ASTHMA WITH RECENT EXACERBATION: You recently experienced a severe asthma exacerbation likely due to seasonal changes and an upper respiratory infection. -Continue taking prednisone  as prescribed. -Use your albuterol  inhaler as needed. -Monitor for increased frequency of asthma attacks and consider using an inhaled steroid if necessary. -Use a spacer with your inhaler to prevent throat issues. -Report any changes in your symptoms to us .  TYPE 2 DIABETES MELLITUS: Your A1c has increased to 7.0, indicating diabetes. Your recent hospitalization and prednisone  use may have contributed to this increase. -We attempted to prescribe Ozempic  and checked your insurance coverage. -If Ozempic  is not covered, we may start you on metformin . -We will recheck your A1c in three months.  CLASS 3 SEVERE OBESITY: We discussed using Ozempic  for weight loss and diabetes management. -We attempted to prescribe Ozempic  for weight loss and diabetes management.  GENERAL HEALTH MAINTENANCE: We discussed your general health, including vaccinations and managing your cough. -We provided a printout for the shingles vaccination, which you should get when you are off prednisone . -We advised against the shingles virus antibody test due to cost and insurance issues. -We recommended Mucinex  without dextromethorphan for your cough management.      Please arrive 15 minutes PRIOR to your next scheduled appointment time! If you do not, this affects OTHER patients' care.  Take care and seek immediate care sooner if you develop any concerns.   Roberta Provencal, MD, PGY-3 [facility] 9:44 AM 11/21/2024  [facility]

## 2024-11-21 NOTE — Progress Notes (Signed)
    SUBJECTIVE:   CHIEF COMPLAINT / HPI: Hospital follow-up  Discussed the use of AI scribe software for clinical note transcription with the patient, who gave verbal consent to proceed.  History of Present Illness Roberta [name] is a 68 year old female with asthma who presents with worsening respiratory symptoms.  Respiratory symptoms and asthma exacerbation - Worsening respiratory symptoms requiring EMS transport and eight-day hospitalization due to upper respiratory infection. - Discharged with new inhalers, nebulizer machine, and nebulizer medication. - Currently on a prednisone  taper and uses albuterol  as needed. - Nocturnal symptoms with worsening when lying down, causing awakening with wheezing; uses liquid nebulizer with albuterol  at night. - Avoids inhaled steroids due to previous throat infection from budesonide. - Asthma exacerbations are seasonal, typically during seasonal changes or with upper respiratory infections. - This episode is the most severe exacerbation experienced. - Usual albuterol  inhaler use is approximately four times per year during seasonal changes.  Cough and sputum production - Cough with yellow sputum, now transitioned to white and less thick sputum. - Has not taken Mucinex  due to concerns about interaction with prednisone . - Inquires about safety of taking Mucinex  while on prednisone .  Glycemic control - A1c was 6.4 in May, now increased to 7.0. - Blood glucose spiked to 175 in the hospital after a meal, normalized before discharge. - Not currently on diabetes medication.    PERTINENT  PMH / PSH: Asthma, hypertension, hyperlipidemia, obesity, GERD, recurrent DVT on low-dose Eliquis   OBJECTIVE:   BP 125/75   Pulse 64   Ht 5' 6 (1.676 m)   Wt 246 lb 4 oz (111.7 kg)   SpO2 97%   BMI 39.75 kg/m   Physical Exam General: NAD, well appearing Neuro: A&O Cardiovascular: RRR, no murmurs,  Respiratory: normal WOB on RA, CTAB, no wheezes,  ronchi or rales, some cough Extremities: Moving all 4 extremities equally, no peripheral edema   ASSESSMENT/PLAN:   Assessment & Plan Mild intermittent asthma without complication Hospital follow-up for asthma exacerbation. Continuing to improve, reassuring lung exam today. - Continue steroid taper - Continue prn albuterol  - Consider reintroducing maintenance steroid if attacks become more frequent Diabetes mellitus without complication (HCC) A1c increased to 7.0.  - Start Ozempic  2.5mg  weekly  Precepted with Dr. Delores  Return in about 3 months (around 02/19/2025).  Roberta Provencal, MD, PGY-3 [facility] 12:24 PM 11/21/2024  [facility]

## 2024-11-21 NOTE — Assessment & Plan Note (Signed)
 A1c increased to 7.0.  - Start Ozempic  2.5mg  weekly

## 2024-11-22 ENCOUNTER — Telehealth: Payer: Self-pay

## 2024-11-22 ENCOUNTER — Encounter: Payer: Self-pay | Admitting: Family Medicine

## 2024-11-22 ENCOUNTER — Other Ambulatory Visit (HOSPITAL_COMMUNITY): Payer: Self-pay

## 2024-11-22 NOTE — Telephone Encounter (Signed)
 Prior authorization submitted for OZEMPIC  0.25/0.5MG  to [facility] via Latent.   Key: [id number]

## 2024-11-23 NOTE — Telephone Encounter (Signed)
 Pharmacy Patient Advocate Encounter  Received notification from [employer] that Prior Authorization for OZEMPIC  0.25/0.5MG  has been APPROVED from 11/22/24 to 12/19/25   PA #/Case ID/Reference #: [id number]

## 2024-11-27 ENCOUNTER — Other Ambulatory Visit: Payer: Self-pay

## 2024-11-27 ENCOUNTER — Telehealth: Payer: Self-pay

## 2024-11-27 DIAGNOSIS — E119 Type 2 diabetes mellitus without complications: Secondary | ICD-10-CM

## 2024-11-27 NOTE — Patient Instructions (Signed)
 Visit Information  Thank you for taking time to visit with me today. Please don't hesitate to contact me if I can be of assistance to you before our next scheduled telephone appointment.  Our next appointment is by telephone on 12/04/24 at 2pm  Following is a copy of your care plan:   Goals Addressed             This Visit's Progress    VBCI Transitions of Care (TOC) Care Plan   On track    Reviewed on 11/27/24 TOC week #3 outreach call with patient:   Problems:  Recent Hospitalization for treatment of Asthma, HTN, and History of DVT on Eliquis  Knowledge Deficit Related to Asthma Exacerbation, self care for HTN and DVT prevention/knowledge.   Goal:  Over the next 30 days, the patient will not experience hospital readmission  Interventions:   Asthma: Provided patient with basic written and verbal Asthma education on self care/management/and exacerbation prevention Advised patient to track and manage Asthma triggers Provided instruction about proper use of medications used for management of Asthma including inhalers and prednisone .  Discussed potential for increase in blood sugar while on tapering steroids.  Per PCP notes A1C 7.1 and patient will start on Ozempic  with follow up in 3 months Will complete prednisone  taper on 11/30/24 but is on daily dose as well  Will ask about CBG or glucometer supplies on next visit.  Has scale at home.  Reviewed tapering schedule with patient and she has a written plan to help due to dyslexia.  Advised patient to self assesses Asthma action plan zone and make appointment with provider if in the yellow zone for 48 hours without improvement. Discussed/review triggers: for patient it is seasonal weather changes.  Patient has action plan and used teach back to review actions she takes when inhalers and nebulizer do not provider relief.  Encouraged to place phone numbers and action plan where family members or friends can see in an emergency and  rationale.  Provided education about and advised patient to utilize infection prevention strategies to reduce risk of respiratory infection. Patient to start wearing mask in cold weather and ear muffs due to crystals in ears triggering symptoms.  12/9/25[name] Sous out today for some errands, wore a mask, and stated she did well, much better than last week.  Discussed the importance of adequate rest and management of fatigue with Asthma:  Patient has good self care management on review.  Screening for signs and symptoms of depression related to chronic disease state:  Completed.  Assessed social determinant of health barriers:  Completed.  Directed patient to website for printable Action Plan by [employer] by DHHS and NIH at [email or web address] and discuss with PCP for any changes needed to patient's needs/plan.   Generic and DVT Interventions:  Evaluation of current treatment plan related to Asthma, HTN, and DVT prevention,  self-management and patient's adherence to plan as established by provider. Discussed plans with patient for ongoing care management follow up and provided patient with direct contact information for care management team Evaluation of current treatment plan related to Asthma exacerbation and patient's adherence to plan as established by provider Advised patient to provide appropriate vaccination information to provider or care management team member at next visit Provided education to patient re: Care Gaps to discuss with PCP, including vaccinations as patient allergic to flu vaccine, education on Shingles vaccine with history of chickenpox as a child to review with PCP.  Reviewed medications with patient and discussed  Precaution to watch for on Eliquis , reviewed all medications and inhalers/nebulizers.  Reviewed scheduled/upcoming provider appointments including PCP appointment in 11/21/24.  Discussed plans with patient for ongoing care  management follow up and provided patient with direct contact information for care management team Advised patient to discuss Care Gaps noted and discussed with provider including annual wellness visit and labwork while in Eliquis  PCP follows every 3 months.  Discussed prior DVT's due to long travel one time and dehydration due to flu another time.  Reviewed preventative measures, Teach back on signs and symptoms of DVT and what to call provider for.  Eliquis  medication review with teach back.   Hypertension Interventions: Discussed starting to keep a log to record blood pressures at home with equipment in place for capturing blood pressure trends to take to providers.  Last practice recorded BP readings:  PCP office visit 125/75 Patient has equipment to check blood pressure but only does when she feels it is too low or too high, I can just tell.  Re-visited taking a few times a week and keeping a log again on 11/27/24.  BP Readings from Last 3 Encounters:  11/18/24 136/80  08/23/24 129/78  08/17/24 (!) 146/88   Most recent eGFR/CrCl:  Lab Results  Component Value Date   EGFR 61 01/09/2024    No components found for: CRCL  Evaluation of current treatment plan related to hypertension self management and patient's adherence to plan as established by provider Reviewed medications with patient and discussed importance of compliance Discussed plans with patient for ongoing care management follow up and provided patient with direct contact information for care management team Advised patient, providing education and rationale, to monitor blood pressure daily and record, calling PCP for findings outside established parameters Provided education on prescribed diet Low sodium/cardiac/heart healthy.  Screening for signs and symptoms of depression related to chronic disease state  Assessed social determinant of health barriers  Patient Self Care Activities:  Attend all scheduled provider  appointments Call pharmacy for medication refills 3-7 days in advance of running out of medications Call provider office for new concerns or questions  Participate in Transition of Care Program/Attend TOC scheduled calls Take medications as prescribed   check blood pressure 3 times per week choose a place to take my blood pressure (home, clinic or office, retail store) write blood pressure results in a log or diary keep a blood pressure log take blood pressure log to all doctor appointments keep all doctor appointments take medications for blood pressure exactly as prescribed eat more whole grains, fruits and vegetables, lean meats and healthy fats Patient has SpO2 sensor and blood pressure monitor at home  Plan:  An initial telephone outreach has been scheduled for:  Next PCP appointment scheduled for: Tuesday 12/04/24 at 2 pm by telephone.         Patient verbalizes understanding of instructions and care plan provided today and agrees to view in MyChart. Active MyChart status and patient understanding of how to access instructions and care plan via MyChart confirmed with patient.     Telephone follow up appointment with care management team member scheduled for: Our next appointment is by telephone on 12/04/24 at 2pm  Please call the care guide team at [phone] if you need to cancel or reschedule your appointment.   Please call the [place]  National Suicide Prevention Lifeline: (740)030-0355 or TTY: 6620893248 TTY 4697196881) to talk to a trained counselor call 1-800-273-TALK (toll free, 24 hour hotline) if you are experiencing  a Mental Health or Behavioral Health Crisis or need someone to talk to.   Bing Edison MSN, RN RN [employer]  VBCI-Population Health Office Hours M-F [phone] Direct Dial: [phone] Main Phone [phone]  Fax: [phone] [email or web address]

## 2024-11-27 NOTE — Transitions of Care (Post Inpatient/ED Visit) (Signed)
 Transition of Care week 2  Visit Note  11/27/2024  Name: Roberta [name] MRN: [id number]          DOB: 01-Jan-1956  Situation: Patient enrolled in [facility] 30-day program. Visit completed with patient by telephone.   Background: Patient states she is doing much better this week and feeling much better.   Initial Transition Care Management Follow-up Telephone Call Discharge Date and Diagnosis: 11/18/24, Asthma exacerbation   Past Medical History:  Diagnosis Date   Abnormal EKG    nonspecific ST and T-wave changes - Followed by Dr.Berry   ALLERGIC RHINITIS 10/14/2010   Anemia    Asthma    ASTHMA, INTERMITTENT 02/16/2007   Atypical endometrial hyperplasia 07/28/2015   BACK PAIN, CHRONIC 03/25/2009   CERVICAL RADICULOPATHY 10/16/2009   DDD (degenerative disc disease), lumbosacral    Depression    DEPRESSIVE DISORDER NOT ELSEWHERE CLASSIFIED 03/25/2009   Endometrial cancer (HCC)    GERD (gastroesophageal reflux disease) 09/09/2011   Glaucoma    sees optho every 3 months, drops each night   Hyperlipidemia    Hypertension    Localized swelling, mass or lump of neck 10/26/2018   PONV (postoperative nausea and vomiting)    Postmenopausal vaginal bleeding 09/24/2014   Viral URI with cough 03/28/2013    Assessment: Patient Reported Symptoms: Cognitive Cognitive Status: No symptoms reported, Insightful and able to interpret abstract concepts, Normal speech and language skills, Alert and oriented to person, place, and time      Neurological Neurological Review of Symptoms: No symptoms reported Neurological Self-Management Outcome: 4 (good) Neurological Comment: No change from before.  HEENT HEENT Symptoms Reported: Other: (Stuffiness)      Cardiovascular Cardiovascular Symptoms Reported: No symptoms reported Does patient have uncontrolled Hypertension?: No Cardiovascular Comment: Denies cheat pain or discomfort. Has a scale at home to use, and a blood pressure cuff. Last BP in  office was  125/75 per patient stated. Discussed starting to keep a log of blood pressures and weights to take to providers.  Respiratory Respiratory Symptoms Reported: No symptoms reported Other Respiratory Symptoms: Can walk short distances now without getting short of breath or wheezing. Using nebulizer and inhalers as prescribed or as needed. Stated she is doing much better than last week and even went out with a mask on today to run errands. Respiratory Management Strategies: Activity, Adequate rest, Coping strategies, Medication therapy, Asthma action plan, Routine screening Respiratory Self-Management Outcome: 4 (good)  Endocrine Endocrine Symptoms Reported: No symptoms reported Is patient diabetic?:  (A1C 7.1, PCP started on Ozempic  with follow up in 3 months before deciding on medications, also dietary discussed.) Endocrine Self-Management Outcome: 3 (uncertain)  Gastrointestinal Gastrointestinal Symptoms Reported: No symptoms reported Additional Gastrointestinal Details: Takes prune just as needed. 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 Musculoskeletal Management Strategies: Activity, Coping strategies, Medication therapy, Routine screening, Weight management Musculoskeletal Self-Management Outcome: 4 (good) Falls in the past year?: No    Psychosocial Psychosocial Symptoms Reported: No symptoms reported  There were no vitals filed for this visit. Pain Scale: 0-10 Pain Score: 0-No pain  Medications Reviewed Today     Reviewed by Carolee Heron NOVAK, RN (Case Manager) on 11/27/24 at 1423  Med List Status: <None>   Medication Order Taking? Sig Documenting Provider Last Dose Status Informant  albuterol  (PROVENTIL ) (2.5 MG/3ML) 0.083% nebulizer solution [id number] Yes Take 3 mLs (2.5 mg total) by nebulization every 6 (six) hours as needed for wheezing or shortness of breath. Arlon Carliss ORN, DO  Active   albuterol  (VENTOLIN  HFA) [phone] Base) MCG/ACT inhaler [id number] Yes Inhale 2 puffs into the lungs every 6 (six) hours as needed for wheezing or shortness of breath. Arlon Carliss ORN, DO  Active   apixaban  (ELIQUIS ) 2.5 MG TABS tablet [id number] Yes Take 1 tablet (2.5 mg total) by mouth 2 (two) times daily. [name] ONEIDA MADISON, MD  Active Self, Pharmacy Records  BIOTIN PO [id number] Yes Take 1 capsule by mouth daily. [provider]  Active Self, Pharmacy Records  cetirizine  (ZYRTEC ) 10 MG tablet [id number] Yes TAKE 1 TABLET(10 MG) BY MOUTH DAILY  Patient taking differently: Take 10 mg by mouth daily as needed for allergies. Only take if going outside or if nose is clogged.   [name], DO  Active Self, Pharmacy Records  cyclobenzaprine  (FLEXERIL ) 10 MG tablet [id number] Yes Take 10 mg by mouth daily as needed for muscle spasms. [provider]  Active Self, Pharmacy Records           Med Note [name], [name]   Fri Nov 16, 2024  5:49 PM)    dorzolamide -timolol  (COSOPT ) 22.3-6.8 MG/ML ophthalmic solution [id number] Yes Place 1 drop into both eyes 2 (two) times daily.  Patient taking differently: Place 1 drop into both eyes 2 (two) times daily. 8am & 8pm   Rennie Toribio CROME, MD  Active Self, Pharmacy Records  esomeprazole  (NEXIUM ) 20 MG capsule [id number] Yes TAKE 1 CAPSULE(20 MG) BY MOUTH DAILY AS NEEDED FOR STOMACH ACID OR REFLUX [name], MD  Active Self, Pharmacy Records   meclizine  (ANTIVERT ) 12.5 MG tablet [id number] Yes TAKE 1 TABLET(12.5 MG) BY MOUTH THREE TIMES DAILY AS NEEDED [name], DO  Active Self, Pharmacy Records           Med Note SYDELL HERON NOVAK Pablo Nov 19, 2024 12:40 PM) Emily as needed.   Multiple Vitamins-Minerals (EMERGEN-C IMMUNE PO) [id number] Yes Take 1 packet by mouth as needed. [provider]  Active Self, Pharmacy Records  olmesartan -hydrochlorothiazide  (BENICAR  HCT) 20-12.5 MG tablet [id number] Yes TAKE 1 TABLET BY MOUTH DAILY Quillen, Michael, MD  Active Self, Pharmacy Records  ondansetron  (ZOFRAN ) 4 MG tablet [id number] Yes TAKE 1 TABLET BY MOUTH EVERY 8 HOURS AS NEEDED FOR NAUSEA OR VOMITING [name], DO  Active Self, Pharmacy Records  predniSONE  (DELTASONE ) 10 MG tablet [id number] Yes Take 4 tablets (40 mg total) by mouth daily for 3 days, THEN 3 tablets (30 mg total) daily for 3 days, THEN 2 tablets (20 mg total) daily for 3 days, THEN 1 tablet (10 mg total) daily for 3 days. Arlon Carliss ORN, DO  Active   rosuvastatin  (CRESTOR ) 10 MG tablet [id number] Yes Take 1 tablet (10 mg total) by mouth daily. [name], MD  Active Self, Pharmacy Records  Semaglutide ,0.25 or 0.5MG /DOS, 2 MG/1.5ML NELMA [id number] Yes Inject 0.25 mg into the skin once a week. 0.25 mg once weekly for 4 weeks then increase to 0.5 mg weekly for at  least 4 weeks,max 1 mg  Patient taking differently: Inject 0.25 mg into the skin once a week. 0.25 mg once weekly for 4 weeks then increase to 0.5 mg weekly for at least 4 weeks,max 1 mg   Quillen, Michael, MD  Active   traMADol  (ULTRAM ) 50 MG tablet [id number] Yes Take 1 tablet (50 mg total) by mouth every 6 (six) hours as needed. [name], MD  Active Self, Pharmacy Records  Travoprost , BAK Free, (TRAVATAN ) 0.004 % SOLN ophthalmic solution [id number] Yes Place 1 drop into both eyes at bedtime. [name], MD  Active Self, Pharmacy Records  Zoster Vaccine Adjuvanted (SHINGRIX ) injection [id number]  Yes Administer Shingrix  vaccination now and repeat in two months [name], MD  Active             Goals Addressed             This Visit's Progress    VBCI Transitions of Care (TOC) Care Plan   On track    Reviewed on 11/27/24 TOC week #3 outreach call with patient:   Problems:  Recent Hospitalization for treatment of Asthma, HTN, and History of DVT on Eliquis  Knowledge Deficit Related to Asthma Exacerbation, self care for HTN and DVT prevention/knowledge.   Goal:  Over the next 30 days, the patient will not experience hospital readmission  Interventions:   Asthma: Provided patient with basic written and verbal Asthma education on self care/management/and exacerbation prevention Advised patient to track and manage Asthma triggers Provided instruction about proper use of medications used for management of Asthma including inhalers and prednisone .  Discussed potential for increase in blood sugar while on tapering steroids.  Per PCP notes A1C 7.1 and patient will start on Ozempic  with follow up in 3 months Will complete prednisone  taper on 11/30/24 but is on daily dose as well  Will ask about CBG or glucometer supplies on next visit.  Has scale at home.  Reviewed tapering schedule with patient and she has a written plan to help due to dyslexia.  Advised patient to self assesses Asthma action plan zone and make appointment with provider if in the yellow zone for 48 hours without improvement. Discussed/review triggers: for patient it is seasonal weather changes.  Patient has action plan and used teach back to review actions she takes when inhalers and nebulizer do not provider relief.  Encouraged to place phone numbers and action plan where family members or friends can see in an emergency and rationale.  Provided education about and advised patient to utilize infection prevention strategies to reduce risk of respiratory infection. Patient to start wearing mask in cold  weather and ear muffs due to crystals in ears triggering symptoms.  12/9/25[name] Sous out today for some errands, wore a mask, and stated she did well, much better than last week.  Discussed the importance of adequate rest and management of fatigue with Asthma:  Patient has good self care management on review.  Screening for signs and symptoms of depression related to chronic disease state:  Completed.  Assessed social determinant of health barriers:  Completed.  Directed patient to website for printable Action Plan by [employer] by DHHS and NIH at [employer] and discuss with PCP for any changes needed to patient's needs/plan.   Generic and DVT Interventions:  Evaluation of current treatment plan related to Asthma, HTN, and DVT prevention,  self-management and patient's adherence to plan as established by provider. Discussed plans with patient for ongoing care management follow up and provided  patient with direct contact information for care management team Evaluation of current treatment plan related to Asthma exacerbation and patient's adherence to plan as established by provider Advised patient to provide appropriate vaccination information to provider or care management team member at next visit Provided education to patient re: Care Gaps to discuss with PCP, including vaccinations as patient allergic to flu vaccine, education on Shingles vaccine with history of chickenpox as a child to review with PCP.  Reviewed medications with patient and discussed Precaution to watch for on Eliquis , reviewed all medications and inhalers/nebulizers.  Reviewed scheduled/upcoming provider appointments including PCP appointment in 11/21/24.  Discussed plans with patient for ongoing care management follow up and provided patient with direct contact information for care management team Advised patient to discuss Care Gaps noted and discussed with provider including  annual wellness visit and labwork while in Eliquis  PCP follows every 3 months.  Discussed prior DVT's due to long travel one time and dehydration due to flu another time.  Reviewed preventative measures, Teach back on signs and symptoms of DVT and what to call provider for.  Eliquis  medication review with teach back.   Hypertension Interventions: Discussed starting to keep a log to record blood pressures at home with equipment in place for capturing blood pressure trends to take to providers.  Last practice recorded BP readings:  PCP office visit 125/75 Patient has equipment to check blood pressure but only does when she feels it is too low or too high, I can just tell.  Re-visited taking a few times a week and keeping a log again on 11/27/24.  BP Readings from Last 3 Encounters:  11/18/24 136/80  08/23/24 129/78  08/17/24 (!) 146/88   Most recent eGFR/CrCl:  Lab Results  Component Value Date   EGFR 61 01/09/2024    No components found for: CRCL  Evaluation of current treatment plan related to hypertension self management and patient's adherence to plan as established by provider Reviewed medications with patient and discussed importance of compliance Discussed plans with patient for ongoing care management follow up and provided patient with direct contact information for care management team Advised patient, providing education and rationale, to monitor blood pressure daily and record, calling PCP for findings outside established parameters Provided education on prescribed diet Low sodium/cardiac/heart healthy.  Screening for signs and symptoms of depression related to chronic disease state  Assessed social determinant of health barriers  Patient Self Care Activities:  Attend all scheduled provider appointments Call pharmacy for medication refills 3-7 days in advance of running out of medications Call provider office for new concerns or questions  Participate in Transition of  Care Program/Attend TOC scheduled calls Take medications as prescribed   check blood pressure 3 times per week choose a place to take my blood pressure (home, clinic or office, retail store) write blood pressure results in a log or diary keep a blood pressure log take blood pressure log to all doctor appointments keep all doctor appointments take medications for blood pressure exactly as prescribed eat more whole grains, fruits and vegetables, lean meats and healthy fats Patient has SpO2 sensor and blood pressure monitor at home  Plan:  An initial telephone outreach has been scheduled for:  Next PCP appointment scheduled for: Tuesday 12/04/24 at 2 pm by telephone.           11/27/24: Successful TOC week #2 outreach with patient via phone call. Reviewed Care Gaps and patient aware of upcoming 01/2025 events.  Declines Covid  and flue due to an allergic reaction to flu vaccine.  Feeling much better patient stated, no concerns or issues on this call The patient has been provided with contact information for the care management team and has been advised to call with any health-related questions or concerns. The patient verbalized understanding with current POC.  The patient is directed to their insurance card regarding availability of benefits coverage      Recommendation:   Continue Current Plan of Care  Follow Up Plan:   Telephone follow-up in 1 week; Tuesday 12/04/24 at 2 pm by telephone.    Bing Edison MSN, RN RN [employer]  VBCI-Population Health Office Hours M-F [phone] Direct Dial: [phone] Main Phone [phone]  Fax: [phone] [email or web address]

## 2024-12-04 ENCOUNTER — Other Ambulatory Visit: Payer: Self-pay

## 2024-12-04 ENCOUNTER — Telehealth: Payer: Self-pay

## 2024-12-04 NOTE — Transitions of Care (Post Inpatient/ED Visit) (Addendum)
 Transition of Care week 3  Visit Note  12/04/2024  Name: Roberta [name] MRN: [id number]          DOB: 21-Aug-1956  Situation: Patient enrolled in [facility] 30-day program. Visit completed with patient by telephone.   Background:   Initial Transition Care Management Follow-up Telephone Call Discharge Date and Diagnosis: 11/18/24, Asthma exacerbation   Past Medical History:  Diagnosis Date   Abnormal EKG    nonspecific ST and T-wave changes - Followed by Dr.Berry   ALLERGIC RHINITIS 10/14/2010   Anemia    Asthma    ASTHMA, INTERMITTENT 02/16/2007   Atypical endometrial hyperplasia 07/28/2015   BACK PAIN, CHRONIC 03/25/2009   CERVICAL RADICULOPATHY 10/16/2009   DDD (degenerative disc disease), lumbosacral    Depression    DEPRESSIVE DISORDER NOT ELSEWHERE CLASSIFIED 03/25/2009   Endometrial cancer (HCC)    GERD (gastroesophageal reflux disease) 09/09/2011   Glaucoma    sees optho every 3 months, drops each night   Hyperlipidemia    Hypertension    Localized swelling, mass or lump of neck 10/26/2018   PONV (postoperative nausea and vomiting)    Postmenopausal vaginal bleeding 09/24/2014   Viral URI with cough 03/28/2013    Assessment: Patient Reported Symptoms: Cognitive Cognitive Status: No symptoms reported, Alert and oriented to person, place, and time, Insightful and able to interpret abstract concepts, Normal speech and language skills      Neurological Neurological Review of Symptoms: No symptoms reported    HEENT HEENT Symptoms Reported: No symptoms reported      Cardiovascular Cardiovascular Symptoms Reported: No symptoms reported Does patient have uncontrolled Hypertension?: No Cardiovascular Management Strategies: Coping strategies, Activity, Medical device, Medication therapy, Routine screening, Weight management Do You Have a Working Readable Scale?: Yes Cardiovascular Comment: Denies chest pain or discomfort. Has a scale at home to use, and a blood  pressure cuff. Last BP in office was 125/75 per patient stated. Discussed starting to keep a log of blood pressures and weights to take to providers. Blood pressure taken today reported as 124/78 HR 78 per patient.  Respiratory Other Respiratory Symptoms: Coninues to improve and walking a bit without wheezing or shortness of breath, still a bit stuffy she states but improving. Respiratory Management Strategies: Activity, Adequate rest, Asthma action plan, Coping strategies, Exercise, Medication therapy, Routine screening Respiratory Self-Management Outcome: 4 (good)  Endocrine Endocrine Symptoms Reported: No symptoms reported Endocrine Comment: Delayed starting Ozempic  till 12/09/24 per patient. Has been reading on how to lower A1c and has begun eating more vegetables, watching carb intake per patient stated. (Last A1C 7.1)  Gastrointestinal Gastrointestinal Symptoms Reported: No symptoms reported      Genitourinary Genitourinary Symptoms Reported: No symptoms reported    Integumentary Integumentary Symptoms Reported: No symptoms reported    Musculoskeletal Musculoskelatal Symptoms Reviewed: Joint pain Musculoskeletal Management Strategies: Activity, Coping strategies, Exercise, Medical device, Medication therapy, Routine screening, Weight management Falls in the past year?: No    Psychosocial Psychosocial Symptoms Reported: No symptoms reported         Today's Vitals   12/04/24 1425  BP: 124/78  Pulse: 78   Pain Scale: 0-10 (No pain on call but reports shooting hip pain at night when laying on side. To [facility] regarding this due to diagnosis of degenerative disease.) Pain Score: 0-No pain  Medications Reviewed Today     Reviewed by Carolee Heron NOVAK, RN (Case Manager) on 12/04/24 at 1417  Med List Status: <None>  Medication Order Taking? Sig Documenting Provider Last Dose Status Informant  albuterol  (PROVENTIL ) (2.5 MG/3ML) 0.083% nebulizer  solution [id number] Yes Take 3 mLs (2.5 mg total) by nebulization every 6 (six) hours as needed for wheezing or shortness of breath. Arlon Carliss ORN, DO  Active   albuterol  (VENTOLIN  HFA) 108 [phone] Base) MCG/ACT inhaler [id number] Yes Inhale 2 puffs into the lungs every 6 (six) hours as needed for wheezing or shortness of breath. Arlon Carliss ORN, DO  Active   apixaban  (ELIQUIS ) 2.5 MG TABS tablet [id number] Yes Take 1 tablet (2.5 mg total) by mouth 2 (two) times daily. [name] ONEIDA MADISON, MD  Active Self, Pharmacy Records  BIOTIN PO [id number] Yes Take 1 capsule by mouth daily. [provider]  Active Self, Pharmacy Records  cetirizine  (ZYRTEC ) 10 MG tablet [id number] Yes TAKE 1 TABLET(10 MG) BY MOUTH DAILY  Patient taking differently: Take 10 mg by mouth daily as needed for allergies. Only take if going outside or if nose is clogged.   [name], DO  Active Self, Pharmacy Records  cyclobenzaprine  (FLEXERIL ) 10 MG tablet [id number] Yes Take 10 mg by mouth daily as needed for muscle spasms. [provider]  Active Self, Pharmacy Records           Med Note [name], [name]   Fri Nov 16, 2024  5:49 PM)    dorzolamide -timolol  (COSOPT ) 22.3-6.8 MG/ML ophthalmic solution [id number] Yes Place 1 drop into both eyes 2 (two) times daily.  Patient taking differently: Place 1 drop into both eyes 2 (two) times daily. 8am & 8pm   Rennie Toribio CROME, MD  Active Self, Pharmacy Records  esomeprazole  (NEXIUM ) 20 MG capsule [id number] Yes TAKE 1 CAPSULE(20 MG) BY MOUTH DAILY AS NEEDED FOR STOMACH ACID OR REFLUX [name], MD  Active Self, Pharmacy Records  meclizine  (ANTIVERT ) 12.5 MG tablet [id number] Yes TAKE 1 TABLET(12.5 MG) BY MOUTH THREE TIMES DAILY AS NEEDED [name], DO  Active Self, Pharmacy Records           Med Note SYDELL HERON KATHEE Pablo Nov 19, 2024 12:40 PM) Emily as needed.   Multiple Vitamins-Minerals (EMERGEN-C IMMUNE PO) [id number] Yes Take 1 packet by mouth as needed.  [provider]  Active Self, Pharmacy Records  olmesartan -hydrochlorothiazide  (BENICAR  HCT) 20-12.5 MG tablet [id number] Yes TAKE 1 TABLET BY MOUTH DAILY Quillen, Michael, MD  Active Self, Pharmacy Records  ondansetron  (ZOFRAN ) 4 MG tablet [id number] Yes TAKE 1 TABLET BY MOUTH EVERY 8 HOURS AS NEEDED FOR NAUSEA OR VOMITING [name], DO  Active Self, Pharmacy Records  rosuvastatin  (CRESTOR ) 10 MG tablet [id number] Yes Take 1 tablet (10 mg total) by mouth daily. [name], MD  Active Self, Pharmacy Records  Semaglutide ,0.25 or 0.5MG /DOS, 2 MG/1.5ML NELMA [id number] Yes Inject 0.25 mg into the skin once a week. 0.25 mg once weekly for 4 weeks then increase to 0.5 mg weekly for at least 4 weeks,max 1 mg Quillen, Michael, MD  Active   traMADol  (ULTRAM ) 50 MG tablet [id number] Yes Take 1 tablet (50 mg total) by mouth every 6 (six) hours as needed. [name], MD  Active Self, Pharmacy Records  Travoprost , BAK Free, (TRAVATAN ) 0.004 % SOLN ophthalmic solution [id number] Yes Place 1 drop into both eyes at bedtime. [name], MD  Active Self, Pharmacy Records  Zoster Vaccine Adjuvanted (SHINGRIX ) injection [id number]  Administer Shingrix  vaccination now and repeat in two months  Patient not taking: Reported on 12/04/2024   [name],  MD  Active             Goals Addressed             This Visit's Progress    VBCI Transitions of Care (TOC) Care Plan   On track    Reviewed on 12/04/24 TOC week #3 outreach call with patient:   Problems:  Recent Hospitalization for treatment of Asthma, HTN, and History of DVT on Eliquis  Knowledge Deficit Related to Asthma Exacerbation, self care for HTN and DVT prevention/knowledge.   Goal:  Over the next 30 days, the patient will not experience hospital readmission  Interventions:   Asthma: Provided patient with basic written and verbal Asthma education on self care/management/and exacerbation prevention Advised patient to  track and manage Asthma triggers: seasonal or cold weather.  Provided instruction about proper use of medications used for management of Asthma including inhalers and prednisone .  Discussed potential for increase in blood sugar while on tapering steroids.  Per PCP notes A1C 7.1 and patient will start on Ozempic  12/09/24 with follow up in 3 months. Complete prednisone  taper on 11/30/24 but is on daily dose as well  Will ask about CBG or glucometer supplies on next PCP visit.  Has scale at home.  Reviewed tapering schedule with patient and she has a written plan to help due to dyslexia. (Completed on 11/30/24) Reviewed: patient to self assesses Asthma action plan zone and make appointment with provider if in the yellow zone for 48 hours without improvement. Discussed/review triggers: for patient it is seasonal weather changes.  Patient has action plan and used teach back to review actions she takes when inhalers and nebulizer do not provider relief.  Encouraged to place phone numbers and action plan where family members or friends can see in an emergency and rationale.  Nephew has unlock code to her phone and instructed on action plan and how to call 911.  Provided education about and advised patient to utilize infection prevention strategies to reduce risk of respiratory infection. Patient to start wearing mask in cold weather and ear muffs due to crystals in ears triggering symptoms.  12/9/25[name] Sous out today for some errands, wore a mask, and stated she did well, much better than last week.  12/04/24: Continues to improve with activity and precautions without an episode.  Discussed the importance of adequate rest and management of fatigue with Asthma:  Patient has good self care management on review.  Screening for signs and symptoms of depression related to chronic disease state:  Completed.  Assessed social determinant of health barriers:  Completed.  Directed patient to website for  printable Action Plan by [employer] by DHHS and NIH at [email or web address] and discuss with PCP for any changes needed to patient's needs/plan.   Generic and DVT Interventions:  Evaluation of current treatment plan related to Asthma, HTN, and DVT prevention,  self-management and patient's adherence to plan as established by provider. Discussed plans with patient for ongoing care management follow up and provided patient with direct contact information for care management team Evaluation of current treatment plan related to Asthma exacerbation and patient's adherence to plan as established by provider Advised patient to provide appropriate vaccination information to provider or care management team member at next visit Provided education to patient re: Care Gaps to discuss with PCP, including vaccinations as patient allergic to flu vaccine, education on Shingles vaccine with history of chickenpox as a child to review with PCP.  Reviewed medications with patient and discussed Precaution  to watch for on Eliquis , reviewed all medications and inhalers/nebulizers.  Reviewed scheduled/upcoming provider appointments including PCP appointment in 11/21/24.  Discussed plans with patient for ongoing care management follow up and provided patient with direct contact information for care management team Advised patient to discuss Care Gaps noted and discussed with provider including annual wellness visit and labwork while in Eliquis  PCP follows every 3 months.  Discussed prior DVT's due to long travel one time and dehydration due to flu another time.  Reviewed preventative measures, Teach back on signs and symptoms of DVT and what to call provider for.  Eliquis  medication review with teach back.  12/04/24: Denies any falls, bleeding, or bruising.   Hypertension Interventions: Discussed starting to keep a log to record blood pressures at home with equipment in place for  capturing blood pressure trends to take to providers.  Last practice recorded BP readings:  PCP office visit 125/75 Patient has equipment to check blood pressure but only does when she feels it is too low or too high, I can just tell.  Re-visited taking a few times a week and keeping a log again on 11/27/24.  Today: 124/78 HR 78 while on TOC call.  BP Readings from Last 3 Encounters:  11/18/24 136/80  08/23/24 129/78  08/17/24 (!) 146/88   Most recent eGFR/CrCl:  Lab Results  Component Value Date   EGFR 61 01/09/2024    No components found for: CRCL  Evaluation of current treatment plan related to hypertension self management and patient's adherence to plan as established by provider Reviewed medications with patient and discussed importance of compliance Discussed plans with patient for ongoing care management follow up and provided patient with direct contact information for care management team Advised patient, providing education and rationale, to monitor blood pressure daily and record, calling PCP for findings outside established parameters Provided education on prescribed diet Low sodium/cardiac/heart healthy.  Patient has started eating Healthier and adding more vegetables and less carbohydrates.  Screening for signs and symptoms of depression related to chronic disease state  Assessed social determinant of health barriers  Patient Self Care Activities:  Attend all scheduled provider appointments Call pharmacy for medication refills 3-7 days in advance of running out of medications Call provider office for new concerns or questions  Participate in Transition of Care Program/Attend TOC scheduled calls Take medications as prescribed   check blood pressure 3 times per week choose a place to take my blood pressure (home, clinic or office, retail store) write blood pressure results in a log or diary keep a blood pressure log take blood pressure log to all doctor  appointments keep all doctor appointments take medications for blood pressure exactly as prescribed eat more whole grains, fruits and vegetables, lean meats and healthy fats Patient has SpO2 sensor and blood pressure monitor at home Continue with diet modifications patient has initiated:  More vegetables and less carbs in diet.  Has been reading up on how to lower her A1c (7.1) and is motivated to change habits,  Start Ozempic  on 12/09/24.   Plan:  Next TOC follow up call: Tuesday 12/11/24 at 2 pm by telephone.           12/04/24:  Patient to request referral from PCP to an Asthma specialist for follow up. Patient is doing well with self care management and motivated to reduce A1C by diet, exercise, and medication. Blood pressure stable today on call. Patient education information on A1C, pre diabetes eating and prevention added  to AVS summary.   Recommendation:   Continue Current Plan of Care  Follow Up Plan:   Telephone follow-up in 1 week on Tuesday 12/11/24 at 2 pm by telephone.    Bing Edison MSN, RN RN [employer] Sales [employer]  VBCI-Population Health Office Hours M-F [phone] Direct Dial: [phone] Main Phone [phone]  Fax: [phone] [email or web address]

## 2024-12-04 NOTE — Patient Instructions (Signed)
 Visit Information  Thank you for taking time to visit with me today. Please don't hesitate to contact me if I can be of assistance to you before our next scheduled telephone appointment.  Our next appointment is by telephone on 12/11/24  at 2 pm  Following is a copy of your care plan:   Goals Addressed             This Visit's Progress    VBCI Transitions of Care (TOC) Care Plan   On track    Reviewed on 12/04/24 TOC week #3 outreach call with patient:   Problems:  Recent Hospitalization for treatment of Asthma, HTN, and History of DVT on Eliquis  Knowledge Deficit Related to Asthma Exacerbation, self care for HTN and DVT prevention/knowledge.   Goal:  Over the next 30 days, the patient will not experience hospital readmission  Interventions:   Asthma: Provided patient with basic written and verbal Asthma education on self care/management/and exacerbation prevention Advised patient to track and manage Asthma triggers: seasonal or cold weather.  Provided instruction about proper use of medications used for management of Asthma including inhalers and prednisone .  Discussed potential for increase in blood sugar while on tapering steroids.  Per PCP notes A1C 7.1 and patient will start on Ozempic  12/09/24 with follow up in 3 months. Complete prednisone  taper on 11/30/24 but is on daily dose as well  Will ask about CBG or glucometer supplies on next PCP visit.  Has scale at home.  Reviewed tapering schedule with patient and she has a written plan to help due to dyslexia. (Completed on 11/30/24) Reviewed: patient to self assesses Asthma action plan zone and make appointment with provider if in the yellow zone for 48 hours without improvement. Discussed/review triggers: for patient it is seasonal weather changes.  Patient has action plan and used teach back to review actions she takes when inhalers and nebulizer do not provider relief.  Encouraged to place phone numbers and action plan  where family members or friends can see in an emergency and rationale.  Nephew has unlock code to her phone and instructed on action plan and how to call 911.  Provided education about and advised patient to utilize infection prevention strategies to reduce risk of respiratory infection. Patient to start wearing mask in cold weather and ear muffs due to crystals in ears triggering symptoms.  12/9/25[name] Sous out today for some errands, wore a mask, and stated she did well, much better than last week.  12/04/24: Continues to improve with activity and precautions without an episode.  Discussed the importance of adequate rest and management of fatigue with Asthma:  Patient has good self care management on review.  Screening for signs and symptoms of depression related to chronic disease state:  Completed.  Assessed social determinant of health barriers:  Completed.  Directed patient to website for printable Action Plan by [employer] by DHHS and NIH at [email or web address] and discuss with PCP for any changes needed to patient's needs/plan.   Generic and DVT Interventions:  Evaluation of current treatment plan related to Asthma, HTN, and DVT prevention,  self-management and patient's adherence to plan as established by provider. Discussed plans with patient for ongoing care management follow up and provided patient with direct contact information for care management team Evaluation of current treatment plan related to Asthma exacerbation and patient's adherence to plan as established by provider Advised patient to provide appropriate vaccination information to provider or care management team member at next visit Provided  education to patient re: Care Gaps to discuss with PCP, including vaccinations as patient allergic to flu vaccine, education on Shingles vaccine with history of chickenpox as a child to review with PCP.  Reviewed medications with patient and  discussed Precaution to watch for on Eliquis , reviewed all medications and inhalers/nebulizers.  Reviewed scheduled/upcoming provider appointments including PCP appointment in 11/21/24.  Discussed plans with patient for ongoing care management follow up and provided patient with direct contact information for care management team Advised patient to discuss Care Gaps noted and discussed with provider including annual wellness visit and labwork while in Eliquis  PCP follows every 3 months.  Discussed prior DVT's due to long travel one time and dehydration due to flu another time.  Reviewed preventative measures, Teach back on signs and symptoms of DVT and what to call provider for.  Eliquis  medication review with teach back.  12/04/24: Denies any falls, bleeding, or bruising.   Hypertension Interventions: Discussed starting to keep a log to record blood pressures at home with equipment in place for capturing blood pressure trends to take to providers.  Last practice recorded BP readings:  PCP office visit 125/75 Patient has equipment to check blood pressure but only does when she feels it is too low or too high, I can just tell.  Re-visited taking a few times a week and keeping a log again on 11/27/24.  Today: 124/78 HR 78 while on TOC call.  BP Readings from Last 3 Encounters:  11/18/24 136/80  08/23/24 129/78  08/17/24 (!) 146/88   Most recent eGFR/CrCl:  Lab Results  Component Value Date   EGFR 61 01/09/2024    No components found for: CRCL  Evaluation of current treatment plan related to hypertension self management and patient's adherence to plan as established by provider Reviewed medications with patient and discussed importance of compliance Discussed plans with patient for ongoing care management follow up and provided patient with direct contact information for care management team Advised patient, providing education and rationale, to monitor blood pressure daily and record,  calling PCP for findings outside established parameters Provided education on prescribed diet Low sodium/cardiac/heart healthy.  Patient has started eating Healthier and adding more vegetables and less carbohydrates.  Screening for signs and symptoms of depression related to chronic disease state  Assessed social determinant of health barriers  Patient Self Care Activities:  Attend all scheduled provider appointments Call pharmacy for medication refills 3-7 days in advance of running out of medications Call provider office for new concerns or questions  Participate in Transition of Care Program/Attend TOC scheduled calls Take medications as prescribed   check blood pressure 3 times per week choose a place to take my blood pressure (home, clinic or office, retail store) write blood pressure results in a log or diary keep a blood pressure log take blood pressure log to all doctor appointments keep all doctor appointments take medications for blood pressure exactly as prescribed eat more whole grains, fruits and vegetables, lean meats and healthy fats Patient has SpO2 sensor and blood pressure monitor at home Continue with diet modifications patient has initiated:  More vegetables and less carbohydrates in diet.  Has been reading up on how to lower her A1c (7.1) and is motivated to change habits,  Start Ozempic  on 12/09/24.   Plan:  Next TOC follow up call: Tuesday 12/11/24 at 2 pm by telephone.         Patient verbalizes understanding of instructions and care plan provided today and  agrees to view in [place]. Active MyChart status and patient understanding of how to access instructions and care plan via MyChart confirmed with patient.     Telephone follow up appointment with care management team member scheduled for: Our next appointment is by telephone on 12/11/24  at 2 pm  Please call the care guide team at [phone] if you need to cancel or reschedule your appointment.    Please call the [place]  National Suicide Prevention Lifeline: (504) 143-0368 or TTY: 7044814632 TTY 425-248-3593) to talk to a trained counselor call 1-800-273-TALK (toll free, 24 hour hotline) if you are experiencing a Mental Health or Behavioral Health Crisis or need someone to talk to.   Bing Edison MSN, RN RN [employer]  VBCI-Population Health Office Hours M-F [phone] Direct Dial: [phone] Main Phone [phone]  Fax: [phone] [email or web address]

## 2024-12-04 NOTE — Telephone Encounter (Signed)
 Patient calls nurse line requesting a referral to Pulmonology.   She reports a recent hospitalization for asthma. However, she denies having a specialist.   Advised will forward to PCP.

## 2024-12-07 NOTE — Addendum Note (Signed)
 Addended by: [name] on: 12/07/2024 01:18 PM   Modules accepted: Orders

## 2024-12-10 ENCOUNTER — Telehealth: Payer: Self-pay | Admitting: Pharmacist

## 2024-12-10 NOTE — Telephone Encounter (Signed)
 Patient contacted for follow-up of need to schedule for PFT  Since last contact patient reports improved breathing with use of prednisone .  Patient denies any significant medication related side effects.  Medication Plan:  No change prior to PFT, scheduled today for 12/26/2024 at 10:30  Total time with patient call and documentation of interaction: 8 minutes.

## 2024-12-19 ENCOUNTER — Other Ambulatory Visit: Payer: Self-pay | Admitting: Family Medicine

## 2024-12-19 DIAGNOSIS — I10 Essential (primary) hypertension: Secondary | ICD-10-CM

## 2024-12-24 NOTE — Progress Notes (Signed)
 CAMRI MOLLOY                                          MRN: [id number]   12/24/2024   The VBCI Quality Team Specialist reviewed this patient medical record for the purposes of chart review for care gap closure. The following were reviewed: abstraction for care gap closure-glycemic status assessment.    VBCI Quality Team

## 2024-12-26 ENCOUNTER — Telehealth: Payer: Self-pay

## 2024-12-26 ENCOUNTER — Ambulatory Visit (INDEPENDENT_AMBULATORY_CARE_PROVIDER_SITE_OTHER): Admitting: Pharmacist

## 2024-12-26 ENCOUNTER — Encounter: Payer: Self-pay | Admitting: Pharmacist

## 2024-12-26 VITALS — Ht 66.0 in | Wt 249.0 lb

## 2024-12-26 DIAGNOSIS — J4520 Mild intermittent asthma, uncomplicated: Secondary | ICD-10-CM

## 2024-12-26 NOTE — Assessment & Plan Note (Signed)
 Patient reports history of asthma symptoms which started post-menopause. Reports nearly complete recovery   Medication adherence appropriate PRN use of albuterol .  Spirometry evaluation without bronchodilator reveals normal lung function.   - Continue albuterol  2 puffs PRN -Educated patient on purpose, proper use of albuterol  inhaler.

## 2024-12-26 NOTE — Progress Notes (Signed)
" ° °  S:     No chief complaint on file.  69 y.o. female who presents for respiratory evaluation, education, and management. Patient arrives in good spirits and presents with assistance of a cane.  Patient was referred and last seen by Primary Care Provider, Dr. Porfirio, on 11/21/2024.  At last visit, patient was recovering from recent respiratory exacerbation requiring hospitalization.   PMH is significant for asthma - starting after menopause.  Reports multiple allergens (Spring) are exacerbating factors to her breathing.   Patient reports breathing has been excellent for the last several days.  Patient reports atopic sx consistent with seasonal (allergic rhinitis).  Patient reports adherence to medications Patient reports last dose of asthma medications was several days ago. Current asthma medications: albuterol  HFA, albuterol  nebulizer  Rescue inhaler use frequency: seasonal - associated with high pollen count during allergy season; only uses nebulizer when she feels she is getting a cold   O: Review of Systems  Respiratory:  Negative for sputum production, shortness of breath and wheezing.   All other systems reviewed and are negative.   Physical Exam Vitals reviewed.  Constitutional:      Appearance: Normal appearance.  Pulmonary:     Effort: Pulmonary effort is normal.  Neurological:     Mental Status: She is alert.  Psychiatric:        Mood and Affect: Mood normal.        Behavior: Behavior normal.        Thought Content: Thought content normal.        Judgment: Judgment normal.    See scanned report or Documentation Flowsheet (discrete results - PFTs) for  Spirometry results. Patient provided good effort while attempting spirometry.  Lung Age = 9  Patient is participating in a Managed Medicaid Plan:  Yes   A/P: Patient reports history of asthma symptoms which started post-menopause. Reports nearly complete recovery   Medication adherence appropriate PRN use of  albuterol .  Spirometry evaluation without bronchodilator reveals normal lung function.   - Continue albuterol  2 puffs PRN -Educated patient on purpose, proper use of albuterol  inhaler.   Reviewed results of pulmonary function tests.  Patient verbalized understanding of treatment plan.  Written patient instructions provided.  Total time in face to face counseling 34 minutes.    Follow-up:  Pharmacist - no planned return at this time.  PCP clinic visit TBD  Patient seen with Sabra Schuller, PharmD Candidate - PY2 student and Megan McGill, PharmD Candidate - PY4 student.    "

## 2024-12-26 NOTE — Transitions of Care (Post Inpatient/ED Visit) (Signed)
 12/26/2024  Patient ID: Roberta [name], female   DOB: 1956/02/09, 70 y.o.   MRN: [id number]  TOC week 4 follow up call post Holiday check in. Unsuccessful outreach, left a confidential voice mail on DPR contact and will reach out again for follow up call.    Bing Edison MSN, RN RN Case [employer]  [facility]Population Health Office Hours M-F [phone] Direct Dial: [phone] Main Phone [phone]  Fax: [phone] [email or web address]

## 2024-12-26 NOTE — Patient Instructions (Signed)
 Nice to see you today!  Your breathing test showed your lung function is normal!    Medication Changes: - Continue to use your Albuterol  Inhaler when needed - Continue all other medication the same.  Please bring all medications to your clinic visits.  Please arrive 10-15 minutes prior to your scheduled visit time.

## 2024-12-27 NOTE — Progress Notes (Signed)
 Reviewed and agree with Dr Rennis plan.

## 2024-12-28 ENCOUNTER — Other Ambulatory Visit: Payer: Self-pay

## 2024-12-28 NOTE — Transitions of Care (Post Inpatient/ED Visit) (Signed)
 " Transition of Care week 4  Visit Note  12/28/2024  Name: Roberta [name] MRN: [id number]          DOB: 1956/08/24  Situation: Patient enrolled in [facility] 30-day program. Visit completed with patient by telephone.  Completed 30 day program without admission.   Background:   Initial Transition Care Management Follow-up Telephone Call Discharge Date and Diagnosis: 11/18/24, Asthma exacerbation   Past Medical History:  Diagnosis Date   Abnormal EKG    nonspecific ST and T-wave changes - Followed by Dr.Berry   ALLERGIC RHINITIS 10/14/2010   Anemia    Asthma    ASTHMA, INTERMITTENT 02/16/2007   Atypical endometrial hyperplasia 07/28/2015   BACK PAIN, CHRONIC 03/25/2009   CERVICAL RADICULOPATHY 10/16/2009   DDD (degenerative disc disease), lumbosacral    Depression    DEPRESSIVE DISORDER NOT ELSEWHERE CLASSIFIED 03/25/2009   Endometrial cancer (HCC)    GERD (gastroesophageal reflux disease) 09/09/2011   Glaucoma    sees optho every 3 months, drops each night   Hyperlipidemia    Hypertension    Localized swelling, mass or lump of neck 10/26/2018   PONV (postoperative nausea and vomiting)    Postmenopausal vaginal bleeding 09/24/2014   Viral URI with cough 03/28/2013    Assessment: Patient Reported Symptoms: Cognitive Cognitive Status: No symptoms reported, Insightful and able to interpret abstract concepts, Normal speech and language skills      Neurological Neurological Review of Symptoms: No symptoms reported    HEENT HEENT Symptoms Reported: No symptoms reported      Cardiovascular Cardiovascular Symptoms Reported: No symptoms reported Does patient have uncontrolled Hypertension?: No Do You Have a Working Readable Scale?: Yes Weight: 245 lb (111.1 kg) (Patient reported.) Cardiovascular Self-Management Outcome: 4 (good) Cardiovascular Comment: BP reported as 118/70's.  Respiratory Respiratory Symptoms Reported: No symptoms reported Other Respiratory Symptoms: NO  wheezing, shortness of breath or stuffiness today, has not had to use PRN inhalers at all. Additional Respiratory Details: [phone] Respiratory Management Strategies: Activity, Adequate rest, Asthma action plan, Coping strategies, Medication therapy, Routine screening, Weight management Respiratory Self-Management Outcome: 4 (good)  Endocrine Endocrine Symptoms Reported: No symptoms reported Is patient diabetic?:  (Pending repeat A1C in 3 months as was on steroids in hospitalization.) Endocrine Comment: Started Oxempic 12/17/24 on her birthday, has reported 10lb weight loss.  Gastrointestinal Gastrointestinal Symptoms Reported: Other Other Gastrointestinal Symptoms: Discussed/reviewed side effects of Ozempic  and she is taking prune juice daily as noticed a slow down in frequency but not constipated. She is aware of this as a risk and takng precautions. Gastrointestinal Management Strategies: Activity, Diet modification, Exercise, Medication therapy Gastrointestinal Self-Management Outcome: 4 (good)    Genitourinary Genitourinary Symptoms Reported: Other, No symptoms reported Genitourinary Management Strategies: Activity, Coping strategies, Diet modification, Medication therapy Genitourinary Self-Management Outcome: 4 (good)  Integumentary Integumentary Symptoms Reported: No symptoms reported Skin Self-Management Outcome: 4 (good)  Musculoskeletal Musculoskelatal Symptoms Reviewed: Joint pain Additional Musculoskeletal Details: Left Shoulder and Right hip pain at night. Is being followed for these. Musculoskeletal Comment: History of back pain, no surgery, takes medication when needed. Falls in the past year?: No    Psychosocial Psychosocial Symptoms Reported: No symptoms reported         Today's Vitals   12/28/24 1018  BP: 118/70  Weight: 245 lb (111.1 kg)   Pain Scale: 0-10 Pain Type: Chronic pain Pain Location: Shoulder Pain Orientation: Left Pain Descriptors / Indicators:  Tender, Burning Patients Stated Pain Goal: 2 Pain Intervention(s):  (Patient has been  told she has a 90% rotator cuff tear and will need surgery when stablized and loses some more weight per patient reported.)  Medications Reviewed Today     Reviewed by Carolee Heron NOVAK, RN (Case Manager) on 12/28/24 at 1001  Med List Status: <None>   Medication Order Taking? Sig Documenting Provider Last Dose Status Informant  albuterol  (PROVENTIL ) (2.5 MG/3ML) 0.083% nebulizer solution [id number] Yes Take 3 mLs (2.5 mg total) by nebulization every 6 (six) hours as needed for wheezing or shortness of breath. Arlon Carliss ORN, DO  Active   albuterol  (VENTOLIN  HFA) 108 [phone] Base) MCG/ACT inhaler [id number] Yes Inhale 2 puffs into the lungs every 6 (six) hours as needed for wheezing or shortness of breath. Arlon Carliss ORN, DO  Active   apixaban  (ELIQUIS ) 2.5 MG TABS tablet [id number] Yes Take 1 tablet (2.5 mg total) by mouth 2 (two) times daily. [name] ONEIDA MADISON, MD  Active Self, Pharmacy Records  BIOTIN PO [id number] Yes Take 1 capsule by mouth daily. [provider]  Active Self, Pharmacy Records  cetirizine  (ZYRTEC ) 10 MG tablet [id number] Yes TAKE 1 TABLET(10 MG) BY MOUTH DAILY  Patient taking differently: Take 10 mg by mouth daily as needed for allergies.   [name], DO  Active Self, Pharmacy Records  cyclobenzaprine  (FLEXERIL ) 10 MG tablet [id number] Yes Take 10 mg by mouth daily as needed for muscle spasms. [provider]  Active Self, Pharmacy Records           Med Note [name], [name]   Fri Nov 16, 2024  5:49 PM)    dorzolamide -timolol  (COSOPT ) 22.3-6.8 MG/ML ophthalmic solution [id number] Yes Place 1 drop into both eyes 2 (two) times daily. Rennie Toribio CROME, MD  Active Self, Pharmacy Records  esomeprazole  (NEXIUM ) 20 MG capsule [id number] Yes TAKE 1 CAPSULE(20 MG) BY MOUTH DAILY AS NEEDED FOR STOMACH ACID OR REFLUX [name], MD  Active Self, Pharmacy Records  meclizine   (ANTIVERT ) 12.5 MG tablet [id number] Yes TAKE 1 TABLET(12.5 MG) BY MOUTH THREE TIMES DAILY AS NEEDED [name], DO  Active Self, Pharmacy Records           Med Note SYDELL HERON NOVAK Pablo Nov 19, 2024 12:40 PM) Emily as needed.   Multiple Vitamins-Minerals (EMERGEN-C IMMUNE PO) [id number] Yes Take 1 packet by mouth as needed. [provider]  Active Self, Pharmacy Records  olmesartan -hydrochlorothiazide  (BENICAR  HCT) 20-12.5 MG tablet [id number] Yes TAKE 1 TABLET BY MOUTH DAILY Quillen, Michael, MD  Active   ondansetron  (ZOFRAN ) 4 MG tablet [id number] Yes TAKE 1 TABLET BY MOUTH EVERY 8 HOURS AS NEEDED FOR NAUSEA OR VOMITING [name], DO  Active Self, Pharmacy Records  rosuvastatin  (CRESTOR ) 10 MG tablet [id number] Yes Take 1 tablet (10 mg total) by mouth daily. [name], MD  Active Self, Pharmacy Records  Semaglutide ,0.25 or 0.5MG /DOS, 2 MG/1.5ML NELMA [id number] Yes Inject 0.25 mg into the skin once a week. 0.25 mg once weekly for 4 weeks then increase to 0.5 mg weekly for at least 4 weeks,max 1 mg Quillen, Michael, MD  Active   traMADol  (ULTRAM ) 50 MG tablet [id number] Yes Take 1 tablet (50 mg total) by mouth every 6 (six) hours as needed. [name], MD  Active Self, Pharmacy Records  Travoprost , BAK Free, (TRAVATAN ) 0.004 % SOLN ophthalmic solution [id number] Yes Place 1 drop into both eyes at bedtime. [name], MD  Active Self, Pharmacy Records  Zoster Vaccine Adjuvanted (SHINGRIX ) injection [id number]  Administer Shingrix  vaccination  now and repeat in two months  Patient not taking: Reported on 12/28/2024   [name], MD  Active           Patient is aware and has been in contact with payor benefits.   Recommendation:   Continue Current Plan of Care  Follow Up Plan:   Patient has met all care management goals. Care Management case will be closed. Patient has been provided contact information should new needs arise.    Bing Edison MSN, RN RN Case  [employer]  [facility]BCI-Population Health Office Hours M-F [phone] Direct Dial: [phone] Main Phone [phone]  Fax: [phone] [email or web address]       "

## 2024-12-28 NOTE — Patient Instructions (Signed)
 Visit Information  Thank you for taking time to visit with me today and since your discharge. Please don't hesitate to contact me if I can be of assistance to you before our next scheduled telephone appointment.  Keep up the good work!     Following is a copy of your care plan:   Goals Addressed             This Visit's Progress    VBCI Transitions of Care (TOC) Care Plan   On track    Reviewed on 12/26/2024 TOC week #4 successful outreach call with patient:   Problems:  Recent Hospitalization for treatment of Asthma, HTN, and History of DVT on Eliquis  Knowledge Deficit Related to Asthma Exacerbation, self care for HTN and DVT prevention/knowledge.   Goal:  Over the next 30 days, the patient will not experience hospital readmission Completed!  Interventions:   Asthma: Provided patient with basic written and verbal Asthma education on self care/management/and exacerbation prevention Advised patient to track and manage Asthma triggers: seasonal or cold weather.  Provided instruction about proper use of medications used for management of Asthma including inhalers and prednisone .  Discussed potential for increase in blood sugar while on tapering steroids.  Per PCP notes A1C 7.1 and patient will start on Ozempic  12/09/24 with follow up in 3 months. On 12/28/24 patient reports she has lost 10 lbs since 10/17/2024 when she first started the Ozempic , which was her birthday so it made it easier for her to remember when she started it.  Completed prednisone  taper on 11/30/24 but is on daily dose as well  Will ask about CBG or glucometer supplies on next PCP visit.  12/28/2024: Not needed till A1C recheck in March per PCP.  Has scale at home.  Reviewed tapering schedule with patient and she has a written plan to help due to dyslexia. (Completed on 11/30/24) Reviewed: patient to self assesses Asthma action plan zone and make appointment with provider if in the yellow zone for 48 hours without  improvement. Discussed/review triggers: for patient it is seasonal weather changes.  Patient has action plan and used teach back to review actions she takes when inhalers and nebulizer do not provider relief.  12/28/2024 patient has not had to use prn inhalers at all in past few weeks.  Encouraged to place phone numbers and action plan where family members or friends can see in an emergency and rationale.  Nephew has unlock code to her phone and instructed on action plan and how to call 911.  Provided education about and advised patient to utilize infection prevention strategies to reduce risk of respiratory infection. Patient to start wearing mask in cold weather and ear muffs due to crystals in ears triggering symptoms.  12/9/25[name] Sous out today for some errands, wore a mask, and stated she did well, much better than last week.  12/04/24: Continues to improve with activity and precautions without an episode.  12/28/2024: No episodes reported, patient stated not having to use inhales in a few weeks.  Discussed the importance of adequate rest and management of fatigue with Asthma:  Patient has good self care management on review.  Screening for signs and symptoms of depression related to chronic disease state:  Completed.  Assessed social determinant of health barriers:  Completed.  Directed patient to website for printable Action Plan by [employer] by DHHS and NIH at [employer] and discuss with PCP for any changes needed to patient's needs/plan.   Generic and DVT Interventions:  Evaluation of current treatment plan related to Asthma, HTN, and DVT prevention,  self-management and patient's adherence to plan as established by provider. Discussed plans with patient for ongoing care management follow up and provided patient with direct contact information for care management team Evaluation of current treatment plan related to Asthma exacerbation and  patient's adherence to plan as established by provider Advised patient to provide appropriate vaccination information to provider or care management team member at next visit Provided education to patient re: Care Gaps to discuss with PCP, including vaccinations as patient allergic to flu vaccine, education on Shingles vaccine with history of chickenpox as a child to review with PCP.  Does not want the Shingles Vaccine!  Reviewed medications with patient and discussed Precaution to watch for on Eliquis , reviewed all medications and inhalers/nebulizers.  Reviewed scheduled/upcoming provider appointments including PCP appointment in 11/21/24. Completed.  Discussed plans with patient for ongoing care management follow up and provided patient with direct contact information for care management team Advised patient to discuss Care Gaps noted and discussed with provider including annual wellness visit and labwork while in Eliquis  PCP follows every 3 months.  Discussed prior DVT's due to long travel one time and dehydration due to flu another time.  Reviewed preventative measures, Teach back on signs and symptoms of DVT and what to call provider for.  Eliquis  medication review with teach back.  12/28/2024:  Denies any falls, bleeding, or bruising. No signs or symptoms  reported of recurrent DVT.   Hypertension Interventions: Discussed starting to keep a log to record blood pressures at home with equipment in place for capturing blood pressure trends to take to providers.  Last practice recorded BP readings:  12/28/2024: 118/70's PCP office visit 125/75 Patient has equipment to check blood pressure but only does when she feels it is too low or too high, I can just tell.  BP Readings from Last 3 Encounters:  12/04/24 124/78  11/21/24 125/75  11/18/24 136/80   Most recent eGFR/CrCl:  Lab Results  Component Value Date   EGFR 61 01/09/2024    No components found for: CRCL  Evaluation of current  treatment plan related to hypertension self management and patient's adherence to plan as established by provider Reviewed medications with patient and discussed importance of compliance Discussed plans with patient for ongoing care management follow up and provided patient with direct contact information for care management team Advised patient, providing education and rationale, to monitor blood pressure daily and record, calling PCP for findings outside established parameters Provided education on prescribed diet Low sodium/cardiac/heart healthy.  Patient has started eating Healthier and adding more vegetables and less carbohydrates.  Screening for signs and symptoms of depression related to chronic disease state  Assessed social determinant of health barriers  Patient Self Care Activities:  Attend all scheduled provider appointments Call pharmacy for medication refills 3-7 days in advance of running out of medications Call provider office for new concerns or questions  Participate in Transition of Care Program/Attend TOC scheduled calls Take medications as prescribed   check blood pressure 3 times per week choose a place to take my blood pressure (home, clinic or office, retail store) write blood pressure results in a log or diary keep a blood pressure log take blood pressure log to all doctor appointments keep all doctor appointments take medications for blood pressure exactly as prescribed eat more whole grains, fruits and vegetables, lean meats and healthy fats Patient has SpO2 sensor and blood pressure monitor  at home Continue with diet modifications patient has initiated:  More vegetables and less carbs in diet.  Has been reading up on how to lower her A1c (7.1) and is motivated to change habits,  Start Ozempic  on 12/17/2024 and has lost 10 lbs as of 12/28/2024. Keep PCP appointment in 3 months to follow up on A1C (was 7.1).  Keep up the good work!  Plan:  Goals met for TOC  30 day program, informed patient, she stated she appreciated all the check ins, and seem very motivated to take charge of her self care management.         Patient verbalizes understanding of instructions and care plan provided today and agrees to view in MyChart. Active MyChart status and patient understanding of how to access instructions and care plan via MyChart confirmed with patient.     Patient has completed TOC 30 Day program without any admissions.   Please call the care guide team at [phone] if you need to cancel or reschedule your appointment.   Please call the [place]  National Suicide Prevention Lifeline: (234)436-0957 or TTY: 206-575-4385 TTY 612-366-6226) to talk to a trained counselor call 1-800-273-TALK (toll free, 24 hour hotline) if you are experiencing a Mental Health or Behavioral Health Crisis or need someone to talk to.   Roberta Edison MSN, RN RN Case [employer] [facility]  VBCI-Population Health Office Hours M-F [phone] Direct Dial: [phone] Main Phone [phone]  Fax: [phone] [email or web address]

## 2024-12-31 ENCOUNTER — Ambulatory Visit

## 2025-01-09 ENCOUNTER — Encounter: Payer: Self-pay | Admitting: Family Medicine

## 2025-01-09 DIAGNOSIS — K219 Gastro-esophageal reflux disease without esophagitis: Secondary | ICD-10-CM

## 2025-01-09 MED ORDER — ESOMEPRAZOLE MAGNESIUM 20 MG PO CPDR: DELAYED_RELEASE_CAPSULE | ORAL | 0 refills | Status: AC

## 2025-01-17 ENCOUNTER — Ambulatory Visit: Payer: Self-pay | Admitting: Family Medicine

## 2025-01-20 ENCOUNTER — Other Ambulatory Visit: Payer: Self-pay | Admitting: Family Medicine

## 2025-01-20 DIAGNOSIS — E119 Type 2 diabetes mellitus without complications: Secondary | ICD-10-CM

## 2025-01-21 ENCOUNTER — Encounter: Payer: Self-pay | Admitting: Family Medicine

## 2025-01-23 ENCOUNTER — Ambulatory Visit: Payer: Self-pay | Admitting: Family Medicine

## 2025-02-15 ENCOUNTER — Ambulatory Visit: Admitting: Hematology and Oncology

## 2025-02-15 ENCOUNTER — Other Ambulatory Visit

## 2025-03-05 ENCOUNTER — Ambulatory Visit: Admitting: Family Medicine

## 2025-05-16 ENCOUNTER — Encounter
